# Patient Record
Sex: Female | Born: 1950 | Race: Black or African American | Hispanic: No | Smoking: Former smoker
Health system: Southern US, Community
[De-identification: ages and names within clinical notes are randomized; demographics above are authoritative.]

## PROBLEM LIST (undated history)

## (undated) DIAGNOSIS — K5792 Diverticulitis of intestine, part unspecified, without perforation or abscess without bleeding: Secondary | ICD-10-CM

## (undated) DIAGNOSIS — K6389 Other specified diseases of intestine: Secondary | ICD-10-CM

## (undated) DIAGNOSIS — I350 Nonrheumatic aortic (valve) stenosis: Secondary | ICD-10-CM

## (undated) DIAGNOSIS — R011 Cardiac murmur, unspecified: Secondary | ICD-10-CM

## (undated) DIAGNOSIS — Z951 Presence of aortocoronary bypass graft: Secondary | ICD-10-CM

## (undated) DIAGNOSIS — I10 Essential (primary) hypertension: Secondary | ICD-10-CM

## (undated) DIAGNOSIS — K219 Gastro-esophageal reflux disease without esophagitis: Secondary | ICD-10-CM

## (undated) DIAGNOSIS — K760 Fatty (change of) liver, not elsewhere classified: Secondary | ICD-10-CM

## (undated) DIAGNOSIS — M1990 Unspecified osteoarthritis, unspecified site: Secondary | ICD-10-CM

## (undated) DIAGNOSIS — I2510 Atherosclerotic heart disease of native coronary artery without angina pectoris: Secondary | ICD-10-CM

## (undated) DIAGNOSIS — G629 Polyneuropathy, unspecified: Secondary | ICD-10-CM

## (undated) DIAGNOSIS — D649 Anemia, unspecified: Secondary | ICD-10-CM

## (undated) DIAGNOSIS — N183 Chronic kidney disease, stage 3 unspecified: Secondary | ICD-10-CM

## (undated) DIAGNOSIS — E119 Type 2 diabetes mellitus without complications: Secondary | ICD-10-CM

## (undated) DIAGNOSIS — N189 Chronic kidney disease, unspecified: Secondary | ICD-10-CM

## (undated) DIAGNOSIS — E785 Hyperlipidemia, unspecified: Secondary | ICD-10-CM

## (undated) HISTORY — PX: BREAST BIOPSY: SHX20

## (undated) HISTORY — PX: ABDOMINAL HYSTERECTOMY: SHX81

## (undated) HISTORY — PX: CARDIAC SURGERY: SHX584

## (undated) HISTORY — PX: HERNIA REPAIR: SHX51

## (undated) HISTORY — PX: BREAST EXCISIONAL BIOPSY: SUR124

## (undated) HISTORY — PX: CORONARY ARTERY BYPASS GRAFT: SHX141

## (undated) HISTORY — PX: CARPAL TUNNEL RELEASE: SHX101

---

## 1997-09-28 ENCOUNTER — Other Ambulatory Visit: Admission: RE | Admit: 1997-09-28 | Discharge: 1997-09-28 | Payer: Self-pay | Admitting: Urology

## 1998-10-08 ENCOUNTER — Inpatient Hospital Stay (HOSPITAL_COMMUNITY): Admission: EM | Admit: 1998-10-08 | Discharge: 1998-10-10 | Payer: Self-pay | Admitting: Emergency Medicine

## 1998-10-08 ENCOUNTER — Encounter: Payer: Self-pay | Admitting: Emergency Medicine

## 1998-10-12 ENCOUNTER — Encounter: Admission: RE | Admit: 1998-10-12 | Discharge: 1998-10-12 | Payer: Self-pay | Admitting: Family Medicine

## 1999-03-13 ENCOUNTER — Other Ambulatory Visit: Admission: RE | Admit: 1999-03-13 | Discharge: 1999-03-13 | Payer: Self-pay | Admitting: Urology

## 1999-04-03 ENCOUNTER — Other Ambulatory Visit: Admission: RE | Admit: 1999-04-03 | Discharge: 1999-04-03 | Payer: Self-pay | Admitting: Family Medicine

## 1999-04-28 ENCOUNTER — Encounter: Admission: RE | Admit: 1999-04-28 | Discharge: 1999-04-28 | Payer: Self-pay | Admitting: Family Medicine

## 1999-12-05 ENCOUNTER — Encounter: Admission: RE | Admit: 1999-12-05 | Discharge: 1999-12-05 | Payer: Self-pay | Admitting: Family Medicine

## 1999-12-05 ENCOUNTER — Encounter: Payer: Self-pay | Admitting: Family Medicine

## 2000-08-19 ENCOUNTER — Other Ambulatory Visit: Admission: RE | Admit: 2000-08-19 | Discharge: 2000-08-19 | Payer: Self-pay | Admitting: Family Medicine

## 2000-12-17 ENCOUNTER — Encounter: Admission: RE | Admit: 2000-12-17 | Discharge: 2000-12-17 | Payer: Self-pay | Admitting: Family Medicine

## 2000-12-17 ENCOUNTER — Encounter: Payer: Self-pay | Admitting: Family Medicine

## 2002-02-17 ENCOUNTER — Encounter: Admission: RE | Admit: 2002-02-17 | Discharge: 2002-02-17 | Payer: Self-pay | Admitting: Family Medicine

## 2002-02-17 ENCOUNTER — Encounter: Payer: Self-pay | Admitting: Family Medicine

## 2002-02-21 ENCOUNTER — Other Ambulatory Visit: Admission: RE | Admit: 2002-02-21 | Discharge: 2002-02-21 | Payer: Self-pay | Admitting: Family Medicine

## 2003-04-06 ENCOUNTER — Encounter: Admission: RE | Admit: 2003-04-06 | Discharge: 2003-04-06 | Payer: Self-pay | Admitting: Family Medicine

## 2003-09-26 ENCOUNTER — Other Ambulatory Visit: Payer: Self-pay

## 2003-12-22 ENCOUNTER — Encounter: Admission: RE | Admit: 2003-12-22 | Discharge: 2003-12-22 | Payer: Self-pay | Admitting: Family Medicine

## 2003-12-28 ENCOUNTER — Emergency Department (HOSPITAL_COMMUNITY): Admission: EM | Admit: 2003-12-28 | Discharge: 2003-12-28 | Payer: Self-pay | Admitting: Emergency Medicine

## 2004-01-07 ENCOUNTER — Other Ambulatory Visit: Admission: RE | Admit: 2004-01-07 | Discharge: 2004-01-07 | Payer: Self-pay | Admitting: Family Medicine

## 2004-05-09 ENCOUNTER — Encounter: Admission: RE | Admit: 2004-05-09 | Discharge: 2004-05-09 | Payer: Self-pay | Admitting: Family Medicine

## 2005-02-03 ENCOUNTER — Other Ambulatory Visit: Admission: RE | Admit: 2005-02-03 | Discharge: 2005-02-03 | Payer: Self-pay | Admitting: Family Medicine

## 2005-03-25 ENCOUNTER — Other Ambulatory Visit: Payer: Self-pay

## 2005-03-25 ENCOUNTER — Inpatient Hospital Stay: Payer: Self-pay | Admitting: Internal Medicine

## 2005-03-26 ENCOUNTER — Other Ambulatory Visit: Payer: Self-pay

## 2005-06-12 ENCOUNTER — Encounter: Admission: RE | Admit: 2005-06-12 | Discharge: 2005-06-12 | Payer: Self-pay | Admitting: Family Medicine

## 2006-02-02 ENCOUNTER — Ambulatory Visit: Payer: Self-pay | Admitting: Gastroenterology

## 2006-05-06 ENCOUNTER — Other Ambulatory Visit: Admission: RE | Admit: 2006-05-06 | Discharge: 2006-05-06 | Payer: Self-pay | Admitting: Family Medicine

## 2006-09-28 ENCOUNTER — Ambulatory Visit: Payer: Self-pay

## 2007-10-06 ENCOUNTER — Other Ambulatory Visit: Admission: RE | Admit: 2007-10-06 | Discharge: 2007-10-06 | Payer: Self-pay | Admitting: Family Medicine

## 2007-10-20 ENCOUNTER — Ambulatory Visit: Payer: Self-pay

## 2008-10-09 ENCOUNTER — Ambulatory Visit: Payer: Self-pay

## 2008-10-22 ENCOUNTER — Ambulatory Visit: Payer: Self-pay

## 2009-07-01 ENCOUNTER — Ambulatory Visit: Payer: Self-pay | Admitting: Specialist

## 2009-10-24 ENCOUNTER — Ambulatory Visit: Payer: Self-pay

## 2010-10-29 ENCOUNTER — Ambulatory Visit: Payer: Self-pay | Admitting: Family Medicine

## 2011-07-03 ENCOUNTER — Ambulatory Visit: Payer: Self-pay | Admitting: Gastroenterology

## 2011-09-30 ENCOUNTER — Ambulatory Visit: Payer: Self-pay | Admitting: Ophthalmology

## 2011-09-30 DIAGNOSIS — I10 Essential (primary) hypertension: Secondary | ICD-10-CM

## 2011-10-14 ENCOUNTER — Ambulatory Visit: Payer: Self-pay | Admitting: Ophthalmology

## 2011-10-21 ENCOUNTER — Inpatient Hospital Stay: Payer: Self-pay | Admitting: Internal Medicine

## 2011-10-21 LAB — URINALYSIS, COMPLETE
Bilirubin,UR: NEGATIVE
Hyaline Cast: 34
Protein: 30
RBC,UR: 7 /HPF (ref 0–5)
Squamous Epithelial: 12
WBC UR: 34 /HPF (ref 0–5)

## 2011-10-21 LAB — COMPREHENSIVE METABOLIC PANEL
BUN: 29 mg/dL — ABNORMAL HIGH (ref 7–18)
Bilirubin,Total: 0.4 mg/dL (ref 0.2–1.0)
EGFR (Non-African Amer.): 24 — ABNORMAL LOW
Osmolality: 275 (ref 275–301)
SGPT (ALT): 49 U/L
Sodium: 134 mmol/L — ABNORMAL LOW (ref 136–145)
Total Protein: 8.4 g/dL — ABNORMAL HIGH (ref 6.4–8.2)

## 2011-10-21 LAB — CBC
HGB: 13.1 g/dL (ref 12.0–16.0)
MCH: 29.8 pg (ref 26.0–34.0)
MCHC: 32.5 g/dL (ref 32.0–36.0)
Platelet: 175 10*3/uL (ref 150–440)
RBC: 4.39 10*6/uL (ref 3.80–5.20)

## 2011-10-21 LAB — TROPONIN I: Troponin-I: <0.02 ng/mL

## 2011-10-21 LAB — CK TOTAL AND CKMB (NOT AT ARMC): CK-MB: 1.5 ng/mL (ref 0.5–3.6)

## 2011-10-22 DIAGNOSIS — I059 Rheumatic mitral valve disease, unspecified: Secondary | ICD-10-CM

## 2011-10-22 LAB — CK TOTAL AND CKMB (NOT AT ARMC)
CK, Total: 187 U/L (ref 21–215)
CK-MB: 1.6 ng/mL (ref 0.5–3.6)

## 2011-10-22 LAB — BASIC METABOLIC PANEL
BUN: 30 mg/dL — ABNORMAL HIGH (ref 7–18)
Calcium, Total: 8.1 mg/dL — ABNORMAL LOW (ref 8.5–10.1)
Creatinine: 1.38 mg/dL — ABNORMAL HIGH (ref 0.60–1.30)
Osmolality: 287 (ref 275–301)

## 2011-10-22 LAB — LIPID PANEL
Cholesterol: 109 mg/dL (ref 0–200)
HDL Cholesterol: 16 mg/dL — ABNORMAL LOW (ref 40–60)
Ldl Cholesterol, Calc: 48 mg/dL (ref 0–100)
VLDL Cholesterol, Calc: 45 mg/dL — ABNORMAL HIGH (ref 5–40)

## 2011-10-22 LAB — CBC WITH DIFFERENTIAL/PLATELET
Basophil #: 0 10*3/uL (ref 0.0–0.1)
Basophil %: 0.9 %
Eosinophil #: 0 10*3/uL (ref 0.0–0.7)
Eosinophil %: 0.7 %
Lymphocyte #: 2.1 10*3/uL (ref 1.0–3.6)
MCV: 91 fL (ref 80–100)

## 2011-10-22 LAB — HEMOGLOBIN A1C: Hemoglobin A1C: 6.2 % (ref 4.2–6.3)

## 2011-10-27 LAB — CULTURE, BLOOD (SINGLE)

## 2011-11-06 ENCOUNTER — Ambulatory Visit: Payer: Self-pay | Admitting: Family Medicine

## 2012-01-24 ENCOUNTER — Emergency Department: Payer: Self-pay | Admitting: *Deleted

## 2012-02-03 ENCOUNTER — Ambulatory Visit: Payer: Self-pay | Admitting: Ophthalmology

## 2012-02-17 ENCOUNTER — Ambulatory Visit: Payer: Self-pay | Admitting: Ophthalmology

## 2012-11-07 ENCOUNTER — Ambulatory Visit: Payer: Self-pay | Admitting: Family Medicine

## 2013-11-08 ENCOUNTER — Ambulatory Visit: Payer: Self-pay | Admitting: Family Medicine

## 2014-09-25 NOTE — Op Note (Signed)
PATIENT NAME:  Renee [name], Renee [name] MR#:  [id number] DATE OF BIRTH:  December 16, 1950  DATE OF PROCEDURE:  02/17/2012  PREOPERATIVE DIAGNOSIS:  Senile cataract, right eye.  POSTOPERATIVE DIAGNOSIS:  Senile cataract, right eye.  PROCEDURE:  Phacoemulsification with posterior chamber intraocular lens implantation of the right eye.  LENS:  ZCBOO 20.0-diopter posterior chamber intraocular lens.  ULTRASOUND TIME:  7% of 35 seconds. CDE 2.6.  SURGEON:  Mali Farah Lepak, MD  ANESTHESIA:  Topical with tetracaine drops and 2% Xylocaine jelly.  COMPLICATIONS:  None.  DESCRIPTION OF PROCEDURE:  The patient was identified in the holding room and transported to the operating room and placed in the supine position under the operating microscope.  The right eye was identified as the operative eye and it was prepped and draped in the usual sterile ophthalmic fashion.  A 1 millimeter clear-corneal paracentesis was made at the 1:30 position.  The anterior chamber was filled with Viscoat viscoelastic.  A 2.4 millimeter keratome was used to make a near-clear corneal incision at the 10:30 position.  A curvilinear capsulorrhexis was made with a cystotome and capsulorrhexis forceps.  Balanced salt solution was used to hydrodissect and hydrodelineate the nucleus.  Phacoemulsification was then used in horizontal chopping fashion to remove the lens nucleus and epinucleus.  The remaining cortex was then removed using the irrigation and aspiration handpiece. Provisc was then placed into the capsular bag to distend it for lens placement.  A ZCBOO 20.0-diopter lens was then injected into the capsular bag.  The remaining viscoelastic was aspirated.  Wounds were hydrated with balanced salt solution.  The anterior chamber was inflated to a physiologic pressure with balanced salt solution.  Miostat was placed into the anterior chamber to constrict the pupil.  No wound leaks were noted.  Topical Vigamox drops and Maxitrol ointment were  applied to the eye.  The patient was taken to the recovery room in stable condition without complications of anesthesia or surgery.  ____________________________ Wyonia Hough, MD crb:cbb D: 02/17/2012 14:41:34 ET T: 02/17/2012 15:06:08 ET JOB#: [id number]  cc: Wyonia Hough, MD, <Dictator> Leandrew Koyanagi MD ELECTRONICALLY SIGNED 02/24/2012 11:16

## 2014-09-30 NOTE — H&P (Signed)
PATIENT NAME:  Renee [name], Renee [name] MR#:  [id number] DATE OF BIRTH:  02/02/1951  DATE OF ADMISSION:  10/21/2011   ADMITTING PHYSICIAN: Gladstone Lighter, MD   PRIMARY MD: Janeece Riggers   PRIMARY CARDIOLOGIST: Dr. Clayborn Bigness    CHIEF COMPLAINT: Headache and dizziness. Was sent in [facility] for hypotension.   HISTORY OF PRESENT ILLNESS: Ms. Kantor is a 64 year old African American female with past medical history significant for coronary artery disease status post bypass graft surgery in 1996, history of hypertension, and non-insulin-dependent diabetes mellitus who had cataract surgery last Thursday about a week ago. She has been having some nausea, headache, and lightheadedness over the past three days. The patient says that she had decreased appetite. She has been nauseous, not drinking that much, that has gradually progressed to the point that she has been extremely weak so she called her ophthalmologist's office and got an appointment for today and went to see him in the office today. At the office she was extremely hypotensive that they were not able to get any manual blood pressure and was sent to the ED. In the ER, the patient's initial blood pressure was 58/37. Her lab work looked normal except for increased BUN and creatinine. She was on Coreg and lisinopril at home which she has been taking over the last couple of days. With 2 liters of fluid, blood pressure now improved up to 80 systolic. Her symptoms have improved a lot. She is sitting talking and not lightheaded. Denies any bleeding, not anemic. No fevers or chills. No white count. It looks more probably hypovolemia as the cause of her hypotension. The patient denies being on any new medications. No use of prednisone or other steroids in the past. No use of any pain medications recently.  PAST MEDICAL HISTORY:  1. Hypertension.  2. Diabetes mellitus.  3. Coronary artery disease status post bypass graft surgery in 1996, stable since then.    PAST SURGICAL HISTORY:  1. Carpal tunnel surgery.  2. Ventral hernia surgery.  3. Partial hysterectomy.  4. Coronary artery bypass graft surgery in 1996.  5. Tubal ligation.  6. Left eye cataract surgery done last week.   ALLERGIES TO MEDICATIONS: Intolerant to codeine which causes nausea.   MEDICATIONS AT HOME: She did not bring the list but was able to tell the medications, not able to tell the doses. 1. Coreg.  2. Metformin.  3. Aspirin.  4. Vitamin D3.  5. Omega-3 fatty acid capsule.  6. Lisinopril.   SOCIAL HISTORY: Lives at home by herself. Intermittent smoker, refuses to give any number. No history of any alcohol or drug use.   FAMILY HISTORY: Father with arthritis. Mom died young during childbirth.  REVIEW OF SYSTEMS: CONSTITUTIONAL: No fever, fatigue. Positive for generalized weakness today. EYES: Status post left eye cataract surgery last week with improvement in vision, still has blurred vision in right eye. No glaucoma. ENT: No tinnitus or ear pain. Positive for moderate hearing loss. No epistaxis or discharge. RESPIRATORY: No cough, wheeze, hemoptysis, or COPD. CARDIOVASCULAR: Denies any chest pain, orthopnea, edema, or palpitations. GI: No nausea, vomiting, diarrhea, abdominal pain, hematemesis, or melena. GU: No dysuria, hematuria. No decreased or increased frequency of urination. ENDOCRINE: No polyuria, nocturia, thyroid problems, heat or cold intolerance. HEMATOLOGY: No anemia, easy bruising or bleeding. SKIN: No acne, rash, or lesions. MUSCULOSKELETAL: No neck pain, shoulder pain, back pain, arthritis, or gout. NEUROLOGIC: No numbness, weakness, CVA, TIA, or seizures. PSYCHOLOGICAL: No anxiety, insomnia, or depression.  PHYSICAL EXAMINATION:   VITAL SIGNS: Temperature 97 degrees Fahrenheit, pulse 96, respirations 20, blood pressure 58/37, after 2 liters of IV fluids blood pressure currently is 83/55, pulse oximetry 95% on room air.   GENERAL: Well built, well  nourished female sitting in bed currently not in any acute distress.   HEENT: Normocephalic, atraumatic. Pupils equal, round, reacting to light. Anicteric sclerae. Extraocular movements intact. Oropharynx clear without erythema, mass, or exudates.   NECK: Supple. No thyromegaly, JVD, or carotid bruits. No lymphadenopathy.   LUNGS: Clear to auscultation bilaterally. No wheeze or crackles. No use of accessory muscles for breathing.   CARDIOVASCULAR: S1, S2 regular rate and rhythm. 3/6 systolic murmur present. No rubs or gallops.   ABDOMEN: Soft, nontender, nondistended. No hepatosplenomegaly. Normal bowel sounds.   EXTREMITIES: No pedal edema. No clubbing or cyanosis. 2+ dorsalis pedis pulses palpable bilaterally.   SKIN: No acne, rash, or lesions.   LYMPHATICS: No cervical lymphadenopathy.   NEUROLOGIC: Cranial nerves intact. No focal motor or sensory deficits.   PSYCH: The patient is awake, alert, oriented x3.   LABORATORY DATA: WBC 5.7, hemoglobin 13.1, hematocrit 40.3, platelet count 175, sodium 134, potassium 3.8, chloride 100, bicarb 24, BUN 29, creatinine 2.1, glucose 110, calcium 9.3, ALT 49, AST 61, alkaline phosphatase 72, total bilirubin 0.4, albumin 3.5. Magnesium 2.0. CK 202. CK-MB 1.5. Troponin less than 0.02.   Chest x-ray showing clear lung fields. No acute disease of the chest. Normal cardiomediastinal silhouette.   CT of the head without contrast showing no acute intracranial process present.   EKG showing normal sinus rhythm, heart rate of 83. No acute ST or T wave abnormalities seen. Nonspecific T wave inversions seen in lead V1 and V2.   ASSESSMENT AND PLAN: This is a 64 year old female with past medical history of hypertension, non-insulin-dependent diabetes mellitus, and coronary artery disease status post bypass graft surgery in 1996 who was sent from ophthalmologist's office for hypotension, unable to get manual blood pressure. She was found to be having a blood  pressure of 58/37 and also acute renal failure here in the hospital.  1. Hypotension. Possible cause could be volume depletion and she continued to take her blood pressure medications. Less likely to be sepsis as no white count, no fevers, and no specific symptoms. She has been nauseous with poor p.o. intake for three days now since her cataract surgery. Blood pressure was improving with fluids. Continue on one more liter of normal saline fluid bolus and start fluids at 125 mL/hour. Will hold off on admission to CCU at this time. Will admit her to telemetry and closely monitor. Hold off on any pressors as patient is asymptomatic at this time. Try to keep the MAP greater than 55. Also, check blood pressure on the other arm to make sure there is no subclavian stenosis on that side. Cortisol level has been ordered to rule out adrenal insufficiency, however, electrolytes are normal. Less likely to be having any active bleeding as she is not anemic. Will recheck labs again.  2. Acute renal failure, likely cause prerenal and continuing to take her lisinopril and metformin. Hold nephrotoxins. Continue IV fluids and follow up in a.m. She will need strict I's and O's monitoring. Get renal ultrasound.  3. Coronary artery disease status post bypass graft surgery, appears stable other than hypotension at this point. Recycle cardiac enzymes. Will be monitored on telemetry. Get an echocardiogram. Does not look like any cardiac tamponade. If persistently hypotensive, not responding to fluids  and has chest pain, need to get a CT of the chest to rule out pericardial effusion with cardiac tamponade.  4. Diabetes mellitus. Hold metformin at this time. Sliding scale insulin and HbA1c.  5. Hypertension. Hold lisinopril and Coreg.  6. GI and DVT prophylaxis with Protonix and TED stockings.   CODE STATUS: FULL CODE.   TIME SPENT ON ADMISSION: 50 minutes.  ____________________________ Gladstone Lighter,  MD rk:drc D: 10/21/2011 21:17:25 ET T: 10/22/2011 07:52:51 ET JOB#: [id number]  cc: Gladstone Lighter, MD, <Dictator> Dwayne D. Clayborn Bigness, MD Gladstone Lighter MD ELECTRONICALLY SIGNED 10/22/2011 13:07

## 2014-09-30 NOTE — Op Note (Signed)
PATIENT NAME:  Renee [name], Renee [name] MR#:  [id number] DATE OF BIRTH:  08-25-1950  DATE OF PROCEDURE:  10/14/2011  PREOPERATIVE DIAGNOSIS:  Senile cataract left eye.  POSTOPERATIVE DIAGNOSIS:  Senile cataract left eye.  PROCEDURE:  Phacoemulsification with posterior chamber intraocular lens implantation of the left eye.  LENS:  ZCB00 20.0-diopter posterior chamber intraocular lens.  ULTRASOUND TIME:  7% of 43 seconds. CDE 2.9.  SURGEON:  Mali Olsen Mccutchan, MD  ANESTHESIA:  Topical with tetracaine drops and 2% Xylocaine jelly.  COMPLICATIONS:  None.  DESCRIPTION OF PROCEDURE:  The patient was identified in the holding room and transported to the operating room and placed in the supine position under the operating microscope.  The left eye was identified as the operative eye and it was prepped and draped in the usual sterile ophthalmic fashion.  A 1 millimeter clear-corneal paracentesis was made at the 1:30 position.  The anterior chamber was filled with Viscoat viscoelastic.  A 2.4 millimeter keratome was used to make a near-clear corneal incision at the 10:30 position.  A curvilinear capsulorrhexis was made with a cystotome and capsulorrhexis forceps.  Balanced salt solution was used to hydrodissect and hydrodelineate the nucleus.  Phacoemulsification was then used in horizontal chopping fashion to remove the lens nucleus and epinucleus.  The remaining cortex was then removed using the irrigation and aspiration handpiece. Provisc was then placed into the capsular bag to distend it for lens placement.  An ZCB00 20.0-diopter lens was then injected into the capsular bag.  The remaining viscoelastic was aspirated.  Wounds were hydrated with balanced salt solution.  The anterior chamber was inflated to a physiologic pressure with balanced salt solution.  Miostat was placed into the anterior chamber to constrict the pupil.  No wound leaks were noted.  Topical Vigamox drops and Maxitrol ointment were applied  to the eye.  The patient was taken to the recovery room in stable condition without complications of anesthesia or surgery.  ____________________________ Wyonia Hough, MD crb:cms D: 10/14/2011 14:17:26 ET T: 10/14/2011 14:42:11 ET JOB#: [id number]  cc: Wyonia Hough, MD, <Dictator> Leandrew Koyanagi MD ELECTRONICALLY SIGNED 10/27/2011 11:32

## 2014-09-30 NOTE — Discharge Summary (Signed)
PATIENT NAME:  Renee [name], Renee [name] MR#:  [id number] DATE OF BIRTH:  1951-01-18  DATE OF ADMISSION:  10/21/2011 DATE OF DISCHARGE:  10/22/2011  ADMISSION DIAGNOSIS: Hypotension.  DISCHARGE DIAGNOSES:  1. Hypotension secondary to volume depletion.  2. Acute renal failure, resolved, secondary to volume depletion.  3. Diabetes.  4. History of coronary artery disease status post CABG and heart murmur.  5. Hypertension.   LABS/STUDIES: Echocardiogram showed ejection fraction of 55%. RVSP is normal. Mild valvular aortic stenosis.   Discharge white blood cells 5.3, hemoglobin 11, hematocrit 34, and platelets 136. Sodium 141, potassium 3.5, chloride 108, bicarbonate 25, BUN 30, creatinine 1.38, glucose 85, and calcium 8.1.   Hemoglobin A1c 6.2. Cholesterol 109, triglycerides 225, HDL 16, VLDL 45, and LDL 48. Troponins are negative.   Blood cultures negative to date.   CT of the head without contrast showed no acute intracranial hemorrhage or cerebrovascular accident.   HISTORY OF PRESENT ILLNESS: The patient is a 64 year old female who presented with hypotension. For further details, please refer to the history and physical.  1. Hypotension in the setting of poor p.o. intake, volume depletion. The patient was fluid resuscitated. Her blood pressure medications were on hold. Her blood pressure is back to normal.  2. Acute renal failure, likely prerenal. Also taking lisinopril, HCTZ, and metformin. Improved with IV fluids. Suspect this is due to volume depletion. We did not need a renal ultrasound as her creatinine did improve.  3. History of coronary artery disease status post coronary artery bypass graft and murmur on heart examination. Echocardiogram with the results above. She has mild aortic stenosis murmur and follows up with Dr. Clayborn Bigness.  4. Diabetes. The patient may resume her metformin. Her A1c was 6.2.  5. Hypertension. We will hold lisinopril/HCTZ until seen by her PCP.  I will continue  Coreg. 6. Upper respiratory tract. The patient will be treated for a UTI with ciprofloxacin.  DISCHARGE MEDICATIONS:  1. Coreg 25 mg twice a day. 2. Crestor 20 mg daily.  3. Metformin 500 mg twice a day. 4. Aspirin 81 mg daily.  5. Omega-3 three times daily. 6. Ciprofloxacin 500 mg twice a day for six days.  7. Vitamin D3 daily.   NOTE: Do not take lisinopril/HCTZ until seen by PCP.   DISCHARGE DIET: Low sodium, ADA diet.   DISCHARGE ACTIVITY: As tolerated.   DISCHARGE FOLLOWUP: The patient will need a follow-up with Dr. Lisbeth Ply in 2 to 3 days.   TIME SPENT ON DISCHARGE: 35 minutes.  ____________________________ Donell Beers. Benjie Karvonen, MD spm:slb D: 10/22/2011 12:07:45 ET T: 10/22/2011 12:54:55 ET JOB#: [id number]  cc: Natnael Biederman P. Benjie Karvonen, MD, <Dictator> Maura L. Hamrick, MD [place] Brennen Gardiner MD ELECTRONICALLY SIGNED 10/22/2011 21:08

## 2014-10-05 ENCOUNTER — Other Ambulatory Visit: Payer: Self-pay | Admitting: Family Medicine

## 2014-10-05 DIAGNOSIS — G4452 New daily persistent headache (NDPH): Secondary | ICD-10-CM

## 2014-10-11 ENCOUNTER — Ambulatory Visit
Admission: RE | Admit: 2014-10-11 | Discharge: 2014-10-11 | Disposition: A | Discharge status: Home / Self Care | Payer: PPO | Source: Ambulatory Visit | Attending: Family Medicine | Admitting: Family Medicine

## 2014-10-11 DIAGNOSIS — R51 Headache: Secondary | ICD-10-CM | POA: Insufficient documentation

## 2014-10-11 DIAGNOSIS — G4452 New daily persistent headache (NDPH): Secondary | ICD-10-CM

## 2014-10-16 ENCOUNTER — Other Ambulatory Visit: Payer: Self-pay

## 2014-10-16 DIAGNOSIS — Z1231 Encounter for screening mammogram for malignant neoplasm of breast: Secondary | ICD-10-CM

## 2014-11-12 ENCOUNTER — Ambulatory Visit
Admission: RE | Admit: 2014-11-12 | Discharge: 2014-11-12 | Disposition: A | Discharge status: Home / Self Care | Payer: PPO | Source: Ambulatory Visit | Attending: Family Medicine | Admitting: Family Medicine

## 2014-11-12 DIAGNOSIS — R928 Other abnormal and inconclusive findings on diagnostic imaging of breast: Secondary | ICD-10-CM | POA: Insufficient documentation

## 2014-11-12 DIAGNOSIS — Z1231 Encounter for screening mammogram for malignant neoplasm of breast: Secondary | ICD-10-CM | POA: Diagnosis present

## 2014-11-14 ENCOUNTER — Other Ambulatory Visit: Payer: Self-pay | Admitting: Family Medicine

## 2014-11-14 DIAGNOSIS — R928 Other abnormal and inconclusive findings on diagnostic imaging of breast: Secondary | ICD-10-CM

## 2014-11-14 DIAGNOSIS — N63 Unspecified lump in unspecified breast: Secondary | ICD-10-CM

## 2014-11-19 ENCOUNTER — Ambulatory Visit: Payer: PPO

## 2014-11-19 ENCOUNTER — Ambulatory Visit
Admission: RE | Admit: 2014-11-19 | Discharge: 2014-11-19 | Disposition: A | Discharge status: Home / Self Care | Payer: PPO | Source: Ambulatory Visit | Attending: Family Medicine | Admitting: Family Medicine

## 2014-11-19 DIAGNOSIS — R928 Other abnormal and inconclusive findings on diagnostic imaging of breast: Secondary | ICD-10-CM | POA: Diagnosis present

## 2014-11-19 DIAGNOSIS — N63 Unspecified lump in unspecified breast: Secondary | ICD-10-CM

## 2015-07-23 DIAGNOSIS — R296 Repeated falls: Secondary | ICD-10-CM | POA: Diagnosis not present

## 2015-07-23 DIAGNOSIS — I10 Essential (primary) hypertension: Secondary | ICD-10-CM | POA: Diagnosis not present

## 2015-07-23 DIAGNOSIS — E784 Other hyperlipidemia: Secondary | ICD-10-CM | POA: Diagnosis not present

## 2015-07-23 DIAGNOSIS — G629 Polyneuropathy, unspecified: Secondary | ICD-10-CM | POA: Diagnosis not present

## 2015-07-23 DIAGNOSIS — E119 Type 2 diabetes mellitus without complications: Secondary | ICD-10-CM | POA: Diagnosis not present

## 2015-07-23 DIAGNOSIS — R55 Syncope and collapse: Secondary | ICD-10-CM | POA: Diagnosis not present

## 2015-07-23 DIAGNOSIS — K219 Gastro-esophageal reflux disease without esophagitis: Secondary | ICD-10-CM | POA: Diagnosis not present

## 2015-07-23 DIAGNOSIS — R011 Cardiac murmur, unspecified: Secondary | ICD-10-CM | POA: Diagnosis not present

## 2015-07-23 DIAGNOSIS — E669 Obesity, unspecified: Secondary | ICD-10-CM | POA: Diagnosis not present

## 2015-07-23 DIAGNOSIS — I2510 Atherosclerotic heart disease of native coronary artery without angina pectoris: Secondary | ICD-10-CM | POA: Diagnosis not present

## 2015-08-08 DIAGNOSIS — I10 Essential (primary) hypertension: Secondary | ICD-10-CM | POA: Diagnosis not present

## 2015-08-08 DIAGNOSIS — E1129 Type 2 diabetes mellitus with other diabetic kidney complication: Secondary | ICD-10-CM | POA: Diagnosis not present

## 2015-08-08 DIAGNOSIS — Z6831 Body mass index (BMI) 31.0-31.9, adult: Secondary | ICD-10-CM | POA: Diagnosis not present

## 2015-08-08 DIAGNOSIS — E7800 Pure hypercholesterolemia, unspecified: Secondary | ICD-10-CM | POA: Diagnosis not present

## 2015-08-08 DIAGNOSIS — Z720 Tobacco use: Secondary | ICD-10-CM | POA: Diagnosis not present

## 2015-08-08 DIAGNOSIS — E1140 Type 2 diabetes mellitus with diabetic neuropathy, unspecified: Secondary | ICD-10-CM | POA: Diagnosis not present

## 2015-08-08 DIAGNOSIS — E669 Obesity, unspecified: Secondary | ICD-10-CM | POA: Diagnosis not present

## 2015-10-11 DIAGNOSIS — Z6831 Body mass index (BMI) 31.0-31.9, adult: Secondary | ICD-10-CM | POA: Diagnosis not present

## 2015-10-11 DIAGNOSIS — M25562 Pain in left knee: Secondary | ICD-10-CM | POA: Diagnosis not present

## 2015-10-11 DIAGNOSIS — Z1389 Encounter for screening for other disorder: Secondary | ICD-10-CM | POA: Diagnosis not present

## 2015-10-11 DIAGNOSIS — J019 Acute sinusitis, unspecified: Secondary | ICD-10-CM | POA: Diagnosis not present

## 2015-10-11 DIAGNOSIS — Z1231 Encounter for screening mammogram for malignant neoplasm of breast: Secondary | ICD-10-CM | POA: Diagnosis not present

## 2015-10-11 DIAGNOSIS — Z139 Encounter for screening, unspecified: Secondary | ICD-10-CM | POA: Diagnosis not present

## 2015-10-11 DIAGNOSIS — E2839 Other primary ovarian failure: Secondary | ICD-10-CM | POA: Diagnosis not present

## 2015-10-14 ENCOUNTER — Other Ambulatory Visit: Payer: Self-pay | Admitting: Family Medicine

## 2015-10-14 DIAGNOSIS — E2839 Other primary ovarian failure: Secondary | ICD-10-CM

## 2015-10-14 DIAGNOSIS — Z1231 Encounter for screening mammogram for malignant neoplasm of breast: Secondary | ICD-10-CM

## 2015-10-23 DIAGNOSIS — M1712 Unilateral primary osteoarthritis, left knee: Secondary | ICD-10-CM | POA: Diagnosis not present

## 2015-10-23 DIAGNOSIS — M25862 Other specified joint disorders, left knee: Secondary | ICD-10-CM | POA: Diagnosis not present

## 2015-11-21 ENCOUNTER — Ambulatory Visit
Admission: RE | Admit: 2015-11-21 | Discharge: 2015-11-21 | Disposition: A | Discharge status: Home / Self Care | Payer: PPO | Source: Ambulatory Visit | Attending: Family Medicine | Admitting: Family Medicine

## 2015-11-21 ENCOUNTER — Other Ambulatory Visit: Payer: Self-pay | Admitting: Family Medicine

## 2015-11-21 DIAGNOSIS — Z1231 Encounter for screening mammogram for malignant neoplasm of breast: Secondary | ICD-10-CM

## 2015-11-21 DIAGNOSIS — Z13820 Encounter for screening for osteoporosis: Secondary | ICD-10-CM | POA: Diagnosis not present

## 2015-11-21 DIAGNOSIS — E2839 Other primary ovarian failure: Secondary | ICD-10-CM | POA: Insufficient documentation

## 2015-12-12 DIAGNOSIS — Z720 Tobacco use: Secondary | ICD-10-CM | POA: Diagnosis not present

## 2015-12-12 DIAGNOSIS — Z6831 Body mass index (BMI) 31.0-31.9, adult: Secondary | ICD-10-CM | POA: Diagnosis not present

## 2015-12-12 DIAGNOSIS — E7800 Pure hypercholesterolemia, unspecified: Secondary | ICD-10-CM | POA: Diagnosis not present

## 2015-12-12 DIAGNOSIS — Z79899 Other long term (current) drug therapy: Secondary | ICD-10-CM | POA: Diagnosis not present

## 2015-12-12 DIAGNOSIS — Z23 Encounter for immunization: Secondary | ICD-10-CM | POA: Diagnosis not present

## 2015-12-12 DIAGNOSIS — E1140 Type 2 diabetes mellitus with diabetic neuropathy, unspecified: Secondary | ICD-10-CM | POA: Diagnosis not present

## 2015-12-12 DIAGNOSIS — I10 Essential (primary) hypertension: Secondary | ICD-10-CM | POA: Diagnosis not present

## 2015-12-30 DIAGNOSIS — E119 Type 2 diabetes mellitus without complications: Secondary | ICD-10-CM | POA: Diagnosis not present

## 2016-01-21 DIAGNOSIS — I10 Essential (primary) hypertension: Secondary | ICD-10-CM | POA: Diagnosis not present

## 2016-01-21 DIAGNOSIS — K219 Gastro-esophageal reflux disease without esophagitis: Secondary | ICD-10-CM | POA: Diagnosis not present

## 2016-01-21 DIAGNOSIS — I2510 Atherosclerotic heart disease of native coronary artery without angina pectoris: Secondary | ICD-10-CM | POA: Diagnosis not present

## 2016-01-21 DIAGNOSIS — E119 Type 2 diabetes mellitus without complications: Secondary | ICD-10-CM | POA: Diagnosis not present

## 2016-01-21 DIAGNOSIS — G629 Polyneuropathy, unspecified: Secondary | ICD-10-CM | POA: Diagnosis not present

## 2016-01-21 DIAGNOSIS — R011 Cardiac murmur, unspecified: Secondary | ICD-10-CM | POA: Diagnosis not present

## 2016-01-21 DIAGNOSIS — E784 Other hyperlipidemia: Secondary | ICD-10-CM | POA: Diagnosis not present

## 2016-01-21 DIAGNOSIS — R296 Repeated falls: Secondary | ICD-10-CM | POA: Diagnosis not present

## 2016-01-21 DIAGNOSIS — E669 Obesity, unspecified: Secondary | ICD-10-CM | POA: Diagnosis not present

## 2016-03-17 DIAGNOSIS — E669 Obesity, unspecified: Secondary | ICD-10-CM | POA: Diagnosis not present

## 2016-03-17 DIAGNOSIS — Z2821 Immunization not carried out because of patient refusal: Secondary | ICD-10-CM | POA: Diagnosis not present

## 2016-03-17 DIAGNOSIS — I10 Essential (primary) hypertension: Secondary | ICD-10-CM | POA: Diagnosis not present

## 2016-03-17 DIAGNOSIS — E1140 Type 2 diabetes mellitus with diabetic neuropathy, unspecified: Secondary | ICD-10-CM | POA: Diagnosis not present

## 2016-03-17 DIAGNOSIS — Z720 Tobacco use: Secondary | ICD-10-CM | POA: Diagnosis not present

## 2016-03-17 DIAGNOSIS — Z6832 Body mass index (BMI) 32.0-32.9, adult: Secondary | ICD-10-CM | POA: Diagnosis not present

## 2016-03-17 DIAGNOSIS — Z79899 Other long term (current) drug therapy: Secondary | ICD-10-CM | POA: Diagnosis not present

## 2016-03-17 DIAGNOSIS — E1129 Type 2 diabetes mellitus with other diabetic kidney complication: Secondary | ICD-10-CM | POA: Diagnosis not present

## 2016-03-17 DIAGNOSIS — M79672 Pain in left foot: Secondary | ICD-10-CM | POA: Diagnosis not present

## 2016-03-17 DIAGNOSIS — E7800 Pure hypercholesterolemia, unspecified: Secondary | ICD-10-CM | POA: Diagnosis not present

## 2016-05-23 ENCOUNTER — Encounter: Payer: Self-pay | Admitting: Emergency Medicine

## 2016-05-23 ENCOUNTER — Emergency Department: Payer: No Typology Code available for payment source

## 2016-05-23 ENCOUNTER — Emergency Department
Admission: EM | Admit: 2016-05-23 | Discharge: 2016-05-23 | Disposition: A | Discharge status: Home / Self Care | Payer: No Typology Code available for payment source | Attending: Emergency Medicine | Admitting: Emergency Medicine

## 2016-05-23 DIAGNOSIS — S069X9A Unspecified intracranial injury with loss of consciousness of unspecified duration, initial encounter: Secondary | ICD-10-CM | POA: Diagnosis not present

## 2016-05-23 DIAGNOSIS — Y92410 Unspecified street and highway as the place of occurrence of the external cause: Secondary | ICD-10-CM | POA: Insufficient documentation

## 2016-05-23 DIAGNOSIS — Y999 Unspecified external cause status: Secondary | ICD-10-CM | POA: Diagnosis not present

## 2016-05-23 DIAGNOSIS — E119 Type 2 diabetes mellitus without complications: Secondary | ICD-10-CM | POA: Insufficient documentation

## 2016-05-23 DIAGNOSIS — S161XXA Strain of muscle, fascia and tendon at neck level, initial encounter: Secondary | ICD-10-CM | POA: Diagnosis not present

## 2016-05-23 DIAGNOSIS — S199XXA Unspecified injury of neck, initial encounter: Secondary | ICD-10-CM | POA: Diagnosis not present

## 2016-05-23 DIAGNOSIS — Y9389 Activity, other specified: Secondary | ICD-10-CM | POA: Insufficient documentation

## 2016-05-23 DIAGNOSIS — I10 Essential (primary) hypertension: Secondary | ICD-10-CM | POA: Insufficient documentation

## 2016-05-23 DIAGNOSIS — M542 Cervicalgia: Secondary | ICD-10-CM | POA: Diagnosis not present

## 2016-05-23 DIAGNOSIS — R55 Syncope and collapse: Secondary | ICD-10-CM | POA: Diagnosis not present

## 2016-05-23 DIAGNOSIS — F17200 Nicotine dependence, unspecified, uncomplicated: Secondary | ICD-10-CM | POA: Diagnosis not present

## 2016-05-23 HISTORY — DX: Essential (primary) hypertension: I10

## 2016-05-23 HISTORY — DX: Type 2 diabetes mellitus without complications: E11.9

## 2016-05-23 MED ORDER — CELECOXIB 200 MG PO CAPS: 200.0000 mg | ORAL_CAPSULE | Freq: Every day | ORAL | 2 refills | Status: AC
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
## 2016-07-03 DIAGNOSIS — Z1211 Encounter for screening for malignant neoplasm of colon: Secondary | ICD-10-CM | POA: Diagnosis not present

## 2016-07-03 DIAGNOSIS — E669 Obesity, unspecified: Secondary | ICD-10-CM | POA: Diagnosis not present

## 2016-07-03 DIAGNOSIS — I10 Essential (primary) hypertension: Secondary | ICD-10-CM | POA: Diagnosis not present

## 2016-07-03 DIAGNOSIS — E1129 Type 2 diabetes mellitus with other diabetic kidney complication: Secondary | ICD-10-CM | POA: Diagnosis not present

## 2016-07-03 DIAGNOSIS — E7800 Pure hypercholesterolemia, unspecified: Secondary | ICD-10-CM | POA: Diagnosis not present

## 2016-07-03 DIAGNOSIS — Z9181 History of falling: Secondary | ICD-10-CM | POA: Diagnosis not present

## 2016-07-03 DIAGNOSIS — Z6831 Body mass index (BMI) 31.0-31.9, adult: Secondary | ICD-10-CM | POA: Diagnosis not present

## 2016-07-21 DIAGNOSIS — G629 Polyneuropathy, unspecified: Secondary | ICD-10-CM | POA: Diagnosis not present

## 2016-07-21 DIAGNOSIS — E784 Other hyperlipidemia: Secondary | ICD-10-CM | POA: Diagnosis not present

## 2016-07-21 DIAGNOSIS — Z7689 Persons encountering health services in other specified circumstances: Secondary | ICD-10-CM | POA: Diagnosis not present

## 2016-07-21 DIAGNOSIS — E669 Obesity, unspecified: Secondary | ICD-10-CM | POA: Diagnosis not present

## 2016-07-21 DIAGNOSIS — E119 Type 2 diabetes mellitus without complications: Secondary | ICD-10-CM | POA: Diagnosis not present

## 2016-07-21 DIAGNOSIS — R55 Syncope and collapse: Secondary | ICD-10-CM | POA: Diagnosis not present

## 2016-07-21 DIAGNOSIS — R011 Cardiac murmur, unspecified: Secondary | ICD-10-CM | POA: Diagnosis not present

## 2016-07-21 DIAGNOSIS — R296 Repeated falls: Secondary | ICD-10-CM | POA: Diagnosis not present

## 2016-07-21 DIAGNOSIS — I2510 Atherosclerotic heart disease of native coronary artery without angina pectoris: Secondary | ICD-10-CM | POA: Diagnosis not present

## 2016-07-21 DIAGNOSIS — K219 Gastro-esophageal reflux disease without esophagitis: Secondary | ICD-10-CM | POA: Diagnosis not present

## 2016-07-21 DIAGNOSIS — I10 Essential (primary) hypertension: Secondary | ICD-10-CM | POA: Diagnosis not present

## 2016-07-27 DIAGNOSIS — I10 Essential (primary) hypertension: Secondary | ICD-10-CM | POA: Diagnosis not present

## 2016-07-27 DIAGNOSIS — E7800 Pure hypercholesterolemia, unspecified: Secondary | ICD-10-CM | POA: Diagnosis not present

## 2016-07-27 DIAGNOSIS — G63 Polyneuropathy in diseases classified elsewhere: Secondary | ICD-10-CM | POA: Insufficient documentation

## 2016-07-27 DIAGNOSIS — G629 Polyneuropathy, unspecified: Secondary | ICD-10-CM | POA: Diagnosis not present

## 2016-07-27 DIAGNOSIS — E1142 Type 2 diabetes mellitus with diabetic polyneuropathy: Secondary | ICD-10-CM | POA: Diagnosis not present

## 2016-07-27 DIAGNOSIS — I2510 Atherosclerotic heart disease of native coronary artery without angina pectoris: Secondary | ICD-10-CM | POA: Diagnosis not present

## 2016-07-28 DIAGNOSIS — E7800 Pure hypercholesterolemia, unspecified: Secondary | ICD-10-CM | POA: Insufficient documentation

## 2016-07-28 DIAGNOSIS — E1142 Type 2 diabetes mellitus with diabetic polyneuropathy: Secondary | ICD-10-CM | POA: Diagnosis present

## 2016-07-28 DIAGNOSIS — I2510 Atherosclerotic heart disease of native coronary artery without angina pectoris: Secondary | ICD-10-CM | POA: Diagnosis present

## 2016-09-30 DIAGNOSIS — K219 Gastro-esophageal reflux disease without esophagitis: Secondary | ICD-10-CM | POA: Diagnosis not present

## 2016-09-30 DIAGNOSIS — Z8371 Family history of colonic polyps: Secondary | ICD-10-CM | POA: Diagnosis not present

## 2016-10-26 DIAGNOSIS — Z1231 Encounter for screening mammogram for malignant neoplasm of breast: Secondary | ICD-10-CM | POA: Diagnosis not present

## 2016-10-26 DIAGNOSIS — I10 Essential (primary) hypertension: Secondary | ICD-10-CM | POA: Diagnosis not present

## 2016-10-26 DIAGNOSIS — Z0000 Encounter for general adult medical examination without abnormal findings: Secondary | ICD-10-CM | POA: Diagnosis not present

## 2016-10-26 DIAGNOSIS — E1142 Type 2 diabetes mellitus with diabetic polyneuropathy: Secondary | ICD-10-CM | POA: Diagnosis not present

## 2016-10-26 DIAGNOSIS — E7800 Pure hypercholesterolemia, unspecified: Secondary | ICD-10-CM | POA: Diagnosis not present

## 2016-10-28 DIAGNOSIS — M1712 Unilateral primary osteoarthritis, left knee: Secondary | ICD-10-CM | POA: Diagnosis not present

## 2016-10-29 DIAGNOSIS — M1712 Unilateral primary osteoarthritis, left knee: Secondary | ICD-10-CM | POA: Diagnosis not present

## 2016-11-09 ENCOUNTER — Other Ambulatory Visit: Payer: Self-pay | Admitting: Internal Medicine

## 2016-11-09 DIAGNOSIS — Z1231 Encounter for screening mammogram for malignant neoplasm of breast: Secondary | ICD-10-CM

## 2016-11-25 ENCOUNTER — Ambulatory Visit
Admission: RE | Admit: 2016-11-25 | Discharge: 2016-11-25 | Disposition: A | Discharge status: Home / Self Care | Payer: PPO | Source: Ambulatory Visit | Attending: Internal Medicine | Admitting: Internal Medicine

## 2016-11-25 DIAGNOSIS — Z1231 Encounter for screening mammogram for malignant neoplasm of breast: Secondary | ICD-10-CM | POA: Insufficient documentation

## 2017-01-14 DIAGNOSIS — H26492 Other secondary cataract, left eye: Secondary | ICD-10-CM | POA: Diagnosis not present

## 2017-01-19 DIAGNOSIS — R0989 Other specified symptoms and signs involving the circulatory and respiratory systems: Secondary | ICD-10-CM | POA: Diagnosis not present

## 2017-01-19 DIAGNOSIS — I10 Essential (primary) hypertension: Secondary | ICD-10-CM | POA: Diagnosis not present

## 2017-01-19 DIAGNOSIS — E784 Other hyperlipidemia: Secondary | ICD-10-CM | POA: Diagnosis not present

## 2017-01-19 DIAGNOSIS — I2510 Atherosclerotic heart disease of native coronary artery without angina pectoris: Secondary | ICD-10-CM | POA: Diagnosis not present

## 2017-01-19 DIAGNOSIS — G629 Polyneuropathy, unspecified: Secondary | ICD-10-CM | POA: Diagnosis not present

## 2017-01-19 DIAGNOSIS — R0602 Shortness of breath: Secondary | ICD-10-CM | POA: Diagnosis not present

## 2017-01-19 DIAGNOSIS — E669 Obesity, unspecified: Secondary | ICD-10-CM | POA: Diagnosis not present

## 2017-01-19 DIAGNOSIS — R296 Repeated falls: Secondary | ICD-10-CM | POA: Diagnosis not present

## 2017-01-19 DIAGNOSIS — K219 Gastro-esophageal reflux disease without esophagitis: Secondary | ICD-10-CM | POA: Diagnosis not present

## 2017-01-19 DIAGNOSIS — R55 Syncope and collapse: Secondary | ICD-10-CM | POA: Diagnosis not present

## 2017-01-19 DIAGNOSIS — R011 Cardiac murmur, unspecified: Secondary | ICD-10-CM | POA: Diagnosis not present

## 2017-01-26 DIAGNOSIS — I10 Essential (primary) hypertension: Secondary | ICD-10-CM | POA: Diagnosis not present

## 2017-01-26 DIAGNOSIS — E1142 Type 2 diabetes mellitus with diabetic polyneuropathy: Secondary | ICD-10-CM | POA: Diagnosis not present

## 2017-01-26 DIAGNOSIS — E7800 Pure hypercholesterolemia, unspecified: Secondary | ICD-10-CM | POA: Diagnosis not present

## 2017-02-02 DIAGNOSIS — I2510 Atherosclerotic heart disease of native coronary artery without angina pectoris: Secondary | ICD-10-CM | POA: Diagnosis not present

## 2017-02-02 DIAGNOSIS — I10 Essential (primary) hypertension: Secondary | ICD-10-CM | POA: Diagnosis present

## 2017-02-02 DIAGNOSIS — E1142 Type 2 diabetes mellitus with diabetic polyneuropathy: Secondary | ICD-10-CM | POA: Diagnosis not present

## 2017-02-02 DIAGNOSIS — E7800 Pure hypercholesterolemia, unspecified: Secondary | ICD-10-CM | POA: Diagnosis not present

## 2017-02-24 ENCOUNTER — Ambulatory Visit
Admission: RE | Admit: 2017-02-24 | Discharge: 2017-02-24 | Disposition: A | Discharge status: Home / Self Care | Payer: PPO | Source: Ambulatory Visit | Attending: Internal Medicine | Admitting: Internal Medicine

## 2017-02-24 ENCOUNTER — Encounter
Admission: RE | Disposition: A | Discharge status: Home / Self Care | Payer: Self-pay | Source: Ambulatory Visit | Attending: Internal Medicine

## 2017-02-24 ENCOUNTER — Ambulatory Visit: Payer: PPO | Admitting: Registered Nurse

## 2017-02-24 ENCOUNTER — Encounter: Payer: Self-pay | Admitting: *Deleted

## 2017-02-24 DIAGNOSIS — D123 Benign neoplasm of transverse colon: Secondary | ICD-10-CM | POA: Insufficient documentation

## 2017-02-24 DIAGNOSIS — Z1211 Encounter for screening for malignant neoplasm of colon: Secondary | ICD-10-CM | POA: Insufficient documentation

## 2017-02-24 DIAGNOSIS — K648 Other hemorrhoids: Secondary | ICD-10-CM | POA: Diagnosis not present

## 2017-02-24 DIAGNOSIS — Z7984 Long term (current) use of oral hypoglycemic drugs: Secondary | ICD-10-CM | POA: Diagnosis not present

## 2017-02-24 DIAGNOSIS — E119 Type 2 diabetes mellitus without complications: Secondary | ICD-10-CM | POA: Insufficient documentation

## 2017-02-24 DIAGNOSIS — K635 Polyp of colon: Secondary | ICD-10-CM | POA: Diagnosis not present

## 2017-02-24 DIAGNOSIS — Z86010 Personal history of colonic polyps: Secondary | ICD-10-CM | POA: Insufficient documentation

## 2017-02-24 DIAGNOSIS — F17200 Nicotine dependence, unspecified, uncomplicated: Secondary | ICD-10-CM | POA: Insufficient documentation

## 2017-02-24 DIAGNOSIS — Z8371 Family history of colonic polyps: Secondary | ICD-10-CM | POA: Diagnosis not present

## 2017-02-24 DIAGNOSIS — K641 Second degree hemorrhoids: Secondary | ICD-10-CM | POA: Diagnosis not present

## 2017-02-24 DIAGNOSIS — I10 Essential (primary) hypertension: Secondary | ICD-10-CM | POA: Insufficient documentation

## 2017-02-24 DIAGNOSIS — Z7982 Long term (current) use of aspirin: Secondary | ICD-10-CM | POA: Insufficient documentation

## 2017-02-24 DIAGNOSIS — Z79899 Other long term (current) drug therapy: Secondary | ICD-10-CM | POA: Insufficient documentation

## 2017-02-24 DIAGNOSIS — Z6832 Body mass index (BMI) 32.0-32.9, adult: Secondary | ICD-10-CM | POA: Diagnosis not present

## 2017-02-24 HISTORY — PX: COLONOSCOPY: SHX5424

## 2017-02-24 LAB — GLUCOSE, CAPILLARY: GLUCOSE-CAPILLARY: 103 mg/dL — AB (ref 65–99)

## 2017-02-24 SURGERY — COLONOSCOPY
Anesthesia: General

## 2017-02-24 MED ORDER — PROPOFOL 500 MG/50ML IV EMUL
INTRAVENOUS | Status: DC | PRN
  Administered 2017-02-24: 150 ug/kg/min via INTRAVENOUS

## 2017-02-24 MED ORDER — PROPOFOL 10 MG/ML IV BOLUS
INTRAVENOUS | Status: DC | PRN
  Administered 2017-02-24: 40 mg via INTRAVENOUS

## 2017-02-24 MED ORDER — PROPOFOL 10 MG/ML IV BOLUS
INTRAVENOUS | Status: AC
  Filled 2017-02-24: qty 40

## 2017-02-24 MED ORDER — MIDAZOLAM HCL 2 MG/2ML IJ SOLN
INTRAMUSCULAR | Status: AC
  Filled 2017-02-24: qty 2

## 2017-02-24 MED ORDER — MIDAZOLAM HCL 2 MG/2ML IJ SOLN
INTRAMUSCULAR | Status: DC | PRN
  Administered 2017-02-24: 2 mg via INTRAVENOUS

## 2017-02-24 MED ORDER — LIDOCAINE HCL (CARDIAC) 20 MG/ML IV SOLN
INTRAVENOUS | Status: DC | PRN
  Administered 2017-02-24: 40 mg via INTRAVENOUS

## 2017-02-24 MED ORDER — SODIUM CHLORIDE 0.9 % IV SOLN
INTRAVENOUS | Status: DC
  Administered 2017-02-24: 09:00:00 via INTRAVENOUS

## 2017-02-24 MED ORDER — LIDOCAINE HCL (PF) 2 % IJ SOLN
INTRAMUSCULAR | Status: AC
  Filled 2017-02-24: qty 2

## 2017-02-24 NOTE — Interval H&P Note (Signed)
History and Physical Interval Note:  02/24/2017 9:41 AM  Renee [name]  has presented today for surgery, with the diagnosis of COLON CANCER SCREENING  The various methods of treatment have been discussed with the patient and family. After consideration of risks, benefits and other options for treatment, the patient has consented to  Procedure(s): COLONOSCOPY (N/A) as a surgical intervention .  The patient's history has been reviewed, patient examined, no change in status, stable for surgery.  I have reviewed the patient's chart and labs.  Questions were answered to the patient's satisfaction.     [place], [place]

## 2017-02-24 NOTE — Anesthesia Procedure Notes (Signed)
Performed by: Doreen Salvage Pre-anesthesia Checklist: Patient identified, Emergency Drugs available, Suction available and Patient being monitored Patient Re-evaluated:Patient Re-evaluated prior to induction Oxygen Delivery Method: Nasal cannula Induction Type: IV induction Dental Injury: Teeth and Oropharynx as per pre-operative assessment  Comments: Nasal cannula with etCO2 monitoring

## 2017-02-24 NOTE — Anesthesia Preprocedure Evaluation (Signed)
Anesthesia Evaluation  Patient identified by MRN, date of birth, ID band Patient awake    Reviewed: Allergy & Precautions, H&P , NPO status , Patient's Chart, lab work & pertinent test results, reviewed documented beta blocker date and time   Airway Mallampati: II   Neck ROM: full    Dental  (+) Poor Dentition   Pulmonary neg pulmonary ROS, Current Smoker,    Pulmonary exam normal        Cardiovascular Exercise Tolerance: Poor hypertension, On Medications negative cardio ROS Normal cardiovascular exam Rhythm:regular Rate:Normal     Neuro/Psych negative neurological ROS  negative psych ROS   GI/Hepatic negative GI ROS, Neg liver ROS,   Endo/Other  negative endocrine ROSdiabetes, Well Controlled, Type 2, Oral Hypoglycemic AgentsMorbid obesity  Renal/GU negative Renal ROS  negative genitourinary   Musculoskeletal   Abdominal   Peds  Hematology negative hematology ROS (+)   Anesthesia Other Findings Past Medical History: No date: Diabetes mellitus without complication (HCC) No date: Hypertension Past Surgical History: No date: ABDOMINAL HYSTERECTOMY 20+ yrs ago: BREAST BIOPSY; Right     Comment:  EXCISIONAL No date: CARDIAC SURGERY     Comment:  cabg No date: CORONARY ARTERY BYPASS GRAFT     Comment:  1996 BMI    Body Mass Index:  32.77 kg/m     Reproductive/Obstetrics negative OB ROS                             Anesthesia Physical Anesthesia Plan  ASA: III  Anesthesia Plan: General   Post-op Pain Management:    Induction:   PONV Risk Score and Plan:   Airway Management Planned:   Additional Equipment:   Intra-op Plan:   Post-operative Plan:   Informed Consent: I have reviewed the patients History and Physical, chart, labs and discussed the procedure including the risks, benefits and alternatives for the proposed anesthesia with the patient or authorized  representative who has indicated his/her understanding and acceptance.   Dental Advisory Given  Plan Discussed with: CRNA  Anesthesia Plan Comments:         Anesthesia Quick Evaluation

## 2017-02-24 NOTE — H&P (Signed)
  Outpatient short stay form Pre-procedure 02/24/2017 9:37 AM Renee [name], M.D.  Primary Physician: Glendon Axe, M.D.  Reason for visit:  Personal hx of colon polyps  History of present illness:  Patient has a personal hx of colon polyps, followed previously by Dr. Gustavo Lah of our group. Patient denies any change in bowel habits, rectal bleeding or weight loss. She takes no blood thinners. She has no abdominal pain.     Current Facility-Administered Medications:  .  0.9 %  sodium chloride infusion, , Intravenous, Continuous, [place], Benay Pike, MD, Last Rate: 20 mL/hr at 02/24/17 2122  Prescriptions Prior to Admission  Medication Sig Dispense Refill Last Dose  . ascorbic acid (VITAMIN C) 500 MG tablet Take 500 mg by mouth daily.   Past Week at Unknown time  . aspirin EC 81 MG tablet Take 81 mg by mouth daily.   02/23/2017 at Unknown time  . carvedilol (COREG) 6.25 MG tablet Take 12.5 mg by mouth 2 (two) times daily with a meal.   02/24/2017 at 0530  . celecoxib (CELEBREX) 200 MG capsule Take 1 capsule (200 mg total) by mouth daily. 30 capsule 2 02/23/2017 at Unknown time  . cholecalciferol (VITAMIN D) 1000 units tablet Take 2,000 Units by mouth daily.   Past Week at Unknown time  . furosemide (LASIX) 20 MG tablet Take 20 mg by mouth daily.   02/23/2017 at Unknown time  . gabapentin (NEURONTIN) 800 MG tablet Take 800 mg by mouth 3 (three) times daily.   02/23/2017 at Unknown time  . lisinopril (PRINIVIL,ZESTRIL) 10 MG tablet Take 10 mg by mouth daily.   02/24/2017 at 0530  . metFORMIN (GLUCOPHAGE) 500 MG tablet Take 500 mg by mouth daily with supper.   02/23/2017 at Unknown time     Allergies  Allergen Reactions  . Codeine Nausea And Vomiting  . Tramadol Nausea And Vomiting     Past Medical History:  Diagnosis Date  . Diabetes mellitus without complication ([place])   . Hypertension     Review of systems:    Negative for SOB, CP. GI ROS as in HPI.    Physical Exam  Gen: NAD.  Appears comfortable.  HEENT: [place]/AT. PERRLA. Normal external ear exam.  Chest: CTA, no wheezes.  CV: RR nl S1, S2. No gallops.  Abd: soft, nt, nd. BS+  Ext: no edema. Pulses 2+  Neuro: Alert and oriented. Judgement appears normal. Nonfocal.     Planned procedures: Colonoscopy. The patient understands the nature of the planned procedure, indications, risks, alternatives and potential complications including but not limited to bleeding, infection, perforation, damage to internal organs and possible oversedation/side effects from anesthesia. The patient agrees and gives consent to proceed.  See procedure report for disposition and further recommendations.    Renee [name], M.D. Gastroenterology 02/24/2017  9:37 AM

## 2017-02-24 NOTE — Transfer of Care (Signed)
Immediate Anesthesia Transfer of Care Note  Patient: Renee [name]  Procedure(s) Performed: Procedure(s): COLONOSCOPY (N/A)  Patient Location: PACU  Anesthesia Type:General  Level of Consciousness: sedated  Airway & Oxygen Therapy: Patient Spontanous Breathing and Patient connected to face mask oxygen  Post-op Assessment: Report given to RN and Post -op Vital signs reviewed and stable  Post vital signs: Reviewed and stable  Last Vitals:  Vitals:   02/24/17 0836 02/24/17 1010  BP: 126/67 107/61  Pulse: 66 67  Resp: 16 (!) 23  Temp: (!) 36.3 C (!) 36.1 C  SpO2: 294% 765%    Complications: No apparent anesthesia complications

## 2017-02-24 NOTE — Op Note (Signed)
[facility] Gastroenterology Patient Name: Renee [name] Procedure Date: 02/24/2017 9:35 AM MRN: [id number] Account #: [id number] Date of Birth: 03-18-1951 Admit Type: Outpatient Age: 66 Room: [facility] ENDO ROOM 1 Gender: Female Note Status: Finalized Procedure:            Colonoscopy Indications:          High risk colon cancer surveillance: Personal history                        of colonic polyps, Last colonoscopy: January 2013 Providers:            Benay Pike. [place] Referring MD:         Glendon Axe (Referring MD) Medicines:            Propofol per Anesthesia Complications:        No immediate complications. Procedure:            Pre-Anesthesia Assessment:                       - ASA Grade Assessment: III - A patient with severe                        systemic disease.                       - After reviewing the risks and benefits, the patient                        was deemed in satisfactory condition to undergo the                        procedure.                       - The anesthesia plan was to use moderate                        sedation/analgesia (conscious sedation).                       - The anesthesia plan was to use monitored anesthesia                        care (MAC).                       - Immediately prior to administration of medications,                        the patient was re-assessed for adequacy to receive                        sedatives.                       - Sedation was administered by an anesthesia                        professional. The sedation level attained was moderate.                       After obtaining informed consent, the colonoscope was  passed under direct vision. Throughout the procedure,                        the patient's blood pressure, pulse, and oxygen                        saturations were monitored continuously. The Olympus                        CF-H180AL colonoscope ( S#:  [id number] ) was introduced                        through the anus and advanced to the the cecum,                        identified by appendiceal orifice and ileocecal valve.                        The colonoscopy was performed without difficulty. The                        patient tolerated the procedure well. The quality of                        the bowel preparation was good. Findings:      The digital rectal exam was normal. Pertinent negatives include normal       sphincter tone.      Two sessile polyps were found in the proximal transverse colon. The       polyps were 4 to 5 mm in size.      Non-bleeding internal hemorrhoids were found during retroflexion. The       hemorrhoids were Grade II (internal hemorrhoids that prolapse but reduce       spontaneously). Impression:           - Two 4 to 5 mm polyps in the proximal transverse colon.                       - No specimens collected. Recommendation:       - Repeat colonoscopy date to be determined after                        pending pathology results are reviewed for surveillance.                       - Return to GI office PRN.                       - Discharge patient to home (with escort).                       - Advance diet as tolerated.                       - Continue present medications. Procedure Code(s):    --- Professional ---                       D2202, Colorectal cancer screening; colonoscopy on  individual at high risk Diagnosis Code(s):    --- Professional ---                       Z86.010, Personal history of colonic polyps                       D12.3, Benign neoplasm of transverse colon (hepatic                        flexure or splenic flexure) CPT copyright 2016 American Medical Association. All rights reserved. The codes documented in this report are preliminary and upon coder review may  be revised to meet current compliance requirements. Efrain Sella MD, MD 02/24/2017 10:37:28  AM This report has been signed electronically. Number of Addenda: 0 Note Initiated On: 02/24/2017 9:35 AM Scope Withdrawal Time: 0 hours 6 minutes 35 seconds  Total Procedure Duration: 0 hours 19 minutes 33 seconds       [facility]

## 2017-02-24 NOTE — Anesthesia Post-op Follow-up Note (Signed)
Anesthesia QCDR form completed.        

## 2017-02-25 ENCOUNTER — Encounter: Payer: Self-pay | Admitting: Internal Medicine

## 2017-02-25 LAB — SURGICAL PATHOLOGY

## 2017-02-25 NOTE — Anesthesia Postprocedure Evaluation (Signed)
Anesthesia Post Note  Patient: Remi Haggard  Procedure(s) Performed: Procedure(s) (LRB): COLONOSCOPY (N/A)  Patient location during evaluation: PACU Anesthesia Type: General Level of consciousness: awake and alert Pain management: pain level controlled Vital Signs Assessment: post-procedure vital signs reviewed and stable Respiratory status: spontaneous breathing, nonlabored ventilation, respiratory function stable and patient connected to nasal cannula oxygen Cardiovascular status: blood pressure returned to baseline and stable Postop Assessment: no apparent nausea or vomiting Anesthetic complications: no     Last Vitals:  Vitals:   02/24/17 1040 02/24/17 1050  BP: 135/74 132/82  Pulse: 63 66  Resp: (!) 23 16  Temp:    SpO2: 100% 99%    Last Pain:  Vitals:   02/25/17 0752  TempSrc:   PainSc: 0-No pain                 Molli Barrows

## 2017-05-11 DIAGNOSIS — M1712 Unilateral primary osteoarthritis, left knee: Secondary | ICD-10-CM | POA: Diagnosis not present

## 2017-05-11 DIAGNOSIS — Z0000 Encounter for general adult medical examination without abnormal findings: Secondary | ICD-10-CM | POA: Diagnosis not present

## 2017-05-11 DIAGNOSIS — E1142 Type 2 diabetes mellitus with diabetic polyneuropathy: Secondary | ICD-10-CM | POA: Diagnosis not present

## 2017-05-11 DIAGNOSIS — I10 Essential (primary) hypertension: Secondary | ICD-10-CM | POA: Diagnosis not present

## 2017-05-11 DIAGNOSIS — Z1231 Encounter for screening mammogram for malignant neoplasm of breast: Secondary | ICD-10-CM | POA: Diagnosis not present

## 2017-05-12 ENCOUNTER — Other Ambulatory Visit: Payer: Self-pay | Admitting: Internal Medicine

## 2017-05-12 DIAGNOSIS — Z1239 Encounter for other screening for malignant neoplasm of breast: Secondary | ICD-10-CM

## 2017-07-21 DIAGNOSIS — J208 Acute bronchitis due to other specified organisms: Secondary | ICD-10-CM | POA: Diagnosis not present

## 2017-07-21 DIAGNOSIS — B9689 Other specified bacterial agents as the cause of diseases classified elsewhere: Secondary | ICD-10-CM | POA: Diagnosis not present

## 2017-07-26 DIAGNOSIS — R0602 Shortness of breath: Secondary | ICD-10-CM | POA: Diagnosis not present

## 2017-07-26 DIAGNOSIS — I10 Essential (primary) hypertension: Secondary | ICD-10-CM | POA: Diagnosis not present

## 2017-07-26 DIAGNOSIS — E1142 Type 2 diabetes mellitus with diabetic polyneuropathy: Secondary | ICD-10-CM | POA: Diagnosis not present

## 2017-07-26 DIAGNOSIS — G629 Polyneuropathy, unspecified: Secondary | ICD-10-CM | POA: Diagnosis not present

## 2017-07-26 DIAGNOSIS — R55 Syncope and collapse: Secondary | ICD-10-CM | POA: Diagnosis not present

## 2017-07-26 DIAGNOSIS — I2510 Atherosclerotic heart disease of native coronary artery without angina pectoris: Secondary | ICD-10-CM | POA: Diagnosis not present

## 2017-07-26 DIAGNOSIS — E7800 Pure hypercholesterolemia, unspecified: Secondary | ICD-10-CM | POA: Diagnosis not present

## 2017-07-26 DIAGNOSIS — F17200 Nicotine dependence, unspecified, uncomplicated: Secondary | ICD-10-CM | POA: Diagnosis not present

## 2017-07-26 DIAGNOSIS — R011 Cardiac murmur, unspecified: Secondary | ICD-10-CM | POA: Diagnosis not present

## 2017-08-04 DIAGNOSIS — E1142 Type 2 diabetes mellitus with diabetic polyneuropathy: Secondary | ICD-10-CM | POA: Diagnosis not present

## 2017-08-04 DIAGNOSIS — I10 Essential (primary) hypertension: Secondary | ICD-10-CM | POA: Diagnosis not present

## 2017-08-20 DIAGNOSIS — D649 Anemia, unspecified: Secondary | ICD-10-CM | POA: Diagnosis not present

## 2017-08-20 DIAGNOSIS — M1712 Unilateral primary osteoarthritis, left knee: Secondary | ICD-10-CM | POA: Diagnosis not present

## 2017-08-20 DIAGNOSIS — M25462 Effusion, left knee: Secondary | ICD-10-CM | POA: Diagnosis not present

## 2017-08-30 DIAGNOSIS — M1712 Unilateral primary osteoarthritis, left knee: Secondary | ICD-10-CM | POA: Diagnosis not present

## 2017-11-18 DIAGNOSIS — D649 Anemia, unspecified: Secondary | ICD-10-CM | POA: Diagnosis not present

## 2017-11-25 DIAGNOSIS — M545 Low back pain: Secondary | ICD-10-CM | POA: Diagnosis not present

## 2017-11-25 DIAGNOSIS — G8929 Other chronic pain: Secondary | ICD-10-CM | POA: Diagnosis not present

## 2017-12-07 ENCOUNTER — Ambulatory Visit
Admission: RE | Admit: 2017-12-07 | Discharge: 2017-12-07 | Disposition: A | Discharge status: Home / Self Care | Payer: PPO | Source: Ambulatory Visit | Attending: Internal Medicine | Admitting: Internal Medicine

## 2017-12-07 DIAGNOSIS — Z1231 Encounter for screening mammogram for malignant neoplasm of breast: Secondary | ICD-10-CM | POA: Insufficient documentation

## 2017-12-07 DIAGNOSIS — Z1239 Encounter for other screening for malignant neoplasm of breast: Secondary | ICD-10-CM

## 2018-01-17 DIAGNOSIS — E669 Obesity, unspecified: Secondary | ICD-10-CM | POA: Diagnosis not present

## 2018-01-17 DIAGNOSIS — E7800 Pure hypercholesterolemia, unspecified: Secondary | ICD-10-CM | POA: Diagnosis not present

## 2018-01-17 DIAGNOSIS — R011 Cardiac murmur, unspecified: Secondary | ICD-10-CM | POA: Diagnosis not present

## 2018-01-17 DIAGNOSIS — I10 Essential (primary) hypertension: Secondary | ICD-10-CM | POA: Diagnosis not present

## 2018-01-17 DIAGNOSIS — G629 Polyneuropathy, unspecified: Secondary | ICD-10-CM | POA: Diagnosis not present

## 2018-01-17 DIAGNOSIS — R0602 Shortness of breath: Secondary | ICD-10-CM | POA: Diagnosis not present

## 2018-01-17 DIAGNOSIS — R55 Syncope and collapse: Secondary | ICD-10-CM | POA: Diagnosis not present

## 2018-01-17 DIAGNOSIS — F17200 Nicotine dependence, unspecified, uncomplicated: Secondary | ICD-10-CM | POA: Diagnosis not present

## 2018-01-17 DIAGNOSIS — R0989 Other specified symptoms and signs involving the circulatory and respiratory systems: Secondary | ICD-10-CM | POA: Diagnosis not present

## 2018-01-17 DIAGNOSIS — E7849 Other hyperlipidemia: Secondary | ICD-10-CM | POA: Diagnosis not present

## 2018-01-17 DIAGNOSIS — I2510 Atherosclerotic heart disease of native coronary artery without angina pectoris: Secondary | ICD-10-CM | POA: Diagnosis not present

## 2018-01-17 DIAGNOSIS — E1142 Type 2 diabetes mellitus with diabetic polyneuropathy: Secondary | ICD-10-CM | POA: Diagnosis not present

## 2018-02-28 DIAGNOSIS — Z0000 Encounter for general adult medical examination without abnormal findings: Secondary | ICD-10-CM | POA: Diagnosis not present

## 2018-02-28 DIAGNOSIS — M545 Low back pain: Secondary | ICD-10-CM | POA: Diagnosis not present

## 2018-02-28 DIAGNOSIS — G8929 Other chronic pain: Secondary | ICD-10-CM | POA: Insufficient documentation

## 2018-02-28 DIAGNOSIS — E1142 Type 2 diabetes mellitus with diabetic polyneuropathy: Secondary | ICD-10-CM | POA: Diagnosis not present

## 2018-05-16 DIAGNOSIS — E1142 Type 2 diabetes mellitus with diabetic polyneuropathy: Secondary | ICD-10-CM | POA: Diagnosis not present

## 2018-05-23 DIAGNOSIS — M1712 Unilateral primary osteoarthritis, left knee: Secondary | ICD-10-CM | POA: Diagnosis not present

## 2018-05-23 DIAGNOSIS — E119 Type 2 diabetes mellitus without complications: Secondary | ICD-10-CM | POA: Diagnosis not present

## 2018-05-23 DIAGNOSIS — E1142 Type 2 diabetes mellitus with diabetic polyneuropathy: Secondary | ICD-10-CM | POA: Diagnosis not present

## 2018-05-23 DIAGNOSIS — N183 Chronic kidney disease, stage 3 (moderate): Secondary | ICD-10-CM | POA: Diagnosis not present

## 2018-06-29 DIAGNOSIS — G8929 Other chronic pain: Secondary | ICD-10-CM | POA: Diagnosis not present

## 2018-06-29 DIAGNOSIS — M25562 Pain in left knee: Secondary | ICD-10-CM | POA: Diagnosis not present

## 2018-06-29 DIAGNOSIS — M1712 Unilateral primary osteoarthritis, left knee: Secondary | ICD-10-CM | POA: Diagnosis not present

## 2018-06-29 DIAGNOSIS — M25462 Effusion, left knee: Secondary | ICD-10-CM | POA: Diagnosis not present

## 2018-07-04 DIAGNOSIS — M1712 Unilateral primary osteoarthritis, left knee: Secondary | ICD-10-CM | POA: Diagnosis not present

## 2018-07-04 DIAGNOSIS — G8929 Other chronic pain: Secondary | ICD-10-CM | POA: Diagnosis not present

## 2018-07-04 DIAGNOSIS — M25562 Pain in left knee: Secondary | ICD-10-CM | POA: Diagnosis not present

## 2018-07-11 DIAGNOSIS — M1712 Unilateral primary osteoarthritis, left knee: Secondary | ICD-10-CM | POA: Diagnosis not present

## 2018-07-11 DIAGNOSIS — M25562 Pain in left knee: Secondary | ICD-10-CM | POA: Diagnosis not present

## 2018-07-11 DIAGNOSIS — G8929 Other chronic pain: Secondary | ICD-10-CM | POA: Diagnosis not present

## 2018-07-11 DIAGNOSIS — M25462 Effusion, left knee: Secondary | ICD-10-CM | POA: Diagnosis not present

## 2018-07-18 DIAGNOSIS — M25562 Pain in left knee: Secondary | ICD-10-CM | POA: Diagnosis not present

## 2018-07-18 DIAGNOSIS — M25462 Effusion, left knee: Secondary | ICD-10-CM | POA: Diagnosis not present

## 2018-07-18 DIAGNOSIS — M1712 Unilateral primary osteoarthritis, left knee: Secondary | ICD-10-CM | POA: Diagnosis not present

## 2018-07-18 DIAGNOSIS — G8929 Other chronic pain: Secondary | ICD-10-CM | POA: Diagnosis not present

## 2018-07-19 DIAGNOSIS — I2510 Atherosclerotic heart disease of native coronary artery without angina pectoris: Secondary | ICD-10-CM | POA: Diagnosis not present

## 2018-07-19 DIAGNOSIS — Z87891 Personal history of nicotine dependence: Secondary | ICD-10-CM | POA: Diagnosis not present

## 2018-07-19 DIAGNOSIS — I10 Essential (primary) hypertension: Secondary | ICD-10-CM | POA: Diagnosis not present

## 2018-07-19 DIAGNOSIS — R0602 Shortness of breath: Secondary | ICD-10-CM | POA: Diagnosis not present

## 2018-07-19 DIAGNOSIS — R011 Cardiac murmur, unspecified: Secondary | ICD-10-CM | POA: Diagnosis not present

## 2018-07-19 DIAGNOSIS — R55 Syncope and collapse: Secondary | ICD-10-CM | POA: Diagnosis not present

## 2018-07-19 DIAGNOSIS — E7800 Pure hypercholesterolemia, unspecified: Secondary | ICD-10-CM | POA: Diagnosis not present

## 2018-07-19 DIAGNOSIS — I25118 Atherosclerotic heart disease of native coronary artery with other forms of angina pectoris: Secondary | ICD-10-CM | POA: Diagnosis not present

## 2018-07-19 DIAGNOSIS — E1142 Type 2 diabetes mellitus with diabetic polyneuropathy: Secondary | ICD-10-CM | POA: Diagnosis not present

## 2018-07-19 DIAGNOSIS — G629 Polyneuropathy, unspecified: Secondary | ICD-10-CM | POA: Diagnosis not present

## 2018-07-20 ENCOUNTER — Other Ambulatory Visit: Payer: Self-pay | Admitting: Sports Medicine

## 2018-07-20 DIAGNOSIS — M25562 Pain in left knee: Principal | ICD-10-CM

## 2018-07-20 DIAGNOSIS — G8929 Other chronic pain: Secondary | ICD-10-CM

## 2018-07-20 DIAGNOSIS — M1712 Unilateral primary osteoarthritis, left knee: Secondary | ICD-10-CM

## 2018-07-20 DIAGNOSIS — M25462 Effusion, left knee: Secondary | ICD-10-CM

## 2018-08-02 ENCOUNTER — Ambulatory Visit
Admission: RE | Admit: 2018-08-02 | Discharge: 2018-08-02 | Disposition: A | Discharge status: Home / Self Care | Payer: PPO | Source: Ambulatory Visit | Attending: Sports Medicine | Admitting: Sports Medicine

## 2018-08-02 DIAGNOSIS — M25462 Effusion, left knee: Secondary | ICD-10-CM

## 2018-08-02 DIAGNOSIS — G8929 Other chronic pain: Secondary | ICD-10-CM | POA: Insufficient documentation

## 2018-08-02 DIAGNOSIS — M1712 Unilateral primary osteoarthritis, left knee: Secondary | ICD-10-CM | POA: Diagnosis not present

## 2018-08-02 DIAGNOSIS — M25562 Pain in left knee: Secondary | ICD-10-CM | POA: Diagnosis not present

## 2018-08-03 DIAGNOSIS — G8929 Other chronic pain: Secondary | ICD-10-CM | POA: Diagnosis not present

## 2018-08-03 DIAGNOSIS — M25562 Pain in left knee: Secondary | ICD-10-CM | POA: Diagnosis not present

## 2018-08-03 DIAGNOSIS — M1712 Unilateral primary osteoarthritis, left knee: Secondary | ICD-10-CM | POA: Diagnosis not present

## 2018-08-04 ENCOUNTER — Other Ambulatory Visit: Payer: Self-pay | Admitting: Orthopedic Surgery

## 2018-08-04 DIAGNOSIS — M25562 Pain in left knee: Principal | ICD-10-CM

## 2018-08-04 DIAGNOSIS — G8929 Other chronic pain: Secondary | ICD-10-CM

## 2018-08-12 ENCOUNTER — Ambulatory Visit
Admission: RE | Admit: 2018-08-12 | Discharge: 2018-08-12 | Disposition: A | Discharge status: Home / Self Care | Payer: PPO | Source: Ambulatory Visit | Attending: Orthopedic Surgery | Admitting: Orthopedic Surgery

## 2018-08-12 DIAGNOSIS — G8929 Other chronic pain: Secondary | ICD-10-CM | POA: Diagnosis not present

## 2018-08-12 DIAGNOSIS — M25562 Pain in left knee: Secondary | ICD-10-CM | POA: Insufficient documentation

## 2018-08-12 DIAGNOSIS — M1712 Unilateral primary osteoarthritis, left knee: Secondary | ICD-10-CM | POA: Diagnosis not present

## 2018-08-23 DIAGNOSIS — N183 Chronic kidney disease, stage 3 (moderate): Secondary | ICD-10-CM | POA: Diagnosis not present

## 2018-08-24 DIAGNOSIS — M1712 Unilateral primary osteoarthritis, left knee: Secondary | ICD-10-CM | POA: Diagnosis not present

## 2018-09-02 ENCOUNTER — Other Ambulatory Visit: Payer: PPO

## 2018-09-05 DIAGNOSIS — E7800 Pure hypercholesterolemia, unspecified: Secondary | ICD-10-CM | POA: Diagnosis not present

## 2018-09-05 DIAGNOSIS — I10 Essential (primary) hypertension: Secondary | ICD-10-CM | POA: Diagnosis not present

## 2018-09-05 DIAGNOSIS — G8929 Other chronic pain: Secondary | ICD-10-CM | POA: Diagnosis not present

## 2018-09-05 DIAGNOSIS — I2510 Atherosclerotic heart disease of native coronary artery without angina pectoris: Secondary | ICD-10-CM | POA: Diagnosis not present

## 2018-09-05 DIAGNOSIS — N183 Chronic kidney disease, stage 3 (moderate): Secondary | ICD-10-CM | POA: Diagnosis not present

## 2018-09-05 DIAGNOSIS — E1142 Type 2 diabetes mellitus with diabetic polyneuropathy: Secondary | ICD-10-CM | POA: Diagnosis not present

## 2018-09-05 DIAGNOSIS — M545 Low back pain: Secondary | ICD-10-CM | POA: Diagnosis not present

## 2018-09-30 ENCOUNTER — Inpatient Hospital Stay: Admission: RE | Admit: 2018-09-30 | Payer: PPO | Source: Ambulatory Visit

## 2018-10-11 ENCOUNTER — Inpatient Hospital Stay: Admit: 2018-10-11 | Payer: PPO | Admitting: Orthopedic Surgery

## 2018-10-11 SURGERY — ARTHROPLASTY, KNEE, TOTAL
Anesthesia: Choice | Laterality: Left

## 2018-10-21 ENCOUNTER — Other Ambulatory Visit: Payer: Self-pay

## 2018-10-21 ENCOUNTER — Encounter
Admission: RE | Admit: 2018-10-21 | Discharge: 2018-10-21 | Disposition: A | Discharge status: Home / Self Care | Payer: PPO | Source: Ambulatory Visit | Attending: Orthopedic Surgery | Admitting: Orthopedic Surgery

## 2018-10-21 DIAGNOSIS — R9431 Abnormal electrocardiogram [ECG] [EKG]: Secondary | ICD-10-CM | POA: Insufficient documentation

## 2018-10-21 DIAGNOSIS — I10 Essential (primary) hypertension: Secondary | ICD-10-CM | POA: Insufficient documentation

## 2018-10-21 DIAGNOSIS — Z01818 Encounter for other preprocedural examination: Secondary | ICD-10-CM | POA: Diagnosis not present

## 2018-10-21 DIAGNOSIS — I517 Cardiomegaly: Secondary | ICD-10-CM | POA: Diagnosis not present

## 2018-10-21 DIAGNOSIS — Z1159 Encounter for screening for other viral diseases: Secondary | ICD-10-CM | POA: Insufficient documentation

## 2018-10-21 DIAGNOSIS — E119 Type 2 diabetes mellitus without complications: Secondary | ICD-10-CM | POA: Diagnosis not present

## 2018-10-21 HISTORY — DX: Gastro-esophageal reflux disease without esophagitis: K21.9

## 2018-10-21 HISTORY — DX: Unspecified osteoarthritis, unspecified site: M19.90

## 2018-10-21 HISTORY — DX: Cardiac murmur, unspecified: R01.1

## 2018-10-21 LAB — BASIC METABOLIC PANEL
Anion gap: 9 (ref 5–15)
BUN: 37 mg/dL — ABNORMAL HIGH (ref 8–23)
CO2: 25 mmol/L (ref 22–32)
Calcium: 9.3 mg/dL (ref 8.9–10.3)
Chloride: 105 mmol/L (ref 98–111)
Creatinine, Ser: 1.33 mg/dL — ABNORMAL HIGH (ref 0.44–1.00)
GFR calc Af Amer: 47 mL/min — ABNORMAL LOW (ref >60)
GFR calc non Af Amer: 41 mL/min — ABNORMAL LOW (ref >60)
Glucose, Bld: 114 mg/dL — ABNORMAL HIGH (ref 70–99)
Potassium: 3.5 mmol/L (ref 3.5–5.1)
Sodium: 139 mmol/L (ref 135–145)

## 2018-10-21 LAB — CBC
HCT: 34.6 % — ABNORMAL LOW (ref 36.0–46.0)
Hemoglobin: 11.2 g/dL — ABNORMAL LOW (ref 12.0–15.0)
MCH: 30.9 pg (ref 26.0–34.0)
MCHC: 32.4 g/dL (ref 30.0–36.0)
MCV: 95.6 fL (ref 80.0–100.0)
Platelets: 203 10*3/uL (ref 150–400)
RBC: 3.62 MIL/uL — ABNORMAL LOW (ref 3.87–5.11)
RDW: 14.2 % (ref 11.5–15.5)
WBC: 8.1 10*3/uL (ref 4.0–10.5)
nRBC: 0 % (ref 0.0–0.2)

## 2018-10-21 LAB — URINALYSIS, ROUTINE W REFLEX MICROSCOPIC
Bacteria, UA: NONE SEEN
Bilirubin Urine: NEGATIVE
Glucose, UA: NEGATIVE mg/dL
Hgb urine dipstick: NEGATIVE
Ketones, ur: NEGATIVE mg/dL
Leukocytes,Ua: NEGATIVE
Nitrite: NEGATIVE
Protein, ur: NEGATIVE mg/dL
Specific Gravity, Urine: 1.008 (ref 1.005–1.030)
pH: 5 (ref 5.0–8.0)

## 2018-10-21 LAB — APTT: aPTT: 37 seconds — ABNORMAL HIGH (ref 24–36)

## 2018-10-21 LAB — SURGICAL PCR SCREEN
MRSA, PCR: NEGATIVE
Staphylococcus aureus: NEGATIVE

## 2018-10-21 LAB — PROTIME-INR
INR: 1.1 (ref 0.8–1.2)
Prothrombin Time: 13.8 seconds (ref 11.4–15.2)

## 2018-10-21 LAB — SEDIMENTATION RATE: Sed Rate: 68 mm/hr — ABNORMAL HIGH (ref 0–30)

## 2018-10-21 NOTE — Patient Instructions (Signed)
Your procedure is scheduled on: 10-25-18 TUESDAY Report to Same Day Surgery 2nd floor medical mall [facility] Entrance-take elevator on left to 2nd floor.  Check in with surgery information desk.) To find out your arrival time please call [phone] between 1PM - 3PM on 10-24-18 MONDAY  Remember: Instructions that are not followed completely may result in serious medical risk, up to and including death, or upon the discretion of your surgeon and anesthesiologist your surgery may need to be rescheduled.    _x___ 1. Do not eat food after midnight the night before your procedure. NO GUM OR CANDY AFTER MIDNIGHT. You may drink WATER up to 2 hours before you are scheduled to arrive at the hospital for your procedure.  Do not drink WATER within 2 hours of your scheduled arrival to the hospital.  Type 1 and type 2 diabetics should only drink water.   ____Ensure clear carbohydrate drink on the way to the hospital for bariatric patients  ____Ensure clear carbohydrate drink 3 hours before surgery for Dr Dwyane Luo patients if physician instructed.    __x__ 2. No Alcohol for 24 hours before or after surgery.   __x__3. No Smoking or e-cigarettes for 24 prior to surgery.  Do not use any chewable tobacco products for at least 6 hour prior to surgery   ____  4. Bring all medications with you on the day of surgery if instructed.    __x__ 5. Notify your doctor if there is any change in your medical condition     (cold, fever, infections).    x___6. On the morning of surgery brush your teeth with toothpaste and water.  You may rinse your mouth with mouth wash if you wish.  Do not swallow any toothpaste or mouthwash.   Do not wear jewelry, make-up, hairpins, clips or nail polish.  Do not wear lotions, powders, or perfumes. You may wear deodorant.  Do not shave 48 hours prior to surgery. Men may shave face and neck.  Do not bring valuables to the hospital.    [facility] is not responsible for any  belongings or valuables.               Contacts, dentures or bridgework may not be worn into surgery.  Leave your suitcase in the car. After surgery it may be brought to your room.  For patients admitted to the hospital, discharge time is determined by your treatment team.  _  Patients discharged the day of surgery will not be allowed to drive home.  You will need someone to drive you home and stay with you the night of your procedure.    Please read over the following fact sheets that you were given:   [facility] Preparing for Surgery and or MRSA Information   _x___ TAKE THE FOLLOWING MEDICATION THE MORNING OF SURGERY WITH A SMALL SIP OF WATER. These include:  1. GABAPENTIN (NEURONTIN)  2. COREG (CARVEDILOL)  3. YOU MAY TAKE HYDROCODONE DAY OF SURGERY IF NEEDED FOR PAIN  4.  5.  6.  ____Fleets enema or Magnesium Citrate as directed.   _x___ Use CHG Soap or sage wipes as directed on instruction sheet   ____ Use inhalers on the day of surgery and bring to hospital day of surgery  _X___ Stop Metformin 2 days prior to surgery-LAST DOSE ON Saturday, MAY 16TH   ____ Take 1/2 of usual insulin dose the night before surgery and none on the morning surgery.   _x___ Follow recommendations  from Cardiologist, Pulmonologist or PCP regarding stopping Aspirin, Coumadin, Plavix ,Eliquis, Effient, or Pradaxa, and Pletal-ASK DR [facility] ABOUT STOPPING YOUR ASPIRIN  X____Stop Anti-inflammatories such as Advil, Aleve, Ibuprofen, Motrin, Naproxen, Naprosyn, Goodies powders or aspirin products NOW-OK to take Tylenol    _x___ Stop supplements until after surgery-STOP CINNAMON AND FISH OIL NOW-MAY RESUME AFTER SURGERY   ____ Bring C-Pap to the hospital.

## 2018-10-22 LAB — NOVEL CORONAVIRUS, NAA (HOSP ORDER, SEND-OUT TO REF LAB; TAT 18-24 HRS): SARS-CoV-2, NAA: NOT DETECTED

## 2018-10-23 LAB — URINE CULTURE: Culture: <10000 — AB

## 2018-10-24 ENCOUNTER — Encounter: Payer: Self-pay | Admitting: *Deleted

## 2018-10-24 MED ORDER — CEFAZOLIN SODIUM-DEXTROSE 2-4 GM/100ML-% IV SOLN
2.0000 g | Freq: Once | INTRAVENOUS | Status: AC
  Administered 2018-10-25: 2 g via INTRAVENOUS

## 2018-10-25 ENCOUNTER — Inpatient Hospital Stay: Payer: PPO | Admitting: Certified Registered"

## 2018-10-25 ENCOUNTER — Inpatient Hospital Stay
Admission: RE | Admit: 2018-10-25 | Discharge: 2018-10-29 | DRG: 470 | Disposition: A | Discharge status: Home Health | Payer: PPO | Attending: Orthopedic Surgery | Admitting: Orthopedic Surgery

## 2018-10-25 ENCOUNTER — Encounter
Admission: RE | Disposition: A | Discharge status: Home Health | Payer: Self-pay | Source: Home / Self Care | Attending: Orthopedic Surgery

## 2018-10-25 ENCOUNTER — Other Ambulatory Visit: Payer: Self-pay

## 2018-10-25 ENCOUNTER — Encounter: Payer: Self-pay | Admitting: *Deleted

## 2018-10-25 ENCOUNTER — Inpatient Hospital Stay: Payer: PPO

## 2018-10-25 DIAGNOSIS — Z794 Long term (current) use of insulin: Secondary | ICD-10-CM

## 2018-10-25 DIAGNOSIS — G629 Polyneuropathy, unspecified: Secondary | ICD-10-CM | POA: Diagnosis present

## 2018-10-25 DIAGNOSIS — R42 Dizziness and giddiness: Secondary | ICD-10-CM | POA: Diagnosis not present

## 2018-10-25 DIAGNOSIS — E1122 Type 2 diabetes mellitus with diabetic chronic kidney disease: Secondary | ICD-10-CM | POA: Diagnosis present

## 2018-10-25 DIAGNOSIS — Z955 Presence of coronary angioplasty implant and graft: Secondary | ICD-10-CM | POA: Diagnosis not present

## 2018-10-25 DIAGNOSIS — Z90711 Acquired absence of uterus with remaining cervical stump: Secondary | ICD-10-CM | POA: Diagnosis not present

## 2018-10-25 DIAGNOSIS — Z8371 Family history of colonic polyps: Secondary | ICD-10-CM | POA: Diagnosis not present

## 2018-10-25 DIAGNOSIS — E7800 Pure hypercholesterolemia, unspecified: Secondary | ICD-10-CM | POA: Diagnosis not present

## 2018-10-25 DIAGNOSIS — Z79899 Other long term (current) drug therapy: Secondary | ICD-10-CM | POA: Diagnosis not present

## 2018-10-25 DIAGNOSIS — Z6834 Body mass index (BMI) 34.0-34.9, adult: Secondary | ICD-10-CM | POA: Diagnosis not present

## 2018-10-25 DIAGNOSIS — I2510 Atherosclerotic heart disease of native coronary artery without angina pectoris: Secondary | ICD-10-CM | POA: Diagnosis present

## 2018-10-25 DIAGNOSIS — I129 Hypertensive chronic kidney disease with stage 1 through stage 4 chronic kidney disease, or unspecified chronic kidney disease: Secondary | ICD-10-CM | POA: Diagnosis not present

## 2018-10-25 DIAGNOSIS — K219 Gastro-esophageal reflux disease without esophagitis: Secondary | ICD-10-CM | POA: Diagnosis not present

## 2018-10-25 DIAGNOSIS — M1712 Unilateral primary osteoarthritis, left knee: Secondary | ICD-10-CM | POA: Diagnosis not present

## 2018-10-25 DIAGNOSIS — D62 Acute posthemorrhagic anemia: Secondary | ICD-10-CM | POA: Diagnosis not present

## 2018-10-25 DIAGNOSIS — Z951 Presence of aortocoronary bypass graft: Secondary | ICD-10-CM

## 2018-10-25 DIAGNOSIS — N189 Chronic kidney disease, unspecified: Secondary | ICD-10-CM | POA: Diagnosis not present

## 2018-10-25 DIAGNOSIS — Z7982 Long term (current) use of aspirin: Secondary | ICD-10-CM | POA: Diagnosis not present

## 2018-10-25 DIAGNOSIS — Z471 Aftercare following joint replacement surgery: Secondary | ICD-10-CM | POA: Diagnosis not present

## 2018-10-25 DIAGNOSIS — Z79891 Long term (current) use of opiate analgesic: Secondary | ICD-10-CM | POA: Diagnosis not present

## 2018-10-25 DIAGNOSIS — Z885 Allergy status to narcotic agent status: Secondary | ICD-10-CM | POA: Diagnosis not present

## 2018-10-25 DIAGNOSIS — G8918 Other acute postprocedural pain: Secondary | ICD-10-CM

## 2018-10-25 DIAGNOSIS — Z96652 Presence of left artificial knee joint: Secondary | ICD-10-CM | POA: Diagnosis not present

## 2018-10-25 DIAGNOSIS — N183 Chronic kidney disease, stage 3 (moderate): Secondary | ICD-10-CM | POA: Diagnosis not present

## 2018-10-25 HISTORY — DX: Chronic kidney disease, unspecified: N18.9

## 2018-10-25 HISTORY — PX: TOTAL KNEE ARTHROPLASTY: SHX125

## 2018-10-25 LAB — CBC
HCT: 32.3 % — ABNORMAL LOW (ref 36.0–46.0)
Hemoglobin: 10.5 g/dL — ABNORMAL LOW (ref 12.0–15.0)
MCH: 31.2 pg (ref 26.0–34.0)
MCHC: 32.5 g/dL (ref 30.0–36.0)
MCV: 95.8 fL (ref 80.0–100.0)
Platelets: 175 10*3/uL (ref 150–400)
RBC: 3.37 MIL/uL — ABNORMAL LOW (ref 3.87–5.11)
RDW: 13.7 % (ref 11.5–15.5)
WBC: 8.2 10*3/uL (ref 4.0–10.5)
nRBC: 0 % (ref 0.0–0.2)

## 2018-10-25 LAB — GLUCOSE, CAPILLARY
Glucose-Capillary: 126 mg/dL — ABNORMAL HIGH (ref 70–99)
Glucose-Capillary: 107 mg/dL — ABNORMAL HIGH (ref 70–99)
Glucose-Capillary: 119 mg/dL — ABNORMAL HIGH (ref 70–99)
Glucose-Capillary: 134 mg/dL — ABNORMAL HIGH (ref 70–99)

## 2018-10-25 LAB — CREATININE, SERUM
Creatinine, Ser: 1.11 mg/dL — ABNORMAL HIGH (ref 0.44–1.00)
GFR calc Af Amer: 59 mL/min — ABNORMAL LOW (ref >60)
GFR calc non Af Amer: 51 mL/min — ABNORMAL LOW (ref >60)

## 2018-10-25 LAB — ABO/RH: ABO/RH(D): O POS

## 2018-10-25 SURGERY — ARTHROPLASTY, KNEE, TOTAL
Anesthesia: Spinal | Site: Knee | Laterality: Left

## 2018-10-25 MED ORDER — NEOMYCIN-POLYMYXIN B GU 40-200000 IR SOLN
Status: AC
  Filled 2018-10-25: qty 20

## 2018-10-25 MED ORDER — MORPHINE SULFATE (PF) 10 MG/ML IV SOLN
INTRAVENOUS | Status: AC
  Filled 2018-10-25: qty 1

## 2018-10-25 MED ORDER — GABAPENTIN 400 MG PO CAPS
800.0000 mg | ORAL_CAPSULE | Freq: Two times a day (BID) | ORAL | Status: DC
  Administered 2018-10-25 – 2018-10-29 (×8): 800 mg via ORAL
  Filled 2018-10-25: qty 2
  Filled 2018-10-25: qty 8
  Filled 2018-10-25 (×2): qty 2
  Filled 2018-10-25: qty 8
  Filled 2018-10-25 (×6): qty 2
  Filled 2018-10-25: qty 8

## 2018-10-25 MED ORDER — DOCUSATE SODIUM 100 MG PO CAPS
100.0000 mg | ORAL_CAPSULE | Freq: Two times a day (BID) | ORAL | Status: DC
  Administered 2018-10-25 – 2018-10-29 (×8): 100 mg via ORAL
  Filled 2018-10-25 (×8): qty 1

## 2018-10-25 MED ORDER — LIDOCAINE HCL (PF) 2 % IJ SOLN
INTRAMUSCULAR | Status: AC
  Filled 2018-10-25: qty 10

## 2018-10-25 MED ORDER — LISINOPRIL 20 MG PO TABS
20.0000 mg | ORAL_TABLET | Freq: Every day | ORAL | Status: DC
  Administered 2018-10-25 – 2018-10-29 (×5): 20 mg via ORAL
  Filled 2018-10-25 (×5): qty 1

## 2018-10-25 MED ORDER — CINNAMON 500 MG PO CAPS: 1000.0000 mg | ORAL_CAPSULE | Freq: Every day | ORAL | Status: DC

## 2018-10-25 MED ORDER — FAMOTIDINE 20 MG PO TABS
20.0000 mg | ORAL_TABLET | Freq: Once | ORAL | Status: AC
  Administered 2018-10-25: 07:00:00 20 mg via ORAL

## 2018-10-25 MED ORDER — BISACODYL 5 MG PO TBEC: 5.0000 mg | DELAYED_RELEASE_TABLET | Freq: Every day | ORAL | Status: DC | PRN

## 2018-10-25 MED ORDER — ZOLPIDEM TARTRATE 5 MG PO TABS: 5.0000 mg | ORAL_TABLET | Freq: Every evening | ORAL | Status: DC | PRN

## 2018-10-25 MED ORDER — FUROSEMIDE 20 MG PO TABS
20.0000 mg | ORAL_TABLET | ORAL | Status: DC
  Administered 2018-10-26 – 2018-10-28 (×2): 20 mg via ORAL
  Filled 2018-10-25 (×2): qty 1

## 2018-10-25 MED ORDER — MIDAZOLAM HCL 5 MG/5ML IJ SOLN
INTRAMUSCULAR | Status: DC | PRN
  Administered 2018-10-25: 2 mg via INTRAVENOUS

## 2018-10-25 MED ORDER — EPHEDRINE SULFATE 50 MG/ML IJ SOLN
INTRAMUSCULAR | Status: DC | PRN
  Administered 2018-10-25 (×2): 10 mg via INTRAVENOUS

## 2018-10-25 MED ORDER — VITAMIN C 500 MG PO TABS
500.0000 mg | ORAL_TABLET | Freq: Every day | ORAL | Status: DC
  Administered 2018-10-25 – 2018-10-29 (×5): 500 mg via ORAL
  Filled 2018-10-25 (×5): qty 1

## 2018-10-25 MED ORDER — METOCLOPRAMIDE HCL 10 MG PO TABS
5.0000 mg | ORAL_TABLET | Freq: Three times a day (TID) | ORAL | Status: DC | PRN
  Filled 2018-10-25: qty 1

## 2018-10-25 MED ORDER — EPHEDRINE SULFATE 50 MG/ML IJ SOLN
INTRAMUSCULAR | Status: AC
  Filled 2018-10-25: qty 1

## 2018-10-25 MED ORDER — LORATADINE 10 MG PO TABS: 10.0000 mg | ORAL_TABLET | Freq: Every day | ORAL | Status: DC | PRN

## 2018-10-25 MED ORDER — NEOMYCIN-POLYMYXIN B GU 40-200000 IR SOLN
Status: DC | PRN
  Administered 2018-10-25: 16 mL

## 2018-10-25 MED ORDER — PHENYLEPHRINE HCL (PRESSORS) 10 MG/ML IV SOLN
INTRAVENOUS | Status: DC | PRN
  Administered 2018-10-25 (×3): 100 ug via INTRAVENOUS

## 2018-10-25 MED ORDER — ONDANSETRON HCL 4 MG PO TABS: 4.0000 mg | ORAL_TABLET | Freq: Four times a day (QID) | ORAL | Status: DC | PRN

## 2018-10-25 MED ORDER — BUPIVACAINE-EPINEPHRINE (PF) 0.25% -1:200000 IJ SOLN
INTRAMUSCULAR | Status: AC
  Filled 2018-10-25: qty 30

## 2018-10-25 MED ORDER — LIDOCAINE HCL (PF) 2 % IJ SOLN
INTRAMUSCULAR | Status: DC | PRN
  Administered 2018-10-25: 50 mg

## 2018-10-25 MED ORDER — PROPOFOL 500 MG/50ML IV EMUL
INTRAVENOUS | Status: AC
  Filled 2018-10-25: qty 50

## 2018-10-25 MED ORDER — FENTANYL CITRATE (PF) 100 MCG/2ML IJ SOLN
INTRAMUSCULAR | Status: DC | PRN
  Administered 2018-10-25 (×2): 50 ug via INTRAVENOUS

## 2018-10-25 MED ORDER — SODIUM CHLORIDE (PF) 0.9 % IJ SOLN
INTRAMUSCULAR | Status: AC
  Filled 2018-10-25: qty 100

## 2018-10-25 MED ORDER — SODIUM CHLORIDE 0.9 % IV SOLN
INTRAVENOUS | Status: DC
  Administered 2018-10-25 – 2018-10-26 (×3): via INTRAVENOUS

## 2018-10-25 MED ORDER — ROSUVASTATIN CALCIUM 20 MG PO TABS
20.0000 mg | ORAL_TABLET | Freq: Every day | ORAL | Status: DC
  Administered 2018-10-25 – 2018-10-28 (×4): 20 mg via ORAL
  Filled 2018-10-25 (×4): qty 1

## 2018-10-25 MED ORDER — BUPIVACAINE HCL (PF) 0.5 % IJ SOLN
INTRAMUSCULAR | Status: AC
  Filled 2018-10-25: qty 10

## 2018-10-25 MED ORDER — FENTANYL CITRATE (PF) 100 MCG/2ML IJ SOLN
INTRAMUSCULAR | Status: AC
  Filled 2018-10-25: qty 2

## 2018-10-25 MED ORDER — ONDANSETRON HCL 4 MG/2ML IJ SOLN: 4.0000 mg | Freq: Once | INTRAMUSCULAR | Status: DC | PRN

## 2018-10-25 MED ORDER — CEFAZOLIN SODIUM-DEXTROSE 2-4 GM/100ML-% IV SOLN
INTRAVENOUS | Status: AC
  Filled 2018-10-25: qty 100

## 2018-10-25 MED ORDER — INSULIN ASPART 100 UNIT/ML ~~LOC~~ SOLN: 0.0000 [IU] | Freq: Three times a day (TID) | SUBCUTANEOUS | Status: DC

## 2018-10-25 MED ORDER — SODIUM CHLORIDE 0.9 % IV SOLN
INTRAVENOUS | Status: DC
  Administered 2018-10-25 (×3): via INTRAVENOUS

## 2018-10-25 MED ORDER — METHOCARBAMOL 1000 MG/10ML IJ SOLN
500.0000 mg | Freq: Four times a day (QID) | INTRAVENOUS | Status: DC | PRN
  Filled 2018-10-25: qty 5

## 2018-10-25 MED ORDER — MENTHOL 3 MG MT LOZG: 1.0000 | LOZENGE | OROMUCOSAL | Status: DC | PRN

## 2018-10-25 MED ORDER — DIPHENHYDRAMINE HCL 12.5 MG/5ML PO ELIX
12.5000 mg | ORAL_SOLUTION | ORAL | Status: DC | PRN
  Filled 2018-10-25: qty 10

## 2018-10-25 MED ORDER — MAGNESIUM CITRATE PO SOLN
1.0000 | Freq: Once | ORAL | Status: AC | PRN
  Administered 2018-10-29: 1 via ORAL
  Filled 2018-10-25: qty 296

## 2018-10-25 MED ORDER — GLYCOPYRROLATE 0.2 MG/ML IJ SOLN
INTRAMUSCULAR | Status: DC | PRN
  Administered 2018-10-25: 0.2 mg via INTRAVENOUS

## 2018-10-25 MED ORDER — OMEGA-3-ACID ETHYL ESTERS 1 G PO CAPS
1.0000 g | ORAL_CAPSULE | Freq: Every day | ORAL | Status: DC
  Administered 2018-10-25 – 2018-10-29 (×5): 1 g via ORAL
  Filled 2018-10-25 (×5): qty 1

## 2018-10-25 MED ORDER — EPINEPHRINE PF 1 MG/ML IJ SOLN
INTRAMUSCULAR | Status: DC | PRN
  Administered 2018-10-25: .0001 mL via INTRATHECAL

## 2018-10-25 MED ORDER — HYDROCHLOROTHIAZIDE 12.5 MG PO CAPS
12.5000 mg | ORAL_CAPSULE | Freq: Every day | ORAL | Status: DC
  Administered 2018-10-25 – 2018-10-29 (×5): 12.5 mg via ORAL
  Filled 2018-10-25 (×5): qty 1

## 2018-10-25 MED ORDER — BUPIVACAINE LIPOSOME 1.3 % IJ SUSP
INTRAMUSCULAR | Status: AC
  Filled 2018-10-25: qty 20

## 2018-10-25 MED ORDER — GABAPENTIN 400 MG PO CAPS
1600.0000 mg | ORAL_CAPSULE | Freq: Every day | ORAL | Status: DC
  Administered 2018-10-25 – 2018-10-28 (×4): 1600 mg via ORAL
  Filled 2018-10-25: qty 16
  Filled 2018-10-25: qty 4
  Filled 2018-10-25: qty 16
  Filled 2018-10-25 (×4): qty 4
  Filled 2018-10-25: qty 16

## 2018-10-25 MED ORDER — SODIUM CHLORIDE 0.9 % IV SOLN
INTRAVENOUS | Status: DC | PRN
  Administered 2018-10-25: 08:00:00 70 mL

## 2018-10-25 MED ORDER — GLYCOPYRROLATE 0.2 MG/ML IJ SOLN
INTRAMUSCULAR | Status: AC
  Filled 2018-10-25: qty 1

## 2018-10-25 MED ORDER — ACETAMINOPHEN 325 MG PO TABS: 325.0000 mg | ORAL_TABLET | Freq: Four times a day (QID) | ORAL | Status: DC | PRN

## 2018-10-25 MED ORDER — VITAMIN D 25 MCG (1000 UNIT) PO TABS
2000.0000 [IU] | ORAL_TABLET | Freq: Every day | ORAL | Status: DC
  Administered 2018-10-25 – 2018-10-29 (×5): 2000 [IU] via ORAL
  Filled 2018-10-25 (×5): qty 2

## 2018-10-25 MED ORDER — METHOCARBAMOL 500 MG PO TABS
500.0000 mg | ORAL_TABLET | Freq: Four times a day (QID) | ORAL | Status: DC | PRN
  Administered 2018-10-25: 22:00:00 500 mg via ORAL
  Filled 2018-10-25 (×3): qty 1

## 2018-10-25 MED ORDER — GABAPENTIN 800 MG PO TABS: 800.0000 mg | ORAL_TABLET | ORAL | Status: DC

## 2018-10-25 MED ORDER — ASPIRIN EC 81 MG PO TBEC
81.0000 mg | DELAYED_RELEASE_TABLET | Freq: Every day | ORAL | Status: DC
  Administered 2018-10-25 – 2018-10-29 (×5): 81 mg via ORAL
  Filled 2018-10-25 (×5): qty 1

## 2018-10-25 MED ORDER — PROPOFOL 500 MG/50ML IV EMUL
INTRAVENOUS | Status: DC | PRN
  Administered 2018-10-25: 25 ug/kg/min via INTRAVENOUS

## 2018-10-25 MED ORDER — FAMOTIDINE 20 MG PO TABS
ORAL_TABLET | ORAL | Status: AC
  Administered 2018-10-25: 07:00:00 20 mg via ORAL
  Filled 2018-10-25: qty 1

## 2018-10-25 MED ORDER — NITROGLYCERIN 0.4 MG SL SUBL: 0.4000 mg | SUBLINGUAL_TABLET | SUBLINGUAL | Status: DC | PRN

## 2018-10-25 MED ORDER — ACETAMINOPHEN 500 MG PO TABS: 500.0000 mg | ORAL_TABLET | Freq: Four times a day (QID) | ORAL | Status: AC

## 2018-10-25 MED ORDER — MAGNESIUM HYDROXIDE 400 MG/5ML PO SUSP
30.0000 mL | Freq: Every day | ORAL | Status: DC | PRN
  Administered 2018-10-26 – 2018-10-29 (×3): 30 mL via ORAL
  Filled 2018-10-25 (×4): qty 30

## 2018-10-25 MED ORDER — ONDANSETRON HCL 4 MG/2ML IJ SOLN
4.0000 mg | Freq: Four times a day (QID) | INTRAMUSCULAR | Status: DC | PRN
  Administered 2018-10-26 (×2): 4 mg via INTRAVENOUS
  Filled 2018-10-25 (×2): qty 2

## 2018-10-25 MED ORDER — BUPIVACAINE HCL (PF) 0.5 % IJ SOLN
INTRAMUSCULAR | Status: DC | PRN
  Administered 2018-10-25: 3 mL via INTRATHECAL

## 2018-10-25 MED ORDER — HYDROCODONE-ACETAMINOPHEN 5-325 MG PO TABS
1.0000 | ORAL_TABLET | ORAL | Status: DC | PRN
  Administered 2018-10-25 – 2018-10-27 (×3): 1 via ORAL
  Filled 2018-10-25 (×3): qty 1

## 2018-10-25 MED ORDER — CEFAZOLIN SODIUM-DEXTROSE 2-4 GM/100ML-% IV SOLN
2.0000 g | Freq: Four times a day (QID) | INTRAVENOUS | Status: AC
  Administered 2018-10-25 – 2018-10-26 (×3): 2 g via INTRAVENOUS
  Filled 2018-10-25 (×3): qty 100

## 2018-10-25 MED ORDER — HYDROCODONE-ACETAMINOPHEN 7.5-325 MG PO TABS
1.0000 | ORAL_TABLET | ORAL | Status: DC | PRN
  Administered 2018-10-25 – 2018-10-26 (×3): 2 via ORAL
  Filled 2018-10-25 (×3): qty 2
  Filled 2018-10-25: qty 1

## 2018-10-25 MED ORDER — METFORMIN HCL 500 MG PO TABS
500.0000 mg | ORAL_TABLET | Freq: Every evening | ORAL | Status: DC
  Administered 2018-10-25 – 2018-10-28 (×4): 500 mg via ORAL
  Filled 2018-10-25 (×5): qty 1

## 2018-10-25 MED ORDER — LISINOPRIL-HYDROCHLOROTHIAZIDE 20-12.5 MG PO TABS: 1.0000 | ORAL_TABLET | Freq: Every day | ORAL | Status: DC

## 2018-10-25 MED ORDER — FENTANYL CITRATE (PF) 100 MCG/2ML IJ SOLN: 25.0000 ug | INTRAMUSCULAR | Status: DC | PRN

## 2018-10-25 MED ORDER — BUPIVACAINE-EPINEPHRINE (PF) 0.25% -1:200000 IJ SOLN
INTRAMUSCULAR | Status: DC | PRN
  Administered 2018-10-25: 30 mL

## 2018-10-25 MED ORDER — ENOXAPARIN SODIUM 30 MG/0.3ML ~~LOC~~ SOLN
30.0000 mg | Freq: Two times a day (BID) | SUBCUTANEOUS | Status: DC
  Administered 2018-10-26 – 2018-10-29 (×7): 30 mg via SUBCUTANEOUS
  Filled 2018-10-25 (×7): qty 0.3

## 2018-10-25 MED ORDER — EPINEPHRINE PF 1 MG/ML IJ SOLN
INTRAMUSCULAR | Status: AC
  Filled 2018-10-25: qty 1

## 2018-10-25 MED ORDER — MIDAZOLAM HCL 2 MG/2ML IJ SOLN
INTRAMUSCULAR | Status: AC
  Filled 2018-10-25: qty 2

## 2018-10-25 MED ORDER — MORPHINE SULFATE (PF) 4 MG/ML IV SOLN
0.5000 mg | INTRAVENOUS | Status: DC | PRN
  Administered 2018-10-25 (×2): 1 mg via INTRAVENOUS
  Filled 2018-10-25 (×2): qty 1

## 2018-10-25 MED ORDER — MORPHINE SULFATE 10 MG/ML IJ SOLN
INTRAMUSCULAR | Status: DC | PRN
  Administered 2018-10-25: 10 mg

## 2018-10-25 MED ORDER — CARVEDILOL 6.25 MG PO TABS
6.2500 mg | ORAL_TABLET | Freq: Two times a day (BID) | ORAL | Status: DC
  Administered 2018-10-25 – 2018-10-29 (×8): 6.25 mg via ORAL
  Filled 2018-10-25 (×3): qty 1
  Filled 2018-10-25: qty 2
  Filled 2018-10-25: qty 1
  Filled 2018-10-25 (×2): qty 2
  Filled 2018-10-25 (×2): qty 1
  Filled 2018-10-25: qty 2
  Filled 2018-10-25: qty 1
  Filled 2018-10-25 (×2): qty 2
  Filled 2018-10-25: qty 1
  Filled 2018-10-25: qty 2
  Filled 2018-10-25: qty 1
  Filled 2018-10-25: qty 2

## 2018-10-25 MED ORDER — METOCLOPRAMIDE HCL 5 MG/ML IJ SOLN: 5.0000 mg | Freq: Three times a day (TID) | INTRAMUSCULAR | Status: DC | PRN

## 2018-10-25 MED ORDER — PHENOL 1.4 % MT LIQD: 1.0000 | OROMUCOSAL | Status: DC | PRN

## 2018-10-25 MED ORDER — POLYVINYL ALCOHOL 1.4 % OP SOLN
1.0000 [drp] | Freq: Every day | OPHTHALMIC | Status: DC
  Administered 2018-10-26 – 2018-10-29 (×4): 1 [drp] via OPHTHALMIC
  Filled 2018-10-25 (×2): qty 15

## 2018-10-25 SURGICAL SUPPLY — 69 items
APL PRP STRL LF DISP 70% ISPRP (MISCELLANEOUS) ×2
BANDAGE ACE 6X5 VEL STRL LF (GAUZE/BANDAGES/DRESSINGS) ×2 IMPLANT
BLADE SAGITTAL 25.0X1.19X90 (BLADE) ×2 IMPLANT
BLOCK CUTTING FEMUR 2+ LT MED (MISCELLANEOUS) ×1 IMPLANT
BLOCK CUTTING TIBIAL 2 LT (MISCELLANEOUS) ×1 IMPLANT
CANISTER SUCT 1200ML W/VALVE (MISCELLANEOUS) ×2 IMPLANT
CANISTER SUCT 3000ML PPV (MISCELLANEOUS) ×4 IMPLANT
CEMENT HV SMART SET (Cement) ×4 IMPLANT
CHLORAPREP W/TINT 26 (MISCELLANEOUS) ×4 IMPLANT
COOLER POLAR GLACIER W/PUMP (MISCELLANEOUS) ×2 IMPLANT
COVER WAND RF STERILE (DRAPES) ×2 IMPLANT
CUFF TOURN SGL QUICK 24 (TOURNIQUET CUFF)
CUFF TOURN SGL QUICK 30 (TOURNIQUET CUFF) ×2
CUFF TRNQT CYL 24X4X16.5-23 (TOURNIQUET CUFF) IMPLANT
CUFF TRNQT CYL 30X4X21-28X (TOURNIQUET CUFF) IMPLANT
DRAPE SHEET LG 3/4 BI-LAMINATE (DRAPES) ×4 IMPLANT
ELECT CAUTERY BLADE 6.4 (BLADE) ×2 IMPLANT
ELECT REM PT RETURN 9FT ADLT (ELECTROSURGICAL) ×2
ELECTRODE REM PT RTRN 9FT ADLT (ELECTROSURGICAL) ×1 IMPLANT
FEMERAL COMPONENT LEFT SZ2P (Femur) ×1 IMPLANT
FEMUR BONE MODEL 4.9010 MEDACT (MISCELLANEOUS) ×1 IMPLANT
GAUZE SPONGE 4X4 12PLY STRL (GAUZE/BANDAGES/DRESSINGS) ×2 IMPLANT
GAUZE XEROFORM 1X8 LF (GAUZE/BANDAGES/DRESSINGS) ×2 IMPLANT
GLOVE BIOGEL PI IND STRL 9 (GLOVE) ×1 IMPLANT
GLOVE BIOGEL PI INDICATOR 9 (GLOVE) ×1
GLOVE INDICATOR 8.0 STRL GRN (GLOVE) ×2 IMPLANT
GLOVE SURG ORTHO 8.0 STRL STRW (GLOVE) ×2 IMPLANT
GLOVE SURG SYN 9.0  PF PI (GLOVE) ×1
GLOVE SURG SYN 9.0 PF PI (GLOVE) ×1 IMPLANT
GOWN SRG 2XL LVL 4 RGLN SLV (GOWNS) ×1 IMPLANT
GOWN STRL NON-REIN 2XL LVL4 (GOWNS) ×2
GOWN STRL REUS W/ TWL LRG LVL3 (GOWN DISPOSABLE) ×1 IMPLANT
GOWN STRL REUS W/ TWL XL LVL3 (GOWN DISPOSABLE) ×1 IMPLANT
GOWN STRL REUS W/TWL LRG LVL3 (GOWN DISPOSABLE) ×2
GOWN STRL REUS W/TWL XL LVL3 (GOWN DISPOSABLE) ×2
HOLDER FOLEY CATH W/STRAP (MISCELLANEOUS) ×2 IMPLANT
HOOD PEEL AWAY FLYTE STAYCOOL (MISCELLANEOUS) ×4 IMPLANT
KIT TURNOVER KIT A (KITS) ×2 IMPLANT
NDL SAFETY ECLIPSE 18X1.5 (NEEDLE) ×1 IMPLANT
NDL SPNL 18GX3.5 QUINCKE PK (NEEDLE) ×1 IMPLANT
NDL SPNL 20GX3.5 QUINCKE YW (NEEDLE) ×1 IMPLANT
NEEDLE HYPO 18GX1.5 SHARP (NEEDLE) ×2
NEEDLE SPNL 18GX3.5 QUINCKE PK (NEEDLE) ×2 IMPLANT
NEEDLE SPNL 20GX3.5 QUINCKE YW (NEEDLE) ×2 IMPLANT
NS IRRIG 1000ML POUR BTL (IV SOLUTION) ×2 IMPLANT
PACK TOTAL KNEE (MISCELLANEOUS) ×2 IMPLANT
PAD WRAPON POLAR KNEE (MISCELLANEOUS) ×1 IMPLANT
PATELLA RESURFACING MEDACTA 02 (Bone Implant) ×1 IMPLANT
PULSAVAC PLUS IRRIG FAN TIP (DISPOSABLE) ×2
SCALPEL PROTECTED #10 DISP (BLADE) ×4 IMPLANT
SOL .9 NS 3000ML IRR  AL (IV SOLUTION) ×1
SOL .9 NS 3000ML IRR AL (IV SOLUTION) ×1
SOL .9 NS 3000ML IRR UROMATIC (IV SOLUTION) ×1 IMPLANT
STAPLER SKIN PROX 35W (STAPLE) ×2 IMPLANT
STEM EXTENSION 11MMX30MM (Stem) ×1 IMPLANT
SUCTION FRAZIER HANDLE 10FR (MISCELLANEOUS) ×1
SUCTION TUBE FRAZIER 10FR DISP (MISCELLANEOUS) ×1 IMPLANT
SUT DVC 2 QUILL PDO  T11 36X36 (SUTURE) ×1
SUT DVC 2 QUILL PDO T11 36X36 (SUTURE) ×1 IMPLANT
SUT V-LOC 90 ABS DVC 3-0 CL (SUTURE) ×2 IMPLANT
SYR 20CC LL (SYRINGE) ×2 IMPLANT
SYR 50ML LL SCALE MARK (SYRINGE) ×4 IMPLANT
TIBIAL BONE MODEL LEFT (MISCELLANEOUS) ×1 IMPLANT
TIBIAL TRAY FIXED MEDACTA 0207 (Joint) ×1 IMPLANT
TIP FAN IRRIG PULSAVAC PLUS (DISPOSABLE) ×1 IMPLANT
TOWEL OR 17X26 4PK STRL BLUE (TOWEL DISPOSABLE) ×2 IMPLANT
TOWER CARTRIDGE SMART MIX (DISPOSABLE) ×2 IMPLANT
TRAY FOLEY MTR SLVR 16FR STAT (SET/KITS/TRAYS/PACK) ×2 IMPLANT
WRAPON POLAR PAD KNEE (MISCELLANEOUS) ×2

## 2018-10-25 NOTE — Progress Notes (Signed)
Pt is in no acute distress other than pain control. RN is working on goal of pain control. VSS. Unable to place bone foam at this time due to pain. Pt says she will refuse sliding scale insulin this admission. Pt oriented to unit and POC.

## 2018-10-25 NOTE — H&P (Signed)
Reviewed paper H+P, will be scanned into chart. No changes noted.  

## 2018-10-25 NOTE — Progress Notes (Signed)
PT Cancellation Note  Patient Details Name: Renee [name] MRN: [id number] DOB: [birth year]   Cancelled Treatment:    Reason Eval/Treat Not Completed: Pain limiting ability to participate(PT entered room, informed by RN that pt was complaining of significant pain and not currently appropriate for PT. PT will follow up as able.)   Lieutenant Diego PT, DPT 3:17 PM,10/25/18 [phone]

## 2018-10-25 NOTE — Progress Notes (Signed)
PHARMACIST - PHYSICIAN ORDER COMMUNICATION  CONCERNING: P&T Medication Policy on Herbal Medications  DESCRIPTION:  This patient's order for:  Cinnamon capsules  has been noted.  This product(s) is classified as an "herbal" or natural product. Due to a lack of definitive safety studies or FDA approval, nonstandard manufacturing practices, plus the potential risk of unknown drug-drug interactions while on inpatient medications, the Pharmacy and Therapeutics Committee does not permit the use of "herbal" or natural products of this type within [facility].   ACTION TAKEN: The pharmacy department is unable to verify this order at this time. Please reevaluate patient's clinical condition at discharge and address if the herbal or natural product(s) should be resumed at that time.

## 2018-10-25 NOTE — Progress Notes (Signed)
   10/25/18 1400  Clinical Encounter Type  Visited With Patient  Visit Type Initial   Chaplain visited the patient during rounds. Upon arrival, she was awake and alert. She noted that she was looking for the call bell and that she was cold. Chaplain helped to identify the call bell and adjust the temperature in her room before she received a phone call. Will return at a later time.

## 2018-10-25 NOTE — Anesthesia Procedure Notes (Signed)
Spinal  Patient location during procedure: OR Staffing Anesthesiologist: Alvin Critchley, MD Resident/CRNA: Rolla Plate, CRNA Performed: resident/CRNA and anesthesiologist  Preanesthetic Checklist Completed: patient identified, site marked, surgical consent, pre-op evaluation, timeout performed, IV checked, risks and benefits discussed and monitors and equipment checked Spinal Block Patient position: sitting Prep: ChloraPrep and site prepped and draped Patient monitoring: heart rate, continuous pulse ox, blood pressure and cardiac monitor Approach: midline Location: L3-4 Injection technique: single-shot Needle Needle type: [place] and Introducer  Needle gauge: 24 G Needle length: 9 cm Additional Notes Negative paresthesia. Negative blood return. Positive free-flowing CSF. Expiration date of kit checked and confirmed. Patient tolerated procedure well, without complications.

## 2018-10-25 NOTE — TOC Initial Note (Signed)
Transition of Care [facility]) - Initial/Assessment Note    Patient Details  Name: Renee [name] MRN: [id number] Date of Birth: 06/09/1950  [facility]) CM/SW Contact:    Su Hilt, RN Phone Number: 10/25/2018, 3:18 PM  Clinical Narrative:                 Went in to see patient to discuss DC plan and needs She stated that she is in too much pain to talk with me today, I alerted her nurse of the patient's pain and she stated she would take her some pain medication Will return to speak with the patient when pain is better under control        Patient Goals and CMS Choice        Expected Discharge Plan and Services           Expected Discharge Date: 10/27/18                                    Prior Living Arrangements/Services                       Activities of Daily Living Home Assistive Devices/Equipment: Eyeglasses, Dentures (specify type) ADL Screening (condition at time of admission) Patient's cognitive ability adequate to safely complete daily activities?: Yes Is the patient deaf or have difficulty hearing?: No Does the patient have difficulty seeing, even when wearing glasses/contacts?: No Does the patient have difficulty concentrating, remembering, or making decisions?: No Patient able to express need for assistance with ADLs?: Yes Does the patient have difficulty dressing or bathing?: No Independently performs ADLs?: No Does the patient have difficulty walking or climbing stairs?: Yes Weakness of Legs: Left Weakness of Arms/Hands: None  Permission Sought/Granted                  Emotional Assessment              Admission diagnosis:  PRIMARY OSTEARTHRITIS OF LEFT KNEE Patient Active Problem List   Diagnosis Date Noted  . Status post total knee replacement using cement, left 10/25/2018   PCP:  Adin Hector, MD Pharmacy:   [id number] - [place], [place] [address] Phone: [phone] Fax: [phone]     Social Determinants of Health (SDOH) Interventions    Readmission Risk Interventions No flowsheet data found.

## 2018-10-25 NOTE — Anesthesia Preprocedure Evaluation (Signed)
Anesthesia Evaluation  Patient identified by MRN, date of birth, ID band Patient awake    Reviewed: Allergy & Precautions, H&P , NPO status , Patient's Chart, lab work & pertinent test results, reviewed documented beta blocker date and time   Airway Mallampati: II   Neck ROM: full    Dental  (+) Poor Dentition   Pulmonary neg pulmonary ROS, Current Smoker,    Pulmonary exam normal        Cardiovascular Exercise Tolerance: Poor hypertension, On Medications, Pt. on medications and Pt. on home beta blockers Normal cardiovascular exam+ Valvular Problems/Murmurs  Rhythm:regular Rate:Normal     Neuro/Psych negative neurological ROS  negative psych ROS   GI/Hepatic Neg liver ROS, GERD  ,  Endo/Other  negative endocrine ROSdiabetes, Well Controlled, Type 2, Oral Hypoglycemic AgentsMorbid obesity  Renal/GU Renal InsufficiencyRenal disease  negative genitourinary   Musculoskeletal   Abdominal   Peds  Hematology negative hematology ROS (+)   Anesthesia Other Findings Past Medical History: No date: Diabetes mellitus without complication ([place]) No date: Hypertension Past Surgical History: No date: ABDOMINAL HYSTERECTOMY 20+ yrs ago: BREAST BIOPSY; Right     Comment:  EXCISIONAL No date: CARDIAC SURGERY     Comment:  cabg No date: CORONARY ARTERY BYPASS GRAFT     Comment:  1996 BMI    Body Mass Index:  32.77 kg/m     Reproductive/Obstetrics negative OB ROS                             Anesthesia Physical  Anesthesia Plan  ASA: III  Anesthesia Plan: Spinal   Post-op Pain Management:    Induction:   PONV Risk Score and Plan:   Airway Management Planned: Nasal Cannula  Additional Equipment:   Intra-op Plan:   Post-operative Plan:   Informed Consent: I have reviewed the patients History and Physical, chart, labs and discussed the procedure including the risks, benefits and  alternatives for the proposed anesthesia with the patient or authorized representative who has indicated his/her understanding and acceptance.     Dental Advisory Given  Plan Discussed with: CRNA  Anesthesia Plan Comments:         Anesthesia Quick Evaluation

## 2018-10-25 NOTE — Transfer of Care (Signed)
Immediate Anesthesia Transfer of Care Note  Patient: Renee [name]  Procedure(s) Performed: TOTAL KNEE ARTHROPLASTY - LEFT - DIABETIC (Left Knee)  Patient Location: PACU  Anesthesia Type:Spinal  Level of Consciousness: awake  Airway & Oxygen Therapy: Patient Spontanous Breathing and Patient connected to face mask oxygen  Post-op Assessment: Report given to RN and Post -op Vital signs reviewed and stable  Post vital signs: Reviewed  Last Vitals:  Vitals Value Taken Time  BP 114/64 10/25/2018  9:47 AM  Temp    Pulse 66 10/25/2018  9:47 AM  Resp 11 10/25/2018  9:47 AM  SpO2 100 % 10/25/2018  9:47 AM  Vitals shown include unvalidated device data.  Last Pain:  Vitals:   10/25/18 0619  TempSrc: Temporal  PainSc: 7          Complications: No apparent anesthesia complications

## 2018-10-25 NOTE — Anesthesia Post-op Follow-up Note (Signed)
Anesthesia QCDR form completed.        

## 2018-10-25 NOTE — TOC Benefit Eligibility Note (Signed)
Transition of Care [facility]) Benefit Eligibility Note    Patient Details  Name: Renee [name] MRN: [id number] Date of Birth: 1951/06/04   Medication/Dose: Lovenox 40mg  once daily for 14 days.  Covered?: No  Tier: Other(If name brand approved, considered Tier 4 medication.)  Prescription Coverage Preferred Pharmacy: Any retail pharmacy.   Spoke with Person/Company/Phone Number:: [id number] with [employer] - [phone]  Co-Pay: $90 estimated cost if approved.   Prior Approval: Yes(PA required for name brand: [phone].)  Deductible: (No deductible on plan.)  Additional Notes: Generic Enoxaparin covered with no PA required.  Considered Tier 4 medication with estimated cost of $90.     Dannette Barbara Phone Number: [phone] or [phone] 10/25/2018, 9:10 AM

## 2018-10-25 NOTE — Op Note (Signed)
10/25/2018  9:51 AM  PATIENT:  Renee [name]  68 y.o. female  PRE-OPERATIVE DIAGNOSIS:  PRIMARY OSTEARTHRITIS OF LEFT KNEE  POST-OPERATIVE DIAGNOSIS:  PRIMARY OSTEARTHRITIS OF LEFT KNEE  PROCEDURE:  Procedure(s): TOTAL KNEE ARTHROPLASTY - LEFT - DIABETIC (Left)  SURGEON: Laurene Footman, MD  ASSISTANTS: Rachelle Hora, PA-C  ANESTHESIA:   spinal  EBL:  Total I/O In: 1300 [I.V.:1300] Out: 250 [Urine:200; Blood:50]  BLOOD ADMINISTERED:none  DRAINS: none   LOCAL MEDICATIONS USED:  MARCAINE    and OTHER Exparel and morphine  SPECIMEN:  No Specimen  DISPOSITION OF SPECIMEN:  N/A  COUNTS:  YES  TOURNIQUET:   67 minutes at 300 mmHg  IMPLANTS: Medacta GM K sphere 2+ left femur, 2 tibia with 30 mm stem and 11 mm polyethylene insert with size 2 patella, all components cemented  DICTATION: .Dragon Dictation   patient was brought to the operating room and after adequate spinal anesthesia was obtained the left leg was prepped and draped in the usual sterile fashion. After patient identification and timeout procedures were completed, tourniquet was raised and midline skin incision was made followed by medial parapatellar arthrotomy. There is exposed bone throughout the entire lateral femoral condyle withsevere patellofemoral andmild medial compartment degenerative changes but no exposed bone. The ACL and fat pad were excised off the anterior into the meniscus in the proximal tibia cutting guide from the [facility] was applied proximal tibia cut carried out. The distal femoral cut was carried out in a similar fashion and the2+ cutting guide applied with anterior posterior and chamfer cuts made. The posterior horns of the menisci were removed at this point. The2 tibia baseplate trial was placed pinned into position and proximal tibial preparation carried out with drilling hand reaming and the keel punch followed by placement of the 2+femur and sizing the tibial insert size  11gave the best fit with stability and full extension. The distal femoral drill holes were made in the notch cut for the trochlear groove was then carried out with trials were then removed the patella was cut using the patellar cutting guide and it sized to a size2, afterdrill holes have been made.     Throughout the case, the above local was infiltrated in the periarticular tissues and periosteum. The knee was irrigated with pulsatile lavage and the bony surfaces dried the tibial component was cemented into place first. Excess cement was removed and the polyethylene insert placed with a torque screw placed with a torque screwdriver tightened. The distal femoral component was placed and the knee was held in extension as the patellar button was clamped into place. After the cement was set excess cement was removed and the knee was again irrigated thoroughly thoroughly irrigated. The tourniquet was let down and hemostasis checked electrocautery. The arthrotomy was repaired with a heavy Quill suture followed by 3-0 V lock subcuticular closure,skin staples, incisional wound VAC appliedand Polar Care placed  PLAN OF CARE: Admit to inpatient   PATIENT DISPOSITION:  PACU - hemodynamically stable.

## 2018-10-26 ENCOUNTER — Encounter: Payer: Self-pay | Admitting: Orthopedic Surgery

## 2018-10-26 LAB — BASIC METABOLIC PANEL
Anion gap: 7 (ref 5–15)
BUN: 26 mg/dL — ABNORMAL HIGH (ref 8–23)
CO2: 21 mmol/L — ABNORMAL LOW (ref 22–32)
Calcium: 8.7 mg/dL — ABNORMAL LOW (ref 8.9–10.3)
Chloride: 112 mmol/L — ABNORMAL HIGH (ref 98–111)
Creatinine, Ser: 1.08 mg/dL — ABNORMAL HIGH (ref 0.44–1.00)
GFR calc Af Amer: >60 mL/min (ref >60)
GFR calc non Af Amer: 52 mL/min — ABNORMAL LOW (ref >60)
Glucose, Bld: 115 mg/dL — ABNORMAL HIGH (ref 70–99)
Potassium: 3.7 mmol/L (ref 3.5–5.1)
Sodium: 140 mmol/L (ref 135–145)

## 2018-10-26 LAB — CBC
HCT: 27.1 % — ABNORMAL LOW (ref 36.0–46.0)
Hemoglobin: 8.7 g/dL — ABNORMAL LOW (ref 12.0–15.0)
MCH: 30.6 pg (ref 26.0–34.0)
MCHC: 32.1 g/dL (ref 30.0–36.0)
MCV: 95.4 fL (ref 80.0–100.0)
Platelets: 157 10*3/uL (ref 150–400)
RBC: 2.84 MIL/uL — ABNORMAL LOW (ref 3.87–5.11)
RDW: 14 % (ref 11.5–15.5)
WBC: 8.4 10*3/uL (ref 4.0–10.5)
nRBC: 0 % (ref 0.0–0.2)

## 2018-10-26 LAB — GLUCOSE, CAPILLARY
Glucose-Capillary: 107 mg/dL — ABNORMAL HIGH (ref 70–99)
Glucose-Capillary: 110 mg/dL — ABNORMAL HIGH (ref 70–99)

## 2018-10-26 MED ORDER — FE FUMARATE-B12-VIT C-FA-IFC PO CAPS
1.0000 | ORAL_CAPSULE | Freq: Two times a day (BID) | ORAL | Status: DC
  Administered 2018-10-26 – 2018-10-29 (×7): 1 via ORAL
  Filled 2018-10-26 (×8): qty 1

## 2018-10-26 MED ORDER — SCOPOLAMINE 1 MG/3DAYS TD PT72
1.0000 | MEDICATED_PATCH | TRANSDERMAL | Status: DC
  Administered 2018-10-26: 17:00:00 1.5 mg via TRANSDERMAL
  Filled 2018-10-26 (×2): qty 1

## 2018-10-26 NOTE — Progress Notes (Signed)
OT Cancellation Note  Patient Details Name: Renee [name] MRN: [id number] DOB: May 12, 1951   Cancelled Treatment:    Reason Eval/Treat Not Completed: Other (comment). Due to high OT census, OT evaluation may be triaged until next date. Will continue to follow at a distance and initiate as able. Please call with any imminent needs or concerns.   Jeni Salles, MPH, MS, OTR/L Ascom [phone] 10/26/18, 9:04 AM

## 2018-10-26 NOTE — Evaluation (Signed)
Physical Therapy Evaluation Patient Details Name: Renee [name] MRN: [id number] DOB: June 18, 1950 Today's Date: 10/26/2018   History of Present Illness  Pt is 68 yo female s/p L TKA, WBAT. Current smoker, DM, HTN, GERD, CABG  Clinical Impression  Pt agreeable to PT, oriented, sleepy but able to participate. Reported pain 8/10 in L knee. Pt reported that she lives in a one story home with husband and family, available 24/7. Did not ambulate with AD, independent in ADLs/IADLs.  Pt needed verbal and tactile cues for all bed level exercises today. Was able to perform SLR with AAROM of LLE x5. Supine to sit with minA and step by step sequencing to maximize independence and mobility, Min for LLE management. Sit <> Stand with RW and CGA. Pt able to take very small shuffled steps towards chair with RW, minA and tactile cues for L TKE extension. Pt very fatigued and complained of nausea after mobilizing.  Overall the patient demonstrated deficits (see "PT Problem List") that impede the patient's functional abilities, safety, and mobility and would benefit from skilled PT intervention. Recommendation is HHPT with supervision 24 hrs pending pt progress.     Follow Up Recommendations Home health PT;Supervision/Assistance - 24 hour    Equipment Recommendations  None recommended by PT(Pt stated she has a 2 wheeled walker at home)    Recommendations for Other Services       Precautions / Restrictions Precautions Precautions: Fall Restrictions Weight Bearing Restrictions: Yes LLE Weight Bearing: Weight bearing as tolerated      Mobility  Bed Mobility Overal bed mobility: Needs Assistance Bed Mobility: Supine to Sit     Supine to sit: Min assist     General bed mobility comments: cues for sequencing to maximize mobility  Transfers Overall transfer level: Needs assistance Equipment used: Rolling walker (2 wheeled) Transfers: Sit to/from Stand Sit to Stand: Min guard             Ambulation/Gait Ambulation/Gait assistance: Herbalist (Feet): 2 Feet Assistive device: Rolling walker (2 wheeled)       General Gait Details: Pt need minA for RW, step by step instructions, and tactile cueing for LLE TKE to increase stability  Stairs            Wheelchair Mobility    Modified Rankin (Stroke Patients Only)       Balance Overall balance assessment: Needs assistance Sitting-balance support: Feet supported Sitting balance-Leahy Scale: Fair       Standing balance-Leahy Scale: Poor                               Pertinent Vitals/Pain Pain Assessment: 0-10 Pain Score: 8  Pain Descriptors / Indicators: Guarding;Crying;Moaning;Grimacing Pain Intervention(s): Limited activity within patient's tolerance;Monitored during session;Repositioned    Home Living Family/patient expects to be discharged to:: Private residence Living Arrangements: Spouse/significant other Available Help at Discharge: Family;Available 24 hours/day Type of Home: House Home Access: Ramped entrance     Home Layout: One level Home Equipment: Clinical cytogeneticist - 2 wheels;Cane - single point;Grab bars - tub/shower      Prior Function Level of Independence: Independent               Hand Dominance        Extremity/Trunk Assessment   Upper Extremity Assessment Upper Extremity Assessment: Generalized weakness    Lower Extremity Assessment Lower Extremity Assessment: RLE deficits/detail;LLE deficits/detail RLE Deficits / Details: WFLs  LLE Deficits / Details: s/p L TKA       Communication   Communication: No difficulties  Cognition Arousal/Alertness: Lethargic(Pt needed intermittent cues to keep eyes open) Behavior During Therapy: WFL for tasks assessed/performed Overall Cognitive Status: Within Functional Limits for tasks assessed                                        General Comments      Exercises Total Joint  Exercises Ankle Circles/Pumps: AROM;Both;10 reps Quad Sets: AROM;10 reps;Left Gluteal Sets: AROM;Both;15 reps Heel Slides: AAROM;Left;10 reps Hip ABduction/ADduction: AAROM;Left;10 reps Straight Leg Raises: AROM;Left;5 reps   Assessment/Plan    PT Assessment Patient needs continued PT services  PT Problem List Decreased strength;Decreased mobility;Decreased safety awareness;Decreased range of motion;Decreased knowledge of precautions;Decreased activity tolerance;Decreased balance;Decreased knowledge of use of DME;Pain       PT Treatment Interventions DME instruction;Functional mobility training;Balance training;Patient/family education;Gait training;Therapeutic activities;Neuromuscular re-education;Therapeutic exercise    PT Goals (Current goals can be found in the Care Plan section)  Acute Rehab PT Goals Patient Stated Goal: Pt wants to go home PT Goal Formulation: With patient Time For Goal Achievement: 11/09/18 Potential to Achieve Goals: Good    Frequency BID   Barriers to discharge        Co-evaluation               AM-PAC PT "6 Clicks" Mobility  Outcome Measure Help needed turning from your back to your side while in a flat bed without using bedrails?: A Little Help needed moving from lying on your back to sitting on the side of a flat bed without using bedrails?: A Little Help needed moving to and from a bed to a chair (including a wheelchair)?: A Little Help needed standing up from a chair using your arms (e.g., wheelchair or bedside chair)?: A Little Help needed to walk in hospital room?: A Lot Help needed climbing 3-5 steps with a railing? : Total 6 Click Score: 15    End of Session Equipment Utilized During Treatment: Gait belt Activity Tolerance: Patient limited by pain Patient left: with call bell/phone within reach;in chair;with chair alarm set;Other (comment);with SCD's reapplied(towel roll under L ankle) Nurse Communication: Mobility status PT  Visit Diagnosis: Difficulty in walking, not elsewhere classified (R26.2);Pain;Other abnormalities of gait and mobility (R26.89) Pain - Right/Left: Left Pain - part of body: Knee    Time: 1115-1202 PT Time Calculation (min) (ACUTE ONLY): 47 min   Charges:   PT Evaluation $PT Eval Low Complexity: 1 Low PT Treatments $Therapeutic Exercise: 23-37 mins $Therapeutic Activity: 8-22 mins        Lieutenant Diego PT, DPT 12:54 PM,10/26/18 [phone]

## 2018-10-26 NOTE — Progress Notes (Signed)
   Subjective: 1 Day Post-Op Procedure(s) (LRB): TOTAL KNEE ARTHROPLASTY - LEFT - DIABETIC (Left) Patient reports pain as 8 on 0-10 scale.   Patient is well, and has had no acute complaints or problems Denies any CP, SOB, ABD pain. We will start physical therapy today.   Objective: Vital signs in last 24 hours: Temp:  [97.1 F (36.2 C)-99.5 F (37.5 C)] 98.2 F (36.8 C) (05/20 0756) Pulse Rate:  [58-100] 87 (05/20 0756) Resp:  [11-24] 18 (05/20 0515) BP: (90-149)/(58-102) 129/73 (05/20 0756) SpO2:  [97 %-100 %] 99 % (05/20 0756) Weight:  [88.4 kg-93.3 kg] 88.4 kg (05/20 0515)  Intake/Output from previous day: 05/19 0701 - 05/20 0700 In: 2308.8 [P.O.:240; I.V.:1868.8; IV Piggyback:200] Out: 1300 [Urine:1250; Blood:50] Intake/Output this shift: No intake/output data recorded.  Recent Labs    10/25/18 1415 10/26/18 0446  HGB 10.5* 8.7*   Recent Labs    10/25/18 1415 10/26/18 0446  WBC 8.2 8.4  RBC 3.37* 2.84*  HCT 32.3* 27.1*  PLT 175 157   Recent Labs    10/25/18 1415 10/26/18 0446  NA  --  140  K  --  3.7  CL  --  112*  CO2  --  21*  BUN  --  26*  CREATININE 1.11* 1.08*  GLUCOSE  --  115*  CALCIUM  --  8.7*   No results for input(s): LABPT, INR in the last 72 hours.  EXAM General - Patient is Alert, Appropriate and Oriented Extremity - Neurovascular intact Sensation intact distally Intact pulses distally Dorsiflexion/Plantar flexion intact No cellulitis present Compartment soft Dressing - dressing C/D/I and no drainage wound VAC intact with less than 50 cc of drainage Motor Function - intact, moving foot and toes well on exam.   Past Medical History:  Diagnosis Date  . Arthritis   . Chronic kidney disease    stage 3  . Diabetes mellitus without complication ([place])   . GERD (gastroesophageal reflux disease)    no meds  . Heart murmur   . Hypertension     Assessment/Plan:   1 Day Post-Op Procedure(s) (LRB): TOTAL KNEE ARTHROPLASTY - LEFT  - DIABETIC (Left) Active Problems:   Status post total knee replacement using cement, left  Estimated body mass index is 34.52 kg/m as calculated from the following:   Height as of this encounter: 5\' 3"  (1.6 m).   Weight as of this encounter: 88.4 kg. Advance diet Up with therapy  Needs bowel movement Acute postop blood loss anemia-hemoglobin 8.7.  Start iron supplementation. Recheck labs in the morning Care management to assist with discharge Vital signs stable  DVT Prophylaxis - Lovenox, Foot Pumps and TED hose Weight-Bearing as tolerated to left leg   T. Rachelle Hora, PA-C [place] 10/26/2018, 8:10 AM

## 2018-10-26 NOTE — TOC Progression Note (Signed)
Transition of Care [facility]) - Progression Note    Patient Details  Name: Renee [name] MRN: [id number] Date of Birth: 02/28/1951  Transition of Care [facility]) CM/SW [place], RN Phone Number: 10/26/2018, 9:42 AM  Clinical Narrative:     Went in to talk with the patient and complete assessment, she states that she feels really sick now and does not want to talk to anyone at this time, I will come back at a later time when she feels better       Expected Discharge Plan and Services           Expected Discharge Date: 10/27/18                                     Social Determinants of Health (SDOH) Interventions    Readmission Risk Interventions No flowsheet data found.

## 2018-10-26 NOTE — Anesthesia Postprocedure Evaluation (Signed)
Anesthesia Post Note  Patient: Renee [name]  Procedure(s) Performed: TOTAL KNEE ARTHROPLASTY - LEFT - DIABETIC (Left Knee)  Patient location during evaluation: Nursing Unit Anesthesia Type: Spinal Level of consciousness: oriented and awake and alert Pain management: pain level controlled Vital Signs Assessment: post-procedure vital signs reviewed and stable Respiratory status: spontaneous breathing, respiratory function stable and patient connected to nasal cannula oxygen Cardiovascular status: blood pressure returned to baseline and stable Postop Assessment: no headache, no backache and no apparent nausea or vomiting Anesthetic complications: no     Last Vitals:  Vitals:   10/26/18 0515 10/26/18 0756  BP: 116/67 129/73  Pulse: 82 87  Resp: 18   Temp: 36.9 C 36.8 C  SpO2: 99% 99%    Last Pain:  Vitals:   10/26/18 0653  TempSrc:   PainSc: Asleep                 Amandy Chubbuck,  Clearnce Sorrel

## 2018-10-26 NOTE — Progress Notes (Signed)
PT Cancellation Note  Patient Details Name: Renee [name] MRN: [id number] DOB: 1950/07/03   Cancelled Treatment:    Reason Eval/Treat Not Completed: Other (comment)(CNA's at bedside in prep for pt bath. PT will follow up as able.)  Lieutenant Diego PT, DPT 10:29 AM,10/26/18 [phone]

## 2018-10-26 NOTE — Progress Notes (Signed)
OT Cancellation Note  Patient Details Name: Renee [name] MRN: [id number] DOB: 1951/01/14   Cancelled Treatment:    Reason Eval/Treat Not Completed: Pain limiting ability to participate. Spoke with PT. Pt nauseated and having increased pain following PT session. Will hold OT evaluation this date and re-attempt next date as appropriate.   Jeni Salles, MPH, MS, OTR/L ascom [phone] 10/26/18, 3:44 PM

## 2018-10-26 NOTE — Progress Notes (Signed)
Physical Therapy Treatment Patient Details Name: Renee [name] MRN: [id number] DOB: September 04, 1950 Today's Date: 10/26/2018    History of Present Illness Pt is 68 yo female s/p L TKA, WBAT. Current smoker, DM, HTN, GERD, CABG    PT Comments    Pt easily woken up in the recliner at start of session, pain 8/10. Pt able to participate in therapeutic exercises, AAROM. Limited knee extension noted (-15deg) educated about importance of quad sets, bone foam, and not propping anything underneath her knee. Pt performed sit <> Stand transfer x2 this session from chair and BSC, minA for RW stabilization and deficits in initial standing balance. Pt encouraged to ambulate a few more feet this session. PT exhibited step to gait pattern, extensive verbal and tactile cues for sequencing, decreased L TKE extension and weight bearing. Pt return to supine with all needs in reach, RN notified of patient's request for pain and nausea status. Discharge recommendation changed to STR due to continued limitation in functional mobility and level of assistance needed.     Follow Up Recommendations  SNF     Equipment Recommendations  None recommended by PT    Recommendations for Other Services       Precautions / Restrictions Precautions Precautions: Fall Restrictions Weight Bearing Restrictions: Yes LLE Weight Bearing: Weight bearing as tolerated    Mobility  Bed Mobility Overal bed mobility: Needs Assistance Bed Mobility: Supine to Sit     Supine to sit: Min assist     General bed mobility comments: cues for sequencing to maximize mobility  Transfers Overall transfer level: Needs assistance Equipment used: Rolling walker (2 wheeled) Transfers: Sit to/from Stand Sit to Stand: Min guard            Ambulation/Gait Ambulation/Gait assistance: Min Web designer (Feet): 4 Feet Assistive device: Rolling walker (2 wheeled)       General Gait Details: Pt need minA for RW, step by step  instructions, and tactile cueing for LLE TKE to increase stability. Step to gait pattern    Stairs             Wheelchair Mobility    Modified Rankin (Stroke Patients Only)       Balance Overall balance assessment: Needs assistance Sitting-balance support: Feet supported Sitting balance-Leahy Scale: Fair       Standing balance-Leahy Scale: Poor                              Cognition Arousal/Alertness: Awake/alert Behavior During Therapy: WFL for tasks assessed/performed Overall Cognitive Status: Within Functional Limits for tasks assessed                                        Exercises Total Joint Exercises Ankle Circles/Pumps: AROM;Both;20 reps Quad Sets: AROM;10 reps;Left Gluteal Sets: AROM;Both;15 reps Heel Slides: AAROM;Left;10 reps Hip ABduction/ADduction: AAROM;Left;10 reps Straight Leg Raises: AROM;Left;5 reps Knee Flexion: AAROM;Left;10 reps Goniometric ROM: -15 deg L knee extension, 70deg flexion Other Exercises Other Exercises: Pt able to stand pivot to [facility]. supervision for self care, minA for sit to stand due to initial standing balance deficits    General Comments        Pertinent Vitals/Pain Pain Assessment: 0-10 Pain Score: 8  Pain Descriptors / Indicators: Guarding;Crying;Moaning;Grimacing;Sharp Pain Intervention(s): Limited activity within patient's tolerance;Monitored during session;Repositioned;Ice applied    Home Living  Family/patient expects to be discharged to:: Private residence Living Arrangements: Spouse/significant other Available Help at Discharge: Family;Available 24 hours/day Type of Home: House Home Access: Ramped entrance   Home Layout: One level Home Equipment: Clinical cytogeneticist - 2 wheels;Cane - single point;Grab bars - tub/shower      Prior Function Level of Independence: Independent          PT Goals (current goals can now be found in the care plan section) Acute Rehab PT  Goals Patient Stated Goal: Pt wants to go home PT Goal Formulation: With patient Time For Goal Achievement: 11/09/18 Potential to Achieve Goals: Good Progress towards PT goals: Progressing toward goals    Frequency    BID      PT Plan Discharge plan needs to be updated    Co-evaluation              AM-PAC PT "6 Clicks" Mobility   Outcome Measure  Help needed turning from your back to your side while in a flat bed without using bedrails?: A Little Help needed moving from lying on your back to sitting on the side of a flat bed without using bedrails?: A Little Help needed moving to and from a bed to a chair (including a wheelchair)?: A Little Help needed standing up from a chair using your arms (e.g., wheelchair or bedside chair)?: A Little Help needed to walk in hospital room?: A Lot Help needed climbing 3-5 steps with a railing? : Total 6 Click Score: 15    End of Session Equipment Utilized During Treatment: Gait belt Activity Tolerance: Patient limited by pain;Patient limited by fatigue(limited by nausea) Patient left: with call bell/phone within reach;in chair;with chair alarm set;Other (comment);with SCD's reapplied Nurse Communication: Mobility status PT Visit Diagnosis: Difficulty in walking, not elsewhere classified (R26.2);Pain;Other abnormalities of gait and mobility (R26.89) Pain - Right/Left: Left Pain - part of body: Knee     Time: 6767-2094 PT Time Calculation (min) (ACUTE ONLY): 44 min  Charges:  $Gait Training: 8-22 mins $Therapeutic Exercise: 23-37 mins $Therapeutic Activity: 23-37 mins                     Lieutenant Diego PT, DPT 3:53 PM,10/26/18 [phone]

## 2018-10-26 NOTE — Progress Notes (Signed)
PT Cancellation Note  Patient Details Name: Renee [name] MRN: [id number] DOB: [birth year]   Cancelled Treatment:    Reason Eval/Treat Not Completed: Other (comment)(Pt eating breakfast at this time, requesting to finish breakfast prior to PT session.)   Lieutenant Diego PT, DPT 9:04 AM,10/26/18 [phone]

## 2018-10-26 NOTE — Plan of Care (Signed)
Pain managed with PRN pain medication. IS use encouraged. Polar care and [place] in place. Adequate urine output via foley catheter. All safety measures in place, will continue to monitor. Problem: Education: Goal: Knowledge of the prescribed therapeutic regimen will improve Outcome: Progressing   Problem: Activity: Goal: Ability to avoid complications of mobility impairment will improve Outcome: Progressing Goal: Range of joint motion will improve Outcome: Progressing   Problem: Clinical Measurements: Goal: Postoperative complications will be avoided or minimized Outcome: Progressing   Problem: Pain Management: Goal: Pain level will decrease with appropriate interventions Outcome: Progressing   Problem: Skin Integrity: Goal: Will show signs of wound healing Outcome: Progressing

## 2018-10-26 NOTE — TOC Initial Note (Signed)
Transition of Care [facility]) - Initial/Assessment Note    Patient Details  Name: Renee [name] MRN: [id number] Date of Birth: July 04, 1950  Transition of Care [facility]) CM/SW Contact:    Su Hilt, RN Phone Number: 10/26/2018, 2:20 PM  Clinical Narrative:                  Met with the patient to discuss DC plan and needs She lives at home with her husband and adult son, they provide transportation  She sees Dr. Caryl Comes [facility]CP [facility], she can afford her medications Requested cost of Lovenox from [facility] via email Patient needs a 3 in 1 and Rw, notified Brad with [employer] She chooses [facility] use [facility] for [facility] Continue to monitor for needs Expected Discharge Plan: [place] Barriers to Discharge: Continued Medical Work up   Patient Goals and CMS Choice Patient states their goals for this hospitalization and ongoing recovery are:: to go home CMS Medicare.gov Compare Post Acute Care list provided to:: Patient    Expected Discharge Plan and Services Expected Discharge Plan: [place]   Discharge Planning Services: CM Consult Post Acute Care Choice: [place] arrangements for the past 2 months: Single Family Home Expected Discharge Date: 10/27/18               DME Arranged: 3-N-1, Gilford Rile rolling DME Agency: [employer] Date DME Agency Contacted: 10/26/18 Time DME Agency Contacted: [phone] Representative spoke with at DME Agency: Maish Vaya: PT [place]: [facility] [employer] Date [place]: 10/26/18 Time [place]: 61 Representative spoke with at [place] Arrangements/Services Living arrangements for the past 2 months: [place] with:: Spouse, Adult Children Patient language and need for interpreter reviewed:: No Do you feel safe going back to the place where you live?: Yes      Need for Family Participation in Patient Care: No  (Comment) Care giver support system in place?: Yes (comment)   Criminal Activity/Legal Involvement Pertinent to Current Situation/Hospitalization: No - Comment as needed  Activities of Daily Living Home Assistive Devices/Equipment: Eyeglasses, Dentures (specify type) ADL Screening (condition at time of admission) Patient's cognitive ability adequate to safely complete daily activities?: Yes Is the patient deaf or have difficulty hearing?: No Does the patient have difficulty seeing, even when wearing glasses/contacts?: No Does the patient have difficulty concentrating, remembering, or making decisions?: No Patient able to express need for assistance with ADLs?: Yes Does the patient have difficulty dressing or bathing?: No Independently performs ADLs?: No Does the patient have difficulty walking or climbing stairs?: Yes Weakness of Legs: Left Weakness of Arms/Hands: None  Permission Sought/Granted   Permission granted to share information with : Yes, Verbal Permission Granted              Emotional Assessment Appearance:: Appears stated age Attitude/Demeanor/Rapport: Engaged Affect (typically observed): Accepting Orientation: : Oriented to Self, Oriented to Place, Oriented to  Time, Oriented to Situation Alcohol / Substance Use: Tobacco Use Psych Involvement: No (comment)  Admission diagnosis:  PRIMARY OSTEARTHRITIS OF LEFT KNEE Patient Active Problem List   Diagnosis Date Noted  . Status post total knee replacement using cement, left 10/25/2018   PCP:  Adin Hector, MD [facility]   [id number] [place] [address]Phone: [phone]:  [phone]     Social Determinants of Health (SDOH) Interventions    Readmission Risk Interventions No flowsheet data found.

## 2018-10-27 LAB — CBC
HCT: 24.8 % — ABNORMAL LOW (ref 36.0–46.0)
Hemoglobin: 8 g/dL — ABNORMAL LOW (ref 12.0–15.0)
MCH: 30.8 pg (ref 26.0–34.0)
MCHC: 32.3 g/dL (ref 30.0–36.0)
MCV: 95.4 fL (ref 80.0–100.0)
Platelets: 143 10*3/uL — ABNORMAL LOW (ref 150–400)
RBC: 2.6 MIL/uL — ABNORMAL LOW (ref 3.87–5.11)
RDW: 14.2 % (ref 11.5–15.5)
WBC: 9.8 10*3/uL (ref 4.0–10.5)
nRBC: 0 % (ref 0.0–0.2)

## 2018-10-27 LAB — GLUCOSE, CAPILLARY
Glucose-Capillary: 110 mg/dL — ABNORMAL HIGH (ref 70–99)
Glucose-Capillary: 117 mg/dL — ABNORMAL HIGH (ref 70–99)
Glucose-Capillary: 92 mg/dL (ref 70–99)

## 2018-10-27 LAB — BASIC METABOLIC PANEL
Anion gap: 8 (ref 5–15)
BUN: 24 mg/dL — ABNORMAL HIGH (ref 8–23)
CO2: 22 mmol/L (ref 22–32)
Calcium: 8.6 mg/dL — ABNORMAL LOW (ref 8.9–10.3)
Chloride: 109 mmol/L (ref 98–111)
Creatinine, Ser: 1.12 mg/dL — ABNORMAL HIGH (ref 0.44–1.00)
GFR calc Af Amer: 58 mL/min — ABNORMAL LOW (ref >60)
GFR calc non Af Amer: 50 mL/min — ABNORMAL LOW (ref >60)
Glucose, Bld: 142 mg/dL — ABNORMAL HIGH (ref 70–99)
Potassium: 3.8 mmol/L (ref 3.5–5.1)
Sodium: 139 mmol/L (ref 135–145)

## 2018-10-27 MED ORDER — OXYCODONE HCL ER 15 MG PO T12A
15.0000 mg | EXTENDED_RELEASE_TABLET | Freq: Two times a day (BID) | ORAL | Status: DC
  Administered 2018-10-27 – 2018-10-29 (×5): 15 mg via ORAL
  Filled 2018-10-27 (×6): qty 1

## 2018-10-27 NOTE — TOC Progression Note (Signed)
Transition of Care [facility]) - Progression Note    Patient Details  Name: Renee [name] MRN: [id number] Date of Birth: 1950/09/21  Transition of Care [facility]) CM/SW [place], RN Phone Number: 10/27/2018, 10:29 AM  Clinical Narrative:    Discuss the PT recommendation of going to SNF for rehab, she is declining very strongly, she states that she will go home and her husband and son will take care of her, she is animate that she is not going to SNF.  The plan is still to go home with [facility] services   Expected Discharge Plan: [place] Barriers to Discharge: Continued Medical Work up  Expected Discharge Plan and Services Expected Discharge Plan: [place]   Discharge Planning Services: CM Consult Post Acute Care Choice: [place] arrangements for the past 2 months: Single Family Home Expected Discharge Date: 10/27/18               DME Arranged: Berta Minor rolling DME Agency: [employer] Date DME Agency Contacted: 10/26/18 Time DME Agency Contacted: [phone] Representative spoke with at DME Agency: Leroy [place]: PT [place]: [facility] [employer] Date [place]: 10/26/18 Time [place]: 1418 Representative spoke with at [place]: [place] ([place]) Interventions    Readmission Risk Interventions No flowsheet data found.

## 2018-10-27 NOTE — Progress Notes (Signed)
PT Cancellation Note  Patient Details Name: Renee [name] MRN: [id number] DOB: 1951-03-27   Cancelled Treatment:    Reason Eval/Treat Not Completed: Other (comment)(PT entered room, pt sleeping, but able to open eyes. Requesting PT come back later after breakfast. Returned to sleeping. )   Lieutenant Diego PT, DPT 8:44 AM,10/27/18 [phone]

## 2018-10-27 NOTE — Progress Notes (Signed)
OT Cancellation Note  Patient Details Name: Renee [name] MRN: [id number] DOB: 1951/03/16   Cancelled Treatment:    Reason Eval/Treat Not Completed: Patient declined, no reason specified. Upon attempt, pt up in recliner, on the phone, and RN in room to provide pain medication. Therapist introduced herself and pt stated, "Whew, I don't think I can do that today with all that I got going on." Pt declines despite education and encouragement. Agreeable to re-attempt next date.   Jeni Salles, MPH, MS, OTR/L ascom [phone] 10/27/18, 2:51 PM

## 2018-10-27 NOTE — Progress Notes (Signed)
Physical Therapy Treatment Patient Details Name: Renee [name] MRN: [id number] DOB: [birth year] Today's Date: 10/27/2018    History of Present Illness Pt is 68 yo female s/p L TKA, WBAT. Current smoker, DM, HTN, GERD, CABG    PT Comments    Pt agreeable to PT, reported L knee pain. Pt eager to mobilize to EOB, minA for LLE. Some therapeutic exercises performed sitting EOB with AAROM. Sit <> stand this session modAx2 and RW due to increased fatigue. Able to transfer to chair with minAx2 and RW. Pt with decreased weight bearing to LLE, decreased weight shift, decreased R foot clearance. Pt up in chair with all needs in reach at end of session, towel roll underneath L ankle. Of note, pt with some confusion this PM. Pt intermittently disoriented to situation, but with cues to open eyes and to attend to PT, re-oriented easily. RN notified.    Follow Up Recommendations  SNF     Equipment Recommendations  None recommended by PT    Recommendations for Other Services       Precautions / Restrictions Precautions Precautions: Fall Restrictions Weight Bearing Restrictions: Yes LLE Weight Bearing: Weight bearing as tolerated    Mobility  Bed Mobility Overal bed mobility: Needs Assistance Bed Mobility: Supine to Sit     Supine to sit: Min assist     General bed mobility comments: Pt in long sitting with RLE off edge of bed. Assisted minA for LLE to complete transfer  Transfers Overall transfer level: Needs assistance Equipment used: Rolling walker (2 wheeled) Transfers: Sit to/from Stand Sit to Stand: Mod assist;+2 safety/equipment;+2 physical assistance            Ambulation/Gait Ambulation/Gait assistance: Mod assist Gait Distance (Feet): 2 Feet Assistive device: Rolling walker (2 wheeled)       General Gait Details: Pt exhibited step to/hop to gait pattern, modAx2 to maintain safety. Pt more fatigued this session.   Stairs             Wheelchair Mobility     Modified Rankin (Stroke Patients Only)       Balance Overall balance assessment: Needs assistance Sitting-balance support: Feet supported Sitting balance-Leahy Scale: Fair       Standing balance-Leahy Scale: Poor                              Cognition Arousal/Alertness: Awake/alert Behavior During Therapy: WFL for tasks assessed/performed Overall Cognitive Status: (Pt with 2-3 instances of confusion this PM. Conversing with eyes closed about church, apartments, confused about who PT is. Re-oriented with eyes open and verbal cues)                                        Exercises Total Joint Exercises Ankle Circles/Pumps: AROM;Both;10 reps Quad Sets: AROM;10 reps;Left Long Arc Quad: AAROM;Left;10 reps Goniometric ROM: -13 deg L knee flexion, 60deg flexion    General Comments        Pertinent Vitals/Pain      Home Living                      Prior Function            PT Goals (current goals can now be found in the care plan section) Progress towards PT goals: Not progressing toward goals - comment(limited by fatigue/pain)  Frequency    BID      PT Plan Discharge plan needs to be updated    Co-evaluation              AM-PAC PT "6 Clicks" Mobility   Outcome Measure  Help needed turning from your back to your side while in a flat bed without using bedrails?: A Little Help needed moving from lying on your back to sitting on the side of a flat bed without using bedrails?: A Little Help needed moving to and from a bed to a chair (including a wheelchair)?: A Lot Help needed standing up from a chair using your arms (e.g., wheelchair or bedside chair)?: A Lot Help needed to walk in hospital room?: Total Help needed climbing 3-5 steps with a railing? : Total 6 Click Score: 12    End of Session Equipment Utilized During Treatment: Gait belt Activity Tolerance: Patient limited by pain;Patient limited by  fatigue Patient left: with call bell/phone within reach;in chair;with chair alarm set;Other (comment);with SCD's reapplied Nurse Communication: Mobility status PT Visit Diagnosis: Difficulty in walking, not elsewhere classified (R26.2);Pain;Other abnormalities of gait and mobility (R26.89) Pain - Right/Left: Left Pain - part of body: Knee     Time: 1415-1441 PT Time Calculation (min) (ACUTE ONLY): 26 min  Charges:  $Therapeutic Exercise: 8-22 mins $Therapeutic Activity: 8-22 mins                     Lieutenant Diego PT, DPT 4:10 PM,10/27/18 [phone]

## 2018-10-27 NOTE — Progress Notes (Signed)
   Subjective: 2 Days Post-Op Procedure(s) (LRB): TOTAL KNEE ARTHROPLASTY - LEFT - DIABETIC (Left) Patient reports pain as moderate.   Patient is well, and has had no acute complaints or problems Denies any CP, SOB, ABD pain. We will continue with physical therapy today.   Objective: Vital signs in last 24 hours: Temp:  [98.8 F (37.1 C)-99.3 F (37.4 C)] 98.8 F (37.1 C) (05/21 0518) Pulse Rate:  [86-91] 90 (05/21 0518) Resp:  [18] 18 (05/21 0518) BP: (104-117)/(62-79) 104/62 (05/21 0518) SpO2:  [99 %-100 %] 100 % (05/21 0518)  Intake/Output from previous day: 05/20 0701 - 05/21 0700 In: 1052.6 [P.O.:240; I.V.:812.6] Out: -  Intake/Output this shift: No intake/output data recorded.  Recent Labs    10/25/18 1415 10/26/18 0446 10/27/18 0438  HGB 10.5* 8.7* 8.0*   Recent Labs    10/26/18 0446 10/27/18 0438  WBC 8.4 9.8  RBC 2.84* 2.60*  HCT 27.1* 24.8*  PLT 157 143*   Recent Labs    10/26/18 0446 10/27/18 0438  NA 140 139  K 3.7 3.8  CL 112* 109  CO2 21* 22  BUN 26* 24*  CREATININE 1.08* 1.12*  GLUCOSE 115* 142*  CALCIUM 8.7* 8.6*   No results for input(s): LABPT, INR in the last 72 hours.  EXAM General - Patient is Alert, Appropriate and Oriented Extremity - Neurovascular intact Sensation intact distally Intact pulses distally Dorsiflexion/Plantar flexion intact No cellulitis present Compartment soft Dressing - dressing C/D/I and no drainage wound VAC intact with less than 50 cc of drainage Motor Function - intact, moving foot and toes well on exam.   Past Medical History:  Diagnosis Date  . Arthritis   . Chronic kidney disease    stage 3  . Diabetes mellitus without complication ([place])   . GERD (gastroesophageal reflux disease)    no meds  . Heart murmur   . Hypertension     Assessment/Plan:   2 Days Post-Op Procedure(s) (LRB): TOTAL KNEE ARTHROPLASTY - LEFT - DIABETIC (Left) Active Problems:   Status post total knee replacement  using cement, left  Estimated body mass index is 34.52 kg/m as calculated from the following:   Height as of this encounter: 5\' 3"  (1.6 m).   Weight as of this encounter: 88.4 kg. Advance diet Up with therapy  Needs bowel movement Acute postop blood loss anemia-hemoglobin 8.0.  Continue with iron supplementation. Recheck labs in the am Care management to assist with discharge Vital signs stable  DVT Prophylaxis - Lovenox, Foot Pumps and TED hose Weight-Bearing as tolerated to left leg   T. Rachelle Hora, PA-C [place] 10/27/2018, 8:29 AM

## 2018-10-27 NOTE — Progress Notes (Signed)
OT Cancellation Note  Patient Details Name: Renee [name] MRN: [id number] DOB: 01-12-1951   Cancelled Treatment:    Reason Eval/Treat Not Completed: Other (comment). Consult received, chart reviewed. Pt with nurse and nurse tech for pt care upon attempt. RN requesting OT come back at later time.   Jeni Salles, MPH, MS, OTR/L ascom [phone] 10/27/18, 9:52 AM

## 2018-10-27 NOTE — Progress Notes (Signed)
Physical Therapy Treatment Patient Details Name: Renee [name] MRN: [id number] DOB: 03/17/1951 Today's Date: 10/27/2018    History of Present Illness Pt is 68 yo female s/p L TKA, WBAT. Current smoker, DM, HTN, GERD, CABG    PT Comments    Pt woken at start of session, and agreeable to PT session with motivation. Pt reported 8/10 in L knee pain with movement. Bed level exercises performed with AAROM for LLE. Supine to sit with minA, improved effort from pt this session. Sit <> stand with CGA and RW, verbal cues for hand placement. Attempted transfer to [facility] with CGA and RW, pt exhibited hop to/step to gait pattern. Loss of balance noted, dependent recovery attempted from PT and lowered to floor. RN notified and planning to contact MD. Pt returned to bed with assistance, all needs in reach. Pt in no acute distress, no complaints of pain. CNA at bedside to assess vitals.  The patient would benefit from further skilled PT to address goals and return to PLOF.     Follow Up Recommendations  SNF     Equipment Recommendations  None recommended by PT    Recommendations for Other Services       Precautions / Restrictions Precautions Precautions: Fall Restrictions Weight Bearing Restrictions: Yes LLE Weight Bearing: Weight bearing as tolerated    Mobility  Bed Mobility Overal bed mobility: Needs Assistance Bed Mobility: Supine to Sit     Supine to sit: Min assist Sit to supine: Min assist   General bed mobility comments: for LLE management, though improved supine to sit effort from pt this AM  Transfers Overall transfer level: Needs assistance Equipment used: Rolling walker (2 wheeled) Transfers: Sit to/from Stand Sit to Stand: Min guard            Ambulation/Gait Ambulation/Gait assistance: Herbalist (Feet): 2 Feet Assistive device: Rolling walker (2 wheeled)       General Gait Details: Pt still exhibiting step to/hop to gait pattern with ambulation.  Attempted to transfers to bedside commode   Stairs             Wheelchair Mobility    Modified Rankin (Stroke Patients Only)       Balance Overall balance assessment: Needs assistance Sitting-balance support: Feet supported Sitting balance-Leahy Scale: Fair       Standing balance-Leahy Scale: Poor                              Cognition Arousal/Alertness: Awake/alert Behavior During Therapy: WFL for tasks assessed/performed Overall Cognitive Status: Within Functional Limits for tasks assessed                                        Exercises Total Joint Exercises Ankle Circles/Pumps: AROM;Both;20 reps Quad Sets: AROM;10 reps;Left Gluteal Sets: AROM;Both;15 reps Heel Slides: AAROM;Left;10 reps    General Comments        Pertinent Vitals/Pain Pain Assessment: 0-10 Pain Score: 8  Pain Location: L knee Pain Descriptors / Indicators: Guarding;Crying;Moaning;Grimacing;Sharp Pain Intervention(s): Limited activity within patient's tolerance;Monitored during session;Repositioned    Home Living                      Prior Function            PT Goals (current goals can now be found in  the care plan section) Acute Rehab PT Goals Patient Stated Goal: Pt wants to go home PT Goal Formulation: With patient Progress towards PT goals: Not progressing toward goals - comment(Pt limited by pain/fatigue)    Frequency    BID      PT Plan Discharge plan needs to be updated    Co-evaluation              AM-PAC PT "6 Clicks" Mobility   Outcome Measure  Help needed turning from your back to your side while in a flat bed without using bedrails?: A Little Help needed moving from lying on your back to sitting on the side of a flat bed without using bedrails?: A Little Help needed moving to and from a bed to a chair (including a wheelchair)?: A Little Help needed standing up from a chair using your arms (e.g., wheelchair or  bedside chair)?: A Little Help needed to walk in hospital room?: A Lot   6 Click Score: 14    End of Session Equipment Utilized During Treatment: Gait belt Activity Tolerance: Patient limited by pain;Patient limited by fatigue Patient left: with call bell/phone within reach;in chair;with chair alarm set;Other (comment);with SCD's reapplied Nurse Communication: Mobility status PT Visit Diagnosis: Difficulty in walking, not elsewhere classified (R26.2);Pain;Other abnormalities of gait and mobility (R26.89) Pain - Right/Left: Left Pain - part of body: Knee     Time: 9833-8250 PT Time Calculation (min) (ACUTE ONLY): 35 min  Charges:  $Therapeutic Exercise: 8-22 mins $Therapeutic Activity: 8-22 mins                     Lieutenant Diego PT, DPT 12:00 PM,10/27/18 [phone]

## 2018-10-27 NOTE — Progress Notes (Signed)
This nurse was notified by PT Beverlee Nims) that pt was on the floor. Upon assessment, pt stated she was fine and did not hit head or hurt anything. PT was assisting pt to [facility] when pt started falling and PT assisted pt to sit on the floor. VSS. MD Rudene Christians notified. Will continue to monitor pt.

## 2018-10-28 LAB — CBC
HCT: 22.8 % — ABNORMAL LOW (ref 36.0–46.0)
Hemoglobin: 7.4 g/dL — ABNORMAL LOW (ref 12.0–15.0)
MCH: 31 pg (ref 26.0–34.0)
MCHC: 32.5 g/dL (ref 30.0–36.0)
MCV: 95.4 fL (ref 80.0–100.0)
Platelets: 148 10*3/uL — ABNORMAL LOW (ref 150–400)
RBC: 2.39 MIL/uL — ABNORMAL LOW (ref 3.87–5.11)
RDW: 14 % (ref 11.5–15.5)
WBC: 9.6 10*3/uL (ref 4.0–10.5)
nRBC: 0 % (ref 0.0–0.2)

## 2018-10-28 LAB — HEMOGLOBIN AND HEMATOCRIT, BLOOD
HCT: 27.4 % — ABNORMAL LOW (ref 36.0–46.0)
Hemoglobin: 9.1 g/dL — ABNORMAL LOW (ref 12.0–15.0)

## 2018-10-28 LAB — BASIC METABOLIC PANEL
Anion gap: 9 (ref 5–15)
BUN: 24 mg/dL — ABNORMAL HIGH (ref 8–23)
CO2: 23 mmol/L (ref 22–32)
Calcium: 8.9 mg/dL (ref 8.9–10.3)
Chloride: 107 mmol/L (ref 98–111)
Creatinine, Ser: 1.03 mg/dL — ABNORMAL HIGH (ref 0.44–1.00)
GFR calc Af Amer: >60 mL/min (ref >60)
GFR calc non Af Amer: 55 mL/min — ABNORMAL LOW (ref >60)
Glucose, Bld: 129 mg/dL — ABNORMAL HIGH (ref 70–99)
Potassium: 3.8 mmol/L (ref 3.5–5.1)
Sodium: 139 mmol/L (ref 135–145)

## 2018-10-28 LAB — GLUCOSE, CAPILLARY
Glucose-Capillary: 100 mg/dL — ABNORMAL HIGH (ref 70–99)
Glucose-Capillary: 109 mg/dL — ABNORMAL HIGH (ref 70–99)
Glucose-Capillary: 175 mg/dL — ABNORMAL HIGH (ref 70–99)
Glucose-Capillary: 123 mg/dL — ABNORMAL HIGH (ref 70–99)

## 2018-10-28 LAB — PREPARE RBC (CROSSMATCH)

## 2018-10-28 MED ORDER — ENOXAPARIN SODIUM 40 MG/0.4ML ~~LOC~~ SOLN: 40.0000 mg | SUBCUTANEOUS | 0 refills | Status: DC

## 2018-10-28 MED ORDER — SODIUM CHLORIDE 0.9% IV SOLUTION
Freq: Once | INTRAVENOUS | Status: AC
  Administered 2018-10-28: 13:00:00 via INTRAVENOUS

## 2018-10-28 MED ORDER — DOCUSATE SODIUM 100 MG PO CAPS: 100.0000 mg | ORAL_CAPSULE | Freq: Two times a day (BID) | ORAL | 0 refills | Status: DC

## 2018-10-28 MED ORDER — OXYCODONE HCL ER 15 MG PO T12A: 15.0000 mg | EXTENDED_RELEASE_TABLET | Freq: Two times a day (BID) | ORAL | 0 refills | Status: AC

## 2018-10-28 MED ORDER — HYDROCODONE-ACETAMINOPHEN 5-325 MG PO TABS: 1.0000 | ORAL_TABLET | ORAL | 0 refills | Status: DC | PRN

## 2018-10-28 NOTE — Progress Notes (Signed)
Subjective: 3 Days Post-Op Procedure(s) (LRB): TOTAL KNEE ARTHROPLASTY - LEFT - DIABETIC (Left) Patient reports pain as moderate but improved compared to yesterday.   Patient is well but Hg 7.4 this AM. Denies any CP, SOB, ABD pain. Does report some dizziness this morning. We will continue with physical therapy today, current recommendation is for discharge to SNF however patient is adamant on returning home.  Objective: Vital signs in last 24 hours: Temp:  [98.2 F (36.8 C)-100.2 F (37.9 C)] 98.8 F (37.1 C) (05/22 0606) Pulse Rate:  [68-102] 87 (05/22 0606) Resp:  [16-20] 19 (05/22 0606) BP: (113-145)/(67-110) 113/71 (05/22 0606) SpO2:  [97 %-100 %] 97 % (05/22 0606)  Intake/Output from previous day: 05/21 0701 - 05/22 0700 In: 240 [P.O.:240] Out: 350 [Urine:350] Intake/Output this shift: No intake/output data recorded.  Recent Labs    10/25/18 1415 10/26/18 0446 10/27/18 0438 10/28/18 0445  HGB 10.5* 8.7* 8.0* 7.4*   Recent Labs    10/27/18 0438 10/28/18 0445  WBC 9.8 9.6  RBC 2.60* 2.39*  HCT 24.8* 22.8*  PLT 143* 148*   Recent Labs    10/27/18 0438 10/28/18 0445  NA 139 139  K 3.8 3.8  CL 109 107  CO2 22 23  BUN 24* 24*  CREATININE 1.12* 1.03*  GLUCOSE 142* 129*  CALCIUM 8.6* 8.9   No results for input(s): LABPT, INR in the last 72 hours.  EXAM General - Patient is Alert, Appropriate and Oriented Extremity - Neurovascular intact Sensation intact distally Intact pulses distally Dorsiflexion/Plantar flexion intact No cellulitis present Compartment soft Dressing - dressing C/D/I and no drainage wound VAC intact with less than 50 cc of drainage Motor Function - intact, moving foot and toes well on exam.   Past Medical History:  Diagnosis Date  . Arthritis   . Chronic kidney disease    stage 3  . Diabetes mellitus without complication ([place])   . GERD (gastroesophageal reflux disease)    no meds  . Heart murmur   . Hypertension      Assessment/Plan:   3 Days Post-Op Procedure(s) (LRB): TOTAL KNEE ARTHROPLASTY - LEFT - DIABETIC (Left) Active Problems:   Status post total knee replacement using cement, left  Estimated body mass index is 34.52 kg/m as calculated from the following:   Height as of this encounter: 5\' 3"  (1.6 m).   Weight as of this encounter: 88.4 kg. Advance diet Up with therapy  Needs bowel movement, move on to FLEET enema today. Acute postop blood loss anemia-hemoglobin 7.4.  Will plan on transfusion today.  Recheck labs after transfusion Care management to assist with discharge  DVT Prophylaxis - Lovenox, Foot Pumps and TED hose Weight-Bearing as tolerated to left leg  J. Cameron Proud, PA-C [place] 10/28/2018, 7:59 AM

## 2018-10-28 NOTE — Care Management Important Message (Signed)
Important Message  Patient Details  Name: Renee [name] MRN: [id number] Date of Birth: Jan 05, 1951   Medicare Important Message Given:  Yes    Juliann Pulse A Shaden Higley 10/28/2018, 10:55 AM

## 2018-10-28 NOTE — Progress Notes (Signed)
Physical Therapy Treatment Patient Details Name: Renee [name] MRN: [id number] DOB: March 18, 1951 Today's Date: 10/28/2018    History of Present Illness Pt is 68 yo female s/p L TKA, WBAT. Current smoker, DM, HTN, GERD, CABG    PT Comments    Pt agreeable to PT/OT co-treat.  Minimal pain at rest but L knee pain increased to 7/10 after mobility (pt reporting pain decreasing with rest in chair).  Pt required 2 assist to stand from bed (significant posterior lean noted requiring assist to shift weight forward) and take steps bed to recliner with RW.  Significant cueing and extra time required for all activities (pt did better with simple 1 step commands).  L knee flexion AAROM to 75 degrees today.  Continue to strongly recommend STR.   Follow Up Recommendations  SNF     Equipment Recommendations  Rolling walker with 5" wheels;3in1 (PT);Wheelchair (measurements PT);Wheelchair cushion (measurements PT);Other (comment)(Hoyer lift)    Recommendations for Other Services OT consult     Precautions / Restrictions Precautions Precautions: Fall Restrictions Weight Bearing Restrictions: Yes LLE Weight Bearing: Weight bearing as tolerated    Mobility  Bed Mobility Overal bed mobility: Needs Assistance Bed Mobility: Supine to Sit     Supine to sit: Min assist;Mod assist     General bed mobility comments: min to mod assist for LLE management to get EOB; vc's for technique; increased time for pt to perform  Transfers Overall transfer level: Needs assistance Equipment used: Rolling walker (2 wheeled) Transfers: Sit to/from Stand Sit to Stand: Mod assist;+2 physical assistance         General transfer comment: max vc's for technique and UE/LE positioning; pt pushing/leaning backwards upon standing requiring 2 assist to shift pt's weight forward with cueing to lean forward and for UE's pushing through RW; vc's and assist for sliding L LE forward when  sitting  Ambulation/Gait Ambulation/Gait assistance: Min assist;Mod assist;+2 physical assistance Gait Distance (Feet): 3 Feet(bed to recliner) Assistive device: Rolling walker (2 wheeled)   Gait velocity: decreased   General Gait Details: decreased stance time L LE; vc's to increase UE support through RW; vc's for L knee extension during L LE stance phase (L knee flexed but did not buckle); vc's for stepping pattern; increased time to perform   Stairs             Wheelchair Mobility    Modified Rankin (Stroke Patients Only)       Balance Overall balance assessment: Needs assistance Sitting-balance support: No upper extremity supported;Feet supported Sitting balance-Leahy Scale: Fair Sitting balance - Comments: steady static sitting Postural control: Posterior lean Standing balance support: Bilateral upper extremity supported Standing balance-Leahy Scale: Poor Standing balance comment: Posterior lean initially with standing requiring cueing and assist to shift weight forward                            Cognition Arousal/Alertness: Awake/alert Behavior During Therapy: WFL for tasks assessed/performed Overall Cognitive Status: No family/caregiver present to determine baseline cognitive functioning                                 General Comments: Increased time to respond and process info; max vc's for carryover during session      Exercises Total Joint Exercises Heel Slides: AAROM;Strengthening;Left;10 reps;Supine Goniometric ROM: L knee extension 12 degrees short of neutral; L knee flexion AAROM 75  degrees sitting edge of recliner    General Comments   Nursing cleared pt for participation in physical therapy.  Pt agreeable to PT session.      Pertinent Vitals/Pain Pain Assessment: Faces Pain Score: 7  Faces Pain Scale: Hurts little more Pain Location: denies pain at rest, increased with movement Pain Descriptors / Indicators:  Guarding;Moaning;Grimacing Pain Intervention(s): Limited activity within patient's tolerance;Monitored during session;Repositioned;Ice applied    Home Living         Prior Function          PT Goals (current goals can now be found in the care plan section) Acute Rehab PT Goals Patient Stated Goal: to go home PT Goal Formulation: With patient Time For Goal Achievement: 11/09/18 Potential to Achieve Goals: Fair Progress towards PT goals: Not progressing toward goals - comment(limited d/t pain and fatigue)    Frequency    BID      PT Plan Current plan remains appropriate    Co-evaluation PT/OT/SLP Co-Evaluation/Treatment: Yes Reason for Co-Treatment: For patient/therapist safety;To address functional/ADL transfers PT goals addressed during session: Mobility/safety with mobility;Balance;Proper use of DME;Strengthening/ROM OT goals addressed during session: Strengthening/ROM;Proper use of Adaptive equipment and DME;ADL's and self-care      AM-PAC PT "6 Clicks" Mobility   Outcome Measure  Help needed turning from your back to your side while in a flat bed without using bedrails?: A Little Help needed moving from lying on your back to sitting on the side of a flat bed without using bedrails?: A Lot Help needed moving to and from a bed to a chair (including a wheelchair)?: Total Help needed standing up from a chair using your arms (e.g., wheelchair or bedside chair)?: Total Help needed to walk in hospital room?: Total Help needed climbing 3-5 steps with a railing? : Total 6 Click Score: 9    End of Session Equipment Utilized During Treatment: Gait belt Activity Tolerance: Patient limited by pain;Patient limited by fatigue Patient left: in chair;with call bell/phone within reach;with chair alarm set;with SCD's reapplied;Other (comment)(L heel elevated via towel roll; R heel floating via pillow) Nurse Communication: Mobility status;Precautions;Weight bearing status PT  Visit Diagnosis: Difficulty in walking, not elsewhere classified (R26.2);Pain;Other abnormalities of gait and mobility (R26.89) Pain - Right/Left: Left Pain - part of body: Knee     Time: 0921-0954 PT Time Calculation (min) (ACUTE ONLY): 33 min  Charges:  $Therapeutic Activity: 23-37 mins                     Leitha Bleak, PT 10/28/18, 1:26 PM [phone]

## 2018-10-28 NOTE — Discharge Summary (Addendum)
Physician Discharge Summary  Patient ID: Renee [name] MRN: [id number] DOB/AGE: [birth year] 68 y.o.  Admit date: 10/25/2018 Discharge date: 10/29/2018 Admission Diagnoses:  PRIMARY OSTEARTHRITIS OF LEFT KNEE   Discharge Diagnoses: Patient Active Problem List   Diagnosis Date Noted  . Status post total knee replacement using cement, left 10/25/2018    Past Medical History:  Diagnosis Date  . Arthritis   . Chronic kidney disease    stage 3  . Diabetes mellitus without complication ([place])   . GERD (gastroesophageal reflux disease)    no meds  . Heart murmur   . Hypertension      Transfusion: 1 unit of packed red blood cells on 10/28/2018   Consultants (if any):   Discharged Condition: Improved  Hospital Course: Renee [name] is an 68 y.o. female who was admitted 10/25/2018 with a diagnosis of left knee osteoarthritis and went to the operating room on 10/25/2018 and underwent the above named procedures.    Surgeries: Procedure(s): TOTAL KNEE ARTHROPLASTY - LEFT - DIABETIC on 10/25/2018 Patient tolerated the surgery well. Taken to PACU where she was stabilized and then transferred to the orthopedic floor.  Started on Lovenox 30 mg q 12 hrs. Foot pumps applied bilaterally at 80 mm. Heels elevated on bed with rolled towels. No evidence of DVT. Negative [place]. Physical therapy started on day #1 for gait training and transfer. OT started day #1 for ADL and assisted devices.  Patient's foley was d/c on day #1, patient made slow progress of physical therapy.  On postop day 2 patient had acute postop blood loss anemia with hemoglobin of 8.0.  Patient was given iron supplementation.  Vital signs stable patient asymptomatic.  On postop day 3 hemoglobin 7.4, patient was transfused 1 unit of packed red blood cells.  Vital signs stable.  Implants: Medacta GM K sphere 2+ left femur, 2 tibia with 30 mm stem and 11 mm polyethylene insert with size 2 patella, all components cemented   She was  given perioperative antibiotics:  Anti-infectives (From admission, onward)   Start     Dose/Rate Route Frequency Ordered Stop   10/25/18 1400  ceFAZolin (ANCEF) IVPB 2g/100 mL premix     2 g 200 mL/hr over 30 Minutes Intravenous Every 6 hours 10/25/18 1326 10/26/18 0211   10/25/18 0606  ceFAZolin (ANCEF) 2-4 GM/100ML-% IVPB    Note to Pharmacy:  Milinda Cave   : cabinet override      10/25/18 0606 10/25/18 0805   10/24/18 2200  ceFAZolin (ANCEF) IVPB 2g/100 mL premix     2 g 200 mL/hr over 30 Minutes Intravenous  Once 10/24/18 2157 10/25/18 0835    .  She was given sequential compression devices, early ambulation, and Lovenox for DVT prophylaxis.  She benefited maximally from the hospital stay and there were no complications.    Recent vital signs:  Vitals:   10/28/18 1936 10/29/18 0409  BP: 108/70 107/61  Pulse: 90 88  Resp: 19 16  Temp: 99.3 F (37.4 C) 99.2 F (37.3 C)  SpO2: 96% 98%    Recent laboratory studies:  Lab Results  Component Value Date   HGB 9.1 (L) 10/28/2018   HGB 7.4 (L) 10/28/2018   HGB 8.0 (L) 10/27/2018   Lab Results  Component Value Date   WBC 9.6 10/28/2018   PLT 148 (L) 10/28/2018   Lab Results  Component Value Date   INR 1.1 10/21/2018   Lab Results  Component Value Date   NA  139 10/28/2018   K 3.8 10/28/2018   CL 107 10/28/2018   CO2 23 10/28/2018   BUN 24 (H) 10/28/2018   CREATININE 1.03 (H) 10/28/2018   GLUCOSE 129 (H) 10/28/2018    Discharge Medications:   Allergies as of 10/29/2018      Reactions   Codeine Nausea And Vomiting   Tramadol Nausea And Vomiting      Medication List    TAKE these medications   aspirin EC 81 MG tablet Take 81 mg by mouth daily.   carvedilol 6.25 MG tablet Commonly known as:  COREG Take 6.25 mg by mouth 2 (two) times a day.   CINNAMON PO Take 1,000 mg by mouth daily.   docusate sodium 100 MG capsule Commonly known as:  COLACE Take 1 capsule (100 mg total) by mouth 2 (two) times  daily.   enoxaparin 40 MG/0.4ML injection Commonly known as:  Lovenox Inject 0.4 mLs (40 mg total) into the skin daily for 14 days.   Fish Oil 1000 MG Caps Take 1,000 mg by mouth 3 (three) times daily.   furosemide 20 MG tablet Commonly known as:  LASIX Take 20 mg by mouth every other day. In the morning   gabapentin 800 MG tablet Commonly known as:  NEURONTIN Take 800-1,600 mg by mouth See admin instructions. Take 1 tablet (800 mg) by mouth in the morning, 1 tablet (800 mg) by mouth at noon, & take 2 tablets (1600 mg) by mouth at bedtime.   HYDROcodone-acetaminophen 5-325 MG tablet Commonly known as:  NORCO/VICODIN Take 1-2 tablets by mouth every 4 (four) hours as needed for moderate pain (pain score 4-6). What changed:    how much to take  when to take this  reasons to take this   lisinopril-hydrochlorothiazide 20-12.5 MG tablet Commonly known as:  ZESTORETIC Take 1 tablet by mouth daily.   loratadine 10 MG tablet Commonly known as:  CLARITIN Take 10 mg by mouth daily as needed for allergies.   Lubricant Eye Drops 0.4-0.3 % Soln Generic drug:  Polyethyl Glycol-Propyl Glycol Place 1 drop into both eyes daily. But can do bid if needed   metFORMIN 500 MG tablet Commonly known as:  GLUCOPHAGE Take 500 mg by mouth every evening.   nitroGLYCERIN 0.4 MG SL tablet Commonly known as:  NITROSTAT Place 0.4 mg under the tongue every 5 (five) minutes as needed for chest pain.   oxyCODONE 15 mg 12 hr tablet Commonly known as:  OXYCONTIN Take 1 tablet (15 mg total) by mouth every 12 (twelve) hours for 7 days.   rosuvastatin 20 MG tablet Commonly known as:  CRESTOR Take 20 mg by mouth at bedtime.   vitamin C 500 MG tablet Commonly known as:  ASCORBIC ACID Take 500 mg by mouth daily.   Vitamin D3 50 MCG (2000 UT) Tabs Take 2,000 Units by mouth daily.            Durable Medical Equipment  (From admission, onward)         Start     Ordered   10/25/18 1327  DME  Walker rolling  Once    Question:  Patient needs a walker to treat with the following condition  Answer:  Status post total knee replacement using cement, left   10/25/18 1326   10/25/18 1327  DME 3 n 1  Once     10/25/18 1326   10/25/18 1327  DME Bedside commode  Once    Question:  Patient needs a  bedside commode to treat with the following condition  Answer:  Status post total knee replacement using cement, left   10/25/18 1326          Diagnostic Studies: Dg Knee 1-2 Views Left  Result Date: 10/25/2018 CLINICAL DATA:  Status post left total knee replacement EXAM: LEFT KNEE - 1-2 VIEW COMPARISON:  None. FINDINGS: Status post left knee total arthroplasty. Component positioning appears appropriate without evidence of perihardware fracture. Expected postoperative changes overlying the left knee. IMPRESSION: Status post left knee total arthroplasty. Component positioning appears appropriate without evidence of perihardware fracture. Expected postoperative changes overlying the left knee. Electronically Signed   By: Eddie Candle M.D.   On: 10/25/2018 10:42    Disposition: Discharge disposition: 01-Home or Self Care         Follow-up Information    Duanne Guess, PA-C Follow up in 2 week(s).   Specialties:  Orthopedic Surgery, Emergency Medicine Contact information: [place] [address] [phone]            Signed: Reche Dixon 10/29/2018, 7:14 AM

## 2018-10-28 NOTE — TOC Progression Note (Signed)
Transition of Care [facility]) - Progression Note    Patient Details  Name: Renee [name] MRN: [id number] Date of Birth: 18-Jul-1950  Transition of [facility]) CM/SW [place], RN Phone Number: 10/28/2018, 10:10 AM  Clinical Narrative:     Spoke with the patient concerning her DC plan, She is still animate that she is not going to go to rehab and that she is going home.  I encouraged her to reconsider, she said no way  Expected Discharge Plan: [place] Barriers to Discharge: Continued Medical Work up  Expected Discharge Plan and Services Expected Discharge Plan: [place]   Discharge Planning Services: CM Consult Post Acute Care Choice: [place] arrangements for the past 2 months: Single Family Home Expected Discharge Date: 10/27/18               DME Arranged: Berta Minor rolling DME Agency: [employer] Date DME Agency Contacted: 10/26/18 Time DME Agency Contacted: [phone] Representative spoke with at DME Agency: [place]: PT [place]: [facility] [employer]) Date [place]: 10/26/18 Time [place]: 54 Representative spoke with at [place]: [place] ([place]) Interventions    Readmission Risk Interventions No flowsheet data found.

## 2018-10-28 NOTE — Discharge Instructions (Signed)

## 2018-10-28 NOTE — Evaluation (Signed)
Occupational Therapy Evaluation Patient Details Name: Renee [name] MRN: [id number] DOB: May 27, 1951 Today's Date: 10/28/2018    History of Present Illness Pt is 68 yo female s/p L TKA, WBAT. Current smoker, DM, HTN, GERD, CABG   Clinical Impression   Pt seen for OT evaluation/co-tx with PT this date, POD#1 from above surgery. Pt was independent in all ADL prior to surgery. Pt is eager to return to PLOF with less pain and improved safety and independence. Pt currently requires Mod x2 assist for functional STS transfers for LB ADL due to pain and limited AROM of L knee with minimal weight bearing despite max verbal cues. Pt would benefit from instruction in polar care mgt, falls prevention strategies, home/routines modifications, DME/AE for LB bathing and dressing tasks, and compression stocking mgt to maximize safety, independence, and minimize falls risk and caregiver burden. Pt requires significant assist which is far from her baseline. Strongly recommend STR at this time.    Follow Up Recommendations  SNF    Equipment Recommendations  3 in 1 bedside commode    Recommendations for Other Services       Precautions / Restrictions Precautions Precautions: Fall Restrictions Weight Bearing Restrictions: Yes LLE Weight Bearing: Weight bearing as tolerated      Mobility Bed Mobility Overal bed mobility: Needs Assistance Bed Mobility: Supine to Sit     Supine to sit: Min assist;Mod assist     General bed mobility comments: min-mod for LLE mgt to get EOB  Transfers Overall transfer level: Needs assistance Equipment used: Rolling walker (2 wheeled) Transfers: Sit to/from Stand Sit to Stand: Mod assist;+2 physical assistance         General transfer comment: Max VCs for safety/technique    Balance Overall balance assessment: Needs assistance Sitting-balance support: Feet supported Sitting balance-Leahy Scale: Fair   Postural control: Posterior lean Standing balance  support: Bilateral upper extremity supported Standing balance-Leahy Scale: Poor Standing balance comment: requires BUE support on RW, physical assist at times to correct for LOB posteriorly with weightbearing through heels and decr weightbearing through LLE                           ADL either performed or assessed with clinical judgement   ADL Overall ADL's : Needs assistance/impaired Eating/Feeding: Sitting;Independent   Grooming: Sitting;Independent   Upper Body Bathing: Sitting;Set up;Min guard   Lower Body Bathing: Sit to/from stand;+2 for physical assistance;Moderate assistance;Maximal assistance   Upper Body Dressing : Sitting;Set up;Min guard   Lower Body Dressing: Sit to/from stand;+2 for physical assistance;Maximal assistance;Moderate assistance   Toilet Transfer: Moderate assistance;+2 for physical assistance;BSC;Ambulation;RW Toilet Transfer Details (indicate cue type and reason): max VCs for technique, sequencing                 Vision Patient Visual Report: No change from baseline Vision Assessment?: No apparent visual deficits     Perception     Praxis      Pertinent Vitals/Pain Pain Assessment: Faces Faces Pain Scale: Hurts little more Pain Location: denies pain at rest, increased with movement Pain Descriptors / Indicators: Guarding;Moaning;Grimacing Pain Intervention(s): Limited activity within patient's tolerance;Monitored during session;Repositioned;Ice applied     Hand Dominance Right   Extremity/Trunk Assessment Upper Extremity Assessment Upper Extremity Assessment: Generalized weakness   Lower Extremity Assessment Lower Extremity Assessment: Generalized weakness;LLE deficits/detail LLE Deficits / Details: s/p L TKA       Communication Communication Communication: No difficulties   Cognition  Arousal/Alertness: Awake/alert Behavior During Therapy: WFL for tasks assessed/performed Overall Cognitive Status: No  family/caregiver present to determine baseline cognitive functioning                                 General Comments: Pt alert, oriented x3, increased response time, processing, max verbal cues for carryover during session, follows commands w/ increased time and cues   General Comments       Exercises Other Exercises Other Exercises: functional transfer training for ADL    Shoulder Instructions      Home Living Family/patient expects to be discharged to:: Private residence Living Arrangements: Spouse/significant other Available Help at Discharge: Family;Available 24 hours/day Type of Home: House Home Access: Ramped entrance     Home Layout: One level     Bathroom Shower/Tub: Teacher, early years/pre: Handicapped height     Home Equipment: Clinical cytogeneticist - 2 wheels;Cane - single point;Grab bars - tub/shower          Prior Functioning/Environment Level of Independence: Independent                 OT Problem List: Decreased strength;Decreased knowledge of use of DME or AE;Decreased range of motion;Decreased cognition;Decreased activity tolerance;Pain;Impaired balance (sitting and/or standing);Decreased safety awareness      OT Treatment/Interventions: Self-care/ADL training;Balance training;Therapeutic exercise;Therapeutic activities;DME and/or AE instruction;Patient/family education    OT Goals(Current goals can be found in the care plan section) Acute Rehab OT Goals Patient Stated Goal: Pt wants to go home OT Goal Formulation: With patient Time For Goal Achievement: 11/11/18 Potential to Achieve Goals: Fair ADL Goals Pt Will Perform Lower Body Dressing: with min assist;with mod assist;sitting/lateral leans Pt Will Transfer to Toilet: bedside commode;with +2 assist;with min assist;stand pivot transfer Additional ADL Goal #1: Pt will instruct family/caregiver in compression stocking mgt including wear schedule, positioning, and  donning/doffing with <25% verbal cues for proper technique/instruction. Additional ADL Goal #2: Pt will instruct family/caregiver in polar care mgt including wear schedule, positioning, and donning/doffing with <25% verbal cues for proper technique/instruction.  OT Frequency: Min 1X/week   Barriers to D/C:            Co-evaluation PT/OT/SLP Co-Evaluation/Treatment: Yes Reason for Co-Treatment: For patient/therapist safety;To address functional/ADL transfers PT goals addressed during session: Mobility/safety with mobility;Balance;Proper use of DME;Strengthening/ROM OT goals addressed during session: Strengthening/ROM;Proper use of Adaptive equipment and DME;ADL's and self-care      AM-PAC OT "6 Clicks" Daily Activity     Outcome Measure Help from another person eating meals?: None Help from another person taking care of personal grooming?: None Help from another person toileting, which includes using toliet, bedpan, or urinal?: A Lot Help from another person bathing (including washing, rinsing, drying)?: A Lot Help from another person to put on and taking off regular upper body clothing?: A Little Help from another person to put on and taking off regular lower body clothing?: A Lot 6 Click Score: 17   End of Session Equipment Utilized During Treatment: Gait belt;Rolling walker  Activity Tolerance: Patient tolerated treatment well Patient left: in chair;with call bell/phone within reach;with chair alarm set;Other (comment)(with PT in room for further treatment)  OT Visit Diagnosis: Other abnormalities of gait and mobility (R26.89);Muscle weakness (generalized) (M62.81);Pain Pain - Right/Left: Left Pain - part of body: Knee                Time: 3474-2595 OT Time Calculation (  min): 25 min Charges:  OT General Charges $OT Visit: 1 Visit OT Evaluation $OT Eval Moderate Complexity: 1 Mod OT Treatments $Therapeutic Activity: 8-22 mins  Jeni Salles, MPH, MS, OTR/L ascom  [phone] 10/28/18, 1:11 PM

## 2018-10-28 NOTE — Progress Notes (Signed)
Physical Therapy Treatment Patient Details Name: Renee [name] MRN: [id number] DOB: 06-24-1950 Today's Date: 10/28/2018    History of Present Illness Pt is 68 yo female s/p L TKA, WBAT. Current smoker, DM, HTN, GERD, CABG    PT Comments    Pt reporting minimal L knee pain at rest beginning of session but increased to 8/10 by end of session (nurse notified).  Improved ability to perform semi-supine to sit on own (with increased effort/time) but pt was not able to stand from bed (at normal height) with 1 assist and max cueing for technique.  Bed height significantly elevated and then able to stand with 1 assist.  Pt able to march in place with L LE x5 reps but initially unable to take any steps in place with R LE (with max cueing and use of walker).  On 2nd trial pt eventually able to march in place x2 reps with R LE (max assist, max cueing, and L knee blocked) but activity overall limited d/t fatigue so pt assisted back to bed.  Discussed/educated pt on concerns regarding pt's current mobility assist levels and being able to safely manage with mobility at home but pt reports she and her husband will figure it out at home and did not appear concerned.  Will continue to focus on strengthening, ROM, and progressive functional mobility per pt tolerance.  Continue to strongly recommend STR.  Recommend ambulance transport upon hospital discharge.    Follow Up Recommendations  SNF     Equipment Recommendations  Rolling walker with 5" wheels;3in1 (PT);Wheelchair (measurements PT);Wheelchair cushion (measurements PT);Other (comment)(hoyer lift)    Recommendations for Other Services OT consult     Precautions / Restrictions Precautions Precautions: Fall Restrictions Weight Bearing Restrictions: Yes LLE Weight Bearing: Weight bearing as tolerated    Mobility  Bed Mobility Overal bed mobility: Needs Assistance Bed Mobility: Supine to Sit;Sit to Supine     Supine to sit: Supervision;HOB  elevated(increased effort and time to perform on own) Sit to supine: Min assist;Mod assist;HOB elevated(assist for L LE ; increased effort/time to perform)   General bed mobility comments: vc's for technique  Transfers Overall transfer level: Needs assistance Equipment used: Rolling walker (2 wheeled) Transfers: Sit to/from Stand Sit to Stand: Mod assist;Max assist;From elevated surface         General transfer comment: unable to stand with 1 assist from bed in regular height so bed height raised significantly to allow for mod assist x1 to stand up to RW (x2 trials); max cueing for UE/LE positioning and technique  Ambulation/Gait Ambulation/Gait assistance: Max assist Gait Distance (Feet): (marching in place) Assistive device: Rolling walker (2 wheeled)   Gait velocity: decreased   General Gait Details: pt able to march in place x5 reps L LE but unable to take any steps with R LE with max cueing; pt sat for rest break d/t fatigue; with max cueing for technique pt eventually able to take 2 steps in place with R LE (L knee blocked to prevent buckling) but pt fatigued with this activity so pt assisted back to sitting on edge of bed   Stairs             Wheelchair Mobility    Modified Rankin (Stroke Patients Only)       Balance Overall balance assessment: Needs assistance Sitting-balance support: No upper extremity supported;Feet supported Sitting balance-Leahy Scale: Fair Sitting balance - Comments: steady static sitting Postural control: Posterior lean Standing balance support: Bilateral upper extremity supported  Standing balance-Leahy Scale: Poor Standing balance comment: Posterior lean initially with standing requiring cueing and assist to shift weight forward                            Cognition Arousal/Alertness: Awake/alert Behavior During Therapy: WFL for tasks assessed/performed Overall Cognitive Status: No family/caregiver present to determine  baseline cognitive functioning                                 General Comments: Increased time to respond and process info; max vc's for carryover during session      Exercises Total Joint Exercises Heel Slides: AAROM;Strengthening;Left;10 reps;Supine Long Arc Quad: AAROM;Strengthening;Left;10 reps;Seated Knee Flexion: AAROM;Strengthening;Left;10 reps;Seated Goniometric ROM: L knee extension 12 degrees short of neutral; L knee flexion AAROM 75 degrees sitting edge of recliner General Exercises - Lower Extremity Hip Flexion/Marching: AAROM;Strengthening;Left;10 reps;Seated(pt initially attempted to help perform ex's with B UE's but quickly fatigued)    General Comments   Nursing cleared pt for participation in physical therapy.  Pt agreeable to PT session.      Pertinent Vitals/Pain Pain Assessment: 0-10 Pain Score: 8  Pain Location: L knee Pain Descriptors / Indicators: Aching;Tender;Sore;Guarding Pain Intervention(s): Limited activity within patient's tolerance;Monitored during session;Premedicated before session;Repositioned;Other (comment)(polar care applied and activated)    Home Living                      Prior Function            PT Goals (current goals can now be found in the care plan section) Acute Rehab PT Goals Patient Stated Goal: to go home PT Goal Formulation: With patient Time For Goal Achievement: 11/09/18 Potential to Achieve Goals: Fair Progress towards PT goals: Progressing toward goals    Frequency    BID      PT Plan Current plan remains appropriate    Co-evaluation PT/OT/SLP Co-Evaluation/Treatment: Yes Reason for Co-Treatment: For patient/therapist safety;To address functional/ADL transfers PT goals addressed during session: Mobility/safety with mobility;Balance;Proper use of DME;Strengthening/ROM OT goals addressed during session: Strengthening/ROM;Proper use of Adaptive equipment and DME;ADL's and self-care       AM-PAC PT "6 Clicks" Mobility   Outcome Measure  Help needed turning from your back to your side while in a flat bed without using bedrails?: A Little Help needed moving from lying on your back to sitting on the side of a flat bed without using bedrails?: A Little Help needed moving to and from a bed to a chair (including a wheelchair)?: Total Help needed standing up from a chair using your arms (e.g., wheelchair or bedside chair)?: Total Help needed to walk in hospital room?: Total Help needed climbing 3-5 steps with a railing? : Total 6 Click Score: 10    End of Session Equipment Utilized During Treatment: Gait belt Activity Tolerance: Patient limited by pain;Patient limited by fatigue Patient left: in bed;with call bell/phone within reach;with bed alarm set;with SCD's reapplied;Other (comment)(L LE elevated via bone foam; R heel elevated via towel roll) Nurse Communication: Mobility status;Precautions;Weight bearing status PT Visit Diagnosis: Difficulty in walking, not elsewhere classified (R26.2);Pain;Other abnormalities of gait and mobility (R26.89) Pain - Right/Left: Left Pain - part of body: Knee     Time: 2263-3354 PT Time Calculation (min) (ACUTE ONLY): 33 min  Charges:  $Therapeutic Activity: 23-37 mins  Leitha Bleak, PT 10/28/18, 4:35 PM [phone]

## 2018-10-29 LAB — GLUCOSE, CAPILLARY
Glucose-Capillary: 110 mg/dL — ABNORMAL HIGH (ref 70–99)
Glucose-Capillary: 137 mg/dL — ABNORMAL HIGH (ref 70–99)

## 2018-10-29 LAB — TYPE AND SCREEN
ABO/RH(D): O POS
Antibody Screen: NEGATIVE
Unit division: 0

## 2018-10-29 LAB — BPAM RBC
Blood Product Expiration Date: 202006122359
ISSUE DATE / TIME: 202005221240
Unit Type and Rh: 5100

## 2018-10-29 MED ORDER — FLEET ENEMA 7-19 GM/118ML RE ENEM: 1.0000 | ENEMA | Freq: Once | RECTAL | Status: DC

## 2018-10-29 MED ORDER — BISACODYL 10 MG RE SUPP
10.0000 mg | Freq: Every day | RECTAL | Status: DC | PRN
  Administered 2018-10-29: 13:00:00 10 mg via RECTAL
  Filled 2018-10-29: qty 1

## 2018-10-29 NOTE — Progress Notes (Signed)
Physical Therapy Treatment Patient Details Name: Renee [name] MRN: [id number] DOB: 02-20-1951 Today's Date: 10/29/2018    History of Present Illness Pt is 68 yo female s/p L TKA, WBAT. Current smoker, DM, HTN, GERD, CABG    PT Comments    Pt in bed expecting discharge home today vs recommendations of SNF.  To edge of bed with increased time but no physical assist.  Stood with mod a x 2 primarily for balance and safety cues.  Pt with hesitancy stepping but was able to turn to recliner.  Before sitting, she was able to walk 6' forward with min a x 2 and generally unsteady.  After seated rest, she was able to ambulate 6' again but during gait tried to use her foot to kick something to the side which she felt was in her way (it was not) which caused her to loose her balance and required increased assist to prevent fall.  Discussed safety and choices and she voiced it was dangerous but would never have done so without [place] there to catch her.    Pt remains with limited mobility and safety concerns.  Pt stated she does not have a wheelchair at home but husband is to borrow one from their church.  Encouraged her to have it in the car upon discharge as she is unable to walk the distance to access her home safely.  They have a ramp.  Pt instructed to notify staff if wheelchair is not obtained.  Voiced understanding and RN aware.  Encouraged +2 assist at all times upon discharge home.  Stated husband and son will be there to help her.  Safety concerns remain regarding discharge but pt continues to refuse SNF.   Follow Up Recommendations  SNF     Equipment Recommendations  Rolling walker with 5" wheels;3in1 (PT);Wheelchair (measurements PT);Wheelchair cushion (measurements PT);Other (comment)    Recommendations for Other Services       Precautions / Restrictions Precautions Precautions: Fall Restrictions Weight Bearing Restrictions: Yes LLE Weight Bearing: Weight bearing as tolerated     Mobility  Bed Mobility Overal bed mobility: Needs Assistance Bed Mobility: Supine to Sit;Sit to Supine     Supine to sit: Supervision;HOB elevated     General bed mobility comments: vc's for technique  Transfers Overall transfer level: Needs assistance Equipment used: Rolling walker (2 wheeled) Transfers: Sit to/from Stand Sit to Stand: Mod assist;From elevated surface;+2 physical assistance            Ambulation/Gait Ambulation/Gait assistance: Mod assist;+2 physical assistance Gait Distance (Feet): 8 Feet Assistive device: Rolling walker (2 wheeled) Gait Pattern/deviations: Step-to pattern Gait velocity: decreased   General Gait Details: very unsteady with poor safety   Stairs             Wheelchair Mobility    Modified Rankin (Stroke Patients Only)       Balance Overall balance assessment: Needs assistance Sitting-balance support: No upper extremity supported;Feet supported Sitting balance-Leahy Scale: Fair Sitting balance - Comments: steady static sitting Postural control: Posterior lean Standing balance support: Bilateral upper extremity supported Standing balance-Leahy Scale: Poor Standing balance comment: Posterior lean initially with standing requiring cueing and assist to shift weight forward                            Cognition Arousal/Alertness: Awake/alert Behavior During Therapy: WFL for tasks assessed/performed Overall Cognitive Status: Within Functional Limits for tasks assessed  General Comments: Increased time to respond and process info; max vc's for carryover during session      Exercises      General Comments        Pertinent Vitals/Pain Pain Assessment: Faces Faces Pain Scale: Hurts a little bit Pain Location: L knee Pain Descriptors / Indicators: Aching;Tender;Sore;Guarding Pain Intervention(s): Limited activity within patient's tolerance;Monitored during  session    Home Living                      Prior Function            PT Goals (current goals can now be found in the care plan section) Progress towards PT goals: Progressing toward goals    Frequency    BID      PT Plan Current plan remains appropriate    Co-evaluation              AM-PAC PT "6 Clicks" Mobility   Outcome Measure  Help needed turning from your back to your side while in a flat bed without using bedrails?: A Little Help needed moving from lying on your back to sitting on the side of a flat bed without using bedrails?: A Little Help needed moving to and from a bed to a chair (including a wheelchair)?: A Lot Help needed standing up from a chair using your arms (e.g., wheelchair or bedside chair)?: A Lot Help needed to walk in hospital room?: A Lot Help needed climbing 3-5 steps with a railing? : Total 6 Click Score: 13    End of Session Equipment Utilized During Treatment: Gait belt Activity Tolerance: Patient tolerated treatment well;Patient limited by fatigue Patient left: in chair;with call bell/phone within reach;with chair alarm set Nurse Communication: Mobility status;Other (comment) Pain - Right/Left: Left Pain - part of body: Knee     Time: 1040-1059 PT Time Calculation (min) (ACUTE ONLY): 19 min  Charges:  $Gait Training: 8-22 mins                     Chesley Noon, PTA 10/29/18, 1:12 PM

## 2018-10-29 NOTE — Progress Notes (Signed)
Subjective: 4 Days Post-Op Procedure(s) (LRB): TOTAL KNEE ARTHROPLASTY - LEFT - DIABETIC (Left) Patient reports pain as mild to moderate but improved compared to yesterday.   Patient is well but Hg 9.1 this AM. Denies any CP, SOB, ABD pain. We will continue with physical therapy today, current recommendation is for discharge to SNF however patient is adamant on returning home.  Objective: Vital signs in last 24 hours: Temp:  [97.5 F (36.4 C)-99.5 F (37.5 C)] 99.2 F (37.3 C) (05/23 0409) Pulse Rate:  [75-104] 88 (05/23 0409) Resp:  [14-19] 16 (05/23 0409) BP: (107-159)/(61-89) 107/61 (05/23 0409) SpO2:  [96 %-100 %] 98 % (05/23 0409)  Intake/Output from previous day: 05/22 0701 - 05/23 0700 In: 1939 [P.O.:120; I.V.:1533.8; Blood:285.2] Out: 350 [Urine:350] Intake/Output this shift: No intake/output data recorded.  Recent Labs    10/27/18 0438 10/28/18 0445 10/28/18 1624  HGB 8.0* 7.4* 9.1*   Recent Labs    10/27/18 0438 10/28/18 0445 10/28/18 1624  WBC 9.8 9.6  --   RBC 2.60* 2.39*  --   HCT 24.8* 22.8* 27.4*  PLT 143* 148*  --    Recent Labs    10/27/18 0438 10/28/18 0445  NA 139 139  K 3.8 3.8  CL 109 107  CO2 22 23  BUN 24* 24*  CREATININE 1.12* 1.03*  GLUCOSE 142* 129*  CALCIUM 8.6* 8.9   No results for input(s): LABPT, INR in the last 72 hours.  EXAM General - Patient is Alert, Appropriate and Oriented Extremity - Neurovascular intact Sensation intact distally Intact pulses distally Dorsiflexion/Plantar flexion intact No cellulitis present Compartment soft Dressing - dressing C/D/I and no drainage wound VAC intact with less than 50 cc of drainage Motor Function - intact, moving foot and toes well on exam.   Past Medical History:  Diagnosis Date  . Arthritis   . Chronic kidney disease    stage 3  . Diabetes mellitus without complication ([place])   . GERD (gastroesophageal reflux disease)    no meds  . Heart murmur   . Hypertension      Assessment/Plan:   4 Days Post-Op Procedure(s) (LRB): TOTAL KNEE ARTHROPLASTY - LEFT - DIABETIC (Left) Active Problems:   Status post total knee replacement using cement, left  Estimated body mass index is 34.52 kg/m as calculated from the following:   Height as of this encounter: 5\' 3"  (1.6 m).   Weight as of this encounter: 88.4 kg. Advance diet Up with therapy  Needs bowel movement, move on to FLEET enema today. Care management to assist with discharge  DVT Prophylaxis - Lovenox, Foot Pumps and TED hose Weight-Bearing as tolerated to left leg  Reche Dixon, PA-C [facility] 10/29/2018, 7:12 AM

## 2018-10-29 NOTE — TOC Transition Note (Signed)
Transition of Care [facility]) - CM/SW Discharge Note   Patient Details  Name: Renee [name] MRN: [id number] Date of Birth: [birth year]  Transition of Care [facility]) CM/SW Contact:  Latanya Maudlin, RN Phone Number: 10/29/2018, 8:34 AM   Clinical Narrative:  Patient to be discharged per MD order. Orders in place for home health services. Patient was previously set up with [facility] for PT services, verified completed referral with Helene Kelp. RW and bedside commode delivered to bedside via [employer]. Patient discharging home with spouse and son, family to transport.   Final next level of care: [place] Barriers to Discharge: No Barriers Identified   Patient Goals and CMS Choice Patient states their goals for this hospitalization and ongoing recovery are:: to go home CMS Medicare.gov Compare Post Acute Care list provided to:: Patient Choice offered to / list presented to : Patient  Discharge Placement                       Discharge Plan and Services   Discharge Planning Services: CM Consult Post Acute Care Choice: Home Health          DME Arranged: 3-N-1, Walker rolling DME Agency: [employer] Date DME Agency Contacted: 10/26/18 Time DME Agency Contacted: [phone] Representative spoke with at DME Agency: [place]: PT [place]: [facility] (formerly [employer]) Date [place]: 10/29/18 Time [place]: [phone] Representative spoke with at [place]: teresa  Social Determinants of Health ([place]) Interventions     Readmission Risk Interventions Readmission Risk Prevention Plan 10/29/2018  Post Dischage Appt Complete  Medication Screening Complete  Transportation Screening Complete  Some recent data might be hidden

## 2018-10-29 NOTE — Progress Notes (Signed)
Discharge instructions and prescriptions given to pt. IVs removed. Wound vac changed to prevena. [place]comb dressing sent home with pt. Pt getting dressed and awaiting ride home with husband.

## 2018-11-02 DIAGNOSIS — Z7901 Long term (current) use of anticoagulants: Secondary | ICD-10-CM | POA: Diagnosis not present

## 2018-11-02 DIAGNOSIS — Z471 Aftercare following joint replacement surgery: Secondary | ICD-10-CM | POA: Diagnosis not present

## 2018-11-02 DIAGNOSIS — K219 Gastro-esophageal reflux disease without esophagitis: Secondary | ICD-10-CM | POA: Diagnosis not present

## 2018-11-02 DIAGNOSIS — I129 Hypertensive chronic kidney disease with stage 1 through stage 4 chronic kidney disease, or unspecified chronic kidney disease: Secondary | ICD-10-CM | POA: Diagnosis not present

## 2018-11-02 DIAGNOSIS — Z96652 Presence of left artificial knee joint: Secondary | ICD-10-CM | POA: Diagnosis not present

## 2018-11-02 DIAGNOSIS — E1122 Type 2 diabetes mellitus with diabetic chronic kidney disease: Secondary | ICD-10-CM | POA: Diagnosis not present

## 2018-11-02 DIAGNOSIS — N183 Chronic kidney disease, stage 3 (moderate): Secondary | ICD-10-CM | POA: Diagnosis not present

## 2018-11-02 DIAGNOSIS — Z7984 Long term (current) use of oral hypoglycemic drugs: Secondary | ICD-10-CM | POA: Diagnosis not present

## 2018-11-02 LAB — GLUCOSE, CAPILLARY
Glucose-Capillary: 114 mg/dL — ABNORMAL HIGH (ref 70–99)
Glucose-Capillary: 123 mg/dL — ABNORMAL HIGH (ref 70–99)
Glucose-Capillary: 115 mg/dL — ABNORMAL HIGH (ref 70–99)

## 2018-11-06 NOTE — H&P (Signed)
History of Present Illness: Renee [name] is a 68 y.o. female who comes to clinic today for discussion of left total knee arthroplasty. The patient had her My Knee CT completed. She reports that her left knee symptoms have not improved since her last visit. The patient brings her husband with her today. She has been taking hydrocodone for discomfort.   Past Medical History: Past Medical History:  Diagnosis Date  . Angina pectoris (CMS-HCC)  . Arthritis  . CAD (coronary artery disease)  status post CABG one vessel bypass in 1996. No coronary stent placement.  . Diabetes mellitus type 2, uncomplicated (CMS-HCC)  . DJD (degenerative joint disease)  . Heart murmur, unspecified  . Hypercholesterolemia  . Hypertension  . Neuropathy  . Smoking  . Syncope   Past Surgical History: Past Surgical History:  Procedure Laterality Date  . CABG with PCI and stent  . Carpal tunnel surgery Bilateral  . COLONOSCOPY 02/24/2017  Tubular adenoma of colon/Repeat 50yrs/TKT  . CORONARY ARTERY BYPASS GRAFT 1996  one vessel bypass  . HERNIA REPAIR  umbilical  . HYSTERECTOMY  partial  . Right breast biopsy   Past Family History: Family History  Problem Relation Age of Onset  . Colon polyps Sister  . Colon polyps Sister   Medications: Current Outpatient Medications Ordered in Epic  Medication Sig Dispense Refill  . ascorbic acid, vitamin C, (VITAMIN C) 500 MG tablet Take 500 mg by mouth once daily.  Marland Kitchen aspirin 81 MG EC tablet Take 81 mg by mouth once daily.  . carvedilol (COREG) 6.25 MG tablet Take 1 tablet (6.25 mg total) by mouth 2 (two) times daily 180 tablet 1  . cholecalciferol (VITAMIN D3) 2,000 unit capsule Take 2,000 Units by mouth once daily.  Marland Kitchen CINNAMON BARK ORAL Take 1,000 mg by mouth once daily  . FUROsemide (LASIX) 20 MG tablet Take 1 tablet (20 mg total) by mouth once daily 90 tablet 1  . gabapentin (NEURONTIN) 800 MG tablet TAKE 1 TABLET BY MOUTH EVERY MORNING AND 1 TAB IN AFTERNOON  AND 2 TABS AT NIGHT 120 tablet 5  . HYDROcodone-acetaminophen (NORCO) 5-325 mg tablet Take 1 tablet by mouth every 8 (eight) hours as needed for Pain 45 tablet 0  . lisinopril-hydrochlorothiazide (PRINZIDE,ZESTORETIC) 20-12.5 mg tablet TAKE 1 TABLET BY MOUTH EVERY DAY 90 tablet 3  . loratadine (CLARITIN) 10 mg tablet TAKE 1 TABLET BY MOUTH EVERY DAY 90 tablet 3  . metFORMIN (GLUCOPHAGE-XR) 500 MG XR tablet TAKE 1 TABLET BY MOUTH EVERY DAY 90 tablet 0  . nitroGLYcerin (NITROSTAT) 0.4 MG SL tablet PLACE 1 TABLET UNDER TONGUE EVERY 5 MINUTES AS NEEDED FOR CHEST PAIN - CHEST NOT EXCEED 3 DOSES 25 tablet 2  . omega 3-dha-epa-fish oil 1,000 (120-180) mg Cap Take 3 capsules by mouth once daily. Once a day.  . rosuvastatin (CRESTOR) 20 MG tablet TAKE 1 TABLET BY MOUTH EVERY DAY AT BEDTIME 90 tablet 1  . tiZANidine (ZANAFLEX) 2 MG tablet Take 1 tablet (2 mg total) by mouth 3 (three) times daily as needed for Muscle spasms 60 tablet 1   No current Epic-ordered facility-administered medications on file.   Allergies: Allergies  Allergen Reactions  . Codeine Phosphate Vomiting  . Tramadol Unknown  Nausea    Body mass index is 33.83 kg/m.  Review of Systems:  A comprehensive 14 point ROS was performed, reviewed, and the pertinent orthopaedic findings are documented in the HPI.  Physical Exam: General/Constitutional: No apparent distress: well-nourished and well developed.  Eyes: Pupils equal, round with synchronous movement. Lungs: Clear to auscultation HEENT: Normal Vascular: No edema, swelling or tenderness, except as noted in detailed exam. Cardiac: Heart rate and rhythm is regular. Integumentary: No impressive skin lesions present, except as noted in detailed exam. Neuro/Psych: Normal mood and affect, oriented to person, place and time.  Vitals:  08/24/18 0949  BP: 138/80   Orthopaedic Examination: Right Left   Gait Antalgic  Alignment Normal  Inspection Normal Large effusion   Palpation Knee Normal Normal  Range of Motion Knee Normal 0-100 degrees  Strength Normal Normal  Meniscus Exam Normal Normal  Ligament Exam Normal Normal  Patella Exam Normal Normal  Reflexes Normal Normal  Neurologic Normal Normal   Imaging Studies: No new imaging studies were obtained today.  Assessment:  ICD-10-CM  1. Primary osteoarthritis of left knee M17.12    Plan: The findings of today's exam were discussed with the patient and family which include:  Left total knee scheduled.  The patient was electronically prescribed hydrocodone for pain today. She may get a refill prior to surgery if needed.   The patient will call with any questions or concerns that may arise.

## 2018-11-08 DIAGNOSIS — N183 Chronic kidney disease, stage 3 (moderate): Secondary | ICD-10-CM | POA: Diagnosis not present

## 2018-11-08 DIAGNOSIS — Z471 Aftercare following joint replacement surgery: Secondary | ICD-10-CM | POA: Diagnosis not present

## 2018-11-08 DIAGNOSIS — K219 Gastro-esophageal reflux disease without esophagitis: Secondary | ICD-10-CM | POA: Diagnosis not present

## 2018-11-08 DIAGNOSIS — E1122 Type 2 diabetes mellitus with diabetic chronic kidney disease: Secondary | ICD-10-CM | POA: Diagnosis not present

## 2018-11-08 DIAGNOSIS — Z7984 Long term (current) use of oral hypoglycemic drugs: Secondary | ICD-10-CM | POA: Diagnosis not present

## 2018-11-08 DIAGNOSIS — Z96652 Presence of left artificial knee joint: Secondary | ICD-10-CM | POA: Diagnosis not present

## 2018-11-08 DIAGNOSIS — Z7901 Long term (current) use of anticoagulants: Secondary | ICD-10-CM | POA: Diagnosis not present

## 2018-11-08 DIAGNOSIS — I129 Hypertensive chronic kidney disease with stage 1 through stage 4 chronic kidney disease, or unspecified chronic kidney disease: Secondary | ICD-10-CM | POA: Diagnosis not present

## 2018-11-09 DIAGNOSIS — M25662 Stiffness of left knee, not elsewhere classified: Secondary | ICD-10-CM | POA: Diagnosis not present

## 2018-11-09 DIAGNOSIS — Z96652 Presence of left artificial knee joint: Secondary | ICD-10-CM | POA: Diagnosis not present

## 2018-11-09 DIAGNOSIS — D62 Acute posthemorrhagic anemia: Secondary | ICD-10-CM | POA: Diagnosis not present

## 2018-11-09 DIAGNOSIS — I2510 Atherosclerotic heart disease of native coronary artery without angina pectoris: Secondary | ICD-10-CM | POA: Diagnosis not present

## 2018-11-09 DIAGNOSIS — M6281 Muscle weakness (generalized): Secondary | ICD-10-CM | POA: Diagnosis not present

## 2018-11-09 DIAGNOSIS — I10 Essential (primary) hypertension: Secondary | ICD-10-CM | POA: Diagnosis not present

## 2018-11-09 DIAGNOSIS — M25562 Pain in left knee: Secondary | ICD-10-CM | POA: Diagnosis not present

## 2018-11-09 DIAGNOSIS — E1142 Type 2 diabetes mellitus with diabetic polyneuropathy: Secondary | ICD-10-CM | POA: Diagnosis not present

## 2018-11-09 DIAGNOSIS — G8929 Other chronic pain: Secondary | ICD-10-CM | POA: Diagnosis not present

## 2018-11-09 DIAGNOSIS — E7800 Pure hypercholesterolemia, unspecified: Secondary | ICD-10-CM | POA: Diagnosis not present

## 2018-11-09 DIAGNOSIS — G629 Polyneuropathy, unspecified: Secondary | ICD-10-CM | POA: Diagnosis not present

## 2018-11-09 DIAGNOSIS — M1712 Unilateral primary osteoarthritis, left knee: Secondary | ICD-10-CM | POA: Diagnosis not present

## 2018-11-09 DIAGNOSIS — N183 Chronic kidney disease, stage 3 (moderate): Secondary | ICD-10-CM | POA: Diagnosis not present

## 2018-11-11 DIAGNOSIS — M25562 Pain in left knee: Secondary | ICD-10-CM | POA: Diagnosis not present

## 2018-11-11 DIAGNOSIS — Z96652 Presence of left artificial knee joint: Secondary | ICD-10-CM | POA: Diagnosis not present

## 2018-11-15 DIAGNOSIS — M25562 Pain in left knee: Secondary | ICD-10-CM | POA: Diagnosis not present

## 2018-11-18 DIAGNOSIS — Z96652 Presence of left artificial knee joint: Secondary | ICD-10-CM | POA: Diagnosis not present

## 2018-11-18 DIAGNOSIS — M25562 Pain in left knee: Secondary | ICD-10-CM | POA: Diagnosis not present

## 2018-11-22 DIAGNOSIS — G8929 Other chronic pain: Secondary | ICD-10-CM | POA: Diagnosis not present

## 2018-11-22 DIAGNOSIS — M6281 Muscle weakness (generalized): Secondary | ICD-10-CM | POA: Diagnosis not present

## 2018-11-22 DIAGNOSIS — M25562 Pain in left knee: Secondary | ICD-10-CM | POA: Diagnosis not present

## 2018-11-22 DIAGNOSIS — Z96652 Presence of left artificial knee joint: Secondary | ICD-10-CM | POA: Diagnosis not present

## 2018-11-22 DIAGNOSIS — M25662 Stiffness of left knee, not elsewhere classified: Secondary | ICD-10-CM | POA: Diagnosis not present

## 2018-11-24 DIAGNOSIS — Z96652 Presence of left artificial knee joint: Secondary | ICD-10-CM | POA: Diagnosis not present

## 2018-11-29 DIAGNOSIS — E1142 Type 2 diabetes mellitus with diabetic polyneuropathy: Secondary | ICD-10-CM | POA: Diagnosis not present

## 2018-11-29 DIAGNOSIS — N183 Chronic kidney disease, stage 3 (moderate): Secondary | ICD-10-CM | POA: Diagnosis not present

## 2018-11-29 DIAGNOSIS — E7800 Pure hypercholesterolemia, unspecified: Secondary | ICD-10-CM | POA: Diagnosis not present

## 2018-11-29 DIAGNOSIS — Z96652 Presence of left artificial knee joint: Secondary | ICD-10-CM | POA: Diagnosis not present

## 2018-11-29 DIAGNOSIS — M25562 Pain in left knee: Secondary | ICD-10-CM | POA: Diagnosis not present

## 2018-12-01 DIAGNOSIS — M6281 Muscle weakness (generalized): Secondary | ICD-10-CM | POA: Diagnosis not present

## 2018-12-01 DIAGNOSIS — M25562 Pain in left knee: Secondary | ICD-10-CM | POA: Diagnosis not present

## 2018-12-01 DIAGNOSIS — G8929 Other chronic pain: Secondary | ICD-10-CM | POA: Diagnosis not present

## 2018-12-01 DIAGNOSIS — M25662 Stiffness of left knee, not elsewhere classified: Secondary | ICD-10-CM | POA: Diagnosis not present

## 2018-12-01 DIAGNOSIS — Z96652 Presence of left artificial knee joint: Secondary | ICD-10-CM | POA: Diagnosis not present

## 2018-12-06 DIAGNOSIS — E7800 Pure hypercholesterolemia, unspecified: Secondary | ICD-10-CM | POA: Diagnosis not present

## 2018-12-06 DIAGNOSIS — I10 Essential (primary) hypertension: Secondary | ICD-10-CM | POA: Diagnosis not present

## 2018-12-06 DIAGNOSIS — E1142 Type 2 diabetes mellitus with diabetic polyneuropathy: Secondary | ICD-10-CM | POA: Diagnosis not present

## 2018-12-06 DIAGNOSIS — G629 Polyneuropathy, unspecified: Secondary | ICD-10-CM | POA: Diagnosis not present

## 2018-12-06 DIAGNOSIS — M25562 Pain in left knee: Secondary | ICD-10-CM | POA: Diagnosis not present

## 2018-12-06 DIAGNOSIS — G8929 Other chronic pain: Secondary | ICD-10-CM | POA: Diagnosis not present

## 2018-12-06 DIAGNOSIS — M545 Low back pain: Secondary | ICD-10-CM | POA: Diagnosis not present

## 2018-12-06 DIAGNOSIS — Z96652 Presence of left artificial knee joint: Secondary | ICD-10-CM | POA: Diagnosis not present

## 2018-12-06 DIAGNOSIS — I2510 Atherosclerotic heart disease of native coronary artery without angina pectoris: Secondary | ICD-10-CM | POA: Diagnosis not present

## 2018-12-06 DIAGNOSIS — N183 Chronic kidney disease, stage 3 (moderate): Secondary | ICD-10-CM | POA: Diagnosis not present

## 2018-12-07 DIAGNOSIS — M1712 Unilateral primary osteoarthritis, left knee: Secondary | ICD-10-CM | POA: Diagnosis not present

## 2018-12-08 DIAGNOSIS — Z96652 Presence of left artificial knee joint: Secondary | ICD-10-CM | POA: Diagnosis not present

## 2018-12-08 DIAGNOSIS — M25562 Pain in left knee: Secondary | ICD-10-CM | POA: Diagnosis not present

## 2018-12-08 DIAGNOSIS — M25662 Stiffness of left knee, not elsewhere classified: Secondary | ICD-10-CM | POA: Diagnosis not present

## 2018-12-08 DIAGNOSIS — M6281 Muscle weakness (generalized): Secondary | ICD-10-CM | POA: Diagnosis not present

## 2018-12-08 DIAGNOSIS — G8929 Other chronic pain: Secondary | ICD-10-CM | POA: Diagnosis not present

## 2018-12-10 ENCOUNTER — Emergency Department: Payer: PPO

## 2018-12-10 ENCOUNTER — Other Ambulatory Visit: Payer: Self-pay

## 2018-12-10 ENCOUNTER — Encounter: Payer: Self-pay | Admitting: Emergency Medicine

## 2018-12-10 ENCOUNTER — Emergency Department
Admission: EM | Admit: 2018-12-10 | Discharge: 2018-12-10 | Disposition: A | Discharge status: Home / Self Care | Payer: PPO | Attending: Emergency Medicine | Admitting: Emergency Medicine

## 2018-12-10 DIAGNOSIS — M542 Cervicalgia: Secondary | ICD-10-CM | POA: Insufficient documentation

## 2018-12-10 DIAGNOSIS — Z041 Encounter for examination and observation following transport accident: Secondary | ICD-10-CM | POA: Diagnosis not present

## 2018-12-10 DIAGNOSIS — M25562 Pain in left knee: Secondary | ICD-10-CM | POA: Insufficient documentation

## 2018-12-10 DIAGNOSIS — Z96652 Presence of left artificial knee joint: Secondary | ICD-10-CM | POA: Insufficient documentation

## 2018-12-10 DIAGNOSIS — N183 Chronic kidney disease, stage 3 (moderate): Secondary | ICD-10-CM | POA: Insufficient documentation

## 2018-12-10 DIAGNOSIS — Z951 Presence of aortocoronary bypass graft: Secondary | ICD-10-CM | POA: Insufficient documentation

## 2018-12-10 DIAGNOSIS — Z79899 Other long term (current) drug therapy: Secondary | ICD-10-CM | POA: Insufficient documentation

## 2018-12-10 DIAGNOSIS — F17210 Nicotine dependence, cigarettes, uncomplicated: Secondary | ICD-10-CM | POA: Diagnosis not present

## 2018-12-10 DIAGNOSIS — S3992XA Unspecified injury of lower back, initial encounter: Secondary | ICD-10-CM | POA: Diagnosis not present

## 2018-12-10 DIAGNOSIS — E1122 Type 2 diabetes mellitus with diabetic chronic kidney disease: Secondary | ICD-10-CM | POA: Diagnosis not present

## 2018-12-10 DIAGNOSIS — Z7982 Long term (current) use of aspirin: Secondary | ICD-10-CM | POA: Insufficient documentation

## 2018-12-10 DIAGNOSIS — S199XXA Unspecified injury of neck, initial encounter: Secondary | ICD-10-CM | POA: Diagnosis not present

## 2018-12-10 DIAGNOSIS — Z794 Long term (current) use of insulin: Secondary | ICD-10-CM | POA: Diagnosis not present

## 2018-12-10 DIAGNOSIS — M545 Low back pain: Secondary | ICD-10-CM | POA: Insufficient documentation

## 2018-12-10 DIAGNOSIS — S8992XA Unspecified injury of left lower leg, initial encounter: Secondary | ICD-10-CM | POA: Diagnosis not present

## 2018-12-10 DIAGNOSIS — I129 Hypertensive chronic kidney disease with stage 1 through stage 4 chronic kidney disease, or unspecified chronic kidney disease: Secondary | ICD-10-CM | POA: Diagnosis not present

## 2018-12-10 MED ORDER — ACETAMINOPHEN 325 MG PO TABS
650.0000 mg | ORAL_TABLET | Freq: Once | ORAL | Status: AC
  Administered 2018-12-10: 650 mg via ORAL
  Filled 2018-12-10: qty 2

## 2018-12-10 MED ORDER — CYCLOBENZAPRINE HCL 5 MG PO TABS: 5.0000 mg | ORAL_TABLET | Freq: Three times a day (TID) | ORAL | 0 refills | Status: AC | PRN
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
## 2018-12-13 DIAGNOSIS — B351 Tinea unguium: Secondary | ICD-10-CM | POA: Diagnosis not present

## 2018-12-13 DIAGNOSIS — G8929 Other chronic pain: Secondary | ICD-10-CM | POA: Diagnosis not present

## 2018-12-13 DIAGNOSIS — S161XXA Strain of muscle, fascia and tendon at neck level, initial encounter: Secondary | ICD-10-CM | POA: Diagnosis not present

## 2018-12-13 DIAGNOSIS — M25562 Pain in left knee: Secondary | ICD-10-CM | POA: Diagnosis not present

## 2018-12-13 DIAGNOSIS — M79675 Pain in left toe(s): Secondary | ICD-10-CM | POA: Diagnosis not present

## 2018-12-15 DIAGNOSIS — M25562 Pain in left knee: Secondary | ICD-10-CM | POA: Diagnosis not present

## 2018-12-15 DIAGNOSIS — G8929 Other chronic pain: Secondary | ICD-10-CM | POA: Diagnosis not present

## 2018-12-19 DIAGNOSIS — Z96652 Presence of left artificial knee joint: Secondary | ICD-10-CM | POA: Diagnosis not present

## 2018-12-19 DIAGNOSIS — M25562 Pain in left knee: Secondary | ICD-10-CM | POA: Diagnosis not present

## 2018-12-19 DIAGNOSIS — M6281 Muscle weakness (generalized): Secondary | ICD-10-CM | POA: Diagnosis not present

## 2018-12-19 DIAGNOSIS — G8929 Other chronic pain: Secondary | ICD-10-CM | POA: Diagnosis not present

## 2018-12-19 DIAGNOSIS — M25662 Stiffness of left knee, not elsewhere classified: Secondary | ICD-10-CM | POA: Diagnosis not present

## 2018-12-21 ENCOUNTER — Other Ambulatory Visit: Payer: Self-pay | Admitting: Internal Medicine

## 2018-12-21 DIAGNOSIS — Z1231 Encounter for screening mammogram for malignant neoplasm of breast: Secondary | ICD-10-CM

## 2018-12-22 DIAGNOSIS — G8929 Other chronic pain: Secondary | ICD-10-CM | POA: Diagnosis not present

## 2018-12-22 DIAGNOSIS — M25562 Pain in left knee: Secondary | ICD-10-CM | POA: Diagnosis not present

## 2018-12-28 DIAGNOSIS — M25562 Pain in left knee: Secondary | ICD-10-CM | POA: Diagnosis not present

## 2018-12-28 DIAGNOSIS — G8929 Other chronic pain: Secondary | ICD-10-CM | POA: Diagnosis not present

## 2019-01-04 DIAGNOSIS — E1142 Type 2 diabetes mellitus with diabetic polyneuropathy: Secondary | ICD-10-CM | POA: Diagnosis not present

## 2019-01-04 DIAGNOSIS — N183 Chronic kidney disease, stage 3 (moderate): Secondary | ICD-10-CM | POA: Diagnosis not present

## 2019-01-04 DIAGNOSIS — G629 Polyneuropathy, unspecified: Secondary | ICD-10-CM | POA: Diagnosis not present

## 2019-01-16 DIAGNOSIS — R011 Cardiac murmur, unspecified: Secondary | ICD-10-CM | POA: Diagnosis not present

## 2019-01-16 DIAGNOSIS — E7800 Pure hypercholesterolemia, unspecified: Secondary | ICD-10-CM | POA: Diagnosis not present

## 2019-01-16 DIAGNOSIS — I2510 Atherosclerotic heart disease of native coronary artery without angina pectoris: Secondary | ICD-10-CM | POA: Diagnosis not present

## 2019-01-16 DIAGNOSIS — R55 Syncope and collapse: Secondary | ICD-10-CM | POA: Diagnosis not present

## 2019-01-16 DIAGNOSIS — F17200 Nicotine dependence, unspecified, uncomplicated: Secondary | ICD-10-CM | POA: Diagnosis not present

## 2019-01-16 DIAGNOSIS — G629 Polyneuropathy, unspecified: Secondary | ICD-10-CM | POA: Diagnosis not present

## 2019-01-16 DIAGNOSIS — I10 Essential (primary) hypertension: Secondary | ICD-10-CM | POA: Diagnosis not present

## 2019-01-16 DIAGNOSIS — R0602 Shortness of breath: Secondary | ICD-10-CM | POA: Diagnosis not present

## 2019-01-16 DIAGNOSIS — E1142 Type 2 diabetes mellitus with diabetic polyneuropathy: Secondary | ICD-10-CM | POA: Diagnosis not present

## 2019-01-16 DIAGNOSIS — I25118 Atherosclerotic heart disease of native coronary artery with other forms of angina pectoris: Secondary | ICD-10-CM | POA: Diagnosis not present

## 2019-02-02 ENCOUNTER — Ambulatory Visit
Admission: RE | Admit: 2019-02-02 | Discharge: 2019-02-02 | Disposition: A | Discharge status: Home / Self Care | Payer: PPO | Source: Ambulatory Visit | Attending: Internal Medicine | Admitting: Internal Medicine

## 2019-02-02 DIAGNOSIS — Z1231 Encounter for screening mammogram for malignant neoplasm of breast: Secondary | ICD-10-CM | POA: Diagnosis not present

## 2019-04-04 DIAGNOSIS — N183 Chronic kidney disease, stage 3 unspecified: Secondary | ICD-10-CM | POA: Diagnosis not present

## 2019-04-04 DIAGNOSIS — E1142 Type 2 diabetes mellitus with diabetic polyneuropathy: Secondary | ICD-10-CM | POA: Diagnosis not present

## 2019-04-04 DIAGNOSIS — G629 Polyneuropathy, unspecified: Secondary | ICD-10-CM | POA: Diagnosis not present

## 2019-04-04 DIAGNOSIS — E7800 Pure hypercholesterolemia, unspecified: Secondary | ICD-10-CM | POA: Diagnosis not present

## 2019-04-08 DIAGNOSIS — Z20828 Contact with and (suspected) exposure to other viral communicable diseases: Secondary | ICD-10-CM | POA: Diagnosis not present

## 2019-04-08 DIAGNOSIS — R05 Cough: Secondary | ICD-10-CM | POA: Diagnosis not present

## 2019-04-08 DIAGNOSIS — J209 Acute bronchitis, unspecified: Secondary | ICD-10-CM | POA: Diagnosis not present

## 2019-04-20 DIAGNOSIS — E1142 Type 2 diabetes mellitus with diabetic polyneuropathy: Secondary | ICD-10-CM | POA: Diagnosis not present

## 2019-04-20 DIAGNOSIS — E1169 Type 2 diabetes mellitus with other specified complication: Secondary | ICD-10-CM | POA: Diagnosis not present

## 2019-04-20 DIAGNOSIS — I2510 Atherosclerotic heart disease of native coronary artery without angina pectoris: Secondary | ICD-10-CM | POA: Diagnosis not present

## 2019-04-20 DIAGNOSIS — B351 Tinea unguium: Secondary | ICD-10-CM | POA: Insufficient documentation

## 2019-04-20 DIAGNOSIS — Z0000 Encounter for general adult medical examination without abnormal findings: Secondary | ICD-10-CM | POA: Diagnosis not present

## 2019-04-20 DIAGNOSIS — Z1159 Encounter for screening for other viral diseases: Secondary | ICD-10-CM | POA: Diagnosis not present

## 2019-04-20 DIAGNOSIS — G629 Polyneuropathy, unspecified: Secondary | ICD-10-CM | POA: Diagnosis not present

## 2019-04-20 DIAGNOSIS — F17210 Nicotine dependence, cigarettes, uncomplicated: Secondary | ICD-10-CM | POA: Diagnosis not present

## 2019-04-20 DIAGNOSIS — N183 Chronic kidney disease, stage 3 unspecified: Secondary | ICD-10-CM | POA: Diagnosis not present

## 2019-04-20 DIAGNOSIS — M545 Low back pain: Secondary | ICD-10-CM | POA: Diagnosis not present

## 2019-04-20 DIAGNOSIS — I10 Essential (primary) hypertension: Secondary | ICD-10-CM | POA: Diagnosis not present

## 2019-04-20 DIAGNOSIS — E7800 Pure hypercholesterolemia, unspecified: Secondary | ICD-10-CM | POA: Diagnosis not present

## 2019-04-20 DIAGNOSIS — Z720 Tobacco use: Secondary | ICD-10-CM | POA: Diagnosis not present

## 2019-04-24 ENCOUNTER — Telehealth: Payer: Self-pay | Admitting: *Deleted

## 2019-04-24 NOTE — Telephone Encounter (Signed)
Received referral for low dose lung cancer screening CT scan. Message left at phone number listed in EMR for patient to call me back to facilitate scheduling scan.  

## 2019-05-09 ENCOUNTER — Telehealth: Payer: Self-pay | Admitting: *Deleted

## 2019-05-09 NOTE — Telephone Encounter (Signed)
Contacted regarding lung screening referral. Patient requests I call back in the future.

## 2019-06-08 ENCOUNTER — Telehealth: Payer: Self-pay | Admitting: *Deleted

## 2019-06-08 NOTE — Telephone Encounter (Signed)
After multiple attempts contacted patient in attempt to schedule lung screening scan. Patient requests to call me back in the future due to responsibilities with her husband's care at this time. Patient is given my contact number.

## 2019-06-14 IMAGING — MR MR KNEE*L* W/O CM
7 series · 40 of 40 positions shown · non-contrast
Comparison: None.

CLINICAL DATA: Left knee pain worsening in the past month.

EXAM:
MRI OF THE LEFT KNEE WITHOUT CONTRAST
TECHNIQUE: Multiplanar, multisequence MR imaging of the knee was performed. No
intravenous contrast was administered.

[Series 8: T2 fat-sat · axial · left · 4.0mm · 0.50mm/px · z∈[-69,+55]mm · 7 of 26 slices shown (1 of 3)]
[im 1/26]
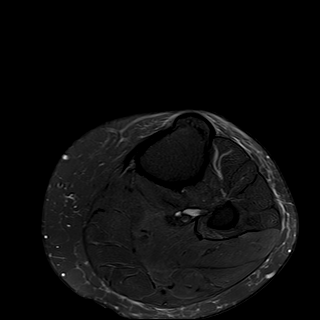
[im 5/26]
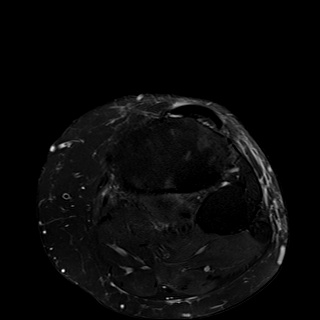
[im 9/26]
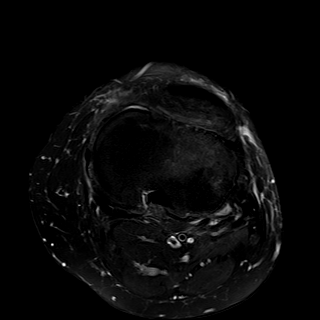
[im 13/26]
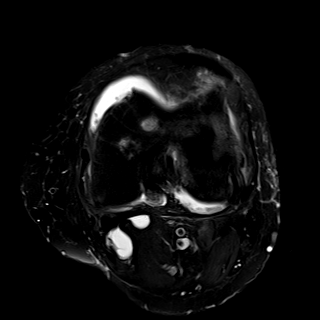
[im 17/26]
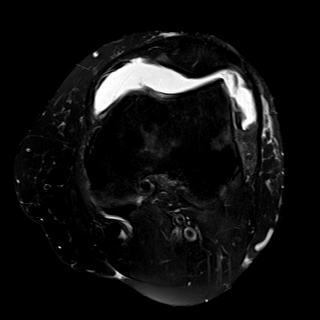
[im 21/26]
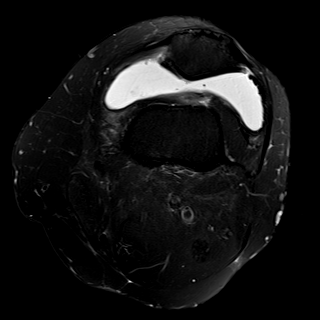
[im 26/26]
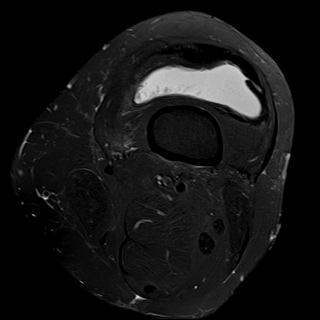

[Series 9: T1 · coronal · left · 4.0mm · 0.59mm/px · 6 of 30 slices shown]
[im 1/30]
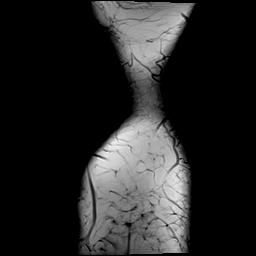
[im 6/30]
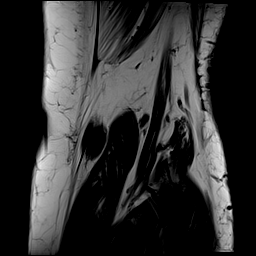
[im 12/30]
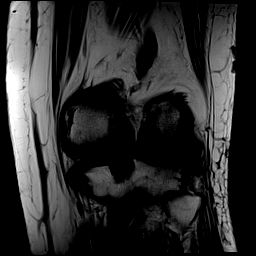
[im 18/30]
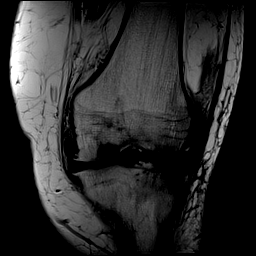
[im 24/30]
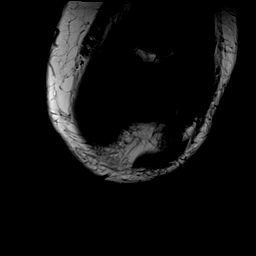
[im 30/30]
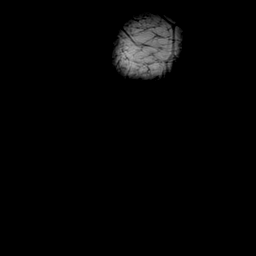

[Series 10: T2 fat-sat · coronal · left · 4.0mm · 0.59mm/px · 6 of 30 slices shown (2 of 3)]
[im 1/30]
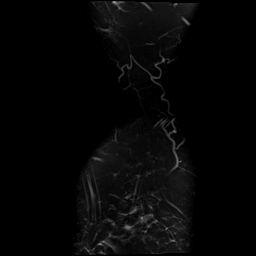
[im 6/30]
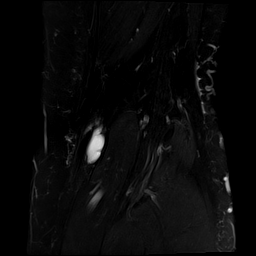
[im 12/30]
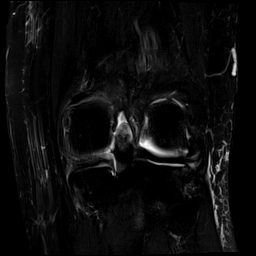
[im 18/30]
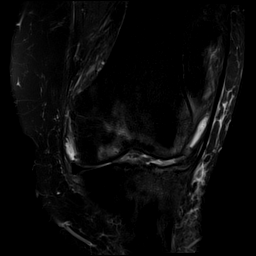
[im 24/30]
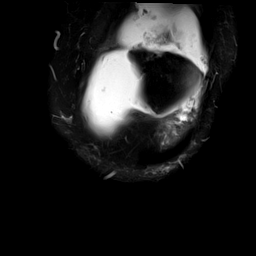
[im 30/30]
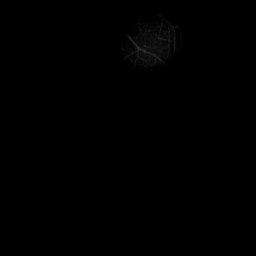

[Series 11: PD fat-sat · coronal · left · 4.0mm · 0.59mm/px · 6 of 30 slices shown (1 of 2)]
[im 1/30]
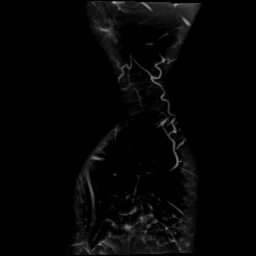
[im 6/30]
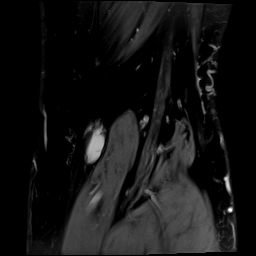
[im 12/30]
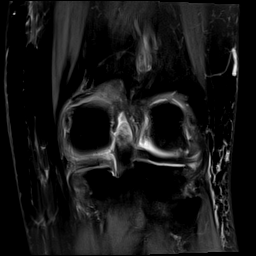
[im 18/30]
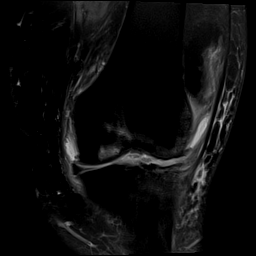
[im 24/30]
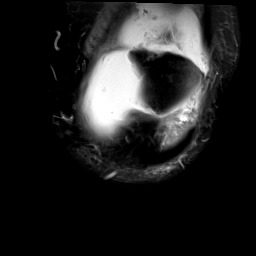
[im 30/30]
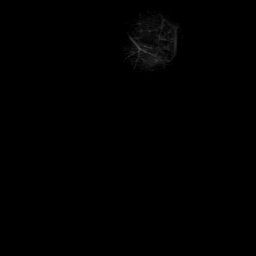

[Series 12: PD fat-sat · sagittal · left · 3.0mm · 0.59mm/px · 6 of 29 slices shown (2 of 2)]
[im 1/29]
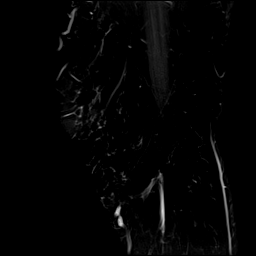
[im 6/29]
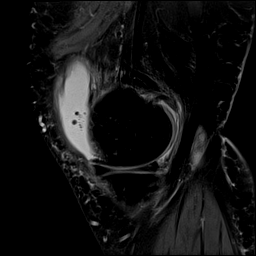
[im 12/29]
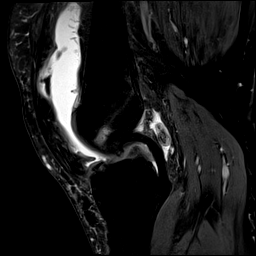
[im 17/29]
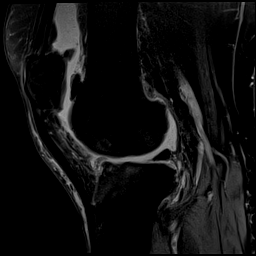
[im 23/29]
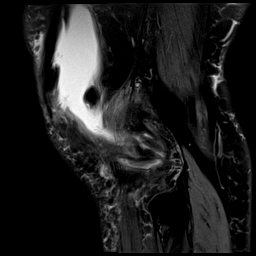
[im 29/29]
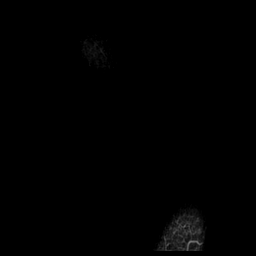

[Series 13: T2 fat-sat · sagittal · left · 3.0mm · 0.59mm/px · 7 of 34 slices shown (3 of 3)]
[im 1/34]
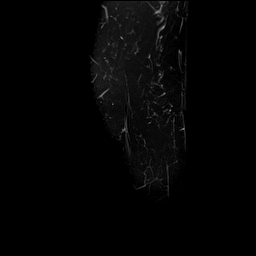
[im 6/34]
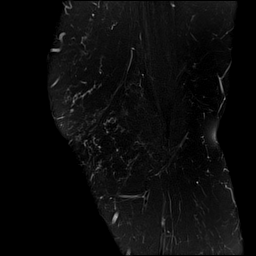
[im 12/34]
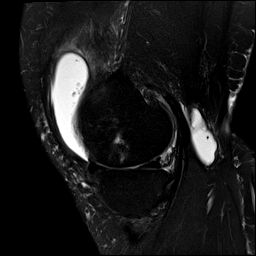
[im 17/34]
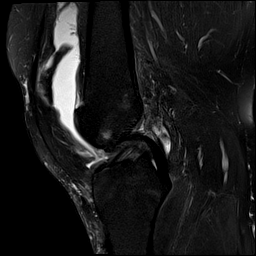
[im 23/34]
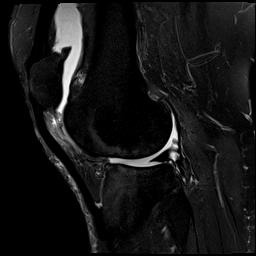
[im 28/34]
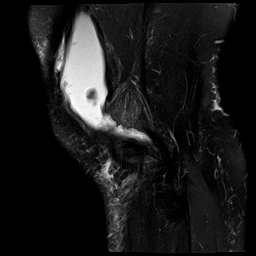
[im 34/34]
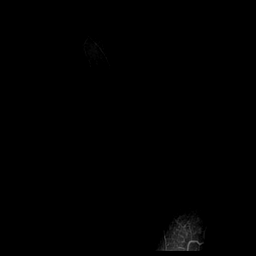

[Series 14: PD · oblique · left · 2.0mm · 0.47mm/px · 2 of 10 slices shown]
[im 1/10]
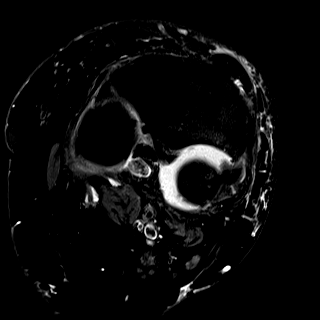
[im 10/10]
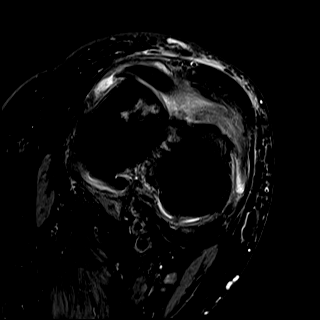

[40 of 40 positions shown; findings below may reference images not displayed]

FINDINGS: MENISCI

Medial meniscus:  Degenerative changes but no discrete tear.

Lateral meniscus: Severely degenerated and torn in the midbody
region and anterior horn. The posterior horn is intact.

LIGAMENTS

Cruciates:  Intact

Collaterals:  Intact

CARTILAGE

Patellofemoral: Advanced degenerative chondrosis with areas of
full-thickness cartilage loss and osteophytic spurring.

Medial: Moderate to advanced degenerative chondrosis with areas of
full or near full-thickness cartilage loss, joint space narrowing
and spurring.

Lateral: Advanced degenerative chondrosis with full-thickness
cartilage loss and osteophytic spurring.

Joint: Large joint effusion and moderate synovitis. Suspect early
changes of lipoma arborescens. There also loose bodies in the joint
would suggest synovial osteochondromatosis.

Popliteal Fossa:  Small Baker's cyst.

Extensor Mechanism: The patella retinacular structures are intact
and the quadriceps and patellar tendons are intact.

Bones: Patchy areas of reactive marrow edema along with subchondral
cystic changes and possible erosions.

Other: Normal knee musculature.
IMPRESSION: 1. Severely degenerated and torn lateral meniscus in the midbody
region and anterior horn.
2. Intact ligamentous structures and no acute bony findings.
3. Significant tricompartmental degenerative changes.
4. Large joint effusion and moderate synovitis. Suspect early
changes of lipoma arborescens and synovial osteochondromatosis.

## 2019-06-23 DIAGNOSIS — E119 Type 2 diabetes mellitus without complications: Secondary | ICD-10-CM | POA: Diagnosis not present

## 2019-08-05 DIAGNOSIS — Z23 Encounter for immunization: Secondary | ICD-10-CM | POA: Diagnosis not present

## 2019-08-10 DIAGNOSIS — I10 Essential (primary) hypertension: Secondary | ICD-10-CM | POA: Diagnosis not present

## 2019-08-10 DIAGNOSIS — R55 Syncope and collapse: Secondary | ICD-10-CM | POA: Diagnosis not present

## 2019-08-10 DIAGNOSIS — Z87891 Personal history of nicotine dependence: Secondary | ICD-10-CM | POA: Diagnosis not present

## 2019-08-10 DIAGNOSIS — E1142 Type 2 diabetes mellitus with diabetic polyneuropathy: Secondary | ICD-10-CM | POA: Diagnosis not present

## 2019-08-10 DIAGNOSIS — I25118 Atherosclerotic heart disease of native coronary artery with other forms of angina pectoris: Secondary | ICD-10-CM | POA: Diagnosis not present

## 2019-08-10 DIAGNOSIS — R011 Cardiac murmur, unspecified: Secondary | ICD-10-CM | POA: Diagnosis not present

## 2019-08-10 DIAGNOSIS — E7800 Pure hypercholesterolemia, unspecified: Secondary | ICD-10-CM | POA: Diagnosis not present

## 2019-08-10 DIAGNOSIS — F17200 Nicotine dependence, unspecified, uncomplicated: Secondary | ICD-10-CM | POA: Diagnosis not present

## 2019-08-10 DIAGNOSIS — I2510 Atherosclerotic heart disease of native coronary artery without angina pectoris: Secondary | ICD-10-CM | POA: Diagnosis not present

## 2019-08-10 DIAGNOSIS — G629 Polyneuropathy, unspecified: Secondary | ICD-10-CM | POA: Diagnosis not present

## 2019-08-10 DIAGNOSIS — N183 Chronic kidney disease, stage 3 unspecified: Secondary | ICD-10-CM | POA: Diagnosis not present

## 2019-08-10 DIAGNOSIS — R0602 Shortness of breath: Secondary | ICD-10-CM | POA: Diagnosis not present

## 2019-09-02 DIAGNOSIS — Z23 Encounter for immunization: Secondary | ICD-10-CM | POA: Diagnosis not present

## 2019-10-11 DIAGNOSIS — E7800 Pure hypercholesterolemia, unspecified: Secondary | ICD-10-CM | POA: Diagnosis not present

## 2019-10-11 DIAGNOSIS — Z1159 Encounter for screening for other viral diseases: Secondary | ICD-10-CM | POA: Diagnosis not present

## 2019-10-11 DIAGNOSIS — I10 Essential (primary) hypertension: Secondary | ICD-10-CM | POA: Diagnosis not present

## 2019-10-11 DIAGNOSIS — G629 Polyneuropathy, unspecified: Secondary | ICD-10-CM | POA: Diagnosis not present

## 2019-10-11 DIAGNOSIS — I2510 Atherosclerotic heart disease of native coronary artery without angina pectoris: Secondary | ICD-10-CM | POA: Diagnosis not present

## 2019-10-11 DIAGNOSIS — E1142 Type 2 diabetes mellitus with diabetic polyneuropathy: Secondary | ICD-10-CM | POA: Diagnosis not present

## 2019-10-18 DIAGNOSIS — I2510 Atherosclerotic heart disease of native coronary artery without angina pectoris: Secondary | ICD-10-CM | POA: Diagnosis not present

## 2019-10-18 DIAGNOSIS — I10 Essential (primary) hypertension: Secondary | ICD-10-CM | POA: Diagnosis not present

## 2019-10-18 DIAGNOSIS — E1142 Type 2 diabetes mellitus with diabetic polyneuropathy: Secondary | ICD-10-CM | POA: Diagnosis not present

## 2019-10-18 DIAGNOSIS — N183 Chronic kidney disease, stage 3 unspecified: Secondary | ICD-10-CM | POA: Diagnosis not present

## 2019-10-18 DIAGNOSIS — B351 Tinea unguium: Secondary | ICD-10-CM | POA: Diagnosis not present

## 2019-10-18 DIAGNOSIS — G8929 Other chronic pain: Secondary | ICD-10-CM | POA: Diagnosis not present

## 2019-10-18 DIAGNOSIS — E7800 Pure hypercholesterolemia, unspecified: Secondary | ICD-10-CM | POA: Diagnosis not present

## 2019-10-18 DIAGNOSIS — E1340 Other specified diabetes mellitus with diabetic neuropathy, unspecified: Secondary | ICD-10-CM | POA: Diagnosis not present

## 2019-10-18 DIAGNOSIS — R011 Cardiac murmur, unspecified: Secondary | ICD-10-CM | POA: Diagnosis not present

## 2019-10-18 DIAGNOSIS — M545 Low back pain: Secondary | ICD-10-CM | POA: Diagnosis not present

## 2019-10-18 DIAGNOSIS — E1169 Type 2 diabetes mellitus with other specified complication: Secondary | ICD-10-CM | POA: Diagnosis not present

## 2019-10-22 IMAGING — CR LUMBAR SPINE - 2-3 VIEW
3 series · 3 of 3 positions shown · non-contrast
Comparison: None.

CLINICAL DATA: Pain after trauma

EXAM:
LUMBAR SPINE - 2-3 VIEW

[l-spine ap]
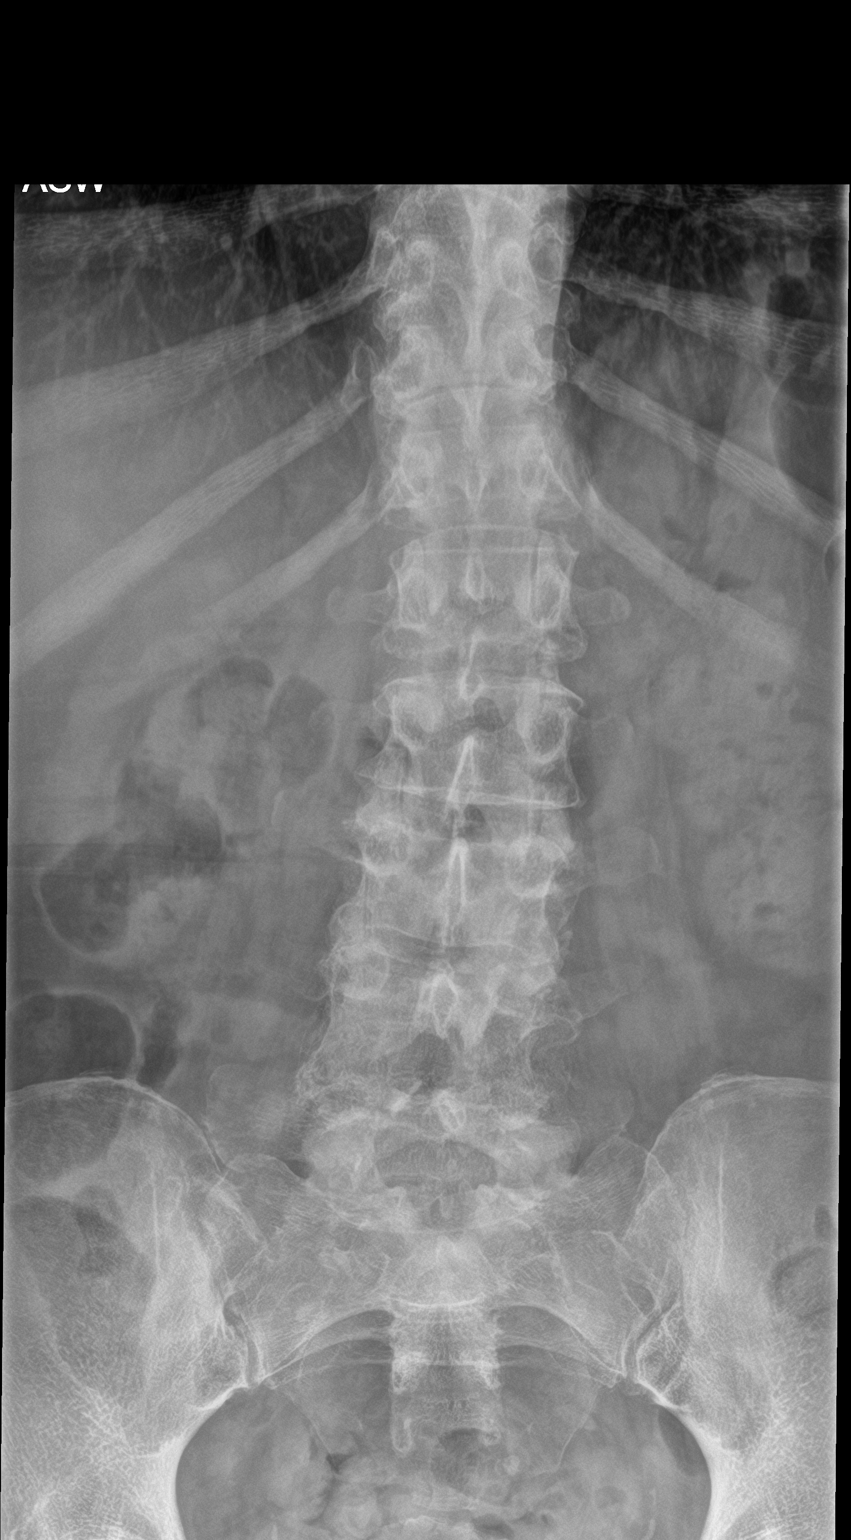

[l-spine lat]
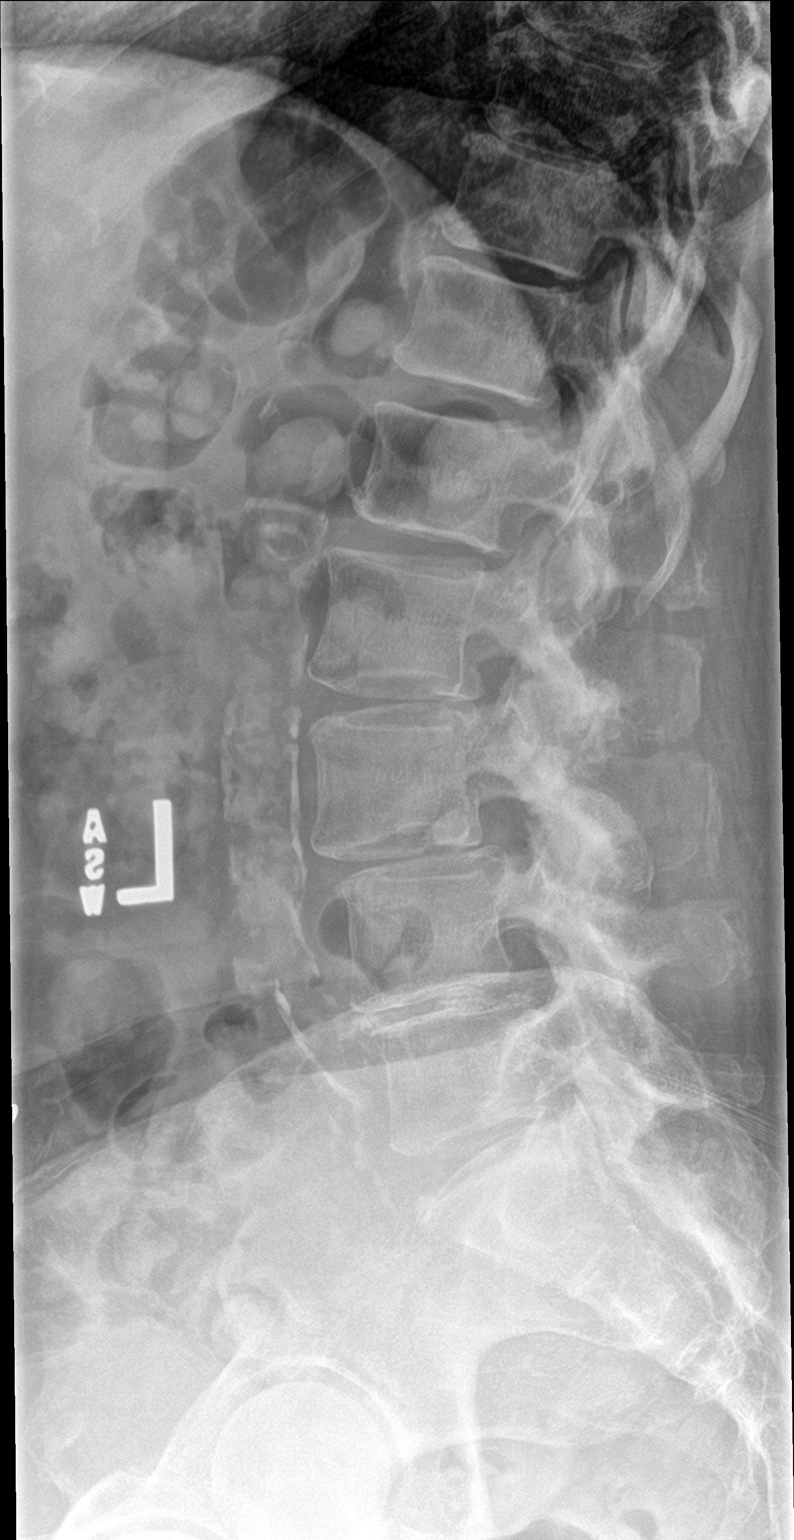

[l-spine spot]
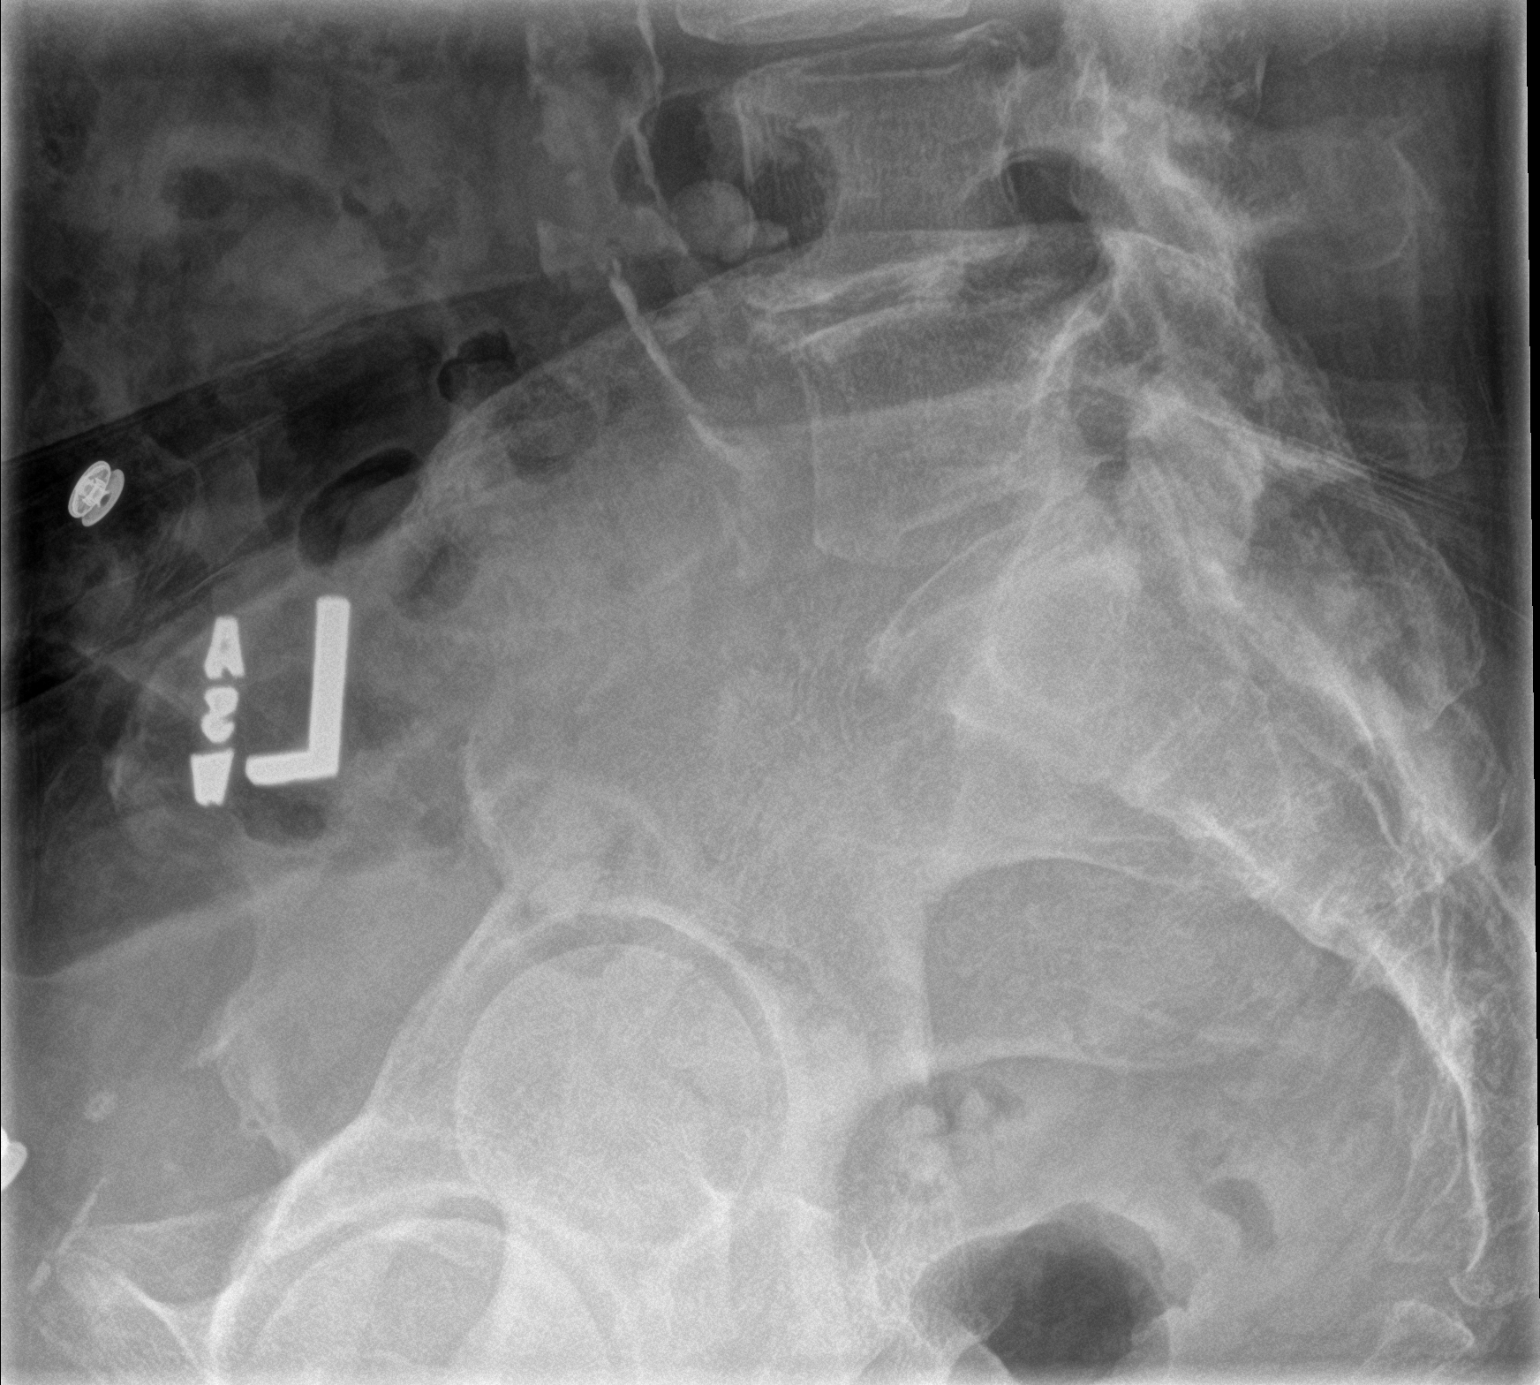

[3 of 3 positions shown; findings below may reference images not displayed]

FINDINGS: Grade 1 anterolisthesis of L3 versus L4 and L4 versus L5. No other
malalignment. No acute fractures. Lumbar facet degenerative changes
are noted. Atherosclerotic changes are seen in the abdominal aorta
and iliac vessels.
IMPRESSION: 1. Mild grade 1 anterolisthesis of L3 versus L4 and L4 versus L5. No
traumatic malalignment or fracture noted.
2. Atherosclerotic changes in the abdominal aorta and iliac vessels.
3. Lumbar facet degenerative changes.

## 2019-12-06 DIAGNOSIS — G8929 Other chronic pain: Secondary | ICD-10-CM | POA: Diagnosis not present

## 2019-12-06 DIAGNOSIS — E519 Thiamine deficiency, unspecified: Secondary | ICD-10-CM | POA: Diagnosis not present

## 2019-12-06 DIAGNOSIS — R202 Paresthesia of skin: Secondary | ICD-10-CM | POA: Diagnosis not present

## 2019-12-06 DIAGNOSIS — E559 Vitamin D deficiency, unspecified: Secondary | ICD-10-CM | POA: Diagnosis not present

## 2019-12-06 DIAGNOSIS — R200 Anesthesia of skin: Secondary | ICD-10-CM | POA: Diagnosis not present

## 2019-12-06 DIAGNOSIS — M545 Low back pain: Secondary | ICD-10-CM | POA: Diagnosis not present

## 2019-12-06 DIAGNOSIS — E538 Deficiency of other specified B group vitamins: Secondary | ICD-10-CM | POA: Diagnosis not present

## 2019-12-06 DIAGNOSIS — E531 Pyridoxine deficiency: Secondary | ICD-10-CM | POA: Diagnosis not present

## 2019-12-22 DIAGNOSIS — E1142 Type 2 diabetes mellitus with diabetic polyneuropathy: Secondary | ICD-10-CM | POA: Diagnosis not present

## 2019-12-22 DIAGNOSIS — I10 Essential (primary) hypertension: Secondary | ICD-10-CM | POA: Diagnosis not present

## 2019-12-22 DIAGNOSIS — T783XXA Angioneurotic edema, initial encounter: Secondary | ICD-10-CM | POA: Diagnosis not present

## 2019-12-22 DIAGNOSIS — R21 Rash and other nonspecific skin eruption: Secondary | ICD-10-CM | POA: Diagnosis not present

## 2019-12-27 DIAGNOSIS — I10 Essential (primary) hypertension: Secondary | ICD-10-CM | POA: Diagnosis not present

## 2019-12-27 DIAGNOSIS — T783XXA Angioneurotic edema, initial encounter: Secondary | ICD-10-CM | POA: Diagnosis not present

## 2019-12-28 ENCOUNTER — Other Ambulatory Visit: Payer: Self-pay | Admitting: Internal Medicine

## 2019-12-28 DIAGNOSIS — Z1231 Encounter for screening mammogram for malignant neoplasm of breast: Secondary | ICD-10-CM

## 2020-01-12 DIAGNOSIS — Z6830 Body mass index (BMI) 30.0-30.9, adult: Secondary | ICD-10-CM | POA: Diagnosis not present

## 2020-01-12 DIAGNOSIS — E6609 Other obesity due to excess calories: Secondary | ICD-10-CM | POA: Diagnosis not present

## 2020-01-12 DIAGNOSIS — F17200 Nicotine dependence, unspecified, uncomplicated: Secondary | ICD-10-CM | POA: Diagnosis not present

## 2020-01-12 DIAGNOSIS — R0989 Other specified symptoms and signs involving the circulatory and respiratory systems: Secondary | ICD-10-CM | POA: Diagnosis not present

## 2020-01-12 DIAGNOSIS — N183 Chronic kidney disease, stage 3 unspecified: Secondary | ICD-10-CM | POA: Diagnosis not present

## 2020-01-12 DIAGNOSIS — I2510 Atherosclerotic heart disease of native coronary artery without angina pectoris: Secondary | ICD-10-CM | POA: Diagnosis not present

## 2020-01-12 DIAGNOSIS — T783XXA Angioneurotic edema, initial encounter: Secondary | ICD-10-CM | POA: Diagnosis not present

## 2020-01-12 DIAGNOSIS — I10 Essential (primary) hypertension: Secondary | ICD-10-CM | POA: Diagnosis not present

## 2020-01-12 DIAGNOSIS — R011 Cardiac murmur, unspecified: Secondary | ICD-10-CM | POA: Diagnosis not present

## 2020-01-12 DIAGNOSIS — E1142 Type 2 diabetes mellitus with diabetic polyneuropathy: Secondary | ICD-10-CM | POA: Diagnosis not present

## 2020-01-12 DIAGNOSIS — R0602 Shortness of breath: Secondary | ICD-10-CM | POA: Diagnosis not present

## 2020-01-12 DIAGNOSIS — E7800 Pure hypercholesterolemia, unspecified: Secondary | ICD-10-CM | POA: Diagnosis not present

## 2020-02-02 DIAGNOSIS — N183 Chronic kidney disease, stage 3 unspecified: Secondary | ICD-10-CM | POA: Diagnosis not present

## 2020-02-02 DIAGNOSIS — R700 Elevated erythrocyte sedimentation rate: Secondary | ICD-10-CM | POA: Diagnosis not present

## 2020-02-02 DIAGNOSIS — H04123 Dry eye syndrome of bilateral lacrimal glands: Secondary | ICD-10-CM | POA: Diagnosis not present

## 2020-02-02 DIAGNOSIS — R252 Cramp and spasm: Secondary | ICD-10-CM | POA: Diagnosis not present

## 2020-02-05 ENCOUNTER — Ambulatory Visit
Admission: RE | Admit: 2020-02-05 | Discharge: 2020-02-05 | Disposition: A | Discharge status: Home / Self Care | Payer: PPO | Source: Ambulatory Visit | Attending: Internal Medicine | Admitting: Internal Medicine

## 2020-02-05 ENCOUNTER — Other Ambulatory Visit: Payer: Self-pay

## 2020-02-05 DIAGNOSIS — Z1231 Encounter for screening mammogram for malignant neoplasm of breast: Secondary | ICD-10-CM | POA: Insufficient documentation

## 2020-02-16 DIAGNOSIS — R0789 Other chest pain: Secondary | ICD-10-CM | POA: Diagnosis not present

## 2020-02-16 DIAGNOSIS — I771 Stricture of artery: Secondary | ICD-10-CM | POA: Diagnosis not present

## 2020-02-16 DIAGNOSIS — R55 Syncope and collapse: Secondary | ICD-10-CM | POA: Diagnosis not present

## 2020-02-16 DIAGNOSIS — I6523 Occlusion and stenosis of bilateral carotid arteries: Secondary | ICD-10-CM | POA: Diagnosis not present

## 2020-02-16 DIAGNOSIS — R011 Cardiac murmur, unspecified: Secondary | ICD-10-CM | POA: Diagnosis not present

## 2020-02-23 DIAGNOSIS — R202 Paresthesia of skin: Secondary | ICD-10-CM | POA: Diagnosis not present

## 2020-02-23 DIAGNOSIS — R200 Anesthesia of skin: Secondary | ICD-10-CM | POA: Diagnosis not present

## 2020-02-26 DIAGNOSIS — I10 Essential (primary) hypertension: Secondary | ICD-10-CM | POA: Diagnosis not present

## 2020-02-26 DIAGNOSIS — R0602 Shortness of breath: Secondary | ICD-10-CM | POA: Diagnosis not present

## 2020-02-26 DIAGNOSIS — R55 Syncope and collapse: Secondary | ICD-10-CM | POA: Diagnosis not present

## 2020-02-26 DIAGNOSIS — E6609 Other obesity due to excess calories: Secondary | ICD-10-CM | POA: Diagnosis not present

## 2020-02-26 DIAGNOSIS — F17200 Nicotine dependence, unspecified, uncomplicated: Secondary | ICD-10-CM | POA: Diagnosis not present

## 2020-02-26 DIAGNOSIS — E1142 Type 2 diabetes mellitus with diabetic polyneuropathy: Secondary | ICD-10-CM | POA: Diagnosis not present

## 2020-02-26 DIAGNOSIS — I2510 Atherosclerotic heart disease of native coronary artery without angina pectoris: Secondary | ICD-10-CM | POA: Diagnosis not present

## 2020-02-26 DIAGNOSIS — E7800 Pure hypercholesterolemia, unspecified: Secondary | ICD-10-CM | POA: Diagnosis not present

## 2020-02-26 DIAGNOSIS — G629 Polyneuropathy, unspecified: Secondary | ICD-10-CM | POA: Diagnosis not present

## 2020-02-26 DIAGNOSIS — N183 Chronic kidney disease, stage 3 unspecified: Secondary | ICD-10-CM | POA: Diagnosis not present

## 2020-02-26 DIAGNOSIS — R011 Cardiac murmur, unspecified: Secondary | ICD-10-CM | POA: Diagnosis not present

## 2020-02-26 DIAGNOSIS — T783XXA Angioneurotic edema, initial encounter: Secondary | ICD-10-CM | POA: Diagnosis not present

## 2020-03-07 DIAGNOSIS — G8929 Other chronic pain: Secondary | ICD-10-CM | POA: Diagnosis not present

## 2020-03-07 DIAGNOSIS — G629 Polyneuropathy, unspecified: Secondary | ICD-10-CM | POA: Diagnosis not present

## 2020-03-07 DIAGNOSIS — M545 Low back pain: Secondary | ICD-10-CM | POA: Diagnosis not present

## 2020-04-03 DIAGNOSIS — R700 Elevated erythrocyte sedimentation rate: Secondary | ICD-10-CM | POA: Diagnosis not present

## 2020-04-03 DIAGNOSIS — G629 Polyneuropathy, unspecified: Secondary | ICD-10-CM | POA: Diagnosis not present

## 2020-04-03 DIAGNOSIS — N183 Chronic kidney disease, stage 3 unspecified: Secondary | ICD-10-CM | POA: Diagnosis not present

## 2020-06-25 DIAGNOSIS — E1142 Type 2 diabetes mellitus with diabetic polyneuropathy: Secondary | ICD-10-CM | POA: Diagnosis not present

## 2020-06-25 DIAGNOSIS — I2510 Atherosclerotic heart disease of native coronary artery without angina pectoris: Secondary | ICD-10-CM | POA: Diagnosis not present

## 2020-06-25 DIAGNOSIS — N183 Chronic kidney disease, stage 3 unspecified: Secondary | ICD-10-CM | POA: Diagnosis not present

## 2020-06-25 DIAGNOSIS — E7800 Pure hypercholesterolemia, unspecified: Secondary | ICD-10-CM | POA: Diagnosis not present

## 2020-07-02 DIAGNOSIS — I10 Essential (primary) hypertension: Secondary | ICD-10-CM | POA: Diagnosis not present

## 2020-07-02 DIAGNOSIS — I2510 Atherosclerotic heart disease of native coronary artery without angina pectoris: Secondary | ICD-10-CM | POA: Diagnosis not present

## 2020-07-02 DIAGNOSIS — Z0000 Encounter for general adult medical examination without abnormal findings: Secondary | ICD-10-CM | POA: Diagnosis not present

## 2020-07-02 DIAGNOSIS — G63 Polyneuropathy in diseases classified elsewhere: Secondary | ICD-10-CM | POA: Diagnosis not present

## 2020-07-02 DIAGNOSIS — E1169 Type 2 diabetes mellitus with other specified complication: Secondary | ICD-10-CM | POA: Diagnosis not present

## 2020-07-02 DIAGNOSIS — E1142 Type 2 diabetes mellitus with diabetic polyneuropathy: Secondary | ICD-10-CM | POA: Diagnosis not present

## 2020-07-02 DIAGNOSIS — E7800 Pure hypercholesterolemia, unspecified: Secondary | ICD-10-CM | POA: Diagnosis not present

## 2020-07-02 DIAGNOSIS — G8929 Other chronic pain: Secondary | ICD-10-CM | POA: Diagnosis not present

## 2020-07-02 DIAGNOSIS — M545 Low back pain, unspecified: Secondary | ICD-10-CM | POA: Diagnosis not present

## 2020-07-02 DIAGNOSIS — B351 Tinea unguium: Secondary | ICD-10-CM | POA: Diagnosis not present

## 2020-07-13 DIAGNOSIS — E119 Type 2 diabetes mellitus without complications: Secondary | ICD-10-CM | POA: Diagnosis not present

## 2020-07-13 DIAGNOSIS — F17210 Nicotine dependence, cigarettes, uncomplicated: Secondary | ICD-10-CM | POA: Diagnosis not present

## 2020-07-13 DIAGNOSIS — I2510 Atherosclerotic heart disease of native coronary artery without angina pectoris: Secondary | ICD-10-CM | POA: Diagnosis not present

## 2020-07-13 DIAGNOSIS — R059 Cough, unspecified: Secondary | ICD-10-CM | POA: Diagnosis not present

## 2020-07-13 DIAGNOSIS — I110 Hypertensive heart disease with heart failure: Secondary | ICD-10-CM | POA: Diagnosis not present

## 2020-07-13 DIAGNOSIS — Z885 Allergy status to narcotic agent status: Secondary | ICD-10-CM | POA: Diagnosis not present

## 2020-07-13 DIAGNOSIS — Z951 Presence of aortocoronary bypass graft: Secondary | ICD-10-CM | POA: Diagnosis not present

## 2020-07-13 DIAGNOSIS — I509 Heart failure, unspecified: Secondary | ICD-10-CM | POA: Diagnosis not present

## 2020-07-13 DIAGNOSIS — E7800 Pure hypercholesterolemia, unspecified: Secondary | ICD-10-CM | POA: Diagnosis not present

## 2020-07-13 DIAGNOSIS — U071 COVID-19: Secondary | ICD-10-CM | POA: Diagnosis not present

## 2020-07-16 DIAGNOSIS — I2510 Atherosclerotic heart disease of native coronary artery without angina pectoris: Secondary | ICD-10-CM | POA: Diagnosis not present

## 2020-07-16 DIAGNOSIS — U071 COVID-19: Secondary | ICD-10-CM | POA: Diagnosis not present

## 2020-07-16 DIAGNOSIS — I10 Essential (primary) hypertension: Secondary | ICD-10-CM | POA: Diagnosis not present

## 2020-07-16 DIAGNOSIS — J208 Acute bronchitis due to other specified organisms: Secondary | ICD-10-CM | POA: Diagnosis not present

## 2020-07-16 DIAGNOSIS — E1142 Type 2 diabetes mellitus with diabetic polyneuropathy: Secondary | ICD-10-CM | POA: Diagnosis not present

## 2020-07-23 DIAGNOSIS — I10 Essential (primary) hypertension: Secondary | ICD-10-CM | POA: Diagnosis not present

## 2020-08-08 DIAGNOSIS — G8929 Other chronic pain: Secondary | ICD-10-CM | POA: Diagnosis not present

## 2020-08-08 DIAGNOSIS — M545 Low back pain, unspecified: Secondary | ICD-10-CM | POA: Diagnosis not present

## 2020-08-08 DIAGNOSIS — G629 Polyneuropathy, unspecified: Secondary | ICD-10-CM | POA: Diagnosis not present

## 2020-09-02 DIAGNOSIS — E1142 Type 2 diabetes mellitus with diabetic polyneuropathy: Secondary | ICD-10-CM | POA: Diagnosis not present

## 2020-09-02 DIAGNOSIS — I2510 Atherosclerotic heart disease of native coronary artery without angina pectoris: Secondary | ICD-10-CM | POA: Diagnosis not present

## 2020-09-02 DIAGNOSIS — R011 Cardiac murmur, unspecified: Secondary | ICD-10-CM | POA: Diagnosis not present

## 2020-09-02 DIAGNOSIS — Z6830 Body mass index (BMI) 30.0-30.9, adult: Secondary | ICD-10-CM | POA: Diagnosis not present

## 2020-09-02 DIAGNOSIS — E7800 Pure hypercholesterolemia, unspecified: Secondary | ICD-10-CM | POA: Diagnosis not present

## 2020-09-02 DIAGNOSIS — I10 Essential (primary) hypertension: Secondary | ICD-10-CM | POA: Diagnosis not present

## 2020-09-02 DIAGNOSIS — R55 Syncope and collapse: Secondary | ICD-10-CM | POA: Diagnosis not present

## 2020-09-02 DIAGNOSIS — G629 Polyneuropathy, unspecified: Secondary | ICD-10-CM | POA: Diagnosis not present

## 2020-09-02 DIAGNOSIS — N183 Chronic kidney disease, stage 3 unspecified: Secondary | ICD-10-CM | POA: Diagnosis not present

## 2020-09-02 DIAGNOSIS — E6609 Other obesity due to excess calories: Secondary | ICD-10-CM | POA: Diagnosis not present

## 2020-09-02 DIAGNOSIS — R0602 Shortness of breath: Secondary | ICD-10-CM | POA: Diagnosis not present

## 2020-10-02 DIAGNOSIS — S76311A Strain of muscle, fascia and tendon of the posterior muscle group at thigh level, right thigh, initial encounter: Secondary | ICD-10-CM | POA: Diagnosis not present

## 2020-10-02 DIAGNOSIS — R700 Elevated erythrocyte sedimentation rate: Secondary | ICD-10-CM | POA: Diagnosis not present

## 2020-10-21 DIAGNOSIS — M25551 Pain in right hip: Secondary | ICD-10-CM | POA: Diagnosis not present

## 2020-11-08 DIAGNOSIS — M7061 Trochanteric bursitis, right hip: Secondary | ICD-10-CM | POA: Diagnosis not present

## 2020-11-08 DIAGNOSIS — E1142 Type 2 diabetes mellitus with diabetic polyneuropathy: Secondary | ICD-10-CM | POA: Diagnosis not present

## 2020-11-28 DIAGNOSIS — M79651 Pain in right thigh: Secondary | ICD-10-CM | POA: Diagnosis not present

## 2020-12-18 DIAGNOSIS — M545 Low back pain, unspecified: Secondary | ICD-10-CM | POA: Diagnosis not present

## 2020-12-18 DIAGNOSIS — G629 Polyneuropathy, unspecified: Secondary | ICD-10-CM | POA: Diagnosis not present

## 2020-12-18 DIAGNOSIS — M25551 Pain in right hip: Secondary | ICD-10-CM | POA: Diagnosis not present

## 2020-12-18 DIAGNOSIS — G8929 Other chronic pain: Secondary | ICD-10-CM | POA: Diagnosis not present

## 2020-12-23 DIAGNOSIS — E7800 Pure hypercholesterolemia, unspecified: Secondary | ICD-10-CM | POA: Diagnosis not present

## 2020-12-23 DIAGNOSIS — I10 Essential (primary) hypertension: Secondary | ICD-10-CM | POA: Diagnosis not present

## 2020-12-23 DIAGNOSIS — E1142 Type 2 diabetes mellitus with diabetic polyneuropathy: Secondary | ICD-10-CM | POA: Diagnosis not present

## 2020-12-23 DIAGNOSIS — G63 Polyneuropathy in diseases classified elsewhere: Secondary | ICD-10-CM | POA: Diagnosis not present

## 2020-12-24 DIAGNOSIS — M1611 Unilateral primary osteoarthritis, right hip: Secondary | ICD-10-CM | POA: Diagnosis not present

## 2020-12-24 DIAGNOSIS — M25551 Pain in right hip: Secondary | ICD-10-CM | POA: Diagnosis not present

## 2020-12-24 DIAGNOSIS — M76891 Other specified enthesopathies of right lower limb, excluding foot: Secondary | ICD-10-CM | POA: Diagnosis not present

## 2020-12-24 DIAGNOSIS — M7061 Trochanteric bursitis, right hip: Secondary | ICD-10-CM | POA: Diagnosis not present

## 2020-12-24 DIAGNOSIS — E119 Type 2 diabetes mellitus without complications: Secondary | ICD-10-CM | POA: Diagnosis not present

## 2020-12-24 DIAGNOSIS — M47816 Spondylosis without myelopathy or radiculopathy, lumbar region: Secondary | ICD-10-CM | POA: Diagnosis not present

## 2020-12-24 DIAGNOSIS — G8929 Other chronic pain: Secondary | ICD-10-CM | POA: Diagnosis not present

## 2020-12-30 DIAGNOSIS — E7800 Pure hypercholesterolemia, unspecified: Secondary | ICD-10-CM | POA: Diagnosis not present

## 2020-12-30 DIAGNOSIS — Z780 Asymptomatic menopausal state: Secondary | ICD-10-CM | POA: Diagnosis not present

## 2020-12-30 DIAGNOSIS — B351 Tinea unguium: Secondary | ICD-10-CM | POA: Diagnosis not present

## 2020-12-30 DIAGNOSIS — M1712 Unilateral primary osteoarthritis, left knee: Secondary | ICD-10-CM | POA: Diagnosis not present

## 2020-12-30 DIAGNOSIS — E1142 Type 2 diabetes mellitus with diabetic polyneuropathy: Secondary | ICD-10-CM | POA: Diagnosis not present

## 2020-12-30 DIAGNOSIS — M545 Low back pain, unspecified: Secondary | ICD-10-CM | POA: Diagnosis not present

## 2020-12-30 DIAGNOSIS — G63 Polyneuropathy in diseases classified elsewhere: Secondary | ICD-10-CM | POA: Diagnosis not present

## 2020-12-30 DIAGNOSIS — G8929 Other chronic pain: Secondary | ICD-10-CM | POA: Diagnosis not present

## 2020-12-30 DIAGNOSIS — I10 Essential (primary) hypertension: Secondary | ICD-10-CM | POA: Diagnosis not present

## 2020-12-30 DIAGNOSIS — I2510 Atherosclerotic heart disease of native coronary artery without angina pectoris: Secondary | ICD-10-CM | POA: Diagnosis not present

## 2020-12-30 DIAGNOSIS — E876 Hypokalemia: Secondary | ICD-10-CM | POA: Diagnosis not present

## 2020-12-30 DIAGNOSIS — E1169 Type 2 diabetes mellitus with other specified complication: Secondary | ICD-10-CM | POA: Diagnosis not present

## 2020-12-31 ENCOUNTER — Ambulatory Visit: Payer: PPO | Admitting: Physical Therapy

## 2021-01-02 DIAGNOSIS — Z780 Asymptomatic menopausal state: Secondary | ICD-10-CM | POA: Diagnosis not present

## 2021-01-03 ENCOUNTER — Other Ambulatory Visit: Payer: Self-pay | Admitting: Internal Medicine

## 2021-01-03 DIAGNOSIS — Z1231 Encounter for screening mammogram for malignant neoplasm of breast: Secondary | ICD-10-CM

## 2021-01-15 DIAGNOSIS — G8929 Other chronic pain: Secondary | ICD-10-CM | POA: Diagnosis not present

## 2021-01-15 DIAGNOSIS — M1611 Unilateral primary osteoarthritis, right hip: Secondary | ICD-10-CM | POA: Diagnosis not present

## 2021-01-15 DIAGNOSIS — M25551 Pain in right hip: Secondary | ICD-10-CM | POA: Diagnosis not present

## 2021-01-15 DIAGNOSIS — M7061 Trochanteric bursitis, right hip: Secondary | ICD-10-CM | POA: Diagnosis not present

## 2021-01-15 DIAGNOSIS — M76891 Other specified enthesopathies of right lower limb, excluding foot: Secondary | ICD-10-CM | POA: Diagnosis not present

## 2021-01-15 DIAGNOSIS — M47816 Spondylosis without myelopathy or radiculopathy, lumbar region: Secondary | ICD-10-CM | POA: Diagnosis not present

## 2021-02-03 DIAGNOSIS — G8929 Other chronic pain: Secondary | ICD-10-CM | POA: Diagnosis not present

## 2021-02-03 DIAGNOSIS — M542 Cervicalgia: Secondary | ICD-10-CM | POA: Diagnosis not present

## 2021-02-03 DIAGNOSIS — M546 Pain in thoracic spine: Secondary | ICD-10-CM | POA: Diagnosis not present

## 2021-02-03 DIAGNOSIS — M25551 Pain in right hip: Secondary | ICD-10-CM | POA: Diagnosis not present

## 2021-02-03 DIAGNOSIS — M545 Low back pain, unspecified: Secondary | ICD-10-CM | POA: Diagnosis not present

## 2021-02-03 DIAGNOSIS — M6281 Muscle weakness (generalized): Secondary | ICD-10-CM | POA: Diagnosis not present

## 2021-02-05 ENCOUNTER — Other Ambulatory Visit: Payer: Self-pay

## 2021-02-05 ENCOUNTER — Ambulatory Visit
Admission: RE | Admit: 2021-02-05 | Discharge: 2021-02-05 | Disposition: A | Discharge status: Home / Self Care | Payer: PPO | Source: Ambulatory Visit | Attending: Internal Medicine | Admitting: Internal Medicine

## 2021-02-05 DIAGNOSIS — I517 Cardiomegaly: Secondary | ICD-10-CM | POA: Diagnosis not present

## 2021-02-05 DIAGNOSIS — J9811 Atelectasis: Secondary | ICD-10-CM | POA: Diagnosis not present

## 2021-02-05 DIAGNOSIS — Z1231 Encounter for screening mammogram for malignant neoplasm of breast: Secondary | ICD-10-CM | POA: Diagnosis not present

## 2021-02-05 DIAGNOSIS — M4602 Spinal enthesopathy, cervical region: Secondary | ICD-10-CM | POA: Diagnosis not present

## 2021-02-05 DIAGNOSIS — M47812 Spondylosis without myelopathy or radiculopathy, cervical region: Secondary | ICD-10-CM | POA: Diagnosis not present

## 2021-02-24 DIAGNOSIS — I10 Essential (primary) hypertension: Secondary | ICD-10-CM | POA: Diagnosis not present

## 2021-02-24 DIAGNOSIS — I2510 Atherosclerotic heart disease of native coronary artery without angina pectoris: Secondary | ICD-10-CM | POA: Diagnosis not present

## 2021-02-24 DIAGNOSIS — F17200 Nicotine dependence, unspecified, uncomplicated: Secondary | ICD-10-CM | POA: Diagnosis not present

## 2021-02-24 DIAGNOSIS — T464X5A Adverse effect of angiotensin-converting-enzyme inhibitors, initial encounter: Secondary | ICD-10-CM | POA: Diagnosis not present

## 2021-02-24 DIAGNOSIS — G629 Polyneuropathy, unspecified: Secondary | ICD-10-CM | POA: Diagnosis not present

## 2021-02-24 DIAGNOSIS — T783XXA Angioneurotic edema, initial encounter: Secondary | ICD-10-CM | POA: Diagnosis not present

## 2021-02-24 DIAGNOSIS — R0602 Shortness of breath: Secondary | ICD-10-CM | POA: Diagnosis not present

## 2021-02-24 DIAGNOSIS — N183 Chronic kidney disease, stage 3 unspecified: Secondary | ICD-10-CM | POA: Diagnosis not present

## 2021-02-24 DIAGNOSIS — R55 Syncope and collapse: Secondary | ICD-10-CM | POA: Diagnosis not present

## 2021-02-24 DIAGNOSIS — E7800 Pure hypercholesterolemia, unspecified: Secondary | ICD-10-CM | POA: Diagnosis not present

## 2021-02-24 DIAGNOSIS — E1142 Type 2 diabetes mellitus with diabetic polyneuropathy: Secondary | ICD-10-CM | POA: Diagnosis not present

## 2021-02-24 DIAGNOSIS — R011 Cardiac murmur, unspecified: Secondary | ICD-10-CM | POA: Diagnosis not present

## 2021-02-28 DIAGNOSIS — G8929 Other chronic pain: Secondary | ICD-10-CM | POA: Diagnosis not present

## 2021-02-28 DIAGNOSIS — M545 Low back pain, unspecified: Secondary | ICD-10-CM | POA: Diagnosis not present

## 2021-03-06 DIAGNOSIS — M545 Low back pain, unspecified: Secondary | ICD-10-CM | POA: Diagnosis not present

## 2021-03-06 DIAGNOSIS — G8929 Other chronic pain: Secondary | ICD-10-CM | POA: Diagnosis not present

## 2021-03-12 DIAGNOSIS — M6281 Muscle weakness (generalized): Secondary | ICD-10-CM | POA: Diagnosis not present

## 2021-03-12 DIAGNOSIS — M545 Low back pain, unspecified: Secondary | ICD-10-CM | POA: Diagnosis not present

## 2021-03-12 DIAGNOSIS — G8929 Other chronic pain: Secondary | ICD-10-CM | POA: Diagnosis not present

## 2021-03-12 DIAGNOSIS — M25551 Pain in right hip: Secondary | ICD-10-CM | POA: Diagnosis not present

## 2021-03-18 DIAGNOSIS — M4316 Spondylolisthesis, lumbar region: Secondary | ICD-10-CM | POA: Diagnosis not present

## 2021-03-18 DIAGNOSIS — M1611 Unilateral primary osteoarthritis, right hip: Secondary | ICD-10-CM | POA: Diagnosis not present

## 2021-03-18 DIAGNOSIS — M76891 Other specified enthesopathies of right lower limb, excluding foot: Secondary | ICD-10-CM | POA: Diagnosis not present

## 2021-03-18 DIAGNOSIS — M47818 Spondylosis without myelopathy or radiculopathy, sacral and sacrococcygeal region: Secondary | ICD-10-CM | POA: Diagnosis not present

## 2021-03-18 DIAGNOSIS — M47816 Spondylosis without myelopathy or radiculopathy, lumbar region: Secondary | ICD-10-CM | POA: Diagnosis not present

## 2021-03-18 DIAGNOSIS — M25551 Pain in right hip: Secondary | ICD-10-CM | POA: Diagnosis not present

## 2021-03-18 DIAGNOSIS — M7061 Trochanteric bursitis, right hip: Secondary | ICD-10-CM | POA: Diagnosis not present

## 2021-03-18 DIAGNOSIS — G8929 Other chronic pain: Secondary | ICD-10-CM | POA: Diagnosis not present

## 2021-03-19 ENCOUNTER — Other Ambulatory Visit: Payer: Self-pay | Admitting: Sports Medicine

## 2021-03-19 ENCOUNTER — Other Ambulatory Visit (HOSPITAL_COMMUNITY): Payer: Self-pay | Admitting: Sports Medicine

## 2021-03-19 DIAGNOSIS — G8929 Other chronic pain: Secondary | ICD-10-CM

## 2021-03-19 DIAGNOSIS — M4316 Spondylolisthesis, lumbar region: Secondary | ICD-10-CM

## 2021-03-19 DIAGNOSIS — M47816 Spondylosis without myelopathy or radiculopathy, lumbar region: Secondary | ICD-10-CM

## 2021-03-19 DIAGNOSIS — M25551 Pain in right hip: Secondary | ICD-10-CM

## 2021-04-01 ENCOUNTER — Other Ambulatory Visit: Payer: Self-pay

## 2021-04-01 ENCOUNTER — Ambulatory Visit
Admission: RE | Admit: 2021-04-01 | Discharge: 2021-04-01 | Disposition: A | Discharge status: Home / Self Care | Payer: PPO | Source: Ambulatory Visit | Attending: Sports Medicine | Admitting: Sports Medicine

## 2021-04-01 DIAGNOSIS — G8929 Other chronic pain: Secondary | ICD-10-CM | POA: Diagnosis not present

## 2021-04-01 DIAGNOSIS — M47816 Spondylosis without myelopathy or radiculopathy, lumbar region: Secondary | ICD-10-CM | POA: Insufficient documentation

## 2021-04-01 DIAGNOSIS — M4316 Spondylolisthesis, lumbar region: Secondary | ICD-10-CM | POA: Insufficient documentation

## 2021-04-01 DIAGNOSIS — M25551 Pain in right hip: Secondary | ICD-10-CM | POA: Insufficient documentation

## 2021-04-01 DIAGNOSIS — M545 Low back pain, unspecified: Secondary | ICD-10-CM | POA: Diagnosis not present

## 2021-04-03 DIAGNOSIS — R700 Elevated erythrocyte sedimentation rate: Secondary | ICD-10-CM | POA: Diagnosis not present

## 2021-04-03 DIAGNOSIS — M5136 Other intervertebral disc degeneration, lumbar region: Secondary | ICD-10-CM | POA: Diagnosis not present

## 2021-04-07 ENCOUNTER — Other Ambulatory Visit: Payer: Self-pay | Admitting: Sports Medicine

## 2021-04-07 DIAGNOSIS — G8929 Other chronic pain: Secondary | ICD-10-CM

## 2021-04-07 DIAGNOSIS — M47816 Spondylosis without myelopathy or radiculopathy, lumbar region: Secondary | ICD-10-CM

## 2021-04-07 DIAGNOSIS — R937 Abnormal findings on diagnostic imaging of other parts of musculoskeletal system: Secondary | ICD-10-CM

## 2021-04-09 ENCOUNTER — Other Ambulatory Visit (HOSPITAL_COMMUNITY): Payer: Self-pay | Admitting: Physical Medicine & Rehabilitation

## 2021-04-09 ENCOUNTER — Ambulatory Visit
Admission: RE | Admit: 2021-04-09 | Discharge: 2021-04-09 | Disposition: A | Discharge status: Home / Self Care | Payer: PPO | Source: Ambulatory Visit | Attending: Sports Medicine | Admitting: Sports Medicine

## 2021-04-09 DIAGNOSIS — M48061 Spinal stenosis, lumbar region without neurogenic claudication: Secondary | ICD-10-CM | POA: Diagnosis not present

## 2021-04-09 DIAGNOSIS — R937 Abnormal findings on diagnostic imaging of other parts of musculoskeletal system: Secondary | ICD-10-CM | POA: Insufficient documentation

## 2021-04-09 DIAGNOSIS — M5441 Lumbago with sciatica, right side: Secondary | ICD-10-CM | POA: Diagnosis not present

## 2021-04-09 DIAGNOSIS — G8929 Other chronic pain: Secondary | ICD-10-CM | POA: Diagnosis not present

## 2021-04-09 DIAGNOSIS — M48062 Spinal stenosis, lumbar region with neurogenic claudication: Secondary | ICD-10-CM | POA: Diagnosis not present

## 2021-04-09 DIAGNOSIS — M25551 Pain in right hip: Secondary | ICD-10-CM

## 2021-04-09 DIAGNOSIS — M47816 Spondylosis without myelopathy or radiculopathy, lumbar region: Secondary | ICD-10-CM | POA: Insufficient documentation

## 2021-04-09 DIAGNOSIS — R531 Weakness: Secondary | ICD-10-CM | POA: Diagnosis not present

## 2021-04-09 DIAGNOSIS — D492 Neoplasm of unspecified behavior of bone, soft tissue, and skin: Secondary | ICD-10-CM | POA: Diagnosis not present

## 2021-04-09 MED ORDER — GADOBUTROL 1 MMOL/ML IV SOLN
7.0000 mL | Freq: Once | INTRAVENOUS | Status: AC | PRN
  Administered 2021-04-09: 7 mL via INTRAVENOUS

## 2021-04-15 DIAGNOSIS — D492 Neoplasm of unspecified behavior of bone, soft tissue, and skin: Secondary | ICD-10-CM | POA: Diagnosis not present

## 2021-04-15 DIAGNOSIS — M5441 Lumbago with sciatica, right side: Secondary | ICD-10-CM | POA: Diagnosis not present

## 2021-04-15 DIAGNOSIS — M48062 Spinal stenosis, lumbar region with neurogenic claudication: Secondary | ICD-10-CM | POA: Diagnosis not present

## 2021-04-15 DIAGNOSIS — G8929 Other chronic pain: Secondary | ICD-10-CM | POA: Diagnosis not present

## 2021-04-24 DIAGNOSIS — M25551 Pain in right hip: Secondary | ICD-10-CM | POA: Diagnosis not present

## 2021-04-24 DIAGNOSIS — G629 Polyneuropathy, unspecified: Secondary | ICD-10-CM | POA: Diagnosis not present

## 2021-04-28 DIAGNOSIS — M48062 Spinal stenosis, lumbar region with neurogenic claudication: Secondary | ICD-10-CM | POA: Diagnosis not present

## 2021-04-28 DIAGNOSIS — E1142 Type 2 diabetes mellitus with diabetic polyneuropathy: Secondary | ICD-10-CM | POA: Diagnosis not present

## 2021-05-08 ENCOUNTER — Other Ambulatory Visit: Payer: Self-pay

## 2021-05-08 ENCOUNTER — Ambulatory Visit
Payer: PPO | Attending: Student in an Organized Health Care Education/Training Program | Admitting: Student in an Organized Health Care Education/Training Program

## 2021-05-08 ENCOUNTER — Encounter: Payer: Self-pay | Admitting: Student in an Organized Health Care Education/Training Program

## 2021-05-08 VITALS — BP 115/60 | HR 74 | Temp 97.3°F | Resp 16 | Ht 63.0 in | Wt 185.4 lb

## 2021-05-08 DIAGNOSIS — M47816 Spondylosis without myelopathy or radiculopathy, lumbar region: Secondary | ICD-10-CM | POA: Insufficient documentation

## 2021-05-08 DIAGNOSIS — Z96652 Presence of left artificial knee joint: Secondary | ICD-10-CM | POA: Diagnosis not present

## 2021-05-08 DIAGNOSIS — M5136 Other intervertebral disc degeneration, lumbar region: Secondary | ICD-10-CM | POA: Insufficient documentation

## 2021-05-08 DIAGNOSIS — M48062 Spinal stenosis, lumbar region with neurogenic claudication: Secondary | ICD-10-CM | POA: Insufficient documentation

## 2021-05-08 NOTE — Progress Notes (Signed)
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 Left lower extremity  Stability: No instability observed          Stability: No instability observed          Skin & Extremity Inspection: Skin color, temperature, and hair growth are WNL. No peripheral edema or cyanosis. No masses, redness, swelling, asymmetry, or associated skin lesions. No contractures.  Skin & Extremity Inspection: Evidence of prior arthroplastic surgery  Functional ROM: Unrestricted ROM                  Functional ROM: Unrestricted ROM                  Muscle Tone/Strength: Functionally intact. No obvious neuro-muscular anomalies detected.  Muscle Tone/Strength: Functionally intact. No obvious neuro-muscular anomalies detected.  Sensory  (Neurological): Unimpaired        Sensory (Neurological): Movement-associated discomfort        DTR: Patellar: deferred today Achilles: deferred today Plantar: deferred today  DTR: Patellar: deferred today Achilles: deferred today Plantar: deferred today  Palpation: No palpable anomalies  Palpation: No palpable anomalies    Assessment  Primary Diagnosis & Pertinent Problem List: The primary encounter diagnosis was Spinal stenosis, lumbar region, with neurogenic claudication. Diagnoses  of Lumbar spondylosis, Lumbar degenerative disc disease, and Status post total knee replacement using cement, left were also pertinent to this visit.  Visit Diagnosis (New problems to examiner): 1. Spinal stenosis, lumbar region, with neurogenic claudication   2. Lumbar spondylosis   3. Lumbar degenerative disc disease   4. Status post total knee replacement using cement, left    Plan of Care (Initial workup plan)   I have limited options for this patient as she is seeing many providers including neurology, physical medicine and rehab, rheumatology.  She states that this is taking a strain on her finances.  She has tried various medications including NSAIDs, neuropathics, APAP, opioids with limited response.  She is on gabapentin, cymbalta, ALA that is managed by neurology.  She can continue with them.  She had spinal injections for spinal stenosis done by Dr. Alba Destine.  Unclear why she is seeing me as referral was made by Dr Manuella Ghazi for lumbar radicular pain but she is already established with Dr Alba Destine.  She can continue care there.  I did discuss left genicular nerve block and possible RFA for her left knee pain if that flares up in future.   Provider-requested follow-up: No follow-ups on file.  No future appointments.  Note by: Gillis Santa, MD Date: 05/08/2021; Time: 10:07 AM

## 2021-05-08 NOTE — Progress Notes (Signed)
Safety precautions to be maintained throughout the outpatient stay will include: orient to surroundings, keep bed in low position, maintain call bell within reach at all times, provide assistance with transfer out of bed and ambulation.  

## 2021-05-13 DIAGNOSIS — G8929 Other chronic pain: Secondary | ICD-10-CM | POA: Diagnosis not present

## 2021-05-13 DIAGNOSIS — M5441 Lumbago with sciatica, right side: Secondary | ICD-10-CM | POA: Diagnosis not present

## 2021-05-13 DIAGNOSIS — M48062 Spinal stenosis, lumbar region with neurogenic claudication: Secondary | ICD-10-CM | POA: Diagnosis not present

## 2021-05-15 DIAGNOSIS — M48062 Spinal stenosis, lumbar region with neurogenic claudication: Secondary | ICD-10-CM | POA: Diagnosis not present

## 2021-06-05 ENCOUNTER — Other Ambulatory Visit: Payer: Self-pay | Admitting: Neurosurgery

## 2021-06-17 ENCOUNTER — Other Ambulatory Visit: Payer: Self-pay

## 2021-06-17 ENCOUNTER — Encounter
Admission: RE | Admit: 2021-06-17 | Discharge: 2021-06-17 | Disposition: A | Discharge status: Home / Self Care | Payer: No Typology Code available for payment source | Source: Ambulatory Visit | Attending: Neurosurgery | Admitting: Neurosurgery

## 2021-06-17 ENCOUNTER — Encounter: Payer: Self-pay | Admitting: Neurosurgery

## 2021-06-17 DIAGNOSIS — Z01818 Encounter for other preprocedural examination: Secondary | ICD-10-CM | POA: Diagnosis not present

## 2021-06-17 HISTORY — DX: Atherosclerotic heart disease of native coronary artery without angina pectoris: I25.10

## 2021-06-17 LAB — BASIC METABOLIC PANEL
Anion gap: 11 (ref 5–15)
BUN: 37 mg/dL — ABNORMAL HIGH (ref 8–23)
CO2: 29 mmol/L (ref 22–32)
Calcium: 9.4 mg/dL (ref 8.9–10.3)
Chloride: 98 mmol/L (ref 98–111)
Creatinine, Ser: 0.92 mg/dL (ref 0.44–1.00)
GFR, Estimated: >60 mL/min (ref >60)
Glucose, Bld: 117 mg/dL — ABNORMAL HIGH (ref 70–99)
Potassium: 3.3 mmol/L — ABNORMAL LOW (ref 3.5–5.1)
Sodium: 138 mmol/L (ref 135–145)

## 2021-06-17 LAB — URINALYSIS, ROUTINE W REFLEX MICROSCOPIC
Bilirubin Urine: NEGATIVE
Glucose, UA: NEGATIVE mg/dL
Hgb urine dipstick: NEGATIVE
Ketones, ur: 5 mg/dL — AB
Leukocytes,Ua: NEGATIVE
Nitrite: NEGATIVE
Protein, ur: NEGATIVE mg/dL
Specific Gravity, Urine: 1.018 (ref 1.005–1.030)
pH: 5 (ref 5.0–8.0)

## 2021-06-17 LAB — CBC
HCT: 33.2 % — ABNORMAL LOW (ref 36.0–46.0)
Hemoglobin: 11 g/dL — ABNORMAL LOW (ref 12.0–15.0)
MCH: 33.7 pg (ref 26.0–34.0)
MCHC: 33.1 g/dL (ref 30.0–36.0)
MCV: 101.8 fL — ABNORMAL HIGH (ref 80.0–100.0)
Platelets: 178 10*3/uL (ref 150–400)
RBC: 3.26 MIL/uL — ABNORMAL LOW (ref 3.87–5.11)
RDW: 13.7 % (ref 11.5–15.5)
WBC: 6.7 10*3/uL (ref 4.0–10.5)
nRBC: 0 % (ref 0.0–0.2)

## 2021-06-17 LAB — SURGICAL PCR SCREEN
MRSA, PCR: NEGATIVE
Staphylococcus aureus: NEGATIVE

## 2021-06-17 LAB — TYPE AND SCREEN
ABO/RH(D): O POS
Antibody Screen: NEGATIVE

## 2021-06-17 LAB — PROTIME-INR
INR: 1.1 (ref 0.8–1.2)
Prothrombin Time: 14.1 seconds (ref 11.4–15.2)

## 2021-06-17 LAB — APTT: aPTT: 32 seconds (ref 24–36)

## 2021-06-17 NOTE — Patient Instructions (Addendum)
Your procedure is scheduled on: 06/25/2021 Report to the Registration Desk on the 1st floor of the [facility]. To find out your arrival time, please call [phone] between 1PM - 3PM on: 06/24/2021  REMEMBER: Instructions that are not followed completely may result in serious medical risk, up to and including death; or upon the discretion of your surgeon and anesthesiologist your surgery may need to be rescheduled.  Do not eat food after midnight the night before surgery.  No gum chewing, lozengers or hard candies.  You may however, drink water up to 2 hours before you are scheduled to arrive for your surgery. Do not drink anything within 2 hours of your scheduled arrival time.  TAKE THESE MEDICATIONS THE MORNING OF SURGERY WITH A SIP OF WATER: - amLODipine (NORVASC) 5 MG tablet - carvedilol (COREG) 6.25 MG tablet - DULoxetine (CYMBALTA) 30 MG capsule  Stop taking metFORMIN (GLUCOPHAGE-XR) 500 MG 24 hr tablet 2 days prior to surgery. Last dose: 06/22/2021  Follow recommendations from Cardiologist, Pulmonologist or PCP regarding stopping Aspirin, Coumadin, Plavix, Eliquis, Pradaxa, or Pletal. - Dr. Rhea Bleacher office has instructed that you continue to take your aspirin up until surgery. Do not take the morning of surgery.  .  One week prior to surgery: Stop Anti-inflammatories (NSAIDS) such as Advil, Aleve, Ibuprofen, Motrin, Naproxen, Naprosyn and Aspirin based products such as Excedrin, Goodys Powder, BC Powder. You may however, continue to take Tylenol if needed for pain up until the day of surgery.  Stop any over the counter vitamins and supplements until after surgery.  No Alcohol for 24 hours before or after surgery.  No Smoking including e-cigarettes for 24 hours prior to surgery.  No chewable tobacco products for at least 6 hours prior to surgery.  No nicotine patches on the day of surgery.  Do not use any "recreational" drugs for at least a week prior to your surgery.   Please be advised that the combination of cocaine and anesthesia may have negative outcomes, up to and including death. If you test positive for cocaine, your surgery will be cancelled.  On the morning of surgery brush your teeth with toothpaste and water, you may rinse your mouth with mouthwash if you wish. Do not swallow any toothpaste or mouthwash.  Use CHG Soap or wipes as directed on instruction sheet.  Do not wear jewelry, make-up, hairpins, clips or nail polish.  Do not wear lotions, powders, or perfumes.   Do not shave body from the neck down 48 hours prior to surgery just in case you cut yourself which could leave a site for infection.  Also, freshly shaved skin may become irritated if using the CHG soap.  Do not bring valuables to the hospital. [facility] is not responsible for any missing/lost belongings or valuables.   Notify your doctor if there is any change in your medical condition (cold, fever, infection).  Wear comfortable clothing (specific to your surgery type) to the hospital.  If you are being admitted to the hospital overnight, leave your suitcase in the car. After surgery it may be brought to your room.  If you are being discharged the day of surgery, you will not be allowed to drive home. You will need a responsible adult (18 years or older) to drive you home and stay with you that night.   If you are taking public transportation, you will need to have a responsible adult (18 years or older) with you. Please confirm with your physician that it  is acceptable to use public transportation.   Please call the [facility] at [phone] if you have any questions about these instructions.  Surgery Visitation Policy:  Patients undergoing a surgery or procedure may have one family member or support person in the surgical waiting area with them as long as that person is not COVID-19 positive or experiencing its symptoms.  There are no visitors  allowed in the preoperative area.   Inpatient Visitation: Admission to a hospital floor  Visiting hours are 7 a.m. to 8 p.m. Up to two visitors ages 16+ are allowed at one time in a patient room. The visitors may rotate out with other people during the day. Visitors must check out when they leave, or other visitors will not be allowed. One designated support person may remain overnight. The visitor must pass COVID-19 screenings, use hand sanitizer when entering and exiting the patients room and wear a mask at all times, including in the patients room. Patients must also wear a mask when staff or their visitor are in the room. Masking is required regardless of vaccination status.

## 2021-06-17 NOTE — Progress Notes (Signed)
Upcoming lumbar decompression surgery. History of CAD, HTN, CABG. Cardiology clearance form and today's EKG faxed to Dr. Etta Quill office. Today's K+ level low at  3.3; currently takes KCL 10 meq. daily. Abnormal lab results faxed to Dr. Rhea Bleacher office to address the need for additional K+ supplement prior to surgery. Order placed to recheck K+ level on the day of surgery.

## 2021-06-17 NOTE — Patient Instructions (Signed)
Your procedure is scheduled on: 06/25/2021 Report to the Registration Desk on the 1st floor of the [facility]. To find out your arrival time, please call [phone] between 1PM - 3PM on: 06/24/2021  REMEMBER: Instructions that are not followed completely may result in serious medical risk, up to and including death; or upon the discretion of your surgeon and anesthesiologist your surgery may need to be rescheduled.  Do not eat food after midnight the night before surgery.  No gum chewing, lozengers or hard candies.  You may however, drink water up to 2 hours before you are scheduled to arrive for your surgery. Do not drink anything within 2 hours of your scheduled arrival time.  TAKE THESE MEDICATIONS THE MORNING OF SURGERY WITH A SIP OF WATER: - amLODipine (NORVASC) 5 MG tablet - carvedilol (COREG) 6.25 MG tablet - DULoxetine (CYMBALTA) 30 MG capsule - HYDROcodone-acetaminophen (NORCO/VICODIN) 5-325 MG tablet if needed - (take one the night before and one on the morning of surgery - helps to prevent nausea after surgery.)  Stop taking metFORMIN (GLUCOPHAGE-XR) 500 MG 24 hr tablet 2 days prior to surgery. Last dose: 06/22/2021  Follow recommendations from Cardiologist, Pulmonologist or PCP regarding stopping Aspirin, Coumadin, Plavix, Eliquis, Pradaxa, or Pletal. Dr. Rhea Bleacher office has instructed for you to continue taking your aspirin. Do not take morning of surgery.   One week prior to surgery: Stop Anti-inflammatories (NSAIDS) such as Advil, Aleve, Ibuprofen, Motrin, Naproxen, Naprosyn and Aspirin based products such as Excedrin, Goodys Powder, BC Powder. You may however, continue to take Tylenol if needed for pain up until the day of surgery.  Stop any over the counter vitamins and supplements until after surgery.   No Alcohol for 24 hours before or after surgery.  No Smoking including e-cigarettes for 24 hours prior to surgery.  No chewable tobacco products for at  least 6 hours prior to surgery.  No nicotine patches on the day of surgery.  Do not use any "recreational" drugs for at least a week prior to your surgery.  Please be advised that the combination of cocaine and anesthesia may have negative outcomes, up to and including death. If you test positive for cocaine, your surgery will be cancelled.  On the morning of surgery brush your teeth with toothpaste and water, you may rinse your mouth with mouthwash if you wish. Do not swallow any toothpaste or mouthwash.  Use CHG Soap or wipes as directed on instruction sheet.  Do not wear jewelry, make-up, hairpins, clips or nail polish.  Do not wear lotions, powders, or perfumes.   Do not shave body from the neck down 48 hours prior to surgery just in case you cut yourself which could leave a site for infection.  Also, freshly shaved skin may become irritated if using the CHG soap.  Do not bring valuables to the hospital. [facility] is not responsible for any missing/lost belongings or valuables.   Bring your C-PAP to the hospital with you in case you may have to spend the night.   Notify your doctor if there is any change in your medical condition (cold, fever, infection).  Wear comfortable clothing (specific to your surgery type) to the hospital.  After surgery, you can help prevent lung complications by doing breathing exercises.  Take deep breaths and cough every 1-2 hours. Your doctor may order a device called an Incentive Spirometer to help you take deep breaths.  When coughing or sneezing, hold a pillow firmly against your incision  with both hands. This is called splinting. Doing this helps protect your incision. It also decreases belly discomfort.  If you are being admitted to the hospital overnight, leave your suitcase in the car. After surgery it may be brought to your room.  If you are being discharged the day of surgery, you will not be allowed to drive home. You will need a  responsible adult (18 years or older) to drive you home and stay with you that night.   If you are taking public transportation, you will need to have a responsible adult (18 years or older) with you. Please confirm with your physician that it is acceptable to use public transportation.   Please call the [facility] at [phone] if you have any questions about these instructions.  Surgery Visitation Policy:  Patients undergoing a surgery or procedure may have one family member or support person in the surgical waiting area with them as long as that person is not COVID-19 positive or experiencing its symptoms.  There are no visitors allowed in the preoperative area.   Inpatient Visitation:    Visiting hours are 7 a.m. to 8 p.m. Up to two visitors ages 16+ are allowed at one time in a patient room. The visitors may rotate out with other people during the day. Visitors must check out when they leave, or other visitors will not be allowed. One designated support person may remain overnight. The visitor must pass COVID-19 screenings, use hand sanitizer when entering and exiting the patients room and wear a mask at all times, including in the patients room. Patients must also wear a mask when staff or their visitor are in the room. Masking is required regardless of vaccination status.

## 2021-06-19 NOTE — Pre-Procedure Instructions (Signed)
Cardiac clearance received from Dr Guillermina [place] Risk-Clearance on chart

## 2021-06-24 MED ORDER — SODIUM CHLORIDE 0.9 % IV SOLN: INTRAVENOUS | Status: DC

## 2021-06-24 MED ORDER — CHLORHEXIDINE GLUCONATE 0.12 % MT SOLN: 15.0000 mL | Freq: Once | OROMUCOSAL | Status: AC

## 2021-06-24 MED ORDER — CEFAZOLIN SODIUM-DEXTROSE 2-4 GM/100ML-% IV SOLN
2.0000 g | INTRAVENOUS | Status: AC
  Administered 2021-06-25: 2 g via INTRAVENOUS

## 2021-06-24 MED ORDER — ORAL CARE MOUTH RINSE: 15.0000 mL | Freq: Once | OROMUCOSAL | Status: AC

## 2021-06-24 MED ORDER — FAMOTIDINE 20 MG PO TABS: 20.0000 mg | ORAL_TABLET | Freq: Once | ORAL | Status: AC

## 2021-06-24 NOTE — Progress Notes (Signed)
Pharmacy Antibiotic Note  Renee [name] is a 71 y.o. female admitted on (Not on file) with surgical prophylaxis.  Pharmacy has been consulted for Cefazolin dosing.  Plan: TBW = 83.8 kg   Cefazolin 2 gm IV X 1 60 min pre-op ordered for 1/18 @ 0500.   Height: 5\' 3"  (160 cm) Weight: 83.8 kg (184 lb 11.9 oz) IBW/kg (Calculated) : 52.4  No data recorded.  No results for input(s): WBC, CREATININE, LATICACIDVEN, VANCOTROUGH, VANCOPEAK, VANCORANDOM, GENTTROUGH, GENTPEAK, GENTRANDOM, TOBRATROUGH, TOBRAPEAK, TOBRARND, AMIKACINPEAK, AMIKACINTROU, AMIKACIN in the last 168 hours.  Estimated Creatinine Clearance: 57.6 mL/min (by C-G formula based on SCr of 0.92 mg/dL).    Allergies  Allergen Reactions   Codeine Nausea And Vomiting   Tramadol Nausea And Vomiting    Antimicrobials this admission:   >>    >>   Dose adjustments this admission:   Microbiology results:  BCx:   UCx:    Sputum:    MRSA PCR:   Thank you for allowing pharmacy to be a part of this patients care.  Carrington Olazabal D 06/24/2021 11:05 PM

## 2021-06-25 ENCOUNTER — Ambulatory Visit: Payer: No Typology Code available for payment source

## 2021-06-25 ENCOUNTER — Ambulatory Visit
Admission: RE | Admit: 2021-06-25 | Discharge: 2021-06-25 | Disposition: A | Discharge status: Home / Self Care | Payer: No Typology Code available for payment source | Attending: Neurosurgery | Admitting: Neurosurgery

## 2021-06-25 ENCOUNTER — Encounter: Payer: Self-pay | Admitting: Neurosurgery

## 2021-06-25 ENCOUNTER — Encounter
Admission: RE | Disposition: A | Discharge status: Home / Self Care | Payer: Self-pay | Source: Home / Self Care | Attending: Neurosurgery

## 2021-06-25 ENCOUNTER — Other Ambulatory Visit: Payer: Self-pay

## 2021-06-25 DIAGNOSIS — Z79899 Other long term (current) drug therapy: Secondary | ICD-10-CM | POA: Insufficient documentation

## 2021-06-25 DIAGNOSIS — I10 Essential (primary) hypertension: Secondary | ICD-10-CM | POA: Insufficient documentation

## 2021-06-25 DIAGNOSIS — I2510 Atherosclerotic heart disease of native coronary artery without angina pectoris: Secondary | ICD-10-CM | POA: Diagnosis not present

## 2021-06-25 DIAGNOSIS — M48062 Spinal stenosis, lumbar region with neurogenic claudication: Secondary | ICD-10-CM | POA: Diagnosis not present

## 2021-06-25 DIAGNOSIS — Z7984 Long term (current) use of oral hypoglycemic drugs: Secondary | ICD-10-CM | POA: Insufficient documentation

## 2021-06-25 DIAGNOSIS — F17200 Nicotine dependence, unspecified, uncomplicated: Secondary | ICD-10-CM | POA: Insufficient documentation

## 2021-06-25 DIAGNOSIS — M4316 Spondylolisthesis, lumbar region: Secondary | ICD-10-CM | POA: Diagnosis not present

## 2021-06-25 DIAGNOSIS — Z951 Presence of aortocoronary bypass graft: Secondary | ICD-10-CM | POA: Insufficient documentation

## 2021-06-25 DIAGNOSIS — E119 Type 2 diabetes mellitus without complications: Secondary | ICD-10-CM | POA: Diagnosis not present

## 2021-06-25 DIAGNOSIS — K219 Gastro-esophageal reflux disease without esophagitis: Secondary | ICD-10-CM | POA: Insufficient documentation

## 2021-06-25 DIAGNOSIS — Z981 Arthrodesis status: Secondary | ICD-10-CM | POA: Diagnosis not present

## 2021-06-25 DIAGNOSIS — M1990 Unspecified osteoarthritis, unspecified site: Secondary | ICD-10-CM | POA: Insufficient documentation

## 2021-06-25 HISTORY — PX: LUMBAR LAMINECTOMY/DECOMPRESSION MICRODISCECTOMY: SHX5026

## 2021-06-25 LAB — POCT I-STAT, CHEM 8
BUN: 26 mg/dL — ABNORMAL HIGH (ref 8–23)
Calcium, Ion: 1.19 mmol/L (ref 1.15–1.40)
Chloride: 106 mmol/L (ref 98–111)
Creatinine, Ser: 0.9 mg/dL (ref 0.44–1.00)
Glucose, Bld: 134 mg/dL — ABNORMAL HIGH (ref 70–99)
HCT: 36 % (ref 36.0–46.0)
Hemoglobin: 12.2 g/dL (ref 12.0–15.0)
Potassium: 3.8 mmol/L (ref 3.5–5.1)
Sodium: 140 mmol/L (ref 135–145)
TCO2: 23 mmol/L (ref 22–32)

## 2021-06-25 LAB — GLUCOSE, CAPILLARY: Glucose-Capillary: 207 mg/dL — ABNORMAL HIGH (ref 70–99)

## 2021-06-25 SURGERY — LUMBAR LAMINECTOMY/DECOMPRESSION MICRODISCECTOMY 1 LEVEL
Anesthesia: General | Site: Spine Lumbar

## 2021-06-25 MED ORDER — BUPIVACAINE-EPINEPHRINE (PF) 0.5% -1:200000 IJ SOLN
INTRAMUSCULAR | Status: DC | PRN
  Administered 2021-06-25: 8 mL

## 2021-06-25 MED ORDER — PHENYLEPHRINE HCL-NACL 20-0.9 MG/250ML-% IV SOLN
INTRAVENOUS | Status: AC
  Filled 2021-06-25: qty 250

## 2021-06-25 MED ORDER — ONDANSETRON HCL 4 MG/2ML IJ SOLN: 4.0000 mg | Freq: Once | INTRAMUSCULAR | Status: DC | PRN

## 2021-06-25 MED ORDER — ACETAMINOPHEN 10 MG/ML IV SOLN: 1000.0000 mg | Freq: Once | INTRAVENOUS | Status: DC | PRN

## 2021-06-25 MED ORDER — PHENYLEPHRINE HCL (PRESSORS) 10 MG/ML IV SOLN
INTRAVENOUS | Status: DC | PRN
  Administered 2021-06-25 (×2): 160 ug via INTRAVENOUS

## 2021-06-25 MED ORDER — SENNA 8.6 MG PO TABS: 1.0000 | ORAL_TABLET | Freq: Every day | ORAL | 0 refills | Status: DC | PRN

## 2021-06-25 MED ORDER — PROPOFOL 10 MG/ML IV BOLUS
INTRAVENOUS | Status: AC
  Filled 2021-06-25: qty 20

## 2021-06-25 MED ORDER — PROPOFOL 10 MG/ML IV BOLUS
INTRAVENOUS | Status: DC | PRN
  Administered 2021-06-25: 150 mg via INTRAVENOUS

## 2021-06-25 MED ORDER — FENTANYL CITRATE (PF) 100 MCG/2ML IJ SOLN
INTRAMUSCULAR | Status: AC
  Filled 2021-06-25: qty 2

## 2021-06-25 MED ORDER — LIDOCAINE HCL (CARDIAC) PF 100 MG/5ML IV SOSY
PREFILLED_SYRINGE | INTRAVENOUS | Status: DC | PRN
  Administered 2021-06-25: 100 mg via INTRAVENOUS

## 2021-06-25 MED ORDER — FAMOTIDINE 20 MG PO TABS
ORAL_TABLET | ORAL | Status: AC
  Administered 2021-06-25: 20 mg via ORAL
  Filled 2021-06-25: qty 1

## 2021-06-25 MED ORDER — SODIUM CHLORIDE (PF) 0.9 % IJ SOLN
INTRAMUSCULAR | Status: DC | PRN
  Administered 2021-06-25: 60 mL

## 2021-06-25 MED ORDER — PROPOFOL 1000 MG/100ML IV EMUL
INTRAVENOUS | Status: AC
  Filled 2021-06-25: qty 100

## 2021-06-25 MED ORDER — BUPIVACAINE HCL (PF) 0.5 % IJ SOLN
INTRAMUSCULAR | Status: AC
  Filled 2021-06-25: qty 30

## 2021-06-25 MED ORDER — ACETAMINOPHEN 10 MG/ML IV SOLN
INTRAVENOUS | Status: DC | PRN
Start: 2021-06-25 — End: 2021-06-25
  Administered 2021-06-25: 1000 mg via INTRAVENOUS

## 2021-06-25 MED ORDER — PHENYLEPHRINE HCL-NACL 20-0.9 MG/250ML-% IV SOLN
INTRAVENOUS | Status: DC | PRN
  Administered 2021-06-25: 40 ug/min via INTRAVENOUS

## 2021-06-25 MED ORDER — CEFAZOLIN SODIUM-DEXTROSE 2-4 GM/100ML-% IV SOLN
INTRAVENOUS | Status: AC
  Filled 2021-06-25: qty 100

## 2021-06-25 MED ORDER — CHLORHEXIDINE GLUCONATE 0.12 % MT SOLN
OROMUCOSAL | Status: AC
  Administered 2021-06-25: 15 mL via OROMUCOSAL
  Filled 2021-06-25: qty 15

## 2021-06-25 MED ORDER — DEXAMETHASONE SODIUM PHOSPHATE 10 MG/ML IJ SOLN
INTRAMUSCULAR | Status: DC | PRN
  Administered 2021-06-25: 4 mg via INTRAVENOUS

## 2021-06-25 MED ORDER — OXYCODONE HCL 5 MG PO TABS: 5.0000 mg | ORAL_TABLET | ORAL | 0 refills | Status: AC | PRN

## 2021-06-25 MED ORDER — SURGIFLO WITH THROMBIN (HEMOSTATIC MATRIX KIT) OPTIME
TOPICAL | Status: DC | PRN
  Administered 2021-06-25: 1 via TOPICAL

## 2021-06-25 MED ORDER — EPHEDRINE SULFATE 50 MG/ML IJ SOLN
INTRAMUSCULAR | Status: DC | PRN
  Administered 2021-06-25: 5 mg via INTRAVENOUS
  Administered 2021-06-25 (×2): 10 mg via INTRAVENOUS

## 2021-06-25 MED ORDER — DEXAMETHASONE SODIUM PHOSPHATE 10 MG/ML IJ SOLN
INTRAMUSCULAR | Status: AC
  Filled 2021-06-25: qty 1

## 2021-06-25 MED ORDER — 0.9 % SODIUM CHLORIDE (POUR BTL) OPTIME
TOPICAL | Status: DC | PRN
  Administered 2021-06-25: 1000 mL

## 2021-06-25 MED ORDER — SEVOFLURANE IN SOLN
RESPIRATORY_TRACT | Status: AC
  Filled 2021-06-25: qty 250

## 2021-06-25 MED ORDER — BUPIVACAINE LIPOSOME 1.3 % IJ SUSP
INTRAMUSCULAR | Status: AC
  Filled 2021-06-25: qty 20

## 2021-06-25 MED ORDER — MIDAZOLAM HCL 2 MG/2ML IJ SOLN
INTRAMUSCULAR | Status: DC | PRN
Start: 2021-06-25 — End: 2021-06-25
  Administered 2021-06-25: 2 mg via INTRAVENOUS

## 2021-06-25 MED ORDER — SUCCINYLCHOLINE CHLORIDE 200 MG/10ML IV SOSY
PREFILLED_SYRINGE | INTRAVENOUS | Status: DC | PRN
  Administered 2021-06-25: 100 mg via INTRAVENOUS

## 2021-06-25 MED ORDER — REMIFENTANIL HCL 1 MG IV SOLR
INTRAVENOUS | Status: DC | PRN
Start: 2021-06-25 — End: 2021-06-25
  Administered 2021-06-25: .1 ug/kg/min via INTRAVENOUS

## 2021-06-25 MED ORDER — PROPOFOL 500 MG/50ML IV EMUL
INTRAVENOUS | Status: DC | PRN
  Administered 2021-06-25: 50 ug/kg/min via INTRAVENOUS

## 2021-06-25 MED ORDER — LIDOCAINE HCL (PF) 2 % IJ SOLN
INTRAMUSCULAR | Status: AC
  Filled 2021-06-25: qty 5

## 2021-06-25 MED ORDER — TIZANIDINE HCL 2 MG PO TABS: 2.0000 mg | ORAL_TABLET | Freq: Three times a day (TID) | ORAL | 0 refills | Status: DC

## 2021-06-25 MED ORDER — REMIFENTANIL HCL 1 MG IV SOLR
INTRAVENOUS | Status: AC
  Filled 2021-06-25: qty 1000

## 2021-06-25 MED ORDER — FENTANYL CITRATE (PF) 100 MCG/2ML IJ SOLN: 25.0000 ug | INTRAMUSCULAR | Status: DC | PRN

## 2021-06-25 MED ORDER — ONDANSETRON HCL 4 MG/2ML IJ SOLN
INTRAMUSCULAR | Status: DC | PRN
  Administered 2021-06-25: 4 mg via INTRAVENOUS

## 2021-06-25 MED ORDER — GLYCOPYRROLATE 0.2 MG/ML IJ SOLN
INTRAMUSCULAR | Status: DC | PRN
  Administered 2021-06-25: .2 mg via INTRAVENOUS

## 2021-06-25 MED ORDER — METHYLPREDNISOLONE ACETATE 40 MG/ML IJ SUSP
INTRAMUSCULAR | Status: AC
  Filled 2021-06-25: qty 1

## 2021-06-25 MED ORDER — BUPIVACAINE-EPINEPHRINE (PF) 0.5% -1:200000 IJ SOLN
INTRAMUSCULAR | Status: AC
  Filled 2021-06-25: qty 30

## 2021-06-25 MED ORDER — METHYLPREDNISOLONE ACETATE 40 MG/ML IJ SUSP
INTRAMUSCULAR | Status: DC | PRN
  Administered 2021-06-25: 40 mg

## 2021-06-25 MED ORDER — ACETAMINOPHEN 10 MG/ML IV SOLN
INTRAVENOUS | Status: AC
  Filled 2021-06-25: qty 100

## 2021-06-25 MED ORDER — GLYCOPYRROLATE 0.2 MG/ML IJ SOLN
INTRAMUSCULAR | Status: AC
  Filled 2021-06-25: qty 1

## 2021-06-25 MED ORDER — FENTANYL CITRATE (PF) 100 MCG/2ML IJ SOLN
INTRAMUSCULAR | Status: DC | PRN
  Administered 2021-06-25: 50 ug via INTRAVENOUS

## 2021-06-25 MED ORDER — ROCURONIUM BROMIDE 10 MG/ML (PF) SYRINGE
PREFILLED_SYRINGE | INTRAVENOUS | Status: AC
  Filled 2021-06-25: qty 10

## 2021-06-25 MED ORDER — MIDAZOLAM HCL 2 MG/2ML IJ SOLN
INTRAMUSCULAR | Status: AC
  Filled 2021-06-25: qty 2

## 2021-06-25 MED ORDER — VASOPRESSIN 20 UNIT/ML IV SOLN
INTRAVENOUS | Status: DC | PRN
Start: 2021-06-25 — End: 2021-06-25
  Administered 2021-06-25: 2 [IU] via INTRAVENOUS
  Administered 2021-06-25 (×2): 1 [IU] via INTRAVENOUS

## 2021-06-25 MED ORDER — SODIUM CHLORIDE FLUSH 0.9 % IV SOLN
INTRAVENOUS | Status: AC
  Filled 2021-06-25: qty 20

## 2021-06-25 MED ORDER — ONDANSETRON HCL 4 MG/2ML IJ SOLN
INTRAMUSCULAR | Status: AC
  Filled 2021-06-25: qty 2

## 2021-06-25 SURGICAL SUPPLY — 56 items
ADH SKN CLS APL DERMABOND .7 (GAUZE/BANDAGES/DRESSINGS) ×1
AGENT HMST KT MTR STRL THRMB (HEMOSTASIS) ×1
APL PRP STRL LF DISP 70% ISPRP (MISCELLANEOUS) ×2
BUR NEURO DRILL SOFT 3.0X3.8M (BURR) ×2 IMPLANT
CHLORAPREP W/TINT 26 (MISCELLANEOUS) ×4 IMPLANT
CNTNR SPEC 2.5X3XGRAD LEK (MISCELLANEOUS) ×1
CONT SPEC 4OZ STER OR WHT (MISCELLANEOUS) ×1
CONT SPEC 4OZ STRL OR WHT (MISCELLANEOUS) ×1
CONTAINER SPEC 2.5X3XGRAD LEK (MISCELLANEOUS) ×1 IMPLANT
COUNTER NEEDLE 20/40 LG (NEEDLE) ×2 IMPLANT
CUP MEDICINE 2OZ PLAST GRAD ST (MISCELLANEOUS) ×4 IMPLANT
DERMABOND ADVANCED (GAUZE/BANDAGES/DRESSINGS) ×1
DERMABOND ADVANCED .7 DNX12 (GAUZE/BANDAGES/DRESSINGS) ×1 IMPLANT
DRAPE C ARM PK CFD 31 SPINE (DRAPES) ×2 IMPLANT
DRAPE LAPAROTOMY 100X77 ABD (DRAPES) ×2 IMPLANT
DRAPE MICROSCOPE SPINE 48X150 (DRAPES) ×2 IMPLANT
DRAPE SURG 17X11 SM STRL (DRAPES) ×2 IMPLANT
DRSG OPSITE POSTOP 3X4 (GAUZE/BANDAGES/DRESSINGS) ×1 IMPLANT
ELECT CAUTERY BLADE TIP 2.5 (TIP) ×2
ELECT EZSTD 165MM 6.5IN (MISCELLANEOUS)
ELECT REM PT RETURN 9FT ADLT (ELECTROSURGICAL) ×2
ELECTRODE CAUTERY BLDE TIP 2.5 (TIP) ×1 IMPLANT
ELECTRODE EZSTD 165MM 6.5IN (MISCELLANEOUS) IMPLANT
ELECTRODE REM PT RTRN 9FT ADLT (ELECTROSURGICAL) ×1 IMPLANT
GAUZE 4X4 16PLY ~~LOC~~+RFID DBL (SPONGE) ×2 IMPLANT
GLOVE SURG SYN 6.5 ES PF (GLOVE) ×4 IMPLANT
GLOVE SURG SYN 6.5 PF PI (GLOVE) ×2 IMPLANT
GLOVE SURG SYN 8.5  E (GLOVE) ×6
GLOVE SURG SYN 8.5 E (GLOVE) ×3 IMPLANT
GLOVE SURG SYN 8.5 PF PI (GLOVE) ×3 IMPLANT
GLOVE SURG UNDER POLY LF SZ6.5 (GLOVE) ×2 IMPLANT
GOWN SRG LRG LVL 4 IMPRV REINF (GOWNS) ×1 IMPLANT
GOWN SRG XL LVL 3 NONREINFORCE (GOWNS) ×1 IMPLANT
GOWN STRL NON-REIN TWL XL LVL3 (GOWNS) ×2
GOWN STRL REIN LRG LVL4 (GOWNS) ×2
GRADUATE 1200CC STRL 31836 (MISCELLANEOUS) ×2 IMPLANT
KIT SPINAL PRONEVIEW (KITS) ×2 IMPLANT
MANIFOLD NEPTUNE II (INSTRUMENTS) ×2 IMPLANT
MARKER SKIN DUAL TIP RULER LAB (MISCELLANEOUS) ×3 IMPLANT
NDL SAFETY ECLIPSE 18X1.5 (NEEDLE) ×1 IMPLANT
NEEDLE HYPO 18GX1.5 SHARP (NEEDLE) ×2
NEEDLE HYPO 22GX1.5 SAFETY (NEEDLE) ×2 IMPLANT
NS IRRIG 1000ML POUR BTL (IV SOLUTION) ×2 IMPLANT
PACK LAMINECTOMY NEURO (CUSTOM PROCEDURE TRAY) ×2 IMPLANT
PAD ARMBOARD 7.5X6 YLW CONV (MISCELLANEOUS) ×2 IMPLANT
SURGIFLO W/THROMBIN 8M KIT (HEMOSTASIS) ×2 IMPLANT
SUT DVC VLOC 3-0 CL 6 P-12 (SUTURE) ×2 IMPLANT
SUT VIC AB 0 CT1 27 (SUTURE) ×2
SUT VIC AB 0 CT1 27XCR 8 STRN (SUTURE) ×1 IMPLANT
SUT VIC AB 2-0 CT1 18 (SUTURE) ×2 IMPLANT
SYR 10ML LL (SYRINGE) ×2 IMPLANT
SYR 20ML LL LF (SYRINGE) ×2 IMPLANT
SYR 30ML LL (SYRINGE) ×4 IMPLANT
SYR 3ML LL SCALE MARK (SYRINGE) ×2 IMPLANT
TOWEL OR 17X26 4PK STRL BLUE (TOWEL DISPOSABLE) ×6 IMPLANT
TUBING CONNECTING 10 (TUBING) ×2 IMPLANT

## 2021-06-25 NOTE — Addendum Note (Signed)
Addendum  created 06/25/21 1212 by Aline Brochure, CRNA   Intraprocedure Staff edited

## 2021-06-25 NOTE — Transfer of Care (Signed)
Immediate Anesthesia Transfer of Care Note  Patient: Renee [name]  Procedure(s) Performed: L2-3 DECOMPRESSION (Spine Lumbar)  Patient Location: PACU  Anesthesia Type:General  Level of Consciousness: drowsy  Airway & Oxygen Therapy: Patient Spontanous Breathing and Patient connected to face mask oxygen  Post-op Assessment: Report given to RN and Post -op Vital signs reviewed and stable  Post vital signs: Reviewed and stable  Last Vitals:  Vitals Value Taken Time  BP 154/82 06/25/21 1010  Temp    Pulse 86 06/25/21 1010  Resp 15 06/25/21 1010  SpO2 99 % 06/25/21 1010  Vitals shown include unvalidated device data.  Last Pain:  Vitals:   06/25/21 0730  TempSrc:   PainSc: 0-No pain         Complications: No notable events documented.

## 2021-06-25 NOTE — Anesthesia Preprocedure Evaluation (Signed)
Anesthesia Evaluation  Patient identified by MRN, date of birth, ID band Patient awake    Reviewed: Allergy & Precautions, H&P , NPO status , Patient's Chart, lab work & pertinent test results, reviewed documented beta blocker date and time   History of Anesthesia Complications Negative for: history of anesthetic complications  Airway Mallampati: II  TM Distance: >3 FB Neck ROM: full    Dental  (+) Partial Lower, Partial Upper   Pulmonary neg sleep apnea, neg COPD, Current Smoker and Patient abstained from smoking.,    Pulmonary exam normal breath sounds clear to auscultation       Cardiovascular Exercise Tolerance: Poor METS: 3 - Mets hypertension, Pt. on medications and Pt. on home beta blockers + CAD and + CABG  (-) Past MI Normal cardiovascular exam(-) dysrhythmias + Valvular Problems/Murmurs  Rhythm:regular Rate:Normal - Systolic murmurs TTE 1610: NORMAL LEFT VENTRICULAR SYSTOLIC FUNCTION  WITH MILD LVH  NORMAL RIGHT VENTRICULAR SYSTOLIC FUNCTION  MILD VALVULAR REGURGITATION MR, TR  NO VALVULAR STENOSIS  AVA(VTI)=1.29cm^2  MILD MR, tR  EF >55%     Neuro/Psych negative neurological ROS  negative psych ROS   GI/Hepatic Neg liver ROS, GERD  Controlled,  Endo/Other  diabetes, Well Controlled, Type 2, Oral Hypoglycemic AgentsMorbid obesity  Renal/GU Renal InsufficiencyRenal disease  negative genitourinary   Musculoskeletal  (+) Arthritis ,   Abdominal   Peds  Hematology negative hematology ROS (+)   Anesthesia Other Findings Past Medical History: No date: Diabetes mellitus without complication ([place]) No date: Hypertension Past Surgical History: No date: ABDOMINAL HYSTERECTOMY 20+ yrs ago: BREAST BIOPSY; Right     Comment:  EXCISIONAL No date: CARDIAC SURGERY     Comment:  cabg No date: CORONARY ARTERY BYPASS GRAFT     Comment:  1996 BMI    Body Mass Index:  32.77 kg/m      Reproductive/Obstetrics negative OB ROS                             Anesthesia Physical  Anesthesia Plan  ASA: 3  Anesthesia Plan: General   Post-op Pain Management: Ofirmev IV (intra-op) and Dilaudid IV   Induction: Intravenous  PONV Risk Score and Plan: 3 and Ondansetron, Dexamethasone, Propofol infusion and TIVA  Airway Management Planned: Oral ETT  Additional Equipment: None  Intra-op Plan:   Post-operative Plan: Extubation in OR  Informed Consent: I have reviewed the patients History and Physical, chart, labs and discussed the procedure including the risks, benefits and alternatives for the proposed anesthesia with the patient or authorized representative who has indicated his/her understanding and acceptance.     Dental Advisory Given  Plan Discussed with: CRNA  Anesthesia Plan Comments: (Discussed risks of anesthesia with patient, including PONV, sore throat, lip/dental/eye damage. Rare risks discussed as well, such as cardiorespiratory and neurological sequelae, and allergic reactions. Discussed the role of CRNA in patient's perioperative care. Patient understands. Patient counseled on benefits of smoking cessation, and increased perioperative risks associated with continued smoking. )        Anesthesia Quick Evaluation

## 2021-06-25 NOTE — Op Note (Signed)
Indications: [name] is a 71 yo female who presented with neurogenic claudication.  She failed conservative management.  Findings: lumbar stenosis  Preoperative Diagnosis: Lumbar Stenosis with neurogenic claudication Postoperative Diagnosis: same   EBL: 10 ml IVF: 500 ml Drains: none Disposition: Extubated and Stable to PACU Complications: none  No foley catheter was placed.   Preoperative Note:   Risks of surgery discussed include: infection, bleeding, stroke, coma, death, paralysis, CSF leak, nerve/spinal cord injury, numbness, tingling, weakness, complex regional pain syndrome, recurrent stenosis and/or disc herniation, vascular injury, development of instability, neck/back pain, need for further surgery, persistent symptoms, development of deformity, and the risks of anesthesia. The patient understood these risks and agreed to proceed.  Operative Note:   1. L2-3 lumbar decompression including central laminectomy and bilateral medial facetectomies including foraminotomies  The patient was then brought from the preoperative center with intravenous access established.  The patient underwent general anesthesia and endotracheal tube intubation, and was then rotated on the [place] rail top where all pressure points were appropriately padded.  The skin was then thoroughly cleansed.  Perioperative antibiotic prophylaxis was administered.  Sterile prep and drapes were then applied and a timeout was then observed.  C-arm was brought into the field under sterile conditions and under lateral visualization the L2-3 interspace was identified and marked.  The incision was marked and injected with local anesthetic. Once this was complete a 3 cm incision was opened with the use of a #10 blade knife.    The metrx tubes were sequentially advanced and confirmed in position at L2-3. An 12mm by 36mm tube was locked in place to the bed side attachment.  The microscope was then sterilely brought into the  field and muscle creep was hemostased with a bipolar and resected with a pituitary rongeur.  A Bovie extender was then used to expose the spinous process and lamina.  Careful attention was placed to not violate the facet capsule. A 3 mm matchstick drill bit was then used to make a hemi-laminotomy trough until the ligamentum flavum was exposed.  This was extended to the base of the spinous process and to the contralateral side to remove all the central bone from each side.  Once this was complete and the underlying ligamentum flavum was visualized, it was dissected with a curette and resected with Kerrison rongeurs.  Extensive ligamentum hypertrophy was noted, requiring a substantial amount of time and care for removal.  The dura was identified and palpated. The kerrison rongeur was then used to remove the medial facet bilaterally until no compression was noted.  A balltip probe was used to confirm decompression of the ipsilateral L3 nerve root.  Additional attention was paid to completion of the contralateral L2-3 foraminotomy until the contralateral traversing nerve root was completely free.  Once this was complete, L2-3 central decompression including medial facetectomy and foraminotomy was confirmed and decompression on both sides was confirmed. No CSF leak was noted.  A Depo-Medrol soaked Gelfoam pledget was placed in the defect.  The wound was copiously irrigated. The tube system was then removed under microscopic visualization and hemostasis was obtained with a bipolar.    The fascial layer was reapproximated with the use of a 0 Vicryl suture.  Subcutaneous tissue layer was reapproximated using 2-0 Vicryl suture.  3-0 monocryl was placed in subcuticular fashion. The skin was then cleansed and Dermabond was used to close the skin opening.  Patient was then rotated back to the preoperative bed awakened from anesthesia and  taken to recovery all counts are correct in this case.  I performed the entire  procedure with the assistance of Cooper Render PA as an Pensions consultant.  Promise Bushong K. Izora Ribas MD

## 2021-06-25 NOTE — Discharge Instructions (Addendum)
Your surgeon has performed an operation on your lumbar spine (low back) to relieve pressure on one or more nerves. Many times, patients feel better immediately after surgery and can overdo it. Even if you feel well, it is important that you follow these activity guidelines. If you do not let your back heal properly from the surgery, you can increase the chance of a disc herniation and/or return of your symptoms. The following are instructions to help in your recovery once you have been discharged from the hospital.  * Do not take anti-inflammatory medications for 3 days after surgery (naproxen [Aleve], ibuprofen [Advil, Motrin], celecoxib [Celebrex], etc.) *Hold home Aspirin for 7 days post-op   Activity    No bending, lifting, or twisting (BLT). Avoid lifting objects heavier than 10 pounds (gallon milk jug).  Where possible, avoid household activities that involve lifting, bending, pushing, or pulling such as laundry, vacuuming, grocery shopping, and childcare. Try to arrange for help from friends and family for these activities while your back heals.  Increase physical activity slowly as tolerated.  Taking short walks is encouraged, but avoid strenuous exercise. Do not jog, run, bicycle, lift weights, or participate in any other exercises unless specifically allowed by your doctor. Avoid prolonged sitting, including car rides.  Talk to your doctor before resuming sexual activity.  You should not drive until cleared by your doctor.  Until released by your doctor, you should not return to work or school.  You should rest at home and let your body heal.   You may shower two days after your surgery.  After showering, lightly dab your incision dry. Do not take a tub bath or go swimming for 3 weeks, or until approved by your doctor at your follow-up appointment.  If you smoke, we strongly recommend that you quit.  Smoking has been proven to interfere with normal healing in your back and will  dramatically reduce the success rate of your surgery. Please contact [facility] (800-QUIT-NOW) and use the resources at www.[facility].com for assistance in stopping smoking.  Surgical Incision   If you have a dressing on your incision, you may remove it two days after your surgery. Keep your incision area clean and dry.  Your incision was closed with Dermabond glue. The glue should begin to peel away within about a week.  Diet            You may return to your usual diet. Be sure to stay hydrated.  When to Contact us  Although your surgery and recovery will likely be uneventful, you may have some residual numbness, aches, and pains in your back and/or legs. This is normal and should improve in the next few weeks.  However, should you experience any of the following, contact us immediately: New numbness or weakness Pain that is progressively getting worse, and is not relieved by your pain medications or rest Bleeding, redness, swelling, pain, or drainage from surgical incision Chills or flu-like symptoms Fever greater than 101.0 F (38.3 C) Problems with bowel or bladder functions Difficulty breathing or shortness of breath Warmth, tenderness, or swelling in your calf  Contact Information During office hours (Monday-Friday 9 am to 5 pm), please call your physician at [phone] After hours and weekends, please call [phone] and speak with the answering service, who will contact the doctor on call.  If that fails, call the [facility] Operator at [phone] and ask for the Neurosurgery Resident On Call  For a life-threatening emergency, call [place]  DISCHARGE INSTRUCTIONS   The drugs that you were given will stay in your system until tomorrow so for the next 24 hours you should not:  Drive an automobile Make any legal decisions Drink any alcoholic beverage   You may resume regular meals tomorrow.  Today it is better to start with liquids and gradually work up  to solid foods.  You may eat anything you prefer, but it is better to start with liquids, then soup and crackers, and gradually work up to solid foods.   Please notify your doctor immediately if you have any unusual bleeding, trouble breathing, redness and pain at the surgery site, drainage, fever, or pain not relieved by medication.    Additional Instructions:    AMBULATORY SURGERY  DISCHARGE INSTRUCTIONS   The drugs that you were given will stay in your system until tomorrow so for the next 24 hours you should not:  Drive an automobile Make any legal decisions Drink any alcoholic beverage   You may resume regular meals tomorrow.  Today it is better to start with liquids and gradually work up to solid foods.  You may eat anything you prefer, but it is better to start with liquids, then soup and crackers, and gradually work up to solid foods.   Please notify your doctor immediately if you have any unusual bleeding, trouble breathing, redness and pain at the surgery site, drainage, fever, or pain not relieved by medication.    Additional Instructions:   AMBULATORY SURGERY  DISCHARGE INSTRUCTIONS   The drugs that you were given will stay in your system until tomorrow so for the next 24 hours you should not:  Drive an automobile Make any legal decisions Drink any alcoholic beverage   You may resume regular meals tomorrow.  Today it is better to start with liquids and gradually work up to solid foods.  You may eat anything you prefer, but it is better to start with liquids, then soup and crackers, and gradually work up to solid foods.   Please notify your doctor immediately if you have any unusual bleeding, trouble breathing, redness and pain at the surgery site, drainage, fever, or pain not relieved by medication.    Additional Instructions:    AMBULATORY SURGERY  DISCHARGE INSTRUCTIONS   The drugs that you were given will stay in your system until  tomorrow so for the next 24 hours you should not:  Drive an automobile Make any legal decisions Drink any alcoholic beverage   You may resume regular meals tomorrow.  Today it is better to start with liquids and gradually work up to solid foods.  You may eat anything you prefer, but it is better to start with liquids, then soup and crackers, and gradually work up to solid foods.   Please notify your doctor immediately if you have any unusual bleeding, trouble breathing, redness and pain at the surgery site, drainage, fever, or pain not relieved by medication.    Additional Instructions:        Please contact your physician with any problems or Same Day Surgery at [phone], Monday through Friday 6 am to 4 pm, or [place] at [facility] number at [phone]. Please contact your physician with any problems or Same Day Surgery at [phone], Monday through Friday 6 am to 4 pm, or [place] at [facility] number at [phone]. lease contact your physician with any problems or Same Day Surgery at [phone], Monday through Friday 6 am to 4 pm, or  [place] at [facility] number at [phone]. Please contact your physician with any problems or Same Day Surgery at [phone], Monday through Friday 6 am to 4 pm, or [place] at [facility] number at [phone].

## 2021-06-25 NOTE — H&P (Signed)
History of Present Illness: 06/25/2021 [name] presents today with continued leg pain.    05/15/2021 [name] is here today with a chief complaint of low back and right leg pain along with concern for Schwannoma.  She has been having trouble for the past couple of years. She has trouble with standing and walking. She can only walk 5 to 10 minutes without significant discomfort. She then has to sit down to help her pain. She is unable to participate in as much activity as she would like. Bowel/Bladder Dysfunction: none  Conservative measures:  Physical therapy: has tried Multimodal medical therapy including regular antiinflammatories: cymbalta, gabapentin, hydrocodone  Injections: has had epidural steroid injections 04/28/21: Bilateral L3-4 ESI   Past Surgery: none  Renee [name] has no symptoms of cervical myelopathy.  The symptoms are causing a significant impact on the patient's life.   Review of Systems:  A 10 point review of systems is negative, except for the pertinent positives and negatives detailed in the HPI.  Past Medical History: Past Medical History:  Diagnosis Date   Angina pectoris (CMS-HCC)   Arthritis   CAD (coronary artery disease)  status post CABG one vessel bypass in 1996. No coronary stent placement.   Diabetes mellitus type 2, uncomplicated (CMS-HCC)   DJD (degenerative joint disease)   Heart murmur, unspecified   Hypercholesterolemia   Hypertension   Neuropathy   Smoking   Syncope   Past Surgical History: Past Surgical History:  Procedure Laterality Date   CABG with PCI and stent   Carpal tunnel surgery Bilateral   COLONOSCOPY 02/24/2017  Tubular adenoma of colon/Repeat 91yrs/TKT   CORONARY ARTERY BYPASS GRAFT 1996  one vessel bypass   HERNIA REPAIR  umbilical   HYSTERECTOMY  partial   Right breast biopsy   Total knee arthroplasty-diabetic Left 10/25/2018  [facility]    Allergies  Allergen Reactions   Codeine Nausea And Vomiting    Tramadol Nausea And Vomiting    Current Meds  Medication Sig   Alpha-Lipoic Acid 600 MG CAPS Take 600 mg by mouth daily.   amLODipine (NORVASC) 5 MG tablet Take 5 mg by mouth daily.   aspirin EC 81 MG tablet Take 81 mg by mouth daily.   Azelastine HCl 137 MCG/SPRAY SOLN Place 1 spray into the nose 2 (two) times daily as needed (allergies).   carvedilol (COREG) 6.25 MG tablet Take 6.25 mg by mouth 2 (two) times a day.    Cholecalciferol (VITAMIN D3) 50 MCG (2000 UT) TABS Take 2,000 Units by mouth daily.   CINNAMON PO Take 1,000 mg by mouth daily.   DULoxetine (CYMBALTA) 30 MG capsule Take 90 mg by mouth daily.   Ferrous Sulfate (SLOW RELEASE IRON PO) Take 1 tablet by mouth once a week.   furosemide (LASIX) 20 MG tablet Take 20 mg by mouth once a week. In the morning   gabapentin (NEURONTIN) 800 MG tablet Take 800-1,600 mg by mouth See admin instructions. Take 1 tablet (800 mg) by mouth in the morning, 1 tablet (800 mg) by mouth at noon, & take 2 tablets (1600 mg) by mouth at bedtime.   hydrochlorothiazide (HYDRODIURIL) 25 MG tablet Take 25 mg by mouth daily.   HYDROcodone-acetaminophen (NORCO/VICODIN) 5-325 MG tablet Take 1-2 tablets by mouth every 4 (four) hours as needed for moderate pain (pain score 4-6).   loratadine (CLARITIN) 10 MG tablet Take 10 mg by mouth daily as needed for allergies.   metFORMIN (GLUCOPHAGE-XR) 500 MG 24 hr tablet  Take 500 mg by mouth daily with breakfast.   Omega-3 Fatty Acids (FISH OIL) 1000 MG CAPS Take 1,000 mg by mouth 3 (three) times daily.   potassium chloride (KLOR-CON) 10 MEQ tablet Take 10 mEq by mouth daily.   Propylene Glycol (SYSTANE BALANCE) 0.6 % SOLN Place 1 drop into both eyes daily as needed (dry eyes).   rosuvastatin (CRESTOR) 20 MG tablet Take 20 mg by mouth at bedtime.   vitamin C (ASCORBIC ACID) 500 MG tablet Take 500 mg by mouth daily.    Social History: Social History   Tobacco Use   Smoking status: Every Day  Packs/day: 0.50  Types:  Cigarettes   Smokeless tobacco: Never  Vaping Use   Vaping Use: Never used  Substance Use Topics   Alcohol use: No   Drug use: No   Family Medical History: Family History  Problem Relation Age of Onset   Colon polyps Sister   Arthritis Sister   Colon polyps Sister   Arthritis Sister   Arthritis Mother   Arthritis Father   Physical Examination:  Vitals:   06/17/21 0908 06/25/21 0730  BP: 121/73 (!) 152/80  Pulse: 70 70  Resp: 18 18  Temp: 98.1 F (36.7 C) (!) 96.9 F (36.1 C)  SpO2: 100% 99%   Heart sounds normal no MRG. Chest Clear to Auscultation Bilaterally.   General: Patient is well developed, well nourished, calm, collected, and in no apparent distress. Attention to examination is appropriate.  Psychiatric: Patient is non-anxious.  Head: Pupils equal, round, and reactive to light.  ENT: Oral mucosa appears well hydrated.  Neck: Supple. Full range of motion.  Respiratory: Patient is breathing without any difficulty.  Extremities: No edema.  Vascular: Palpable dorsal pedal pulses.  Skin: On exposed skin, there are no abnormal skin lesions.  NEUROLOGICAL:   Awake, alert, oriented to person, place, and time. Speech is clear and fluent. Fund of knowledge is appropriate.   Cranial Nerves: Pupils equal round and reactive to light. Facial tone is symmetric. Facial sensation is symmetric. Shoulder shrug is symmetric. Tongue protrusion is midline. There is no pronator drift.  Strength: Side Biceps Triceps Deltoid Interossei Grip Wrist Ext. Wrist Flex.  R 5 5 5 5 5 5 5   L 5 5 5 5 5 5 5    Side Iliopsoas Quads Hamstring PF DF EHL  R 5 5 5 5 5 5   L 5 5 5 5 5 5    Reflexes are 1+ and symmetric at the biceps, triceps, brachioradialis, patella and achilles. Hoffman's is absent.  Clonus is not present. Toes are down-going.  Bilateral upper and lower extremity sensation is intact to light touch.  Gait is antalgic.  No evidence of dysmetria noted.  Medical  Decision Making  Imaging: MRI L spine 04/09/2021 with contrast IMPRESSION:  Enhancing intradural mass lesion on the right L1-2 is confirmed on  contrast enhanced images. This shows incomplete enhancement and is  displacing nerve roots to the left. This is most likely a  schwannoma. Intradural meningioma not considered likely.   Severe spinal stenosis L2-3   Electronically Signed    By: Franchot Gallo M.D.    On: 04/09/2021 20:07 MRI L spine 04/01/21 without  IMPRESSION:  1. Generalized lumbar spine degeneration with mild scoliosis and  L2-3, L3-4 anterolisthesis.  2. Compressive spinal stenosis at L1-2 and especially L2-3.  3. Facet ankylosis at L3-4 and L4-5.  4. Equivocal for 5 mm cauda equina nodule at the level of L1-2.  At  follow-up, would recommend contrast.   Electronically Signed    By: Jorje Guild M.D.    On: 04/03/2021 06:11  I have personally reviewed the images and agree with the above interpretation.  Assessment and Plan: Ms. Mione is a pleasant 71 y.o. 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 She has tried and failed physical therapy for her neurogenic claudication.  We will proceed with L2-3 decompression.  Meade Maw MD, [facility] Department of Neurosurgery

## 2021-06-25 NOTE — Anesthesia Procedure Notes (Signed)
Procedure Name: Intubation Date/Time: 06/25/2021 8:30 AM Performed by: Aline Brochure, CRNA Pre-anesthesia Checklist: Patient identified, Patient being monitored, Timeout performed, Emergency Drugs available and Suction available Patient Re-evaluated:Patient Re-evaluated prior to induction Oxygen Delivery Method: Circle system utilized Preoxygenation: Pre-oxygenation with 100% oxygen Induction Type: IV induction Ventilation: Mask ventilation without difficulty Laryngoscope Size: Miller and 2 Grade View: Grade I Tube type: Oral Tube size: 7.0 mm Number of attempts: 1 Airway Equipment and Method: Stylet Placement Confirmation: ETT inserted through vocal cords under direct vision, positive ETCO2 and breath sounds checked- equal and bilateral Secured at: 22 cm Tube secured with: Tape Dental Injury: Teeth and Oropharynx as per pre-operative assessment

## 2021-06-25 NOTE — Addendum Note (Signed)
Addendum  created 06/25/21 1218 by Aline Brochure, CRNA   Charge Capture section accepted

## 2021-06-25 NOTE — Anesthesia Postprocedure Evaluation (Signed)
Anesthesia Post Note  Patient: Renee [name]  Procedure(s) Performed: L2-3 DECOMPRESSION (Spine Lumbar)  Patient location during evaluation: PACU Anesthesia Type: General Level of consciousness: awake and alert Pain management: pain level controlled Vital Signs Assessment: post-procedure vital signs reviewed and stable Respiratory status: spontaneous breathing, nonlabored ventilation, respiratory function stable and patient connected to nasal cannula oxygen Cardiovascular status: blood pressure returned to baseline and stable Postop Assessment: no apparent nausea or vomiting Anesthetic complications: no   No notable events documented.   Last Vitals:  Vitals:   06/25/21 1045 06/25/21 1050  BP:  112/71  Pulse: 72 69  Resp: 16 12  Temp:    SpO2: 97% 98%    Last Pain:  Vitals:   06/25/21 1040  TempSrc:   PainSc: 0-No pain                 Arita Miss

## 2021-06-25 NOTE — Discharge Summary (Signed)
Physician Discharge Summary  Patient ID: Renee [name] MRN: [id number] DOB/AGE: 1951-01-04 71 y.o.  Admit date: 06/25/2021 Discharge date: 06/25/2021  Admission Diagnoses: Lumbar stenosis   Discharge Diagnoses:  Active Problems:   * No active hospital problems. *   Discharged Condition: good  Hospital Course:  Renee [name] is a 71 y.o s/p L2-3 decompression. Her interoperative course was uncomplicated. She was monitored post-op in the PACU and discharged home after ambulating, urinating, and tolerating PO intake. She was given prescriptions for Zanaflex, Oxycodone, and Senna.   Consults: None  Significant Diagnostic Studies: none  Treatments: surgery: as above. Please see separately dictated operative report for further details.  Discharge Exam: Blood pressure (!) 152/80, pulse 70, temperature (!) 96.9 F (36.1 C), resp. rate 18, height 5\' 3"  (1.6 m), weight 83.8 kg, SpO2 99 %. CN II-XII grossly intact 5/5 strength in BLE  Disposition: Discharge disposition: 01-Home or Self Care        Allergies as of 06/25/2021       Reactions   Codeine Nausea And Vomiting   Tramadol Nausea And Vomiting        Medication List     STOP taking these medications    aspirin EC 81 MG tablet   HYDROcodone-acetaminophen 5-325 MG tablet Commonly known as: NORCO/VICODIN       TAKE these medications    Alpha-Lipoic Acid 600 MG Caps Take 600 mg by mouth daily.   amLODipine 5 MG tablet Commonly known as: NORVASC Take 5 mg by mouth daily.   Azelastine HCl 137 MCG/SPRAY Soln Place 1 spray into the nose 2 (two) times daily as needed (allergies).   carvedilol 6.25 MG tablet Commonly known as: COREG Take 6.25 mg by mouth 2 (two) times a day.   CINNAMON PO Take 1,000 mg by mouth daily.   DULoxetine 30 MG capsule Commonly known as: CYMBALTA Take 90 mg by mouth daily.   Fish Oil 1000 MG Caps Take 1,000 mg by mouth 3 (three) times daily.   furosemide 20 MG  tablet Commonly known as: LASIX Take 20 mg by mouth once a week. In the morning   gabapentin 800 MG tablet Commonly known as: NEURONTIN Take 800-1,600 mg by mouth See admin instructions. Take 1 tablet (800 mg) by mouth in the morning, 1 tablet (800 mg) by mouth at noon, & take 2 tablets (1600 mg) by mouth at bedtime.   hydrochlorothiazide 25 MG tablet Commonly known as: HYDRODIURIL Take 25 mg by mouth daily.   loratadine 10 MG tablet Commonly known as: CLARITIN Take 10 mg by mouth daily as needed for allergies.   metFORMIN 500 MG 24 hr tablet Commonly known as: GLUCOPHAGE-XR Take 500 mg by mouth daily with breakfast.   nitroGLYCERIN 0.4 MG SL tablet Commonly known as: NITROSTAT Place 0.4 mg under the tongue every 5 (five) minutes as needed for chest pain.   oxyCODONE 5 MG immediate release tablet Commonly known as: Roxicodone Take 1 tablet (5 mg total) by mouth every 4 (four) hours as needed for up to 5 days for severe pain.   potassium chloride 10 MEQ tablet Commonly known as: KLOR-CON Take 10 mEq by mouth daily.   rosuvastatin 20 MG tablet Commonly known as: CRESTOR Take 20 mg by mouth at bedtime.   senna 8.6 MG Tabs tablet Commonly known as: SENOKOT Take 1 tablet (8.6 mg total) by mouth daily as needed for mild constipation.   SLOW RELEASE IRON PO Take 1 tablet by mouth once  a week.   Systane Balance 0.6 % Soln Generic drug: Propylene Glycol Place 1 drop into both eyes daily as needed (dry eyes).   tiZANidine 2 MG tablet Commonly known as: ZANAFLEX Take 1 tablet (2 mg total) by mouth 3 (three) times daily.   vitamin C 500 MG tablet Commonly known as: ASCORBIC ACID Take 500 mg by mouth daily.   Vitamin D3 50 MCG (2000 UT) Tabs Take 2,000 Units by mouth daily.        Follow-up Information     Loleta Dicker, PA Follow up in 2 week(s).   Why: for incision check Contact information: [place] [address] [phone]                  Signed: Loleta Dicker 06/25/2021, 10:06 AM

## 2021-06-26 ENCOUNTER — Encounter: Payer: Self-pay | Admitting: Neurosurgery

## 2021-06-27 DIAGNOSIS — I2510 Atherosclerotic heart disease of native coronary artery without angina pectoris: Secondary | ICD-10-CM | POA: Diagnosis not present

## 2021-06-27 DIAGNOSIS — E1142 Type 2 diabetes mellitus with diabetic polyneuropathy: Secondary | ICD-10-CM | POA: Diagnosis not present

## 2021-06-27 DIAGNOSIS — G63 Polyneuropathy in diseases classified elsewhere: Secondary | ICD-10-CM | POA: Diagnosis not present

## 2021-06-27 DIAGNOSIS — M545 Low back pain, unspecified: Secondary | ICD-10-CM | POA: Diagnosis not present

## 2021-06-27 DIAGNOSIS — G8929 Other chronic pain: Secondary | ICD-10-CM | POA: Diagnosis not present

## 2021-06-27 DIAGNOSIS — E7800 Pure hypercholesterolemia, unspecified: Secondary | ICD-10-CM | POA: Diagnosis not present

## 2021-06-27 DIAGNOSIS — M1712 Unilateral primary osteoarthritis, left knee: Secondary | ICD-10-CM | POA: Diagnosis not present

## 2021-07-11 DIAGNOSIS — M48062 Spinal stenosis, lumbar region with neurogenic claudication: Secondary | ICD-10-CM | POA: Diagnosis not present

## 2021-07-11 DIAGNOSIS — R7989 Other specified abnormal findings of blood chemistry: Secondary | ICD-10-CM | POA: Diagnosis not present

## 2021-07-11 DIAGNOSIS — Z0000 Encounter for general adult medical examination without abnormal findings: Secondary | ICD-10-CM | POA: Diagnosis not present

## 2021-07-11 DIAGNOSIS — B351 Tinea unguium: Secondary | ICD-10-CM | POA: Diagnosis not present

## 2021-07-11 DIAGNOSIS — G63 Polyneuropathy in diseases classified elsewhere: Secondary | ICD-10-CM | POA: Diagnosis not present

## 2021-07-11 DIAGNOSIS — E1142 Type 2 diabetes mellitus with diabetic polyneuropathy: Secondary | ICD-10-CM | POA: Diagnosis not present

## 2021-07-11 DIAGNOSIS — I10 Essential (primary) hypertension: Secondary | ICD-10-CM | POA: Diagnosis not present

## 2021-07-11 DIAGNOSIS — E7800 Pure hypercholesterolemia, unspecified: Secondary | ICD-10-CM | POA: Diagnosis not present

## 2021-07-11 DIAGNOSIS — N1831 Chronic kidney disease, stage 3a: Secondary | ICD-10-CM | POA: Diagnosis not present

## 2021-07-11 DIAGNOSIS — E1169 Type 2 diabetes mellitus with other specified complication: Secondary | ICD-10-CM | POA: Diagnosis not present

## 2021-07-11 DIAGNOSIS — D649 Anemia, unspecified: Secondary | ICD-10-CM | POA: Diagnosis not present

## 2021-08-27 DIAGNOSIS — E1142 Type 2 diabetes mellitus with diabetic polyneuropathy: Secondary | ICD-10-CM | POA: Diagnosis not present

## 2021-08-27 DIAGNOSIS — I10 Essential (primary) hypertension: Secondary | ICD-10-CM | POA: Diagnosis not present

## 2021-08-27 DIAGNOSIS — R011 Cardiac murmur, unspecified: Secondary | ICD-10-CM | POA: Diagnosis not present

## 2021-09-05 DIAGNOSIS — E1142 Type 2 diabetes mellitus with diabetic polyneuropathy: Secondary | ICD-10-CM | POA: Diagnosis not present

## 2021-09-05 DIAGNOSIS — I10 Essential (primary) hypertension: Secondary | ICD-10-CM | POA: Diagnosis not present

## 2021-09-05 DIAGNOSIS — H9201 Otalgia, right ear: Secondary | ICD-10-CM | POA: Diagnosis not present

## 2021-09-10 DIAGNOSIS — J301 Allergic rhinitis due to pollen: Secondary | ICD-10-CM | POA: Diagnosis not present

## 2021-09-10 DIAGNOSIS — H60391 Other infective otitis externa, right ear: Secondary | ICD-10-CM | POA: Diagnosis not present

## 2021-09-11 DIAGNOSIS — G8929 Other chronic pain: Secondary | ICD-10-CM | POA: Diagnosis not present

## 2021-09-11 DIAGNOSIS — Z87448 Personal history of other diseases of urinary system: Secondary | ICD-10-CM | POA: Diagnosis not present

## 2021-09-11 DIAGNOSIS — G629 Polyneuropathy, unspecified: Secondary | ICD-10-CM | POA: Diagnosis not present

## 2021-09-11 DIAGNOSIS — M545 Low back pain, unspecified: Secondary | ICD-10-CM | POA: Diagnosis not present

## 2021-09-11 DIAGNOSIS — H9203 Otalgia, bilateral: Secondary | ICD-10-CM | POA: Diagnosis not present

## 2021-09-11 DIAGNOSIS — E1142 Type 2 diabetes mellitus with diabetic polyneuropathy: Secondary | ICD-10-CM | POA: Diagnosis not present

## 2021-09-11 DIAGNOSIS — R011 Cardiac murmur, unspecified: Secondary | ICD-10-CM | POA: Diagnosis not present

## 2021-10-02 DIAGNOSIS — M48062 Spinal stenosis, lumbar region with neurogenic claudication: Secondary | ICD-10-CM | POA: Diagnosis not present

## 2021-10-02 DIAGNOSIS — R700 Elevated erythrocyte sedimentation rate: Secondary | ICD-10-CM | POA: Diagnosis not present

## 2021-10-20 DIAGNOSIS — E1142 Type 2 diabetes mellitus with diabetic polyneuropathy: Secondary | ICD-10-CM | POA: Diagnosis not present

## 2021-10-20 DIAGNOSIS — D649 Anemia, unspecified: Secondary | ICD-10-CM | POA: Diagnosis not present

## 2021-10-27 DIAGNOSIS — G4452 New daily persistent headache (NDPH): Secondary | ICD-10-CM | POA: Diagnosis not present

## 2021-10-27 DIAGNOSIS — R700 Elevated erythrocyte sedimentation rate: Secondary | ICD-10-CM | POA: Diagnosis not present

## 2021-10-27 DIAGNOSIS — I2510 Atherosclerotic heart disease of native coronary artery without angina pectoris: Secondary | ICD-10-CM | POA: Diagnosis not present

## 2021-10-27 DIAGNOSIS — D649 Anemia, unspecified: Secondary | ICD-10-CM | POA: Diagnosis not present

## 2021-10-27 DIAGNOSIS — E1169 Type 2 diabetes mellitus with other specified complication: Secondary | ICD-10-CM | POA: Diagnosis not present

## 2021-10-27 DIAGNOSIS — E1142 Type 2 diabetes mellitus with diabetic polyneuropathy: Secondary | ICD-10-CM | POA: Diagnosis not present

## 2021-10-27 DIAGNOSIS — G63 Polyneuropathy in diseases classified elsewhere: Secondary | ICD-10-CM | POA: Diagnosis not present

## 2021-10-27 DIAGNOSIS — M48062 Spinal stenosis, lumbar region with neurogenic claudication: Secondary | ICD-10-CM | POA: Diagnosis not present

## 2021-10-27 DIAGNOSIS — E7800 Pure hypercholesterolemia, unspecified: Secondary | ICD-10-CM | POA: Diagnosis not present

## 2021-10-27 DIAGNOSIS — B351 Tinea unguium: Secondary | ICD-10-CM | POA: Diagnosis not present

## 2021-10-27 DIAGNOSIS — I10 Essential (primary) hypertension: Secondary | ICD-10-CM | POA: Diagnosis not present

## 2021-10-27 DIAGNOSIS — D7589 Other specified diseases of blood and blood-forming organs: Secondary | ICD-10-CM | POA: Diagnosis not present

## 2021-10-29 ENCOUNTER — Other Ambulatory Visit: Payer: Self-pay | Admitting: Internal Medicine

## 2021-10-29 DIAGNOSIS — G44229 Chronic tension-type headache, not intractable: Secondary | ICD-10-CM

## 2021-10-31 ENCOUNTER — Ambulatory Visit
Admission: RE | Admit: 2021-10-31 | Discharge: 2021-10-31 | Disposition: A | Discharge status: Home / Self Care | Payer: No Typology Code available for payment source | Source: Ambulatory Visit | Attending: Internal Medicine | Admitting: Internal Medicine

## 2021-10-31 DIAGNOSIS — R519 Headache, unspecified: Secondary | ICD-10-CM | POA: Diagnosis not present

## 2021-10-31 DIAGNOSIS — G44229 Chronic tension-type headache, not intractable: Secondary | ICD-10-CM

## 2021-11-11 DIAGNOSIS — J188 Other pneumonia, unspecified organism: Secondary | ICD-10-CM | POA: Diagnosis not present

## 2021-11-11 DIAGNOSIS — I10 Essential (primary) hypertension: Secondary | ICD-10-CM | POA: Diagnosis not present

## 2021-11-11 DIAGNOSIS — E1142 Type 2 diabetes mellitus with diabetic polyneuropathy: Secondary | ICD-10-CM | POA: Diagnosis not present

## 2021-11-17 DIAGNOSIS — J188 Other pneumonia, unspecified organism: Secondary | ICD-10-CM | POA: Diagnosis not present

## 2021-11-17 DIAGNOSIS — I10 Essential (primary) hypertension: Secondary | ICD-10-CM | POA: Diagnosis not present

## 2021-12-15 ENCOUNTER — Other Ambulatory Visit: Payer: Self-pay

## 2021-12-26 DIAGNOSIS — H26491 Other secondary cataract, right eye: Secondary | ICD-10-CM | POA: Diagnosis not present

## 2021-12-26 DIAGNOSIS — E113293 Type 2 diabetes mellitus with mild nonproliferative diabetic retinopathy without macular edema, bilateral: Secondary | ICD-10-CM | POA: Diagnosis not present

## 2021-12-26 DIAGNOSIS — Z961 Presence of intraocular lens: Secondary | ICD-10-CM | POA: Diagnosis not present

## 2022-01-07 ENCOUNTER — Other Ambulatory Visit: Payer: Self-pay | Admitting: Internal Medicine

## 2022-01-07 DIAGNOSIS — Z1231 Encounter for screening mammogram for malignant neoplasm of breast: Secondary | ICD-10-CM

## 2022-01-07 DIAGNOSIS — M48062 Spinal stenosis, lumbar region with neurogenic claudication: Secondary | ICD-10-CM | POA: Diagnosis not present

## 2022-02-06 ENCOUNTER — Ambulatory Visit
Admission: RE | Admit: 2022-02-06 | Discharge: 2022-02-06 | Disposition: A | Discharge status: Home / Self Care | Payer: No Typology Code available for payment source | Source: Ambulatory Visit | Attending: Internal Medicine | Admitting: Internal Medicine

## 2022-02-06 DIAGNOSIS — Z1231 Encounter for screening mammogram for malignant neoplasm of breast: Secondary | ICD-10-CM | POA: Diagnosis not present

## 2022-02-07 ENCOUNTER — Other Ambulatory Visit: Payer: Self-pay | Admitting: Neurosurgery

## 2022-02-09 ENCOUNTER — Other Ambulatory Visit: Payer: Self-pay | Admitting: Neurosurgery

## 2022-02-24 DIAGNOSIS — E1142 Type 2 diabetes mellitus with diabetic polyneuropathy: Secondary | ICD-10-CM | POA: Diagnosis not present

## 2022-02-24 DIAGNOSIS — I2510 Atherosclerotic heart disease of native coronary artery without angina pectoris: Secondary | ICD-10-CM | POA: Diagnosis not present

## 2022-02-24 DIAGNOSIS — G629 Polyneuropathy, unspecified: Secondary | ICD-10-CM | POA: Diagnosis not present

## 2022-02-24 DIAGNOSIS — R011 Cardiac murmur, unspecified: Secondary | ICD-10-CM | POA: Diagnosis not present

## 2022-02-24 DIAGNOSIS — I10 Essential (primary) hypertension: Secondary | ICD-10-CM | POA: Diagnosis not present

## 2022-02-24 DIAGNOSIS — R0602 Shortness of breath: Secondary | ICD-10-CM | POA: Diagnosis not present

## 2022-02-24 DIAGNOSIS — R55 Syncope and collapse: Secondary | ICD-10-CM | POA: Diagnosis not present

## 2022-02-24 DIAGNOSIS — F17200 Nicotine dependence, unspecified, uncomplicated: Secondary | ICD-10-CM | POA: Diagnosis not present

## 2022-02-24 DIAGNOSIS — N183 Chronic kidney disease, stage 3 unspecified: Secondary | ICD-10-CM | POA: Diagnosis not present

## 2022-03-10 DIAGNOSIS — R011 Cardiac murmur, unspecified: Secondary | ICD-10-CM | POA: Diagnosis not present

## 2022-03-16 DIAGNOSIS — G629 Polyneuropathy, unspecified: Secondary | ICD-10-CM | POA: Diagnosis not present

## 2022-03-16 DIAGNOSIS — E1142 Type 2 diabetes mellitus with diabetic polyneuropathy: Secondary | ICD-10-CM | POA: Diagnosis not present

## 2022-03-16 DIAGNOSIS — N183 Chronic kidney disease, stage 3 unspecified: Secondary | ICD-10-CM | POA: Diagnosis not present

## 2022-03-16 DIAGNOSIS — F17200 Nicotine dependence, unspecified, uncomplicated: Secondary | ICD-10-CM | POA: Diagnosis not present

## 2022-03-16 DIAGNOSIS — I10 Essential (primary) hypertension: Secondary | ICD-10-CM | POA: Diagnosis not present

## 2022-03-16 DIAGNOSIS — T783XXA Angioneurotic edema, initial encounter: Secondary | ICD-10-CM | POA: Diagnosis not present

## 2022-03-16 DIAGNOSIS — R0602 Shortness of breath: Secondary | ICD-10-CM | POA: Diagnosis not present

## 2022-03-16 DIAGNOSIS — T464X5A Adverse effect of angiotensin-converting-enzyme inhibitors, initial encounter: Secondary | ICD-10-CM | POA: Diagnosis not present

## 2022-03-16 DIAGNOSIS — R011 Cardiac murmur, unspecified: Secondary | ICD-10-CM | POA: Diagnosis not present

## 2022-03-16 DIAGNOSIS — I2510 Atherosclerotic heart disease of native coronary artery without angina pectoris: Secondary | ICD-10-CM | POA: Diagnosis not present

## 2022-03-16 DIAGNOSIS — R55 Syncope and collapse: Secondary | ICD-10-CM | POA: Diagnosis not present

## 2022-04-09 DIAGNOSIS — R21 Rash and other nonspecific skin eruption: Secondary | ICD-10-CM | POA: Diagnosis not present

## 2022-04-09 DIAGNOSIS — M48062 Spinal stenosis, lumbar region with neurogenic claudication: Secondary | ICD-10-CM | POA: Diagnosis not present

## 2022-04-27 DIAGNOSIS — E1142 Type 2 diabetes mellitus with diabetic polyneuropathy: Secondary | ICD-10-CM | POA: Diagnosis not present

## 2022-04-27 DIAGNOSIS — D649 Anemia, unspecified: Secondary | ICD-10-CM | POA: Diagnosis not present

## 2022-04-27 DIAGNOSIS — I10 Essential (primary) hypertension: Secondary | ICD-10-CM | POA: Diagnosis not present

## 2022-04-27 DIAGNOSIS — E7800 Pure hypercholesterolemia, unspecified: Secondary | ICD-10-CM | POA: Diagnosis not present

## 2022-05-04 DIAGNOSIS — E1169 Type 2 diabetes mellitus with other specified complication: Secondary | ICD-10-CM | POA: Diagnosis not present

## 2022-05-04 DIAGNOSIS — M48062 Spinal stenosis, lumbar region with neurogenic claudication: Secondary | ICD-10-CM | POA: Diagnosis not present

## 2022-05-04 DIAGNOSIS — I350 Nonrheumatic aortic (valve) stenosis: Secondary | ICD-10-CM | POA: Diagnosis not present

## 2022-05-04 DIAGNOSIS — I10 Essential (primary) hypertension: Secondary | ICD-10-CM | POA: Diagnosis not present

## 2022-05-04 DIAGNOSIS — M545 Low back pain, unspecified: Secondary | ICD-10-CM | POA: Diagnosis not present

## 2022-05-04 DIAGNOSIS — E611 Iron deficiency: Secondary | ICD-10-CM | POA: Diagnosis not present

## 2022-05-04 DIAGNOSIS — I2510 Atherosclerotic heart disease of native coronary artery without angina pectoris: Secondary | ICD-10-CM | POA: Diagnosis not present

## 2022-05-04 DIAGNOSIS — B351 Tinea unguium: Secondary | ICD-10-CM | POA: Diagnosis not present

## 2022-05-04 DIAGNOSIS — E1142 Type 2 diabetes mellitus with diabetic polyneuropathy: Secondary | ICD-10-CM | POA: Diagnosis not present

## 2022-05-04 DIAGNOSIS — Z23 Encounter for immunization: Secondary | ICD-10-CM | POA: Diagnosis not present

## 2022-05-04 DIAGNOSIS — E7800 Pure hypercholesterolemia, unspecified: Secondary | ICD-10-CM | POA: Diagnosis not present

## 2022-05-04 DIAGNOSIS — G63 Polyneuropathy in diseases classified elsewhere: Secondary | ICD-10-CM | POA: Diagnosis not present

## 2022-05-18 DIAGNOSIS — D649 Anemia, unspecified: Secondary | ICD-10-CM | POA: Diagnosis not present

## 2022-05-18 DIAGNOSIS — Z86010 Personal history of colonic polyps: Secondary | ICD-10-CM | POA: Diagnosis not present

## 2022-06-24 ENCOUNTER — Other Ambulatory Visit: Payer: Self-pay | Admitting: Neurosurgery

## 2022-07-10 ENCOUNTER — Other Ambulatory Visit: Payer: Self-pay | Admitting: Neurosurgery

## 2022-07-20 DIAGNOSIS — E7800 Pure hypercholesterolemia, unspecified: Secondary | ICD-10-CM | POA: Diagnosis not present

## 2022-07-20 DIAGNOSIS — R0602 Shortness of breath: Secondary | ICD-10-CM | POA: Diagnosis not present

## 2022-07-20 DIAGNOSIS — N183 Chronic kidney disease, stage 3 unspecified: Secondary | ICD-10-CM | POA: Diagnosis not present

## 2022-07-20 DIAGNOSIS — M48062 Spinal stenosis, lumbar region with neurogenic claudication: Secondary | ICD-10-CM | POA: Diagnosis not present

## 2022-07-20 DIAGNOSIS — I10 Essential (primary) hypertension: Secondary | ICD-10-CM | POA: Diagnosis not present

## 2022-07-20 DIAGNOSIS — E1142 Type 2 diabetes mellitus with diabetic polyneuropathy: Secondary | ICD-10-CM | POA: Diagnosis not present

## 2022-07-20 DIAGNOSIS — R011 Cardiac murmur, unspecified: Secondary | ICD-10-CM | POA: Diagnosis not present

## 2022-07-20 DIAGNOSIS — I350 Nonrheumatic aortic (valve) stenosis: Secondary | ICD-10-CM | POA: Diagnosis not present

## 2022-07-20 DIAGNOSIS — I2510 Atherosclerotic heart disease of native coronary artery without angina pectoris: Secondary | ICD-10-CM | POA: Diagnosis not present

## 2022-07-26 ENCOUNTER — Inpatient Hospital Stay
Admission: EM | Admit: 2022-07-26 | Discharge: 2022-07-29 | DRG: 378 | Disposition: A | Discharge status: Home / Self Care | Payer: No Typology Code available for payment source | Attending: Internal Medicine | Admitting: Internal Medicine

## 2022-07-26 ENCOUNTER — Encounter: Payer: Self-pay | Admitting: Intensive Care

## 2022-07-26 ENCOUNTER — Other Ambulatory Visit: Payer: Self-pay

## 2022-07-26 DIAGNOSIS — K640 First degree hemorrhoids: Secondary | ICD-10-CM | POA: Diagnosis present

## 2022-07-26 DIAGNOSIS — Z7984 Long term (current) use of oral hypoglycemic drugs: Secondary | ICD-10-CM

## 2022-07-26 DIAGNOSIS — Z90710 Acquired absence of both cervix and uterus: Secondary | ICD-10-CM

## 2022-07-26 DIAGNOSIS — Z951 Presence of aortocoronary bypass graft: Secondary | ICD-10-CM

## 2022-07-26 DIAGNOSIS — I10 Essential (primary) hypertension: Secondary | ICD-10-CM | POA: Diagnosis not present

## 2022-07-26 DIAGNOSIS — D62 Acute posthemorrhagic anemia: Secondary | ICD-10-CM | POA: Diagnosis not present

## 2022-07-26 DIAGNOSIS — E1142 Type 2 diabetes mellitus with diabetic polyneuropathy: Secondary | ICD-10-CM | POA: Diagnosis not present

## 2022-07-26 DIAGNOSIS — E876 Hypokalemia: Secondary | ICD-10-CM | POA: Insufficient documentation

## 2022-07-26 DIAGNOSIS — K921 Melena: Secondary | ICD-10-CM | POA: Diagnosis present

## 2022-07-26 DIAGNOSIS — I129 Hypertensive chronic kidney disease with stage 1 through stage 4 chronic kidney disease, or unspecified chronic kidney disease: Secondary | ICD-10-CM | POA: Diagnosis present

## 2022-07-26 DIAGNOSIS — I2510 Atherosclerotic heart disease of native coronary artery without angina pectoris: Secondary | ICD-10-CM | POA: Diagnosis not present

## 2022-07-26 DIAGNOSIS — N183 Chronic kidney disease, stage 3 unspecified: Secondary | ICD-10-CM

## 2022-07-26 DIAGNOSIS — K31811 Angiodysplasia of stomach and duodenum with bleeding: Secondary | ICD-10-CM | POA: Diagnosis not present

## 2022-07-26 DIAGNOSIS — F17210 Nicotine dependence, cigarettes, uncomplicated: Secondary | ICD-10-CM | POA: Diagnosis present

## 2022-07-26 DIAGNOSIS — K219 Gastro-esophageal reflux disease without esophagitis: Secondary | ICD-10-CM | POA: Diagnosis present

## 2022-07-26 DIAGNOSIS — Z79899 Other long term (current) drug therapy: Secondary | ICD-10-CM

## 2022-07-26 DIAGNOSIS — E7800 Pure hypercholesterolemia, unspecified: Secondary | ICD-10-CM | POA: Diagnosis present

## 2022-07-26 DIAGNOSIS — E663 Overweight: Secondary | ICD-10-CM | POA: Insufficient documentation

## 2022-07-26 DIAGNOSIS — N1832 Chronic kidney disease, stage 3b: Secondary | ICD-10-CM | POA: Diagnosis present

## 2022-07-26 DIAGNOSIS — J441 Chronic obstructive pulmonary disease with (acute) exacerbation: Secondary | ICD-10-CM | POA: Diagnosis present

## 2022-07-26 DIAGNOSIS — D649 Anemia, unspecified: Secondary | ICD-10-CM | POA: Diagnosis not present

## 2022-07-26 DIAGNOSIS — I350 Nonrheumatic aortic (valve) stenosis: Secondary | ICD-10-CM | POA: Diagnosis present

## 2022-07-26 DIAGNOSIS — N1831 Chronic kidney disease, stage 3a: Secondary | ICD-10-CM | POA: Insufficient documentation

## 2022-07-26 DIAGNOSIS — R011 Cardiac murmur, unspecified: Secondary | ICD-10-CM | POA: Insufficient documentation

## 2022-07-26 DIAGNOSIS — R195 Other fecal abnormalities: Secondary | ICD-10-CM

## 2022-07-26 DIAGNOSIS — Z96652 Presence of left artificial knee joint: Secondary | ICD-10-CM | POA: Diagnosis present

## 2022-07-26 DIAGNOSIS — R5381 Other malaise: Secondary | ICD-10-CM | POA: Diagnosis present

## 2022-07-26 DIAGNOSIS — E1122 Type 2 diabetes mellitus with diabetic chronic kidney disease: Secondary | ICD-10-CM | POA: Diagnosis present

## 2022-07-26 LAB — CBC WITH DIFFERENTIAL/PLATELET
Abs Immature Granulocytes: 0.06 10*3/uL (ref 0.00–0.07)
Basophils Absolute: 0 10*3/uL (ref 0.0–0.1)
Basophils Relative: 0 %
Eosinophils Absolute: 0.1 10*3/uL (ref 0.0–0.5)
Eosinophils Relative: 1 %
HCT: 17.4 % — ABNORMAL LOW (ref 36.0–46.0)
Hemoglobin: 5.4 g/dL — ABNORMAL LOW (ref 12.0–15.0)
Immature Granulocytes: 1 %
Lymphocytes Relative: 23 %
Lymphs Abs: 2.2 10*3/uL (ref 0.7–4.0)
MCH: 36 pg — ABNORMAL HIGH (ref 26.0–34.0)
MCHC: 31 g/dL (ref 30.0–36.0)
MCV: 116 fL — ABNORMAL HIGH (ref 80.0–100.0)
Monocytes Absolute: 1.1 10*3/uL — ABNORMAL HIGH (ref 0.1–1.0)
Monocytes Relative: 11 %
Neutro Abs: 6.1 10*3/uL (ref 1.7–7.7)
Neutrophils Relative %: 64 %
Platelets: 207 10*3/uL (ref 150–400)
RBC: 1.5 MIL/uL — ABNORMAL LOW (ref 3.87–5.11)
RDW: 18.2 % — ABNORMAL HIGH (ref 11.5–15.5)
Smear Review: NORMAL
WBC: 9.6 10*3/uL (ref 4.0–10.5)
nRBC: 0.9 % — ABNORMAL HIGH (ref 0.0–0.2)

## 2022-07-26 LAB — HEMOGLOBIN A1C
Hgb A1c MFr Bld: 4.5 % — ABNORMAL LOW (ref 4.8–5.6)
Mean Plasma Glucose: 82.45 mg/dL

## 2022-07-26 LAB — COMPREHENSIVE METABOLIC PANEL
ALT: 15 U/L (ref 0–44)
AST: 22 U/L (ref 15–41)
Albumin: 3.6 g/dL (ref 3.5–5.0)
Alkaline Phosphatase: 40 U/L (ref 38–126)
Anion gap: 6 (ref 5–15)
BUN: 30 mg/dL — ABNORMAL HIGH (ref 8–23)
CO2: 27 mmol/L (ref 22–32)
Calcium: 13.1 mg/dL (ref 8.9–10.3)
Chloride: 104 mmol/L (ref 98–111)
Creatinine, Ser: 1.08 mg/dL — ABNORMAL HIGH (ref 0.44–1.00)
GFR, Estimated: 55 mL/min — ABNORMAL LOW (ref >60)
Glucose, Bld: 126 mg/dL — ABNORMAL HIGH (ref 70–99)
Potassium: 3.5 mmol/L (ref 3.5–5.1)
Sodium: 137 mmol/L (ref 135–145)
Total Bilirubin: 0.5 mg/dL (ref 0.3–1.2)
Total Protein: 6.5 g/dL (ref 6.5–8.1)

## 2022-07-26 LAB — PROTIME-INR
INR: 1.2 (ref 0.8–1.2)
Prothrombin Time: 14.8 s (ref 11.4–15.2)

## 2022-07-26 LAB — BASIC METABOLIC PANEL
Anion gap: 13 (ref 5–15)
BUN: 29 mg/dL — ABNORMAL HIGH (ref 8–23)
CO2: 24 mmol/L (ref 22–32)
Calcium: 12.9 mg/dL — ABNORMAL HIGH (ref 8.9–10.3)
Chloride: 99 mmol/L (ref 98–111)
Creatinine, Ser: 1.04 mg/dL — ABNORMAL HIGH (ref 0.44–1.00)
GFR, Estimated: 57 mL/min — ABNORMAL LOW (ref >60)
Glucose, Bld: 112 mg/dL — ABNORMAL HIGH (ref 70–99)
Potassium: 3.4 mmol/L — ABNORMAL LOW (ref 3.5–5.1)
Sodium: 136 mmol/L (ref 135–145)

## 2022-07-26 LAB — CBC
HCT: 20.3 % — ABNORMAL LOW (ref 36.0–46.0)
Hemoglobin: 6.5 g/dL — ABNORMAL LOW (ref 12.0–15.0)
MCH: 33.2 pg (ref 26.0–34.0)
MCHC: 32 g/dL (ref 30.0–36.0)
MCV: 103.6 fL — ABNORMAL HIGH (ref 80.0–100.0)
Platelets: 169 10*3/uL (ref 150–400)
RBC: 1.96 MIL/uL — ABNORMAL LOW (ref 3.87–5.11)
RDW: 22.5 % — ABNORMAL HIGH (ref 11.5–15.5)
WBC: 8.1 10*3/uL (ref 4.0–10.5)
nRBC: 0.7 % — ABNORMAL HIGH (ref 0.0–0.2)

## 2022-07-26 LAB — IRON AND TIBC
Iron: 58 ug/dL (ref 28–170)
Saturation Ratios: 12 % (ref 10.4–31.8)
TIBC: 469 ug/dL — ABNORMAL HIGH (ref 250–450)
UIBC: 411 ug/dL

## 2022-07-26 LAB — MAGNESIUM: Magnesium: 1.8 mg/dL (ref 1.7–2.4)

## 2022-07-26 LAB — PREPARE RBC (CROSSMATCH)

## 2022-07-26 LAB — GLUCOSE, CAPILLARY
Glucose-Capillary: 183 mg/dL — ABNORMAL HIGH (ref 70–99)
Glucose-Capillary: 100 mg/dL — ABNORMAL HIGH (ref 70–99)

## 2022-07-26 LAB — PHOSPHORUS: Phosphorus: 4.8 mg/dL — ABNORMAL HIGH (ref 2.5–4.6)

## 2022-07-26 LAB — FERRITIN: Ferritin: 11 ng/mL (ref 11–307)

## 2022-07-26 MED ORDER — ACETAMINOPHEN 650 MG RE SUPP: 650.0000 mg | Freq: Four times a day (QID) | RECTAL | Status: DC | PRN

## 2022-07-26 MED ORDER — PANTOPRAZOLE SODIUM 40 MG IV SOLR
40.0000 mg | Freq: Once | INTRAVENOUS | Status: AC
  Administered 2022-07-26: 40 mg via INTRAVENOUS
  Filled 2022-07-26: qty 10

## 2022-07-26 MED ORDER — GABAPENTIN 300 MG PO CAPS
1600.0000 mg | ORAL_CAPSULE | Freq: Every day | ORAL | Status: DC
  Administered 2022-07-26: 1600 mg via ORAL
  Filled 2022-07-26: qty 1

## 2022-07-26 MED ORDER — LACTATED RINGERS IV BOLUS
1000.0000 mL | Freq: Once | INTRAVENOUS | Status: AC
  Administered 2022-07-27: 1000 mL via INTRAVENOUS

## 2022-07-26 MED ORDER — ROSUVASTATIN CALCIUM 20 MG PO TABS
20.0000 mg | ORAL_TABLET | Freq: Every day | ORAL | Status: DC
  Administered 2022-07-26 – 2022-07-28 (×3): 20 mg via ORAL
  Filled 2022-07-26 (×3): qty 1

## 2022-07-26 MED ORDER — AMLODIPINE BESYLATE 5 MG PO TABS
5.0000 mg | ORAL_TABLET | Freq: Every day | ORAL | Status: DC
  Administered 2022-07-27 – 2022-07-29 (×3): 5 mg via ORAL
  Filled 2022-07-26 (×3): qty 1

## 2022-07-26 MED ORDER — SODIUM CHLORIDE 0.9% IV SOLUTION
Freq: Once | INTRAVENOUS | Status: AC
  Filled 2022-07-26: qty 250

## 2022-07-26 MED ORDER — LACTATED RINGERS IV SOLN
INTRAVENOUS | Status: AC
  Administered 2022-07-27: 1000 mL via INTRAVENOUS

## 2022-07-26 MED ORDER — SODIUM CHLORIDE 0.9% FLUSH
3.0000 mL | Freq: Two times a day (BID) | INTRAVENOUS | Status: DC
  Administered 2022-07-26 – 2022-07-28 (×5): 3 mL via INTRAVENOUS

## 2022-07-26 MED ORDER — ONDANSETRON HCL 4 MG PO TABS: 4.0000 mg | ORAL_TABLET | Freq: Four times a day (QID) | ORAL | Status: DC | PRN

## 2022-07-26 MED ORDER — GABAPENTIN 400 MG PO CAPS
800.0000 mg | ORAL_CAPSULE | Freq: Two times a day (BID) | ORAL | Status: DC
  Filled 2022-07-26 (×2): qty 2

## 2022-07-26 MED ORDER — FOLIC ACID 1 MG PO TABS
1.0000 mg | ORAL_TABLET | Freq: Every day | ORAL | Status: DC
  Administered 2022-07-26 – 2022-07-29 (×4): 1 mg via ORAL
  Filled 2022-07-26 (×4): qty 1

## 2022-07-26 MED ORDER — ACETAMINOPHEN 325 MG PO TABS: 650.0000 mg | ORAL_TABLET | Freq: Four times a day (QID) | ORAL | Status: DC | PRN

## 2022-07-26 MED ORDER — THIAMINE MONONITRATE 100 MG PO TABS
100.0000 mg | ORAL_TABLET | Freq: Every day | ORAL | Status: DC
  Administered 2022-07-26 – 2022-07-29 (×4): 100 mg via ORAL
  Filled 2022-07-26 (×4): qty 1

## 2022-07-26 MED ORDER — CARVEDILOL 6.25 MG PO TABS
6.2500 mg | ORAL_TABLET | Freq: Two times a day (BID) | ORAL | Status: DC
  Administered 2022-07-27 – 2022-07-29 (×5): 6.25 mg via ORAL
  Filled 2022-07-26 (×5): qty 1

## 2022-07-26 MED ORDER — ONDANSETRON HCL 4 MG/2ML IJ SOLN
4.0000 mg | Freq: Four times a day (QID) | INTRAMUSCULAR | Status: DC | PRN
  Administered 2022-07-27: 4 mg via INTRAVENOUS
  Filled 2022-07-26: qty 2

## 2022-07-26 MED ORDER — INSULIN ASPART 100 UNIT/ML IJ SOLN
0.0000 [IU] | Freq: Three times a day (TID) | INTRAMUSCULAR | Status: DC
  Administered 2022-07-26: 2 [IU] via SUBCUTANEOUS
  Administered 2022-07-28: 1 [IU] via SUBCUTANEOUS
  Filled 2022-07-26 (×2): qty 1

## 2022-07-26 MED ORDER — PANTOPRAZOLE SODIUM 40 MG IV SOLR
40.0000 mg | Freq: Two times a day (BID) | INTRAVENOUS | Status: DC
  Administered 2022-07-27 – 2022-07-28 (×4): 40 mg via INTRAVENOUS
  Filled 2022-07-26 (×4): qty 10

## 2022-07-26 NOTE — Assessment & Plan Note (Signed)
Patient has a history of CKD stage IIIb with creatinine ranging between 0.9 and 1.2.  Currently at baseline.  -Monitor BMP while admitted

## 2022-07-26 NOTE — ED Provider Notes (Signed)
[facility] Provider Note    Event Date/Time   First MD Initiated Contact with Patient 07/26/22 1307     (approximate)   History   Melena   HPI  Renee [name] is a 72 y.o. female who comes in with concern for dark stools.  Patient reports having some off-and-on dark stools for a little while now.  She reports being on a baby aspirin.  She denies any blood in her stool any abdominal pain any nausea or vomiting.  She reportedly had a little bit of weakness and which is why she presented today.  Denies any falls hitting her head or any other concerns.   Physical Exam   Triage Vital Signs: ED Triage Vitals [07/26/22 1201]  Enc Vitals Group     BP (!) 110/52     Pulse Rate 85     Resp 16     Temp 98.3 F (36.8 C)     Temp Source Oral     SpO2 100 %     Weight 161 lb (73 kg)     Height 5' 3"$  (1.6 m)     Head Circumference      Peak Flow      Pain Score 0     Pain Loc      Pain Edu?      Excl. in [place]?     Most recent vital signs: Vitals:   07/26/22 1201  BP: (!) 110/52  Pulse: 85  Resp: 16  Temp: 98.3 F (36.8 C)  SpO2: 100%     General: Awake, no distress.  CV:  Good peripheral perfusion.  Resp:  Normal effort.  Abd:  No distention.  Soft and nontender Other:  Exam brown stool Hemoccult positive   ED Results / Procedures / Treatments   Labs (all labs ordered are listed, but only abnormal results are displayed) Labs Reviewed  CBC WITH DIFFERENTIAL/PLATELET - Abnormal; Notable for the following components:      Result Value   RBC 1.50 (*)    Hemoglobin 5.4 (*)    HCT 17.4 (*)    MCV 116.0 (*)    MCH 36.0 (*)    RDW 18.2 (*)    nRBC 0.9 (*)    Monocytes Absolute 1.1 (*)    All other components within normal limits  COMPREHENSIVE METABOLIC PANEL - Abnormal; Notable for the following components:   Glucose, Bld 126 (*)    BUN 30 (*)    Creatinine, Ser 1.08 (*)    Calcium 13.1 (*)    GFR, Estimated 55 (*)    All other  components within normal limits     EKG  My interpretation of EKG:  Sinus rate of 86 without any ST elevation, T wave inversion in aVL, normal intervals  RADIOLOGY I have reviewed the xray personally and agree with radiology read   PROCEDURES:  Critical Care performed: Yes, see critical care procedure note(s)  .1-3 Lead EKG Interpretation  Performed by: Vanessa [place], MD Authorized by: Vanessa Lealman, MD     Interpretation: normal     ECG rate:  80   ECG rate assessment: normal     Rhythm: sinus rhythm     Ectopy: none     Conduction: normal   .Critical Care  Performed by: Vanessa [place], MD Authorized by: Vanessa Dalton, MD   Critical care provider statement:    Critical care time (minutes):  30   Critical  care was necessary to treat or prevent imminent or life-threatening deterioration of the following conditions: anemia.   Critical care was time spent personally by me on the following activities:  Development of treatment plan with patient or surrogate, discussions with consultants, evaluation of patient's response to treatment, examination of patient, ordering and review of laboratory studies, ordering and review of radiographic studies, ordering and performing treatments and interventions, pulse oximetry, re-evaluation of patient's condition and review of old charts    [place] [facility]: Medications  0.9 %  sodium chloride infusion (Manually program via Guardrails IV Fluids) (has no administration in time range)  pantoprazole (PROTONIX) injection 40 mg (has no administration in time range)     IMPRESSION / MDM / [place] / ED COURSE  I reviewed the triage vital signs and the nursing notes.   Patient's presentation is most consistent with acute presentation with potential threat to life or bodily function.   Differential is GI bleed, anemia, Electra abnormalities  Patient's labs show a calcium of 13.1.  Creatinine is normal.  Her hemoglobin is  5.4.  Will transfuse unit units of blood due to symptomatic anemia.  Will start on PPI.  Rectal exam is brown stool but is Hemoccult positive possible slow GI bleed.  Will discuss hospital team for admission.  The patient is on the cardiac monitor to evaluate for evidence of arrhythmia and/or significant heart rate changes.      FINAL CLINICAL IMPRESSION(S) / ED DIAGNOSES   Final diagnoses:  Symptomatic anemia  Dark stools     Rx / DC Orders   ED Discharge Orders     None        Note:  This document was prepared using Dragon voice recognition software and may include unintentional dictation errors.   Vanessa Lovelady, MD 07/26/22 [phone]

## 2022-07-26 NOTE — Assessment & Plan Note (Signed)
Most recent A1c within goal at 6.2%  - Hold home metformin - SSI, sensitive

## 2022-07-26 NOTE — Progress Notes (Signed)
Patient arrived to unit in stable condition. Ambulated with slightly unsteady gait from stretcher to bed.

## 2022-07-26 NOTE — H&P (Signed)
History and Physical    Patient: Renee [name] [id number] DOB: 29-Jan-1951 DOA: 07/26/2022 DOS: the patient was seen and examined on 07/26/2022 PCP: Adin Hector, MD  Patient coming from: Home  Chief Complaint:  Chief Complaint  Patient presents with   Melena   HPI: Renee [name] is a 72 y.o. female with medical history significant of CAD s/p CABG, severe aortic stenosis, hypertension, type 2 diabetes, CKD stage III, who presents to the ED due to dark stool.  [name] states that for the last month, she has been having intermittent dark stools that are black in color.  Then this week, she has been experiencing daily black stools.  In addition, she endorses generalized malaise and weakness.  Due to this, she went to her primary care who directed her to the ED.  She denies any dizziness, palpitations, shortness of breath or chest pain.  She denies any abdominal pain, nausea, vomiting, diarrhea.  She denies any prior known history of hypercalcemia.  ED course: On arrival to the ED, patient was normotensive at 110/52 with heart rate of 85.  She was saturating at 100% on room air.  Initial workup remarkable for potassium 3.5, BUN 30, creatinine of 1.08, calcium of 13.1, albumin of 3.6 and GFR 55.  CBC with hemoglobin of 5.4 and MCV of 116.  2 units of packed RBCs ordered.  Due to symptomatic anemia, [facility] contacted for admission.  Review of Systems: As mentioned in the history of present illness. All other systems reviewed and are negative.  Past Medical History:  Diagnosis Date   Arthritis    Chronic kidney disease    stage 3   Coronary artery disease    Diabetes mellitus without complication (HCC)    GERD (gastroesophageal reflux disease)    no meds   Heart murmur    Hypertension    Past Surgical History:  Procedure Laterality Date   ABDOMINAL HYSTERECTOMY     BREAST EXCISIONAL BIOPSY Right 20+ yrs ago   benign   CARDIAC SURGERY     cabg   CARPAL TUNNEL RELEASE Bilateral     COLONOSCOPY N/A 02/24/2017   Procedure: COLONOSCOPY;  Surgeon: Toledo, Benay Pike, MD;  Location: [facility] ENDOSCOPY;  Service: Endoscopy;  Laterality: N/A;   CORONARY ARTERY BYPASS GRAFT     1996   HERNIA REPAIR     umbilical   LUMBAR LAMINECTOMY/DECOMPRESSION MICRODISCECTOMY N/A 06/25/2021   Procedure: L2-3 DECOMPRESSION;  Surgeon: Meade Maw, MD;  Location: [facility] ORS;  Service: Neurosurgery;  Laterality: N/A;   TOTAL KNEE ARTHROPLASTY Left 10/25/2018   Procedure: TOTAL KNEE ARTHROPLASTY - LEFT - DIABETIC;  Surgeon: Hessie Knows, MD;  Location: [facility] ORS;  Service: Orthopedics;  Laterality: Left;   Social History:  reports that she has been smoking cigarettes. She has a 8.75 pack-year smoking history. She has never used smokeless tobacco. She reports current alcohol use. She reports that she does not use drugs.  Allergies  Allergen Reactions   Codeine Nausea And Vomiting   Tramadol Nausea And Vomiting    Family History  Problem Relation Age of Onset   Breast cancer Neg Hx     Prior to Admission medications   Medication Sig Start Date End Date Taking? Authorizing Provider  Alpha-Lipoic Acid 600 MG CAPS Take 600 mg by mouth daily.    [provider]  amLODipine (NORVASC) 5 MG tablet Take 5 mg by mouth daily.    [provider]  Azelastine HCl 137  MCG/SPRAY SOLN Place 1 spray into the nose 2 (two) times daily as needed (allergies). 05/29/21   [provider]  carvedilol (COREG) 6.25 MG tablet Take 6.25 mg by mouth 2 (two) times a day.     [provider]  Cholecalciferol (VITAMIN D3) 50 MCG (2000 UT) TABS Take 2,000 Units by mouth daily.    [provider]  CINNAMON PO Take 1,000 mg by mouth daily.    [provider]  [employer] SENNA 8.6 MG tablet TAKE 1 TABLET (8.6 MG TOTAL) BY MOUTH DAILY AS NEEDED FOR MILD CONSTIPATION. 06/25/22   Loleta Dicker, PA  DULoxetine (CYMBALTA) 30 MG capsule Take 90 mg by mouth daily.    [provider]  Ferrous Sulfate (SLOW RELEASE IRON PO) Take 1 tablet by mouth once a week.    [provider]  furosemide (LASIX) 20 MG tablet Take 20 mg by mouth once a week. In the morning    [provider]  gabapentin (NEURONTIN) 800 MG tablet Take 800-1,600 mg by mouth See admin instructions. Take 1 tablet (800 mg) by mouth in the morning, 1 tablet (800 mg) by mouth at noon, & take 2 tablets (1600 mg) by mouth at bedtime.    [provider]  hydrochlorothiazide (HYDRODIURIL) 25 MG tablet Take 25 mg by mouth daily.    [provider]  loratadine (CLARITIN) 10 MG tablet Take 10 mg by mouth daily as needed for allergies.    [provider]  metFORMIN (GLUCOPHAGE-XR) 500 MG 24 hr tablet Take 500 mg by mouth daily with breakfast. 03/28/21   [provider]  nitroGLYCERIN (NITROSTAT) 0.4 MG SL tablet Place 0.4 mg under the tongue every 5 (five) minutes as needed for chest pain.    [provider]  Omega-3 Fatty Acids (FISH OIL) 1000 MG CAPS Take 1,000 mg by mouth 3 (three) times daily.    [provider]  potassium chloride (KLOR-CON) 10 MEQ tablet Take 10 mEq by mouth daily.    [provider]  Propylene Glycol (SYSTANE BALANCE) 0.6 % SOLN Place 1 drop into both eyes daily as needed (dry eyes).    [provider]  rosuvastatin (CRESTOR) 20 MG tablet Take 20 mg by mouth at bedtime.    [provider]  tiZANidine (ZANAFLEX) 2 MG tablet TAKE 1 TABLET BY MOUTH 3 TIMES DAILY. 06/25/22   Loleta Dicker, PA  vitamin C (ASCORBIC ACID) 500 MG tablet Take 500 mg by mouth daily.    [provider]    Physical Exam: Vitals:   07/26/22 1201  BP: (!) 110/52  Pulse: 85  Resp: 16  Temp: 98.3 F (36.8 C)  TempSrc: Oral  SpO2: 100%  Weight: 73 kg  Height: 5' 3"$  (1.6 m)   Physical Exam Vitals and nursing note reviewed.  Constitutional:      General: She is not in acute distress.    Appearance:  She is normal weight. She is not toxic-appearing.  HENT:     Head: Normocephalic and atraumatic.     Mouth/Throat:     Mouth: Mucous membranes are moist.     Pharynx: Oropharynx is clear.  Eyes:     Conjunctiva/sclera: Conjunctivae normal.     Pupils: Pupils are equal, round, and reactive to light.  Cardiovascular:     Rate and Rhythm: Normal rate and regular rhythm.     Heart sounds: Murmur (4/6 holosystolic murmur heard throughout) heard.  Pulmonary:  Effort: Pulmonary effort is normal. No respiratory distress.     Breath sounds: Normal breath sounds.  Abdominal:     General: Bowel sounds are normal. There is no distension.     Palpations: Abdomen is soft.     Tenderness: There is no abdominal tenderness. There is no guarding.  Musculoskeletal:     Right lower leg: No edema.     Left lower leg: No edema.  Skin:    General: Skin is warm and dry.  Neurological:     General: No focal deficit present.     Mental Status: She is alert and oriented to person, place, and time. Mental status is at baseline.  Psychiatric:        Mood and Affect: Mood normal.        Behavior: Behavior normal.    Data Reviewed: CBC with WBC of 9.6, hemoglobin of 5.4, MCV 116 and platelets of 207. CMP with sodium 137, potassium 3.5, bicarb 27, glucose 126, BUN 30, creatinine 1.0\8, calcium 13.1, AST 22, ALT 15, albumin 3.6, GFR 55  EKG pending.   There are no new results to review at this time.  Assessment and Plan:  * Melena Patient endorses 1 month history of intermittent melena that has been persistent for the last 7 days with evidence of acute drop in hemoglobin.  Previously 10.6 approximately 3 months ago, currently 5.4.  Last colonoscopy in 2018 with no significant findings, no history of prior EGD.  No NSAID use or daily alcohol use.  Given patient has a scheduled colonoscopy in 08/03/2022, will consult GI to see if that needs to be moved up.  - GI consulted; appreciate their  recommendations - Please see below for management of anemia - Protonix 40 mg IV twice daily - Clear liquid diet for now.  N.p.o. after midnight for now  Acute blood loss anemia In the setting of GI bleed.  Patient endorses malaise and weakness.  No evidence of hemodynamic instability.  - 2 units of packed RBCs ordered, administer 1 unit first and recheck CBC prior to second unit - Posttransfusion CBC pending - Transfuse for hemoglobin less than 7 - Will check iron panel  Hypercalcemia Markedly elevated calcium on admission at 13.1 prior history of CKD, however calcium approximately 3 months ago was within normal limits at 9.6.  Differential includes lab error, versus hyperparathyroidism versus potential underlying malignancy given 1 month of melena.  Patient is on a thiazide, however I would not expect it to cause this level of hypercalcemia.  - Repeat i-STAT pending to confirm hypercalcemia - If remains elevated, obtain PTH, calcium, phosphorus - IV fluid resuscitation if remains elevated - Hold home hydrochlorothiazide  CKD (chronic kidney disease), stage III ([place]) Patient has a history of CKD stage IIIb with creatinine ranging between 0.9 and 1.2.  Currently at baseline.  -Monitor BMP while admitted  CAD in native artery - Hold home aspirin in the setting of GI bleed  Type 2 diabetes mellitus with diabetic polyneuropathy, without long-term current use of insulin (HCC) Most recent A1c within goal at 6.2%  - Hold home metformin - SSI, sensitive  Essential hypertension - Continue home amlodipine, carvedilol - Hold home hydrochlorothiazide given hypercalcemia  Advance Care Planning:   Code Status: Full Code verified by patient  Consults: GI  Family Communication: No family at bedside  Severity of Illness: The appropriate patient status for this patient is OBSERVATION. Observation status is judged to be reasonable and necessary in order to  provide the required intensity of  service to ensure the patient's safety. The patient's presenting symptoms, physical exam findings, and initial radiographic and laboratory data in the context of their medical condition is felt to place them at decreased risk for further clinical deterioration. Furthermore, it is anticipated that the patient will be medically stable for discharge from the hospital within 2 midnights of admission.   Author: Jose Persia, MD 07/26/2022 2:35 PM  For on call review [email or web address].

## 2022-07-26 NOTE — ED Triage Notes (Signed)
Patient c/o "dark" stool. Denies seeing any blood in stools. Denies abdominal pain. Reports some diarrhea. Denies N/V

## 2022-07-26 NOTE — Assessment & Plan Note (Signed)
-   Hold home aspirin in the setting of GI bleed

## 2022-07-26 NOTE — Assessment & Plan Note (Addendum)
In the setting of GI bleed.  Patient endorses malaise and weakness.  No evidence of hemodynamic instability.  - 2 units of packed RBCs ordered, administer 1 unit first and recheck CBC prior to second unit - Posttransfusion CBC pending - Transfuse for hemoglobin less than 7 - Will check iron panel

## 2022-07-26 NOTE — H&P (View-Only) (Signed)
GI Inpatient Consult Note  Reason for Consult: Melena, symptomatic anemia    Attending Requesting Consult: Dr. Jose Persia, MD  History of Present Illness: Renee [name] is a 72 y.o. female seen for evaluation of melena and symptomatic anemia at the request of admitting hospitalist - Dr. Jose Persia. Patient has a PMH of HTN, HLD, T2DM, CKD Stage III, severe aortic stenosis, OA, and CAD s/p CABG. She presented to the [facility] ED this afternoon for chief complaint of malaise, generalized weakness, and black, tarry stools. She endorses intermittent black, tarry stools over the past month and have been daily over the past week. She went to see a doctor in [place] for these complaints and was advised to come to the hospital. Upon presentation to the ED, she had normal vital signs. Labs were significant for potassium 3.5, serum creatinine 1.08, BUN 30, albumin 3.6, calcium 13.1, hemoglobin 5.4, and MCV 116. Hemoglobin in November three months ago was 10.6 with MCV 105. Per ED physician, rectal exam showed brown stool which was guaiac positive. 2 units pRBCs were ordered and hospitalist contacted for admission. GI consulted for further evaluation and management.   Patient seen and examined this afternoon resting comfortably in hospital bed. She has had no further bowel movements since being in the hospital. She denies any abdominal pain, nausea, vomiting, dysphagia, odynophagia, diarrhea, constipation, or frank hematochezia. She denies any unintentional weight loss or loss of appetite. She saw our office back in December few months ago to arrange elective surveillance colonoscopy and is currently scheduled for colonoscopy 2/26 with Dr. Virgina Jock. She has never had an EGD. She denies any hx of GERD symptoms requiring daily medication. She denies any frequent NSAID use. She takes Tylenol as needed. She does use tobacco on daily basis but cannot quantify # for me. She does drink EtOH occasionally - few shots  couple days out of the week.    Summary of GI Procedures:  CSY 02/24/2017 - two 4-5 mm TAs removed from proximal transverse colon, otherwise normal examined colon  Past Medical History:  Past Medical History:  Diagnosis Date   Arthritis    Chronic kidney disease    stage 3   Coronary artery disease    Diabetes mellitus without complication (HCC)    GERD (gastroesophageal reflux disease)    no meds   Heart murmur    Hypertension     Problem List: Patient Active Problem List   Diagnosis Date Noted   Melena 07/26/2022   Heart murmur 07/26/2022   Acute blood loss anemia 07/26/2022   Hypercalcemia 07/26/2022   CKD (chronic kidney disease), stage III ([place]) 07/26/2022   Severe aortic valve stenosis 05/04/2022   Spinal stenosis, lumbar region, with neurogenic claudication 05/08/2021   Lumbar spondylosis 05/08/2021   Lumbar degenerative disc disease 05/08/2021   Onychomycosis of multiple toenails with type 2 diabetes mellitus ([place]) 04/20/2019   Status post total knee replacement using cement, left 10/25/2018   Chronic midline low back pain without sciatica 02/28/2018   Essential hypertension 02/02/2017   Primary osteoarthritis of left knee 10/28/2016   CAD in native artery 07/28/2016   Pure hypercholesterolemia 07/28/2016   Type 2 diabetes mellitus with diabetic polyneuropathy, without long-term current use of insulin ([place]) 07/28/2016   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 ([place]) 07/27/2016    Past Surgical History: Past Surgical History:  Procedure Laterality Date   ABDOMINAL HYSTERECTOMY     BREAST EXCISIONAL BIOPSY Right 20+ yrs ago   benign  CARDIAC SURGERY     cabg   CARPAL TUNNEL RELEASE Bilateral    COLONOSCOPY N/A 02/24/2017   Procedure: COLONOSCOPY;  Surgeon: Toledo, Benay Pike, MD;  Location: [facility] ENDOSCOPY;  Service: Endoscopy;  Laterality: N/A;   CORONARY ARTERY BYPASS GRAFT     1996   HERNIA REPAIR     umbilical   LUMBAR LAMINECTOMY/DECOMPRESSION  MICRODISCECTOMY N/A 06/25/2021   Procedure: L2-3 DECOMPRESSION;  Surgeon: Meade Maw, MD;  Location: [facility] ORS;  Service: Neurosurgery;  Laterality: N/A;   TOTAL KNEE ARTHROPLASTY Left 10/25/2018   Procedure: TOTAL KNEE ARTHROPLASTY - LEFT - DIABETIC;  Surgeon: Hessie Knows, MD;  Location: [facility] ORS;  Service: Orthopedics;  Laterality: Left;    Allergies: Allergies  Allergen Reactions   Venlafaxine Other (See Comments)    intolerant   Codeine Nausea And Vomiting   Tramadol Nausea And Vomiting    Home Medications: (Not in a hospital admission)  Home medication reconciliation was completed with the patient.   Scheduled Inpatient Medications:    sodium chloride   Intravenous Once   folic acid  1 mg Oral Daily   insulin aspart  0-9 Units Subcutaneous TID WC   pantoprazole (PROTONIX) IV  40 mg Intravenous Q12H   sodium chloride flush  3 mL Intravenous Q12H   thiamine  100 mg Oral Daily    Continuous Inpatient Infusions:    PRN Inpatient Medications:  acetaminophen **OR** acetaminophen, ondansetron **OR** ondansetron (ZOFRAN) IV  Family History: family history is not on file.  The patient's family history is negative for inflammatory bowel disorders, GI malignancy, or solid organ transplantation.  Social History:   reports that she has been smoking cigarettes. She has a 8.75 pack-year smoking history. She has never used smokeless tobacco. She reports current alcohol use. She reports that she does not use drugs. The patient denies ETOH, tobacco, or drug use.   Review of Systems: Constitutional: Weight is stable.  Eyes: No changes in vision. ENT: No oral lesions, sore throat.  GI: see HPI.  Heme/Lymph: No easy bruising.  CV: No chest pain.  GU: No hematuria.  Integumentary: No rashes.  Neuro: No headaches.  Psych: No depression/anxiety.  Endocrine: No heat/cold intolerance.  Allergic/Immunologic: No urticaria.  Resp: No cough, SOB.  Musculoskeletal: No joint  swelling.    Physical Examination: BP (!) 110/52 (BP Location: Right Arm)   Pulse 85   Temp 98.3 F (36.8 C) (Oral)   Resp 17   Ht 5' 3"$  (1.6 m)   Wt 73 kg   SpO2 100%   BMI 28.52 kg/m  Gen: NAD, alert and oriented x 4 HEENT: PEERLA, EOMI, Neck: supple, no JVD or thyromegaly Chest: CTA bilaterally, no wheezes, crackles, or other adventitious sounds CV: RRR, no m/g/c/r Abd: soft, NT, ND, +BS in all four quadrants; no HSM, guarding, ridigity, or rebound tenderness Ext: no edema, well perfused with 2+ pulses, Skin: no rash or lesions noted Lymph: no LAD  Data: Lab Results  Component Value Date   WBC 9.6 07/26/2022   HGB 5.4 (L) 07/26/2022   HCT 17.4 (L) 07/26/2022   MCV 116.0 (H) 07/26/2022   PLT 207 07/26/2022   Recent Labs  Lab 07/26/22 1203  HGB 5.4*   Lab Results  Component Value Date   NA 137 07/26/2022   K 3.5 07/26/2022   CL 104 07/26/2022   CO2 27 07/26/2022   BUN 30 (H) 07/26/2022   CREATININE 1.08 (H) 07/26/2022   Lab Results  Component Value  Date   ALT 15 07/26/2022   AST 22 07/26/2022   ALKPHOS 40 07/26/2022   BILITOT 0.5 07/26/2022   Recent Labs  Lab 07/26/22 1355  INR 1.2    Assessment/Plan:  72 y/o AA female with a PMH of HTN, T2DM, CKD Stage III, severe aortic stenosis, OA, and CAD s/p CABG presented to the [facility] ED this afternoon for chief complaint of melanotic stools with associated generalized weakness and malaise found to have blood loss anemia with drop in hemoglobin to 5.4.   Melena/Blood loss anemia - suspect chronic GI blood loss. DDx includes peptic ulcer disease, gastritis +/- H pylori, duodenitis, erosive esophagitis, Dieulafoy's lesion, GAVE, AVMs, polyps, neoplasm, etc. Hemoglobin decreased to 5.4 this afternoon from 10.6 three months ago. DRE showed brown stool which was guaiac positive.   Hypercalcemia (13.1)  CKD Stage III  Iron-deficiency anemia - ferritin 11, iron sat 12%, TIBC 469  Heme positive stool  History of  adenomatous colon polyps   Recommendations:  - Maintain 2 large bore IVs for access - Transfuse 2 units pRBCs as ordered - Continue to monitor serial H&H. Transfuse for hemoglobin <7.0.  - No signs of overt gastrointestinal bleeding. Currently hemodynamically stable.  - Continue Protonix 40 mg IV BID for gastric protection  - Continue supportive care and management of medical comorbidities per primary team - Hold aspirin and all antiplatelet therapy - Advise EGD for luminal evaluation and discussed procedure details and indications with patient in room today. She consents to proceed.  - EGD tomorrow with Dr. Alice Reichert - Clear liquid diet today. NPO midnight.  - If EGD is negative, will proceed with colonoscopy - GI following along with you   I reviewed the risks (including bleeding, perforation, infection, anesthesia complications, cardiac/respiratory complications), benefits and alternatives of EGD. Patient consents to proceed.    Thank you for the consult. Please call with questions or concerns.  Geanie Kenning, PA-C [facility] [phone]

## 2022-07-26 NOTE — Assessment & Plan Note (Signed)
Markedly elevated calcium on admission at 13.1 prior history of CKD, however calcium approximately 3 months ago was within normal limits at 9.6.  Differential includes lab error, versus hyperparathyroidism versus potential underlying malignancy given 1 month of melena.  Patient is on a thiazide, however I would not expect it to cause this level of hypercalcemia.  - Repeat i-STAT pending to confirm hypercalcemia - If remains elevated, obtain PTH, calcium, phosphorus - IV fluid resuscitation if remains elevated - Hold home hydrochlorothiazide

## 2022-07-26 NOTE — Assessment & Plan Note (Signed)
-   Continue home amlodipine, carvedilol - Hold home hydrochlorothiazide given hypercalcemia

## 2022-07-26 NOTE — Consult Note (Signed)
GI Inpatient Consult Note  Reason for Consult: Melena, symptomatic anemia    Attending Requesting Consult: Dr. Jose Persia, MD  History of Present Illness: Renee [name] is a 72 y.o. female seen for evaluation of melena and symptomatic anemia at the request of admitting hospitalist - Dr. Jose Persia. Patient has a PMH of HTN, HLD, T2DM, CKD Stage III, severe aortic stenosis, OA, and CAD s/p CABG. She presented to the [facility] ED this afternoon for chief complaint of malaise, generalized weakness, and black, tarry stools. She endorses intermittent black, tarry stools over the past month and have been daily over the past week. She went to see a doctor in [place] for these complaints and was advised to come to the hospital. Upon presentation to the ED, she had normal vital signs. Labs were significant for potassium 3.5, serum creatinine 1.08, BUN 30, albumin 3.6, calcium 13.1, hemoglobin 5.4, and MCV 116. Hemoglobin in November three months ago was 10.6 with MCV 105. Per ED physician, rectal exam showed brown stool which was guaiac positive. 2 units pRBCs were ordered and hospitalist contacted for admission. GI consulted for further evaluation and management.   Patient seen and examined this afternoon resting comfortably in hospital bed. She has had no further bowel movements since being in the hospital. She denies any abdominal pain, nausea, vomiting, dysphagia, odynophagia, diarrhea, constipation, or frank hematochezia. She denies any unintentional weight loss or loss of appetite. She saw our office back in December few months ago to arrange elective surveillance colonoscopy and is currently scheduled for colonoscopy 2/26 with Dr. Virgina Jock. She has never had an EGD. She denies any hx of GERD symptoms requiring daily medication. She denies any frequent NSAID use. She takes Tylenol as needed. She does use tobacco on daily basis but cannot quantify # for me. She does drink EtOH occasionally - few shots  couple days out of the week.    Summary of GI Procedures:  CSY 02/24/2017 - two 4-5 mm TAs removed from proximal transverse colon, otherwise normal examined colon  Past Medical History:  Past Medical History:  Diagnosis Date   Arthritis    Chronic kidney disease    stage 3   Coronary artery disease    Diabetes mellitus without complication (HCC)    GERD (gastroesophageal reflux disease)    no meds   Heart murmur    Hypertension     Problem List: Patient Active Problem List   Diagnosis Date Noted   Melena 07/26/2022   Heart murmur 07/26/2022   Acute blood loss anemia 07/26/2022   Hypercalcemia 07/26/2022   CKD (chronic kidney disease), stage III ([place]) 07/26/2022   Severe aortic valve stenosis 05/04/2022   Spinal stenosis, lumbar region, with neurogenic claudication 05/08/2021   Lumbar spondylosis 05/08/2021   Lumbar degenerative disc disease 05/08/2021   Onychomycosis of multiple toenails with type 2 diabetes mellitus ([place]) 04/20/2019   Status post total knee replacement using cement, left 10/25/2018   Chronic midline low back pain without sciatica 02/28/2018   Essential hypertension 02/02/2017   Primary osteoarthritis of left knee 10/28/2016   CAD in native artery 07/28/2016   Pure hypercholesterolemia 07/28/2016   Type 2 diabetes mellitus with diabetic polyneuropathy, without long-term current use of insulin ([place]) 07/28/2016   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 ([place]) 07/27/2016    Past Surgical History: Past Surgical History:  Procedure Laterality Date   ABDOMINAL HYSTERECTOMY     BREAST EXCISIONAL BIOPSY Right 20+ yrs ago   benign  CARDIAC SURGERY     cabg   CARPAL TUNNEL RELEASE Bilateral    COLONOSCOPY N/A 02/24/2017   Procedure: COLONOSCOPY;  Surgeon: Toledo, Benay Pike, MD;  Location: [facility] ENDOSCOPY;  Service: Endoscopy;  Laterality: N/A;   CORONARY ARTERY BYPASS GRAFT     1996   HERNIA REPAIR     umbilical   LUMBAR LAMINECTOMY/DECOMPRESSION  MICRODISCECTOMY N/A 06/25/2021   Procedure: L2-3 DECOMPRESSION;  Surgeon: Meade Maw, MD;  Location: [facility] ORS;  Service: Neurosurgery;  Laterality: N/A;   TOTAL KNEE ARTHROPLASTY Left 10/25/2018   Procedure: TOTAL KNEE ARTHROPLASTY - LEFT - DIABETIC;  Surgeon: Hessie Knows, MD;  Location: [facility] ORS;  Service: Orthopedics;  Laterality: Left;    Allergies: Allergies  Allergen Reactions   Venlafaxine Other (See Comments)    intolerant   Codeine Nausea And Vomiting   Tramadol Nausea And Vomiting    Home Medications: (Not in a hospital admission)  Home medication reconciliation was completed with the patient.   Scheduled Inpatient Medications:    sodium chloride   Intravenous Once   folic acid  1 mg Oral Daily   insulin aspart  0-9 Units Subcutaneous TID WC   pantoprazole (PROTONIX) IV  40 mg Intravenous Q12H   sodium chloride flush  3 mL Intravenous Q12H   thiamine  100 mg Oral Daily    Continuous Inpatient Infusions:    PRN Inpatient Medications:  acetaminophen **OR** acetaminophen, ondansetron **OR** ondansetron (ZOFRAN) IV  Family History: family history is not on file.  The patient's family history is negative for inflammatory bowel disorders, GI malignancy, or solid organ transplantation.  Social History:   reports that she has been smoking cigarettes. She has a 8.75 pack-year smoking history. She has never used smokeless tobacco. She reports current alcohol use. She reports that she does not use drugs. The patient denies ETOH, tobacco, or drug use.   Review of Systems: Constitutional: Weight is stable.  Eyes: No changes in vision. ENT: No oral lesions, sore throat.  GI: see HPI.  Heme/Lymph: No easy bruising.  CV: No chest pain.  GU: No hematuria.  Integumentary: No rashes.  Neuro: No headaches.  Psych: No depression/anxiety.  Endocrine: No heat/cold intolerance.  Allergic/Immunologic: No urticaria.  Resp: No cough, SOB.  Musculoskeletal: No joint  swelling.    Physical Examination: BP (!) 110/52 (BP Location: Right Arm)   Pulse 85   Temp 98.3 F (36.8 C) (Oral)   Resp 17   Ht 5' 3"$  (1.6 m)   Wt 73 kg   SpO2 100%   BMI 28.52 kg/m  Gen: NAD, alert and oriented x 4 HEENT: PEERLA, EOMI, Neck: supple, no JVD or thyromegaly Chest: CTA bilaterally, no wheezes, crackles, or other adventitious sounds CV: RRR, no m/g/c/r Abd: soft, NT, ND, +BS in all four quadrants; no HSM, guarding, ridigity, or rebound tenderness Ext: no edema, well perfused with 2+ pulses, Skin: no rash or lesions noted Lymph: no LAD  Data: Lab Results  Component Value Date   WBC 9.6 07/26/2022   HGB 5.4 (L) 07/26/2022   HCT 17.4 (L) 07/26/2022   MCV 116.0 (H) 07/26/2022   PLT 207 07/26/2022   Recent Labs  Lab 07/26/22 1203  HGB 5.4*   Lab Results  Component Value Date   NA 137 07/26/2022   K 3.5 07/26/2022   CL 104 07/26/2022   CO2 27 07/26/2022   BUN 30 (H) 07/26/2022   CREATININE 1.08 (H) 07/26/2022   Lab Results  Component Value  Date   ALT 15 07/26/2022   AST 22 07/26/2022   ALKPHOS 40 07/26/2022   BILITOT 0.5 07/26/2022   Recent Labs  Lab 07/26/22 1355  INR 1.2    Assessment/Plan:  72 y/o AA female with a PMH of HTN, T2DM, CKD Stage III, severe aortic stenosis, OA, and CAD s/p CABG presented to the [facility] ED this afternoon for chief complaint of melanotic stools with associated generalized weakness and malaise found to have blood loss anemia with drop in hemoglobin to 5.4.   Melena/Blood loss anemia - suspect chronic GI blood loss. DDx includes peptic ulcer disease, gastritis +/- H pylori, duodenitis, erosive esophagitis, Dieulafoy's lesion, GAVE, AVMs, polyps, neoplasm, etc. Hemoglobin decreased to 5.4 this afternoon from 10.6 three months ago. DRE showed brown stool which was guaiac positive.   Hypercalcemia (13.1)  CKD Stage III  Iron-deficiency anemia - ferritin 11, iron sat 12%, TIBC 469  Heme positive stool  History of  adenomatous colon polyps   Recommendations:  - Maintain 2 large bore IVs for access - Transfuse 2 units pRBCs as ordered - Continue to monitor serial H&H. Transfuse for hemoglobin <7.0.  - No signs of overt gastrointestinal bleeding. Currently hemodynamically stable.  - Continue Protonix 40 mg IV BID for gastric protection  - Continue supportive care and management of medical comorbidities per primary team - Hold aspirin and all antiplatelet therapy - Advise EGD for luminal evaluation and discussed procedure details and indications with patient in room today. She consents to proceed.  - EGD tomorrow with Dr. Alice Reichert - Clear liquid diet today. NPO midnight.  - If EGD is negative, will proceed with colonoscopy - GI following along with you   I reviewed the risks (including bleeding, perforation, infection, anesthesia complications, cardiac/respiratory complications), benefits and alternatives of EGD. Patient consents to proceed.    Thank you for the consult. Please call with questions or concerns.  Geanie Kenning, PA-C [facility] Gastroenterology [phone]

## 2022-07-26 NOTE — ED Notes (Signed)
Advised nurse that patient has ready bed 

## 2022-07-26 NOTE — Assessment & Plan Note (Signed)
Patient endorses 1 month history of intermittent melena that has been persistent for the last 7 days with evidence of acute drop in hemoglobin.  Previously 10.6 approximately 3 months ago, currently 5.4.  Last colonoscopy in 2018 with no significant findings, no history of prior EGD.  No NSAID use or daily alcohol use.  Given patient has a scheduled colonoscopy in 08/03/2022, will consult GI to see if that needs to be moved up.  - GI consulted; appreciate their recommendations - Please see below for management of anemia - Protonix 40 mg IV twice daily - Clear liquid diet for now.  N.p.o. after midnight for now

## 2022-07-27 ENCOUNTER — Inpatient Hospital Stay: Payer: No Typology Code available for payment source | Admitting: Anesthesiology

## 2022-07-27 ENCOUNTER — Encounter: Payer: Self-pay | Admitting: Internal Medicine

## 2022-07-27 ENCOUNTER — Encounter
Admission: EM | Disposition: A | Discharge status: Home / Self Care | Payer: Self-pay | Source: Home / Self Care | Attending: Internal Medicine

## 2022-07-27 DIAGNOSIS — D62 Acute posthemorrhagic anemia: Secondary | ICD-10-CM | POA: Diagnosis not present

## 2022-07-27 DIAGNOSIS — E876 Hypokalemia: Secondary | ICD-10-CM | POA: Diagnosis not present

## 2022-07-27 DIAGNOSIS — K640 First degree hemorrhoids: Secondary | ICD-10-CM | POA: Diagnosis not present

## 2022-07-27 DIAGNOSIS — E7800 Pure hypercholesterolemia, unspecified: Secondary | ICD-10-CM | POA: Diagnosis not present

## 2022-07-27 DIAGNOSIS — Z951 Presence of aortocoronary bypass graft: Secondary | ICD-10-CM | POA: Diagnosis not present

## 2022-07-27 DIAGNOSIS — K219 Gastro-esophageal reflux disease without esophagitis: Secondary | ICD-10-CM | POA: Diagnosis not present

## 2022-07-27 DIAGNOSIS — N1832 Chronic kidney disease, stage 3b: Secondary | ICD-10-CM | POA: Diagnosis not present

## 2022-07-27 DIAGNOSIS — E1122 Type 2 diabetes mellitus with diabetic chronic kidney disease: Secondary | ICD-10-CM | POA: Diagnosis not present

## 2022-07-27 DIAGNOSIS — N1831 Chronic kidney disease, stage 3a: Secondary | ICD-10-CM | POA: Diagnosis not present

## 2022-07-27 DIAGNOSIS — K2970 Gastritis, unspecified, without bleeding: Secondary | ICD-10-CM | POA: Diagnosis not present

## 2022-07-27 DIAGNOSIS — Z7984 Long term (current) use of oral hypoglycemic drugs: Secondary | ICD-10-CM | POA: Diagnosis not present

## 2022-07-27 DIAGNOSIS — K921 Melena: Secondary | ICD-10-CM | POA: Diagnosis not present

## 2022-07-27 DIAGNOSIS — E663 Overweight: Secondary | ICD-10-CM | POA: Insufficient documentation

## 2022-07-27 DIAGNOSIS — Z01810 Encounter for preprocedural cardiovascular examination: Secondary | ICD-10-CM | POA: Diagnosis not present

## 2022-07-27 DIAGNOSIS — F17210 Nicotine dependence, cigarettes, uncomplicated: Secondary | ICD-10-CM | POA: Diagnosis not present

## 2022-07-27 DIAGNOSIS — Z90710 Acquired absence of both cervix and uterus: Secondary | ICD-10-CM | POA: Diagnosis not present

## 2022-07-27 DIAGNOSIS — I129 Hypertensive chronic kidney disease with stage 1 through stage 4 chronic kidney disease, or unspecified chronic kidney disease: Secondary | ICD-10-CM | POA: Diagnosis not present

## 2022-07-27 DIAGNOSIS — J441 Chronic obstructive pulmonary disease with (acute) exacerbation: Secondary | ICD-10-CM | POA: Diagnosis present

## 2022-07-27 DIAGNOSIS — I2510 Atherosclerotic heart disease of native coronary artery without angina pectoris: Secondary | ICD-10-CM | POA: Diagnosis not present

## 2022-07-27 DIAGNOSIS — K31811 Angiodysplasia of stomach and duodenum with bleeding: Secondary | ICD-10-CM | POA: Diagnosis not present

## 2022-07-27 DIAGNOSIS — K649 Unspecified hemorrhoids: Secondary | ICD-10-CM | POA: Diagnosis not present

## 2022-07-27 DIAGNOSIS — E1142 Type 2 diabetes mellitus with diabetic polyneuropathy: Secondary | ICD-10-CM | POA: Diagnosis not present

## 2022-07-27 DIAGNOSIS — K625 Hemorrhage of anus and rectum: Secondary | ICD-10-CM | POA: Diagnosis not present

## 2022-07-27 DIAGNOSIS — Z79899 Other long term (current) drug therapy: Secondary | ICD-10-CM | POA: Diagnosis not present

## 2022-07-27 DIAGNOSIS — R5381 Other malaise: Secondary | ICD-10-CM | POA: Diagnosis not present

## 2022-07-27 DIAGNOSIS — I350 Nonrheumatic aortic (valve) stenosis: Secondary | ICD-10-CM

## 2022-07-27 DIAGNOSIS — Z96652 Presence of left artificial knee joint: Secondary | ICD-10-CM | POA: Diagnosis not present

## 2022-07-27 DIAGNOSIS — K31819 Angiodysplasia of stomach and duodenum without bleeding: Secondary | ICD-10-CM | POA: Diagnosis not present

## 2022-07-27 DIAGNOSIS — K3189 Other diseases of stomach and duodenum: Secondary | ICD-10-CM | POA: Diagnosis not present

## 2022-07-27 HISTORY — PX: ESOPHAGOGASTRODUODENOSCOPY (EGD) WITH PROPOFOL: SHX5813

## 2022-07-27 LAB — COMPREHENSIVE METABOLIC PANEL
ALT: 13 U/L (ref 0–44)
AST: 16 U/L (ref 15–41)
Albumin: 3.1 g/dL — ABNORMAL LOW (ref 3.5–5.0)
Alkaline Phosphatase: 36 U/L — ABNORMAL LOW (ref 38–126)
Anion gap: 9 (ref 5–15)
BUN: 26 mg/dL — ABNORMAL HIGH (ref 8–23)
CO2: 26 mmol/L (ref 22–32)
Calcium: 11.3 mg/dL — ABNORMAL HIGH (ref 8.9–10.3)
Chloride: 102 mmol/L (ref 98–111)
Creatinine, Ser: 1.02 mg/dL — ABNORMAL HIGH (ref 0.44–1.00)
GFR, Estimated: 58 mL/min — ABNORMAL LOW (ref >60)
Glucose, Bld: 100 mg/dL — ABNORMAL HIGH (ref 70–99)
Potassium: 3.1 mmol/L — ABNORMAL LOW (ref 3.5–5.1)
Sodium: 137 mmol/L (ref 135–145)
Total Bilirubin: 0.6 mg/dL (ref 0.3–1.2)
Total Protein: 5.4 g/dL — ABNORMAL LOW (ref 6.5–8.1)

## 2022-07-27 LAB — CBC
HCT: 21.9 % — ABNORMAL LOW (ref 36.0–46.0)
Hemoglobin: 7.1 g/dL — ABNORMAL LOW (ref 12.0–15.0)
MCH: 32.6 pg (ref 26.0–34.0)
MCHC: 32.4 g/dL (ref 30.0–36.0)
MCV: 100.5 fL — ABNORMAL HIGH (ref 80.0–100.0)
Platelets: 165 10*3/uL (ref 150–400)
RBC: 2.18 MIL/uL — ABNORMAL LOW (ref 3.87–5.11)
RDW: 22.4 % — ABNORMAL HIGH (ref 11.5–15.5)
WBC: 6.9 10*3/uL (ref 4.0–10.5)
nRBC: 0.6 % — ABNORMAL HIGH (ref 0.0–0.2)

## 2022-07-27 LAB — URINALYSIS, ROUTINE W REFLEX MICROSCOPIC
Bilirubin Urine: NEGATIVE
Glucose, UA: NEGATIVE mg/dL
Hgb urine dipstick: NEGATIVE
Ketones, ur: NEGATIVE mg/dL
Leukocytes,Ua: NEGATIVE
Nitrite: NEGATIVE
Protein, ur: NEGATIVE mg/dL
Specific Gravity, Urine: 1.011 (ref 1.005–1.030)
pH: 5 (ref 5.0–8.0)

## 2022-07-27 LAB — GLUCOSE, CAPILLARY
Glucose-Capillary: 101 mg/dL — ABNORMAL HIGH (ref 70–99)
Glucose-Capillary: 106 mg/dL — ABNORMAL HIGH (ref 70–99)
Glucose-Capillary: 110 mg/dL — ABNORMAL HIGH (ref 70–99)
Glucose-Capillary: 100 mg/dL — ABNORMAL HIGH (ref 70–99)
Glucose-Capillary: 88 mg/dL (ref 70–99)

## 2022-07-27 LAB — PREPARE RBC (CROSSMATCH)

## 2022-07-27 LAB — HEMOGLOBIN AND HEMATOCRIT, BLOOD
HCT: 24.4 % — ABNORMAL LOW (ref 36.0–46.0)
HCT: 27.5 % — ABNORMAL LOW (ref 36.0–46.0)
Hemoglobin: 7.8 g/dL — ABNORMAL LOW (ref 12.0–15.0)
Hemoglobin: 9.1 g/dL — ABNORMAL LOW (ref 12.0–15.0)

## 2022-07-27 SURGERY — ESOPHAGOGASTRODUODENOSCOPY (EGD) WITH PROPOFOL
Anesthesia: General

## 2022-07-27 MED ORDER — GABAPENTIN 300 MG PO CAPS
400.0000 mg | ORAL_CAPSULE | Freq: Two times a day (BID) | ORAL | Status: DC
  Administered 2022-07-29: 400 mg via ORAL
  Filled 2022-07-27: qty 1

## 2022-07-27 MED ORDER — PROPOFOL 10 MG/ML IV BOLUS
INTRAVENOUS | Status: AC
  Filled 2022-07-27: qty 40

## 2022-07-27 MED ORDER — PROPOFOL 10 MG/ML IV BOLUS
INTRAVENOUS | Status: DC | PRN
  Administered 2022-07-27: 40 mg via INTRAVENOUS
  Administered 2022-07-27 (×2): 20 mg via INTRAVENOUS

## 2022-07-27 MED ORDER — GABAPENTIN 300 MG PO CAPS
800.0000 mg | ORAL_CAPSULE | Freq: Every day | ORAL | Status: DC
  Administered 2022-07-27 – 2022-07-28 (×2): 800 mg via ORAL
  Filled 2022-07-27 (×2): qty 2

## 2022-07-27 MED ORDER — DEXMEDETOMIDINE HCL IN NACL 80 MCG/20ML IV SOLN
INTRAVENOUS | Status: DC | PRN
  Administered 2022-07-27 (×2): 4 ug via BUCCAL

## 2022-07-27 MED ORDER — POTASSIUM CHLORIDE 10 MEQ/100ML IV SOLN
10.0000 meq | INTRAVENOUS | Status: AC
  Administered 2022-07-27 (×2): 10 meq via INTRAVENOUS
  Filled 2022-07-27: qty 100

## 2022-07-27 MED ORDER — SODIUM CHLORIDE 0.9% IV SOLUTION: Freq: Once | INTRAVENOUS | Status: AC

## 2022-07-27 MED ORDER — PROPOFOL 500 MG/50ML IV EMUL
INTRAVENOUS | Status: DC | PRN
  Administered 2022-07-27: 60 ug/kg/min via INTRAVENOUS

## 2022-07-27 MED ORDER — DEXMEDETOMIDINE HCL IN NACL 80 MCG/20ML IV SOLN
INTRAVENOUS | Status: AC
  Filled 2022-07-27: qty 20

## 2022-07-27 MED ORDER — LIDOCAINE HCL (PF) 2 % IJ SOLN
INTRAMUSCULAR | Status: AC
  Filled 2022-07-27: qty 5

## 2022-07-27 MED ORDER — SODIUM CHLORIDE 0.9 % IV SOLN: INTRAVENOUS | Status: DC

## 2022-07-27 MED ORDER — BISACODYL 5 MG PO TBEC
10.0000 mg | DELAYED_RELEASE_TABLET | Freq: Once | ORAL | Status: AC
  Administered 2022-07-27: 10 mg via ORAL
  Filled 2022-07-27: qty 2

## 2022-07-27 MED ORDER — PEG 3350-KCL-NA BICARB-NACL 420 G PO SOLR
4000.0000 mL | Freq: Once | ORAL | Status: AC
  Administered 2022-07-27: 4000 mL via ORAL
  Filled 2022-07-27: qty 4000

## 2022-07-27 MED ORDER — PROPOFOL 10 MG/ML IV BOLUS
INTRAVENOUS | Status: AC
  Filled 2022-07-27: qty 20

## 2022-07-27 NOTE — Progress Notes (Addendum)
  Progress Note   Patient: Renee [name] [id number] DOB: 09/23/1950 DOA: 07/26/2022     0 DOS: the patient was seen and examined on 07/27/2022   Brief hospital course: Renee [name] is a 72 y.o. female with medical history significant of CAD s/p CABG, severe aortic stenosis, hypertension, type 2 diabetes, CKD stage III, who presents to the ED due to dark stool.  Hemoglobin dropped down to 6.5, she has received 2 units of blood transfusion. Seen by GI, EGD will be performed.   Principal Problem:   Melena Active Problems:   Acute blood loss anemia   Hypercalcemia   CKD (chronic kidney disease), stage III (HCC)   CAD in native artery   Type 2 diabetes mellitus with diabetic polyneuropathy, without long-term current use of insulin (HCC)   Essential hypertension   Severe aortic stenosis   Hypokalemia   COPD exacerbation (HCC)   Assessment and Plan: * Melena Acute blood loss anemia Patient received 2 units blood transfusion, the goal of hemoglobin is 8 due to severe aortic stenosis. Continue Protonix IV twice a day. Patient has been seen by GI, scheduled for EGD. Patient has not had a bowel movement today, GI bleeding seem to be improving. Patient has adequate iron level.  B12 level pending.  Severe aortic stenosis. Patient will be seen by cardiology, cleared for EGD.  Hypercalcemia Hypokalemia. Will discontinue hydrochlorothiazide.  PTH is pending. Give IV KCl, recheck level tomorrow.  CKD (chronic kidney disease), stage IIIa (HCC) Renal function stable.  CAD in native artery - Hold home aspirin in the setting of GI bleed  Type 2 diabetes mellitus with diabetic polyneuropathy, without long-term current use of insulin (HCC) Most recent A1c within goal at 6.2% Continue sliding scale insulin.  Essential hypertension - Continue home amlodipine, carvedilol Discontinue hydrochlorothiazide given hypercalcemia       Subjective:  Patient has no additional bowel  movement.  No nausea or vomiting.  Physical Exam: Vitals:   07/27/22 1032 07/27/22 1053 07/27/22 1203 07/27/22 1323  BP:  107/65 120/63 124/65  Pulse:  70 68 75  Resp: 18 18 18 18  $ Temp:  98.1 F (36.7 C) 98 F (36.7 C) 98.1 F (36.7 C)  TempSrc:  Oral Oral Oral  SpO2:  100% 100% 100%  Weight:      Height:       General exam: Appears calm and comfortable  Respiratory system: Clear to auscultation. Respiratory effort normal. Cardiovascular system: S1 & S2 heard, RRR. 2/6 SM. No pedal edema. Gastrointestinal system: Abdomen is nondistended, soft and nontender. No organomegaly or masses felt. Normal bowel sounds heard. Central nervous system: Alert and oriented. No focal neurological deficits. Extremities: Symmetric 5 x 5 power. Skin: No rashes, lesions or ulcers Psychiatry: Judgement and insight appear normal. Mood & affect appropriate.    Data Reviewed:  Lab results reviewed.  Family Communication: Son updated at bedside.  Disposition: Status is: Inpatient Remains inpatient appropriate because: Severity of disease, IV treatment, inpatient procedure.     Time spent: 35 minutes  Author: Sharen Hones, MD 07/27/2022 2:19 PM  For on call review [email or web address].

## 2022-07-27 NOTE — TOC CM/SW Note (Signed)
  Transition of Care [facility]) Screening Note   Patient Details  Name: Renee [name] Date of Birth: July 16, 1950   Transition of Care [facility]) CM/SW Contact:    Gerilyn Pilgrim, LCSW Phone Number: 07/27/2022, 10:12 AM    Transition of Care Department [facility]) has reviewed patient and no TOC needs have been identified at this time. We will continue to monitor patient advancement through interdisciplinary progression rounds. If new patient transition needs arise, please place a TOC consult.

## 2022-07-27 NOTE — Anesthesia Postprocedure Evaluation (Signed)
Anesthesia Post Note  Patient: Renee [name]  Procedure(s) Performed: ESOPHAGOGASTRODUODENOSCOPY (EGD) WITH PROPOFOL  Patient location during evaluation: Endoscopy Anesthesia Type: General Level of consciousness: awake and alert Pain management: pain level controlled Vital Signs Assessment: post-procedure vital signs reviewed and stable Respiratory status: spontaneous breathing, nonlabored ventilation, respiratory function stable and patient connected to nasal cannula oxygen Cardiovascular status: blood pressure returned to baseline and stable Postop Assessment: no apparent nausea or vomiting Anesthetic complications: no   No notable events documented.   Last Vitals:  Vitals:   07/27/22 1607 07/27/22 1656  BP: 116/66 112/66  Pulse:  69  Resp:  16  Temp:  36.8 C  SpO2:  100%    Last Pain:  Vitals:   07/27/22 1607  TempSrc:   PainSc: 0-No pain                 Precious Haws Martese Vanatta

## 2022-07-27 NOTE — Op Note (Signed)
[facility] Gastroenterology Patient Name: Renee [name] Procedure Date: 07/27/2022 3:27 PM MRN: [id number] Account #: [id number] Date of Birth: 01-09-1951 Admit Type: Inpatient Age: 72 Room: [facility] ENDO ROOM 2 Gender: Female Note Status: Finalized Instrument Name: Upper Endoscope [id number] Procedure:             Upper GI endoscopy Indications:           Acute post hemorrhagic anemia, Melena Providers:             Benay Pike. Alice Reichert MD, MD Referring MD:          Ramonita Lab, MD (Referring MD) Medicines:             Propofol per Anesthesia Complications:         No immediate complications. Procedure:             Pre-Anesthesia Assessment:                        - The risks and benefits of the procedure and the                         sedation options and risks were discussed with the                         patient. All questions were answered and informed                         consent was obtained.                        - Patient identification and proposed procedure were                         verified prior to the procedure by the nurse. The                         procedure was verified in the procedure room.                        - ASA Grade Assessment: III - A patient with severe                         systemic disease.                        - After reviewing the risks and benefits, the patient                         was deemed in satisfactory condition to undergo the                         procedure.                        After obtaining informed consent, the endoscope was                         passed under direct vision. Throughout the procedure,  the patient's blood pressure, pulse, and oxygen                         saturations were monitored continuously. The Endoscope                         was introduced through the mouth, and advanced to the                         third part of duodenum. The upper GI endoscopy was                          accomplished without difficulty. The patient tolerated                         the procedure well. Findings:      The esophagus was normal.      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 Biopsies were taken with a cold forceps for histology.      The cardia and gastric fundus were normal on retroflexion.      The examined duodenum was normal.      The exam was otherwise without abnormality. Impression:            - Normal esophagus.                        - Gastric antral vascular ectasia without bleeding.                         Biopsied.                        - Normal examined duodenum.                        - The examination was otherwise normal. Recommendation:        - Return patient to hospital ward for ongoing care.                        - Perform a colonoscopy tomorrow.                        - The findings and recommendations were discussed with                         the patient. Procedure Code(s):     --- Professional ---                        [phone], Esophagogastroduodenoscopy, flexible,                         transoral; with biopsy, single or multiple Diagnosis Code(s):     --- Professional ---                        K92.1, Melena (includes Hematochezia)                        D62, Acute posthemorrhagic anemia  K31.819, Angiodysplasia of stomach and duodenum                         without bleeding CPT copyright 2022 American Medical Association. All rights reserved. The codes documented in this report are preliminary and upon coder review may  be revised to meet current compliance requirements. Efrain Sella MD, MD 07/27/2022 3:47:50 PM This report has been signed electronically. Number of Addenda: 0 Note Initiated On: 07/27/2022 3:27 PM Estimated Blood Loss:  Estimated blood loss: none.      [facility]

## 2022-07-27 NOTE — Hospital Course (Signed)
Renee [name] is a 72 y.o. female with medical history significant of CAD s/p CABG, severe aortic stenosis, hypertension, type 2 diabetes, CKD stage III, who presents to the ED due to dark stool.  Hemoglobin dropped down to 6.5, she has received 2 units of blood transfusion. Seen by GI, EGD will be performed.

## 2022-07-27 NOTE — Interval H&P Note (Signed)
History and Physical Interval Note:  07/27/2022 3:28 PM  Renee [name]  has presented today for surgery, with the diagnosis of Melena, acute blood loss anemia, heme positive stool.  The various methods of treatment have been discussed with the patient and family. After consideration of risks, benefits and other options for treatment, the patient has consented to  Procedure(s): ESOPHAGOGASTRODUODENOSCOPY (EGD) WITH PROPOFOL (N/A) as a surgical intervention.  The patient's history has been reviewed, patient examined, no change in status, stable for surgery.  I have reviewed the patient's chart and labs.  Questions were answered to the patient's satisfaction.     [place], [place]

## 2022-07-27 NOTE — Consult Note (Signed)
[place] NOTE       Patient ID: Renee [name] MRN: [id number] DOB/AGE: June 10, 1950 72 y.o.  Admit date: 07/26/2022 Referring Physician Dr. Sharen Hones Primary Physician Dr. Ramonita Lab Primary Cardiologist Dr. Clayborn Bigness Reason for Consultation medical optimization prior to EGD  HPI: Renee [name] is a 65yoF with a PMH of severe aortic stenosis (AVA 0.73cm2, peak grad 73.71mHg, mean grad 35.549mg), CAD s/p CABG x 1 (LIMA-LAD 1996), DM2, CKD3, ongoing tobacco use who [facility] with dark stools, Hgb 5.4 on presentation c/f UGIB. Cardiology is consulted for medical optimization prior to EGD.   Patient states that she was "feeling badly" for the past several weeks with significant fatigue and intermittently dark stools.  Her family members were telling her to go get checked out but the patient put it off until this past week when she felt progressively more fatigued each day this week.  She has not had any shortness of breath, chest pain, peripheral edema, orthopnea or PND.  No lightheadedness or dizziness, presyncope or syncope.  She saw her regular cardiologist Dr. CaClayborn Bigness week ago where she was overall doing well from a cardiac perspective and continued surveillance of her aortic stenosis was recommended at a 6-58-monthterval.  Upon presentation to the emergency department her hemoglobin was markedly low at 5.4, and has received several units of PRBCs with increase in hemoglobin up to 7.8.  Labs are otherwise notable for BUN/creatinine of 26/1.02 and GFR of 58, slight hypokalemia with potassium 3.1.  She takes iron supplements once a week and has been compliant with her other medications including her Lasix daily.  Vitals are notable for a blood pressure of 120/63, she is in sinus rhythm with rate in the 60s to 70s on telemetry.  She is saturating 100% on room air.  EKG on presentation demonstrated sinus rhythm and nonspecific ST changes.  GI is  recommending upper endoscopy for further evaluation of her melena.  Review of systems complete and found to be negative unless listed above     Past Medical History:  Diagnosis Date   Arthritis    Chronic kidney disease    stage 3   Coronary artery disease    Diabetes mellitus without complication (HCC)    GERD (gastroesophageal reflux disease)    no meds   Heart murmur    Hypertension     Past Surgical History:  Procedure Laterality Date   ABDOMINAL HYSTERECTOMY     BREAST EXCISIONAL BIOPSY Right 20+ yrs ago   benign   CARDIAC SURGERY     cabg   CARPAL TUNNEL RELEASE Bilateral    COLONOSCOPY N/A 02/24/2017   Procedure: COLONOSCOPY;  Surgeon: [place], TeoBenay PikeD;  Location: [facility] ENDOSCOPY;  Service: Endoscopy;  Laterality: N/A;   CORONARY ARTERY BYPASS GRAFT     1996   HERNIA REPAIR     umbilical   LUMBAR LAMINECTOMY/DECOMPRESSION MICRODISCECTOMY N/A 06/25/2021   Procedure: L2-3 DECOMPRESSION;  Surgeon: YarMeade MawD;  Location: [facility] ORS;  Service: Neurosurgery;  Laterality: N/A;   TOTAL KNEE ARTHROPLASTY Left 10/25/2018   Procedure: TOTAL KNEE ARTHROPLASTY - LEFT - DIABETIC;  Surgeon: MenHessie KnowsD;  Location: [facility] ORS;  Service: Orthopedics;  Laterality: Left;    Medications Prior to Admission  Medication Sig Dispense Refill Last Dose   Alpha-Lipoic Acid 600 MG CAPS Take 600 mg by mouth daily.   07/26/2022   amLODipine (NORVASC) 5 MG tablet Take 5 mg by  mouth daily.   07/26/2022   aspirin EC 81 MG tablet Take 81 mg by mouth daily. Swallow whole.   07/26/2022   Azelastine HCl 137 MCG/SPRAY SOLN Place 1 spray into the nose 2 (two) times daily as needed (allergies).   unknown   carvedilol (COREG) 6.25 MG tablet Take 6.25 mg by mouth 2 (two) times a day.    07/26/2022 at 0800   Cholecalciferol (VITAMIN D3) 50 MCG (2000 UT) TABS Take 2,000 Units by mouth daily.   07/26/2022   CINNAMON PO Take 1,000 mg by mouth daily.   07/26/2022   [employer] SENNA 8.6 MG tablet TAKE 1  TABLET (8.6 MG TOTAL) BY MOUTH DAILY AS NEEDED FOR MILD CONSTIPATION. 30 tablet 0 unknown   EPINEPHrine 0.3 mg/0.3 mL IJ SOAJ injection Inject 0.3 mg into the muscle as needed for anaphylaxis.   unknown   Ferrous Sulfate (SLOW RELEASE IRON PO) Take 1 tablet by mouth once a week.   Past Week   furosemide (LASIX) 20 MG tablet Take 20 mg by mouth once a week. In the morning   Past Week   gabapentin (NEURONTIN) 800 MG tablet Take 800-1,600 mg by mouth See admin instructions. Take 1 tablet (800 mg) by mouth in the morning, 1 tablet (800 mg) by mouth at noon, & take 2 tablets (1600 mg) by mouth at bedtime.   07/26/2022   hydrochlorothiazide (HYDRODIURIL) 25 MG tablet Take 25 mg by mouth daily.   07/26/2022   loratadine (CLARITIN) 10 MG tablet Take 10 mg by mouth daily as needed for allergies.   unknown   metFORMIN (GLUCOPHAGE-XR) 500 MG 24 hr tablet Take 500 mg by mouth daily with breakfast.   07/26/2022   nitroGLYCERIN (NITROSTAT) 0.4 MG SL tablet Place 0.4 mg under the tongue every 5 (five) minutes as needed for chest pain.   unknown   Omega-3 Fatty Acids (FISH OIL) 1000 MG CAPS Take 1,000 mg by mouth 3 (three) times daily.   07/26/2022   potassium chloride SA (KLOR-CON M) 20 MEQ tablet Take 20 mEq by mouth daily.   07/26/2022   Propylene Glycol (SYSTANE BALANCE) 0.6 % SOLN Place 1 drop into both eyes daily as needed (dry eyes).   unknown   rosuvastatin (CRESTOR) 20 MG tablet Take 20 mg by mouth at bedtime.   07/25/2022   tiZANidine (ZANAFLEX) 2 MG tablet TAKE 1 TABLET BY MOUTH 3 TIMES DAILY. 90 tablet 0 unknown   vitamin C (ASCORBIC ACID) 500 MG tablet Take 500 mg by mouth daily.   07/26/2022   DULoxetine (CYMBALTA) 30 MG capsule Take 90 mg by mouth daily. (Patient not taking: Reported on 07/26/2022)   Not Taking   potassium chloride (KLOR-CON) 10 MEQ tablet Take 10 mEq by mouth daily. (Patient not taking: Reported on 07/26/2022)   Not Taking   Social History   Socioeconomic History   Marital status:  Widowed    Spouse name: Not on file   Number of children: Not on file   Years of education: Not on file   Highest education level: Not on file  Occupational History   Not on file  Tobacco Use   Smoking status: Every Day    Packs/day: 0.25    Years: 35.00    Total pack years: 8.75    Types: Cigarettes   Smokeless tobacco: Never  Vaping Use   Vaping Use: Never used  Substance and Sexual Activity   Alcohol use: Yes   Drug use: No  Sexual activity: Not on file  Other Topics Concern   Not on file  Social History Narrative   Son lives with her. Spouse died 2019/06/05   Social Determinants of Health   Financial Resource Strain: Not on file  Food Insecurity: No Food Insecurity (07/26/2022)   Hunger Vital Sign    Worried About Running Out of Food in the Last Year: Never true    Ran Out of Food in the Last Year: Never true  Transportation Needs: No Transportation Needs (07/26/2022)   PRAPARE - Hydrologist (Medical): No    Lack of Transportation (Non-Medical): No  Physical Activity: Not on file  Stress: Not on file  Social Connections: Not on file  Intimate Partner Violence: Not At Risk (07/26/2022)   Humiliation, Afraid, Rape, and Kick questionnaire    Fear of Current or Ex-Partner: No    Emotionally Abused: No    Physically Abused: No    Sexually Abused: No    Family History  Problem Relation Age of Onset   Breast cancer Neg Hx       Intake/Output Summary (Last 24 hours) at 07/27/2022 0832 Last data filed at 07/27/2022 0544 Gross per 24 hour  Intake 2449 ml  Output --  Net 2449 ml    Vitals:   07/26/22 2202 07/27/22 0038 07/27/22 0613 07/27/22 0803  BP: (!) 90/50 (!) 105/54 115/64 115/69  Pulse: 81 81 76 74  Resp: '18 17 16 18  '$ Temp: 97.9 F (36.6 C) 98.2 F (36.8 C) 98.3 F (36.8 C) 98.7 F (37.1 C)  TempSrc: Oral Oral    SpO2: 100% 100% 100% 100%  Weight:      Height:        PHYSICAL EXAM General: Pleasant elderly black  female, well nourished, in no acute distress. HEENT:  Normocephalic and atraumatic. Neck:  No JVD.  Lungs: Normal respiratory effort on room air.  Decreased breath sounds without wheezes, crackles, rhonchi.  Heart: HRRR . Normal S1 and absent S2 + 4/6 systolic murmur best heard at RUSB + radiation to R carotid   Abdomen: Non-distended appearing.  Msk: Normal strength and tone for age. Extremities: Warm and well perfused. No clubbing, cyanosis.  No peripheral edema.  Neuro: Alert and oriented X 3. Psych:  Answers questions appropriately.   Labs: Basic Metabolic Panel: Recent Labs    07/26/22 1355 07/26/22 1500 07/27/22 0312  NA  --  136 137  K  --  3.4* 3.1*  CL  --  99 102  CO2  --  24 26  GLUCOSE  --  112* 100*  BUN  --  29* 26*  CREATININE  --  1.04* 1.02*  CALCIUM  --  12.9* 11.3*  MG 1.8  --   --   PHOS 4.8*  --   --    Liver Function Tests: Recent Labs    07/26/22 1203 07/27/22 0312  AST 22 16  ALT 15 13  ALKPHOS 40 36*  BILITOT 0.5 0.6  PROT 6.5 5.4*  ALBUMIN 3.6 3.1*   No results for input(s): "LIPASE", "AMYLASE" in the last 72 hours. CBC: Recent Labs    07/26/22 1203 07/26/22 2244 07/27/22 0312  WBC 9.6 8.1 6.9  NEUTROABS 6.1  --   --   HGB 5.4* 6.5* 7.1*  HCT 17.4* 20.3* 21.9*  MCV 116.0* 103.6* 100.5*  PLT 207 169 165   Cardiac Enzymes: No results for input(s): "CKTOTAL", "CKMB", "CKMBINDEX", "TROPONINIHS" in the  last 72 hours. BNP: No results for input(s): "BNP" in the last 72 hours. D-Dimer: No results for input(s): "DDIMER" in the last 72 hours. Hemoglobin A1C: Recent Labs    07/26/22 1203  HGBA1C 4.5*   Fasting Lipid Panel: No results for input(s): "CHOL", "HDL", "LDLCALC", "TRIG", "CHOLHDL", "LDLDIRECT" in the last 72 hours. Thyroid Function Tests: No results for input(s): "TSH", "T4TOTAL", "T3FREE", "THYROIDAB" in the last 72 hours.  Invalid input(s): "FREET3" Anemia Panel: Recent Labs    07/26/22 1422  FERRITIN 11  TIBC  469*  IRON 58     Radiology: No results found.  ECHO 03/11/2022 DOPPLER ECHO and OTHER SPECIAL PROCEDURES                 Aortic: MILD AR                    SEVERE AS                         428.0 cm/sec peak vel      73.3 mmHg peak grad                         35.5 mmHg mean grad                 Mitral: MILD MR                    No MS                         MV Inflow E Vel = 89.6 cm/sec     MV Annulus E'Vel = 5.9 cm/sec                         E/E'Ratio = 15.2              Tricuspid: MILD TR                    No TS                         253.0 cm/sec peak TR vel   28.6 mmHg peak RV pressure              Pulmonary: TRIVIAL PR                 No PS                         83.4 cm/sec peak vel       2.8 mmHg peak grad  _________________________________________________________________________________________  INTERPRETATION  NORMAL LEFT VENTRICULAR SYSTOLIC FUNCTION   WITH MILD LVH  NORMAL RIGHT VENTRICULAR SYSTOLIC FUNCTION  MILD VALVULAR REGURGITATION (See above)  MODERATE VALVULAR STENOSIS (See above)  AVA(VTI)= .73cm^2  MILD MR, TR, AR  MODERATE to SEVERE AS  TRIVIAL PR  EF >55%   TELEMETRY reviewed by me (LT) 07/27/2022 : Sinus rhythm rate 70s to 80s with occasional premature atrial contractions.  EKG reviewed by me: NSR 86 with LVH, nonspecific ST changes/repol abnormality   Data reviewed by me (LT) 07/27/2022: last cardiology clinic note, ed note, admission H&P, last 24hr labs, imaging vitals tele    Principal Problem:   Melena Active Problems:   CAD in native artery   Essential hypertension  Type 2 diabetes mellitus with diabetic polyneuropathy, without long-term current use of insulin (HCC)   Acute blood loss anemia   Hypercalcemia   CKD (chronic kidney disease), stage III (HCC)   Hypokalemia    ASSESSMENT AND PLAN:  Renee [name] is a 65yoF with a PMH of severe aortic stenosis (AVA 0.73cm2, peak grad 73.30mHg, mean grad 35.558mg), CAD s/p CABG x 1 (LIMA-LAD  1996), DM2, CKD3, ongoing tobacco use who [facility] with dark stools, Hgb 5.4 on presentation c/f UGIB. Cardiology is consulted for medical optimization prior to EGD.   # acute blood loss anemia # melena # medical optimization prior to EGD # severe aortic stenosis Presents with several week hx of progressive fatigue and intermittent melena, worsening over the past several days + daily melena. Hgb 5.4 on presentation -- 7.8 after transfusions. Has known severe AS for which surveillance echo at a 6 month interval is recommended. Query if her anemia is related to AVMs / Heyde's syndrome. EKG with nonspecific ST changes and LVH but without chest pain. No active heart failure symptoms of shortness of breath, orthopnea, or PND. Appears clinically euvolemic, laying flat in bed on room air during interview.  - agree with current therapy per primary & GI - recommend continuing carvedilol pre, peri, and postprocedurally  - ok to transfuse another unit of PRBCs to get Hgb up close to or >8.0 - ok to hold aspirin for now, restart when ok from a GI perspective - no further cardiac diagnostics recommended prior to proceeding with EGD. She is an acceptable mild - moderate risk from a cardiac standpoint without modifiable risk factors prior to procedure.  - will arrange for follow up with Dr. CaClayborn Bignessollowing discharge  Cardiology will sign off. Please haiku with questions or re-engage if needed.    This patient's plan of care was discussed and created with Dr. PaSaralyn Pilarnd he is in agreement.  Signed: LiTristan Schroeder PA-C 07/27/2022, 8:32 AM [facility]

## 2022-07-27 NOTE — Anesthesia Preprocedure Evaluation (Addendum)
Anesthesia Evaluation  Patient identified by MRN, date of birth, ID band Patient awake    Reviewed: Allergy & Precautions, NPO status , Patient's Chart, lab work & pertinent test results  History of Anesthesia Complications Negative for: history of anesthetic complications  Airway Mallampati: III  TM Distance: >3 FB Neck ROM: full    Dental  (+) Chipped, Poor Dentition, Missing   Pulmonary shortness of breath and with exertion, COPD, Current Smoker   Pulmonary exam normal        Cardiovascular Exercise Tolerance: Good hypertension, (-) angina + CAD (s/p CABG)  + Valvular Problems/Murmurs (severe AS) AS  + Systolic murmurs ECG [id number]: Sinus rhythm LVH with secondary repolarization abnormality  Echo 03/10/22:  NORMAL LEFT VENTRICULAR SYSTOLIC FUNCTION WITH MILD LVH NORMAL RIGHT VENTRICULAR SYSTOLIC FUNCTION MILD VALVULAR REGURGITATION  MODERATE VALVULAR STENOSIS  AVA(VTI)= .73cm^2 MILD MR, TR, AR MODERATE to SEVERE AS TRIVIAL PR EF >55%    Neuro/Psych  Neuromuscular disease (diabetic polyneuropathy) negative neurological ROS  negative psych ROS   GI/Hepatic Neg liver ROS,GERD  Controlled,,  Endo/Other  diabetes, Type 2    Renal/GU Renal disease (stage III CKD)  negative genitourinary   Musculoskeletal  (+) Arthritis ,    Abdominal   Peds  Hematology negative hematology ROS (+)   Anesthesia Other Findings Secure chat with Orinda Kenner, PA-C 07/27/22: discussed with Dr. Saralyn Pilar, she is at mild-moderate acceptable risk for EGD without any active heart failure symptoms needing management prior to proceeding. No additional cardiac diagnostics necessary prior to proceeding with EGD. Will arrange for follow up with [place] in clinic after discharge.    Cardiology note 07/20/22:  Aortic stenosis, moderate to severe, I reviewed possible treatment with patient. Consider further treatment.  Chronic renal insufficiency stage  III, recommend hydration and follow-up with nephrology Heart murmur, I reviewed echo which showed moderate to severe aortic stenosis, patient is asymptomatic other than heart murmur, I went over the options for possible treatment CAD, S/P PCI, CABG single-vessel,stable, continue current medical therapy with amlodipine, aspirin 81 mg, carvedilol, HCTZ, rosuvastatin, and nitro as needed for chest pain.  Hypertension, patient is hypertensive today, BP today is 114/72, continue amlodipine, HCTZ, and Coreg Type 2 DM, uncomplicated, continue metformin, management per PCP DOE, SOB, possible related to poor conditioning h/o smoking consider inhalers recommend routine aerobic exercise Hyperlipidemia, chronic, continue Crestor therapy for lipid management Spinal stenosis, lumbar region, consider follow up with orthopaedics.  Have patient follow up in 6 months    Reproductive/Obstetrics negative OB ROS                              Anesthesia Physical Anesthesia Plan  ASA: 4  Anesthesia Plan: General   Post-op Pain Management:    Induction: Intravenous  PONV Risk Score and Plan: Propofol infusion and TIVA  Airway Management Planned: Natural Airway and Nasal Cannula  Additional Equipment:   Intra-op Plan:   Post-operative Plan:   Informed Consent: I have reviewed the patients History and Physical, chart, labs and discussed the procedure including the risks, benefits and alternatives for the proposed anesthesia with the patient or authorized representative who has indicated his/her understanding and acceptance.     Dental Advisory Given  Plan Discussed with: Anesthesiologist, CRNA and Surgeon  Anesthesia Plan Comments: (Patient consented for risks of anesthesia including but not limited to:  - adverse reactions to medications - risk of airway placement if required - damage to  eyes, teeth, lips or other oral mucosa - nerve damage due to positioning  - sore  throat or hoarseness - Damage to heart, brain, nerves, lungs, other parts of body or loss of life  Patient voiced understanding.)         Anesthesia Quick Evaluation

## 2022-07-27 NOTE — Transfer of Care (Signed)
Immediate Anesthesia Transfer of Care Note  Patient: Renee [name]  Procedure(s) Performed: ESOPHAGOGASTRODUODENOSCOPY (EGD) WITH PROPOFOL  Patient Location: Endoscopy Unit  Anesthesia Type:General  Level of Consciousness: sedated  Airway & Oxygen Therapy: Patient Spontanous Breathing and Patient connected to nasal cannula oxygen  Post-op Assessment: Report given to RN and Post -op Vital signs reviewed and stable  Post vital signs: Reviewed and stable  Last Vitals:  Vitals Value Taken Time  BP 114/58 07/27/22 1559  Temp 36.8 C 07/27/22 1547  Pulse 71 07/27/22 1605  Resp 15 07/27/22 1605  SpO2 100 % 07/27/22 1605  Vitals shown include unvalidated device data.  Last Pain:  Vitals:   07/27/22 1557  TempSrc:   PainSc: 0-No pain         Complications: No notable events documented.

## 2022-07-28 ENCOUNTER — Inpatient Hospital Stay: Payer: No Typology Code available for payment source | Admitting: Anesthesiology

## 2022-07-28 ENCOUNTER — Encounter: Payer: Self-pay | Admitting: Internal Medicine

## 2022-07-28 ENCOUNTER — Encounter
Admission: EM | Disposition: A | Discharge status: Home / Self Care | Payer: Self-pay | Source: Home / Self Care | Attending: Internal Medicine

## 2022-07-28 DIAGNOSIS — I350 Nonrheumatic aortic (valve) stenosis: Secondary | ICD-10-CM | POA: Diagnosis not present

## 2022-07-28 DIAGNOSIS — K921 Melena: Secondary | ICD-10-CM | POA: Diagnosis not present

## 2022-07-28 DIAGNOSIS — I129 Hypertensive chronic kidney disease with stage 1 through stage 4 chronic kidney disease, or unspecified chronic kidney disease: Secondary | ICD-10-CM | POA: Diagnosis not present

## 2022-07-28 DIAGNOSIS — D62 Acute posthemorrhagic anemia: Secondary | ICD-10-CM | POA: Diagnosis not present

## 2022-07-28 DIAGNOSIS — N1831 Chronic kidney disease, stage 3a: Secondary | ICD-10-CM | POA: Diagnosis not present

## 2022-07-28 DIAGNOSIS — K640 First degree hemorrhoids: Secondary | ICD-10-CM | POA: Diagnosis not present

## 2022-07-28 DIAGNOSIS — K625 Hemorrhage of anus and rectum: Secondary | ICD-10-CM | POA: Diagnosis not present

## 2022-07-28 DIAGNOSIS — F17210 Nicotine dependence, cigarettes, uncomplicated: Secondary | ICD-10-CM | POA: Diagnosis not present

## 2022-07-28 DIAGNOSIS — K649 Unspecified hemorrhoids: Secondary | ICD-10-CM | POA: Diagnosis not present

## 2022-07-28 HISTORY — PX: COLONOSCOPY: SHX5424

## 2022-07-28 LAB — TYPE AND SCREEN
ABO/RH(D): O POS
Antibody Screen: NEGATIVE
Unit division: 0
Unit division: 0
Unit division: 0

## 2022-07-28 LAB — BPAM RBC
Blood Product Expiration Date: 202403192359
Blood Product Expiration Date: 202403192359
Blood Product Expiration Date: 202403192359
ISSUE DATE / TIME: 202402181622
ISSUE DATE / TIME: 202402182106
ISSUE DATE / TIME: 202402191026
Unit Type and Rh: 5100
Unit Type and Rh: 5100
Unit Type and Rh: 5100

## 2022-07-28 LAB — BASIC METABOLIC PANEL
Anion gap: 7 (ref 5–15)
BUN: 18 mg/dL (ref 8–23)
CO2: 25 mmol/L (ref 22–32)
Calcium: 10 mg/dL (ref 8.9–10.3)
Chloride: 110 mmol/L (ref 98–111)
Creatinine, Ser: 0.87 mg/dL (ref 0.44–1.00)
GFR, Estimated: >60 mL/min (ref >60)
Glucose, Bld: 91 mg/dL (ref 70–99)
Potassium: 3.1 mmol/L — ABNORMAL LOW (ref 3.5–5.1)
Sodium: 142 mmol/L (ref 135–145)

## 2022-07-28 LAB — CBC
HCT: 24.8 % — ABNORMAL LOW (ref 36.0–46.0)
Hemoglobin: 8.1 g/dL — ABNORMAL LOW (ref 12.0–15.0)
MCH: 31.6 pg (ref 26.0–34.0)
MCHC: 32.7 g/dL (ref 30.0–36.0)
MCV: 96.9 fL (ref 80.0–100.0)
Platelets: 150 10*3/uL (ref 150–400)
RBC: 2.56 MIL/uL — ABNORMAL LOW (ref 3.87–5.11)
RDW: 23.1 % — ABNORMAL HIGH (ref 11.5–15.5)
WBC: 5.6 10*3/uL (ref 4.0–10.5)
nRBC: 0.4 % — ABNORMAL HIGH (ref 0.0–0.2)

## 2022-07-28 LAB — MAGNESIUM: Magnesium: 1.5 mg/dL — ABNORMAL LOW (ref 1.7–2.4)

## 2022-07-28 LAB — CALCIUM, IONIZED: Calcium, Ionized, Serum: 7.4 mg/dL — ABNORMAL HIGH (ref 4.5–5.6)

## 2022-07-28 LAB — VITAMIN B12: Vitamin B-12: 649 pg/mL (ref 180–914)

## 2022-07-28 LAB — PARATHYROID HORMONE, INTACT (NO CA): PTH: 10 pg/mL — ABNORMAL LOW (ref 15–65)

## 2022-07-28 LAB — GLUCOSE, CAPILLARY
Glucose-Capillary: 93 mg/dL (ref 70–99)
Glucose-Capillary: 98 mg/dL (ref 70–99)
Glucose-Capillary: 122 mg/dL — ABNORMAL HIGH (ref 70–99)
Glucose-Capillary: 103 mg/dL — ABNORMAL HIGH (ref 70–99)

## 2022-07-28 SURGERY — COLONOSCOPY
Anesthesia: General

## 2022-07-28 MED ORDER — PANTOPRAZOLE SODIUM 40 MG PO TBEC
40.0000 mg | DELAYED_RELEASE_TABLET | Freq: Every day | ORAL | Status: DC
  Administered 2022-07-29: 40 mg via ORAL
  Filled 2022-07-28: qty 1

## 2022-07-28 MED ORDER — PHENYLEPHRINE 80 MCG/ML (10ML) SYRINGE FOR IV PUSH (FOR BLOOD PRESSURE SUPPORT)
PREFILLED_SYRINGE | INTRAVENOUS | Status: AC
  Filled 2022-07-28: qty 10

## 2022-07-28 MED ORDER — POTASSIUM CHLORIDE 10 MEQ/100ML IV SOLN
10.0000 meq | INTRAVENOUS | Status: AC
  Administered 2022-07-28 (×4): 10 meq via INTRAVENOUS
  Filled 2022-07-28: qty 100

## 2022-07-28 MED ORDER — PROPOFOL 10 MG/ML IV BOLUS
INTRAVENOUS | Status: DC | PRN
  Administered 2022-07-28: 150 ug/kg/min via INTRAVENOUS
  Administered 2022-07-28: 80 mg via INTRAVENOUS

## 2022-07-28 MED ORDER — LIDOCAINE HCL (PF) 2 % IJ SOLN
INTRAMUSCULAR | Status: AC
  Filled 2022-07-28: qty 5

## 2022-07-28 MED ORDER — POLYSACCHARIDE IRON COMPLEX 150 MG PO CAPS
150.0000 mg | ORAL_CAPSULE | Freq: Every day | ORAL | Status: DC
  Administered 2022-07-28 – 2022-07-29 (×2): 150 mg via ORAL
  Filled 2022-07-28 (×2): qty 1

## 2022-07-28 MED ORDER — SODIUM CHLORIDE 0.9 % IV SOLN: INTRAVENOUS | Status: DC

## 2022-07-28 MED ORDER — PROPOFOL 10 MG/ML IV BOLUS
INTRAVENOUS | Status: AC
  Filled 2022-07-28: qty 40

## 2022-07-28 MED ORDER — MAGNESIUM SULFATE 4 GM/100ML IV SOLN
4.0000 g | Freq: Once | INTRAVENOUS | Status: AC
  Administered 2022-07-28: 4 g via INTRAVENOUS
  Filled 2022-07-28: qty 100

## 2022-07-28 MED ORDER — PROPOFOL 10 MG/ML IV BOLUS
INTRAVENOUS | Status: AC
  Filled 2022-07-28: qty 20

## 2022-07-28 MED ORDER — PHENYLEPHRINE HCL (PRESSORS) 10 MG/ML IV SOLN
INTRAVENOUS | Status: DC | PRN
  Administered 2022-07-28 (×2): 80 ug via INTRAVENOUS

## 2022-07-28 MED ORDER — LIDOCAINE HCL (CARDIAC) PF 100 MG/5ML IV SOSY
PREFILLED_SYRINGE | INTRAVENOUS | Status: DC | PRN
  Administered 2022-07-28: 100 mg via INTRAVENOUS

## 2022-07-28 NOTE — Op Note (Signed)
[facility] Gastroenterology Patient Name: Renee [name] Procedure Date: 07/28/2022 12:47 PM MRN: [id number] Account #: [id number] Date of Birth: 1950-07-09 Admit Type: Inpatient Age: 72 Room: [facility] ENDO ROOM 1 Gender: Female Note Status: Finalized Instrument Name: Park Meo [id number] Procedure:             Colonoscopy Indications:           Rectal bleeding, Acute post hemorrhagic anemia Providers:             Benay Pike. Alice Reichert MD, MD Referring MD:          Ramonita Lab, MD (Referring MD) Medicines:             Propofol per Anesthesia Complications:         No immediate complications. Procedure:             Pre-Anesthesia Assessment:                        - Prior to the procedure, a History and Physical was                         performed, and patient medications, allergies and                         sensitivities were reviewed. The patient's tolerance                         of previous anesthesia was reviewed.                        - The risks and benefits of the procedure and the                         sedation options and risks were discussed with the                         patient. All questions were answered and informed                         consent was obtained.                        - Patient identification and proposed procedure were                         verified prior to the procedure by the nurse. The                         procedure was verified in the procedure room.                        - ASA Grade Assessment: III - A patient with severe                         systemic disease.                        - After reviewing the risks and benefits, the patient  was deemed in satisfactory condition to undergo the                         procedure.                        After obtaining informed consent, the colonoscope was                         passed under direct vision. Throughout the procedure,                          the patient's blood pressure, pulse, and oxygen                         saturations were monitored continuously. The                         Colonoscope was introduced through the anus and                         advanced to the the cecum, identified by appendiceal                         orifice and ileocecal valve. The colonoscopy was                         somewhat difficult due to significant looping.                         Successful completion of the procedure was aided by                         applying abdominal pressure. The patient tolerated the                         procedure well. The quality of the bowel preparation                         was good. The ileocecal valve, appendiceal orifice,                         and rectum were photographed. Findings:      The perianal and digital rectal examinations were normal. Pertinent       negatives include normal sphincter tone and no palpable rectal lesions.      Non-bleeding internal hemorrhoids were found during retroflexion. The       hemorrhoids were Grade I (internal hemorrhoids that do not prolapse).      The colon (entire examined portion) appeared normal.      There is no endoscopic evidence of bleeding, diverticula, erythema,       inflammation, mass or polyps in the entire colon. Impression:            - Non-bleeding internal hemorrhoids.                        - The entire examined colon is normal.                        -  No specimens collected. Recommendation:        - Return patient to hospital ward for possible                         discharge same day.                        - Advance diet as tolerated.                        - Continue present medications.                        - Return to GI office PRN.                        - The findings and recommendations were discussed with                         the patient.                        - [employer] will sign off for now. Call us back if any                          changes clinically or if we can help for any reason. Procedure Code(s):     --- Professional ---                        [phone], Colonoscopy, flexible; diagnostic, including                         collection of specimen(s) by brushing or washing, when                         performed (separate procedure) Diagnosis Code(s):     --- Professional ---                        D62, Acute posthemorrhagic anemia                        K62.5, Hemorrhage of anus and rectum                        K64.0, First degree hemorrhoids CPT copyright 2022 American Medical Association. All rights reserved. The codes documented in this report are preliminary and upon coder review may  be revised to meet current compliance requirements. Efrain Sella MD, MD 07/28/2022 1:35:44 PM This report has been signed electronically. Number of Addenda: 0 Note Initiated On: 07/28/2022 12:47 PM Scope Withdrawal Time: 0 hours 5 minutes 14 seconds  Total Procedure Duration: 0 hours 17 minutes 9 seconds  Estimated Blood Loss:  Estimated blood loss: none.      [facility]

## 2022-07-28 NOTE — Transfer of Care (Signed)
Immediate Anesthesia Transfer of Care Note  Patient: Renee [name]  Procedure(s) Performed: COLONOSCOPY  Patient Location: PACU  Anesthesia Type:General  Level of Consciousness: drowsy  Airway & Oxygen Therapy: Patient Spontanous Breathing  Post-op Assessment: Report given to RN and Post -op Vital signs reviewed and stable  Post vital signs: Reviewed and stable  Last Vitals:  Vitals Value Taken Time  BP 102/73 1336  Temp 35.9 1336  Pulse 74 1336  Resp 16 1336  SpO2 98 1336    Last Pain:  Vitals:   07/28/22 1256  TempSrc: Temporal  PainSc: 0-No pain         Complications: No notable events documented.

## 2022-07-28 NOTE — Interval H&P Note (Signed)
History and Physical Interval Note:  07/28/2022 1:07 PM  Renee [name]  has presented today for surgery, with the diagnosis of Anemia, melena.  The various methods of treatment have been discussed with the patient and family. After consideration of risks, benefits and other options for treatment, the patient has consented to  Procedure(s): COLONOSCOPY (N/A) as a surgical intervention.  The patient's history has been reviewed, patient examined, no change in status, stable for surgery.  I have reviewed the patient's chart and labs.  Questions were answered to the patient's satisfaction.     [place], [place]

## 2022-07-28 NOTE — Anesthesia Preprocedure Evaluation (Addendum)
Anesthesia Evaluation  Patient identified by MRN, date of birth, ID band Patient awake    Reviewed: Allergy & Precautions, NPO status , Patient's Chart, lab work & pertinent test results  History of Anesthesia Complications Negative for: history of anesthetic complications  Airway Mallampati: III  TM Distance: >3 FB Neck ROM: full    Dental  (+) Chipped, Poor Dentition, Missing   Pulmonary shortness of breath and with exertion, COPD, Current Smoker   Pulmonary exam normal        Cardiovascular Exercise Tolerance: Good hypertension, (-) angina + CAD (s/p CABG) and + CABG  + Valvular Problems/Murmurs (severe AS) AS  + Systolic murmurs ECG [id number]: Sinus rhythm LVH with secondary repolarization abnormality  Echo 03/10/22:  NORMAL LEFT VENTRICULAR SYSTOLIC FUNCTION WITH MILD LVH NORMAL RIGHT VENTRICULAR SYSTOLIC FUNCTION MILD VALVULAR REGURGITATION  MODERATE VALVULAR STENOSIS  AVA(VTI)= .73cm^2 MILD MR, TR, AR MODERATE to SEVERE AS TRIVIAL PR EF >55%    Neuro/Psych  Neuromuscular disease (diabetic polyneuropathy) negative neurological ROS  negative psych ROS   GI/Hepatic Neg liver ROS,GERD  Controlled,,  Endo/Other  diabetes, Type 2    Renal/GU Renal disease (stage III CKD)  negative genitourinary   Musculoskeletal  (+) Arthritis ,    Abdominal   Peds  Hematology  (+) Blood dyscrasia, anemia   Anesthesia Other Findings Melena Acute blood loss anemia   Secure chat with Orinda Kenner, PA-C 07/27/22: discussed with Dr. Saralyn Pilar, she is at mild-moderate acceptable risk for EGD without any active heart failure symptoms needing management prior to proceeding. No additional cardiac diagnostics necessary prior to proceeding with EGD. Will arrange for follow up with [place] in clinic after discharge.    Cardiology note 07/20/22:  Aortic stenosis, moderate to severe, I reviewed possible treatment with patient. Consider further  treatment.  Chronic renal insufficiency stage III, recommend hydration and follow-up with nephrology Heart murmur, I reviewed echo which showed moderate to severe aortic stenosis, patient is asymptomatic other than heart murmur, I went over the options for possible treatment CAD, S/P PCI, CABG single-vessel,stable, continue current medical therapy with amlodipine, aspirin 81 mg, carvedilol, HCTZ, rosuvastatin, and nitro as needed for chest pain.  Hypertension, patient is hypertensive today, BP today is 114/72, continue amlodipine, HCTZ, and Coreg Type 2 DM, uncomplicated, continue metformin, management per PCP DOE, SOB, possible related to poor conditioning h/o smoking consider inhalers recommend routine aerobic exercise Hyperlipidemia, chronic, continue Crestor therapy for lipid management Spinal stenosis, lumbar region, consider follow up with orthopaedics.  Have patient follow up in 6 months    Reproductive/Obstetrics negative OB ROS                              Anesthesia Physical Anesthesia Plan  ASA: 4  Anesthesia Plan: General   Post-op Pain Management:    Induction: Intravenous  PONV Risk Score and Plan: Propofol infusion and TIVA  Airway Management Planned: Natural Airway and Nasal Cannula  Additional Equipment:   Intra-op Plan:   Post-operative Plan:   Informed Consent:      Dental Advisory Given  Plan Discussed with: Anesthesiologist, CRNA and Surgeon  Anesthesia Plan Comments:          Anesthesia Quick Evaluation

## 2022-07-28 NOTE — Progress Notes (Addendum)
  Progress Note   Patient: Renee [name] [id number] DOB: [birth year] DOA: 07/26/2022     1 DOS: the patient was seen and examined on 07/28/2022   Brief hospital course: CHANTEL NORR is a 72 y.o. female with medical history significant of CAD s/p CABG, severe aortic stenosis, hypertension, type 2 diabetes, CKD stage III, who presents to the ED due to dark stool.  Hemoglobin dropped down to 6.5, she has received 2 units of blood transfusion. Seen by GI, EGD will be performed.   Principal Problem:   Melena Active Problems:   Acute blood loss anemia   Hypercalcemia   Stage 3a chronic kidney disease (CKD) (HCC)   CAD in native artery   Type 2 diabetes mellitus with diabetic polyneuropathy, without long-term current use of insulin (HCC)   Essential hypertension   Severe aortic stenosis   Hypokalemia   COPD exacerbation (HCC)   Overweight (BMI 25.0-29.9)   Hypomagnesemia COPD exacerbation ruled out. Patient does not have COPD  Assessment and Plan:   Melena Acute blood loss anemia Patient received 2 units blood transfusion, the goal of hemoglobin is 8 due to severe aortic stenosis. EGD did not show any significant GI bleed.  Scheduled for colonoscopy today. Currently has been stable, B12 level pending.   Severe aortic stenosis. Patient will be seen by cardiology, currently patient does not have a congestive heart failure.   Hypercalcemia Hypokalemia. Hypomagnesemia. Will discontinue hydrochlorothiazide.  PTH is pending. Replete potassium and magnesium.   CKD (chronic kidney disease), stage IIIa (HCC) Renal function stable.   CAD in native artery - Hold home aspirin in the setting of GI bleed   Type 2 diabetes mellitus with diabetic polyneuropathy, without long-term current use of insulin (HCC) Most recent A1c within goal at 6.2% Continue sliding scale insulin.   Essential hypertension - Continue home amlodipine, carvedilol Discontinue hydrochlorothiazide given  hypercalcemia       Subjective:  Patient doing well, had multiple loose stools last night for bowel prep.  No additional black stool overnight.  Physical Exam: Vitals:   07/27/22 2208 07/28/22 0114 07/28/22 0506 07/28/22 0750  BP: 139/66 121/70 (!) 114/59 123/77  Pulse: 63 77 71 74  Resp: 18 18 17 18  $ Temp: 97.8 F (36.6 C) 98 F (36.7 C) 97.9 F (36.6 C) 98.6 F (37 C)  TempSrc: Oral Oral Oral   SpO2: 100% 100% 100% 100%  Weight:      Height:       General exam: Appears calm and comfortable  Respiratory system: Clear to auscultation. Respiratory effort normal. Cardiovascular system: S1 & S2 heard, RRR. No JVD, murmurs, rubs, gallops or clicks. No pedal edema. Gastrointestinal system: Abdomen is nondistended, soft and nontender. No organomegaly or masses felt. Normal bowel sounds heard. Central nervous system: Alert and oriented. No focal neurological deficits. Extremities: Symmetric 5 x 5 power. Skin: No rashes, lesions or ulcers Psychiatry: Judgement and insight appear normal. Mood & affect appropriate.    Data Reviewed:  Lab results reviewed.  Family Communication: Patient requested not to talk to family member.  Disposition: Status is: Inpatient Remains inpatient appropriate because: Severity of disease, IV treatment, inpatient procedure.     Time spent: 35 minutes  Author: Sharen Hones, MD 07/28/2022 12:32 PM  For on call review [email or web address].

## 2022-07-28 NOTE — Care Plan (Signed)
Patient s/p procedure tolerated well Report phoned to RN Report printed in Chart MD into talk with patient

## 2022-07-29 ENCOUNTER — Encounter: Payer: Self-pay | Admitting: Internal Medicine

## 2022-07-29 DIAGNOSIS — D62 Acute posthemorrhagic anemia: Secondary | ICD-10-CM | POA: Diagnosis not present

## 2022-07-29 DIAGNOSIS — I350 Nonrheumatic aortic (valve) stenosis: Secondary | ICD-10-CM | POA: Diagnosis not present

## 2022-07-29 DIAGNOSIS — K921 Melena: Secondary | ICD-10-CM | POA: Diagnosis not present

## 2022-07-29 DIAGNOSIS — K31811 Angiodysplasia of stomach and duodenum with bleeding: Secondary | ICD-10-CM | POA: Insufficient documentation

## 2022-07-29 LAB — BASIC METABOLIC PANEL
Anion gap: 7 (ref 5–15)
BUN: 21 mg/dL (ref 8–23)
CO2: 25 mmol/L (ref 22–32)
Calcium: 9.4 mg/dL (ref 8.9–10.3)
Chloride: 109 mmol/L (ref 98–111)
Creatinine, Ser: 0.99 mg/dL (ref 0.44–1.00)
GFR, Estimated: >60 mL/min (ref >60)
Glucose, Bld: 91 mg/dL (ref 70–99)
Potassium: 3.8 mmol/L (ref 3.5–5.1)
Sodium: 141 mmol/L (ref 135–145)

## 2022-07-29 LAB — CBC
HCT: 25.5 % — ABNORMAL LOW (ref 36.0–46.0)
Hemoglobin: 8.3 g/dL — ABNORMAL LOW (ref 12.0–15.0)
MCH: 31.7 pg (ref 26.0–34.0)
MCHC: 32.5 g/dL (ref 30.0–36.0)
MCV: 97.3 fL (ref 80.0–100.0)
Platelets: 151 10*3/uL (ref 150–400)
RBC: 2.62 MIL/uL — ABNORMAL LOW (ref 3.87–5.11)
RDW: 22.3 % — ABNORMAL HIGH (ref 11.5–15.5)
WBC: 5.6 10*3/uL (ref 4.0–10.5)
nRBC: 0 % (ref 0.0–0.2)

## 2022-07-29 LAB — VITAMIN D 25 HYDROXY (VIT D DEFICIENCY, FRACTURES): Vit D, 25-Hydroxy: 56.38 ng/mL (ref 30–100)

## 2022-07-29 LAB — MAGNESIUM: Magnesium: 2.1 mg/dL (ref 1.7–2.4)

## 2022-07-29 LAB — GLUCOSE, CAPILLARY: Glucose-Capillary: 94 mg/dL (ref 70–99)

## 2022-07-29 LAB — SURGICAL PATHOLOGY

## 2022-07-29 MED ORDER — PANTOPRAZOLE SODIUM 40 MG PO TBEC: 40.0000 mg | DELAYED_RELEASE_TABLET | Freq: Every day | ORAL | 0 refills | Status: DC

## 2022-07-29 NOTE — Discharge Summary (Signed)
Physician Discharge Summary   Patient: Renee [name] MRN: [id number] DOB: 21-Dec-1950  Admit date:     07/26/2022  Discharge date: 07/29/22  Discharge Physician: Sharen Hones   PCP: Adin Hector, MD   Recommendations at discharge:   Follow-up with PCP in 1 week. Follow-up with GI in 1 month. Follow-up with cardiology as scheduled.  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 *  Hospital Course: Renee [name] is a 72 y.o. female with medical history significant of CAD s/p CABG, severe aortic stenosis, hypertension, type 2 diabetes, CKD stage III, who presents to the ED due to dark stool.  Hemoglobin dropped down to 6.5, she has received 2 units of blood transfusion. Seen by GI, EGD did not show any evidence of GI bleeding.  But showed gastric vascular ectasia.  Colonoscopy did not show any evidence of bleeding.  Per Dr. Alice Reichert, GI bleed probably is due to vascular ectasia. Patient did not have a recurrent GI bleed.  Medically stable to be discharged.  Follow-up with GI in 1 months.  Assessment and Plan:  Melena Acute blood loss anemia Patient received 2 units blood transfusion.  Etiology of GI bleed is due to gastric vascular ectasia.  Will continue PPI.  Patient does has risk of recurrent GI bleed.  She will follow-up with GI as outpatient. Continue iron supplement, B12 level normal. Hb stable at 8.3 today.   Severe aortic stenosis. Seen by cardiology, currently patient does not have a congestive heart  failure. Will be followed by cardiology as outpatient.   Hypercalcemia Hypokalemia. Hypomagnesemia. Will discontinue hydrochlorothiazide.  PTH is low, vitamin D level was normal. Condition has improved, calcium level normalized.   CKD (chronic kidney disease), stage IIIa (HCC) Renal function stable.   CAD in native artery Hold aspirin until seen by PCP.   Type 2 diabetes mellitus with diabetic polyneuropathy, without long-term current use of insulin (HCC) Most recent A1c within goal at 6.2% Resume home regimen.   Essential hypertension - Continue home amlodipine, carvedilol Discontinue hydrochlorothiazide given hypercalcemia        Consultants: GI and cardiology. Procedures performed: EGD and colonoscopy. Disposition: Home Diet recommendation:  Discharge Diet Orders (From admission, onward)     Start     Ordered   07/29/22 0000  Diet - low sodium heart healthy        07/29/22 0947           Cardiac diet DISCHARGE MEDICATION: Allergies as of 07/29/2022       Reactions   Venlafaxine Other (See Comments)   intolerant   Codeine Nausea And Vomiting   Tramadol Nausea And Vomiting        Medication List     STOP taking these medications    ascorbic acid 500 MG tablet Commonly known as: VITAMIN C   aspirin EC 81 MG tablet   DULoxetine 30 MG capsule Commonly known as: CYMBALTA   hydrochlorothiazide 25 MG tablet Commonly known as: HYDRODIURIL   potassium chloride  10 MEQ tablet Commonly known as: KLOR-CON       TAKE these medications    Alpha-Lipoic Acid 600 MG Caps Take 600 mg by mouth daily.   amLODipine 5 MG tablet Commonly known as: NORVASC Take 5 mg by mouth daily.   Azelastine HCl 137 MCG/SPRAY Soln Place 1 spray into the nose 2 (two) times daily as needed (allergies).   carvedilol 6.25 MG tablet Commonly known as: COREG Take 6.25 mg by mouth 2 (two) times a day.   CINNAMON PO Take 1,000 mg by mouth daily.   [employer] Senna 8.6 MG  tablet Generic drug: senna TAKE 1 TABLET (8.6 MG TOTAL) BY MOUTH DAILY AS NEEDED FOR MILD CONSTIPATION.   EPINEPHrine 0.3 mg/0.3 mL Soaj injection Commonly known as: EPI-PEN Inject 0.3 mg into the muscle as needed for anaphylaxis.   Fish Oil 1000 MG Caps Take 1,000 mg by mouth 3 (three) times daily.   furosemide 20 MG tablet Commonly known as: LASIX Take 20 mg by mouth once a week. In the morning   gabapentin 800 MG tablet Commonly known as: NEURONTIN Take 800-1,600 mg by mouth See admin instructions. Take 1 tablet (800 mg) by mouth in the morning, 1 tablet (800 mg) by mouth at noon, & take 2 tablets (1600 mg) by mouth at bedtime.   loratadine 10 MG tablet Commonly known as: CLARITIN Take 10 mg by mouth daily as needed for allergies.   metFORMIN 500 MG 24 hr tablet Commonly known as: GLUCOPHAGE-XR Take 500 mg by mouth daily with breakfast.   nitroGLYCERIN 0.4 MG SL tablet Commonly known as: NITROSTAT Place 0.4 mg under the tongue every 5 (five) minutes as needed for chest pain.   pantoprazole 40 MG tablet Commonly known as: PROTONIX Take 1 tablet (40 mg total) by mouth daily.   potassium chloride SA 20 MEQ tablet Commonly known as: KLOR-CON M Take 20 mEq by mouth daily.   rosuvastatin 20 MG tablet Commonly known as: CRESTOR Take 20 mg by mouth at bedtime.   SLOW RELEASE IRON PO Take 1 tablet by mouth once a week.   Systane Balance 0.6 % Soln Generic drug: Propylene Glycol Place 1 drop into both eyes daily as needed (dry eyes).   tiZANidine 2 MG tablet Commonly known as: ZANAFLEX TAKE 1 TABLET BY MOUTH 3 TIMES DAILY.   Vitamin D3 50 MCG (2000 UT) Tabs Generic drug: Cholecalciferol Take 2,000 Units by mouth daily.        Follow-up Information     Yolonda Kida, MD. Go in 1 week(s).   Specialties: Cardiology, Internal Medicine Contact information: [place] [address] [phone]         Tama High III, MD Follow up  in 1 week(s).   Specialty: Internal Medicine Contact information: [place] [address] [phone]         [place], Teodoro K, MD Follow up in 1 month(s).   Specialty: Gastroenterology Contact information: [address]  [place] [address] [phone]                Discharge Exam: Filed Weights   07/26/22 1201 07/27/22 1520 07/28/22 1256  Weight: 73 kg 73 kg 28.5 kg   General exam: Appears calm and comfortable  Respiratory system: Clear to auscultation. Respiratory effort normal. Cardiovascular system: S1 & S2 heard, RRR. No JVD, murmurs, rubs, gallops or clicks. No pedal edema. Gastrointestinal system: Abdomen is nondistended, soft and  nontender. No organomegaly or masses felt. Normal bowel sounds heard. Central nervous system: Alert and oriented. No focal neurological deficits. Extremities: Symmetric 5 x 5 power. Skin: No rashes, lesions or ulcers Psychiatry: Judgement and insight appear normal. Mood & affect appropriate.    Condition at discharge: good  The results of significant diagnostics from this hospitalization (including imaging, microbiology, ancillary and laboratory) are listed below for reference.   Imaging Studies: No results found.  Microbiology: Results for orders placed or performed during the hospital encounter of 06/17/21  Surgical pcr screen     Status: None   Collection Time: 06/17/21 10:07 AM   Specimen: Nasal Mucosa; Nasal Swab  Result Value Ref Range Status   MRSA, PCR NEGATIVE NEGATIVE Final   Staphylococcus aureus NEGATIVE NEGATIVE Final    Comment: (NOTE) The Xpert SA Assay (FDA approved for NASAL specimens in patients 86 years of age and older), is one component of a comprehensive surveillance program. It is not intended to diagnose infection nor to guide or monitor treatment. Performed at [facility], [address] [place], [place] [address]      Labs: CBC: Recent Labs  Lab 07/26/22 1203 07/26/22 2244 07/27/22 [id number] 07/27/22 0928 07/27/22 1457 07/28/22 0356 07/29/22 0354  WBC 9.6 8.1 6.9  --   --  5.6 5.6  NEUTROABS 6.1  --   --   --   --   --   --   HGB 5.4* 6.5* 7.1* 7.8* 9.1* 8.1* 8.3*  HCT 17.4* 20.3* 21.9* 24.4* 27.5* 24.8* 25.5*  MCV 116.0* 103.6* 100.5*  --   --  96.9 97.3  PLT 207 169 165  --   --  150 [id number]   Basic Metabolic Panel: Recent Labs  Lab 07/26/22 1203 07/26/22 1355 07/26/22 1500 07/27/22 0312 07/28/22 0356 07/29/22 0354  NA 137  --  136 137 142 141  K 3.5  --  3.4* 3.1* 3.1* 3.8  CL 104  --  99 102 110 109  CO2 27  --  24 26 25 25  $ GLUCOSE 126*  --  112* 100* 91 91  BUN 30*  --  29* 26* 18 21  CREATININE 1.08*  --  1.04* 1.02* 0.87 0.99  CALCIUM 13.1*  --  12.9* 11.3* 10.0 9.4  MG  --  1.8  --   --  1.5* 2.1  PHOS  --  4.8*  --   --   --   --    Liver Function Tests: Recent Labs  Lab 07/26/22 1203 07/27/22 0312  AST 22 16  ALT 15 13  ALKPHOS 40 36*  BILITOT 0.5 0.6  PROT 6.5 5.4*  ALBUMIN 3.6 3.1*   CBG: Recent Labs  Lab 07/28/22 0752 07/28/22 1149 07/28/22 1719 07/28/22 2008 07/29/22 0840  GLUCAP 93 98 122* 103* 94    Discharge time spent: greater than 30 minutes.  Signed: Sharen Hones, MD [facility] 07/29/2022

## 2022-07-29 NOTE — Anesthesia Postprocedure Evaluation (Signed)
Anesthesia Post Note  Patient: Renee [name]  Procedure(s) Performed: COLONOSCOPY  Patient location during evaluation: Endoscopy Anesthesia Type: General Level of consciousness: awake and alert Pain management: pain level controlled Vital Signs Assessment: post-procedure vital signs reviewed and stable Respiratory status: spontaneous breathing, nonlabored ventilation, respiratory function stable and patient connected to nasal cannula oxygen Cardiovascular status: blood pressure returned to baseline and stable Postop Assessment: no apparent nausea or vomiting Anesthetic complications: no   No notable events documented.   Last Vitals:  Vitals:   07/29/22 0530 07/29/22 0842  BP: 115/73 127/67  Pulse: 67 66  Resp: 18 18  Temp: (!) 36.3 C (!) 36.4 C  SpO2: 100% 100%    Last Pain:  Vitals:   07/29/22 1136  TempSrc:   PainSc: 0-No pain                 Arita Miss

## 2022-07-30 DIAGNOSIS — E611 Iron deficiency: Secondary | ICD-10-CM | POA: Diagnosis not present

## 2022-07-30 DIAGNOSIS — E1142 Type 2 diabetes mellitus with diabetic polyneuropathy: Secondary | ICD-10-CM | POA: Diagnosis not present

## 2022-07-30 DIAGNOSIS — E7800 Pure hypercholesterolemia, unspecified: Secondary | ICD-10-CM | POA: Diagnosis not present

## 2022-07-30 DIAGNOSIS — G63 Polyneuropathy in diseases classified elsewhere: Secondary | ICD-10-CM | POA: Diagnosis not present

## 2022-07-30 DIAGNOSIS — M48062 Spinal stenosis, lumbar region with neurogenic claudication: Secondary | ICD-10-CM | POA: Diagnosis not present

## 2022-08-03 ENCOUNTER — Ambulatory Visit
Admission: RE | Admit: 2022-08-03 | Payer: No Typology Code available for payment source | Source: Home / Self Care | Admitting: Gastroenterology

## 2022-08-03 ENCOUNTER — Encounter: Admission: RE | Payer: Self-pay | Source: Home / Self Care

## 2022-08-03 SURGERY — COLONOSCOPY
Anesthesia: General

## 2022-08-04 DIAGNOSIS — Z952 Presence of prosthetic heart valve: Secondary | ICD-10-CM | POA: Diagnosis not present

## 2022-08-05 DIAGNOSIS — I10 Essential (primary) hypertension: Secondary | ICD-10-CM | POA: Diagnosis not present

## 2022-08-05 DIAGNOSIS — G63 Polyneuropathy in diseases classified elsewhere: Secondary | ICD-10-CM | POA: Diagnosis not present

## 2022-08-05 DIAGNOSIS — E7800 Pure hypercholesterolemia, unspecified: Secondary | ICD-10-CM | POA: Diagnosis not present

## 2022-08-05 DIAGNOSIS — I350 Nonrheumatic aortic (valve) stenosis: Secondary | ICD-10-CM | POA: Diagnosis not present

## 2022-08-05 DIAGNOSIS — M48062 Spinal stenosis, lumbar region with neurogenic claudication: Secondary | ICD-10-CM | POA: Diagnosis not present

## 2022-08-05 DIAGNOSIS — I2510 Atherosclerotic heart disease of native coronary artery without angina pectoris: Secondary | ICD-10-CM | POA: Diagnosis not present

## 2022-08-05 DIAGNOSIS — E1142 Type 2 diabetes mellitus with diabetic polyneuropathy: Secondary | ICD-10-CM | POA: Diagnosis not present

## 2022-08-05 DIAGNOSIS — Z2821 Immunization not carried out because of patient refusal: Secondary | ICD-10-CM | POA: Diagnosis not present

## 2022-08-05 DIAGNOSIS — Z0000 Encounter for general adult medical examination without abnormal findings: Secondary | ICD-10-CM | POA: Diagnosis not present

## 2022-08-05 DIAGNOSIS — Z8719 Personal history of other diseases of the digestive system: Secondary | ICD-10-CM | POA: Diagnosis not present

## 2022-08-10 ENCOUNTER — Other Ambulatory Visit: Payer: Self-pay | Admitting: Internal Medicine

## 2022-08-10 DIAGNOSIS — I350 Nonrheumatic aortic (valve) stenosis: Secondary | ICD-10-CM

## 2022-08-11 ENCOUNTER — Ambulatory Visit
Admission: RE | Admit: 2022-08-11 | Discharge: 2022-08-11 | Disposition: A | Discharge status: Home / Self Care | Payer: No Typology Code available for payment source | Source: Ambulatory Visit | Attending: Internal Medicine | Admitting: Internal Medicine

## 2022-08-11 DIAGNOSIS — I350 Nonrheumatic aortic (valve) stenosis: Secondary | ICD-10-CM | POA: Insufficient documentation

## 2022-08-11 DIAGNOSIS — I712 Thoracic aortic aneurysm, without rupture, unspecified: Secondary | ICD-10-CM | POA: Diagnosis not present

## 2022-08-11 MED ORDER — IOHEXOL 350 MG/ML SOLN
75.0000 mL | Freq: Once | INTRAVENOUS | Status: AC | PRN
  Administered 2022-08-11: 60 mL via INTRAVENOUS

## 2022-08-31 DIAGNOSIS — Z955 Presence of coronary angioplasty implant and graft: Secondary | ICD-10-CM | POA: Diagnosis not present

## 2022-08-31 DIAGNOSIS — I10 Essential (primary) hypertension: Secondary | ICD-10-CM | POA: Diagnosis not present

## 2022-08-31 DIAGNOSIS — R011 Cardiac murmur, unspecified: Secondary | ICD-10-CM | POA: Diagnosis not present

## 2022-08-31 DIAGNOSIS — R0609 Other forms of dyspnea: Secondary | ICD-10-CM | POA: Diagnosis not present

## 2022-08-31 DIAGNOSIS — E1142 Type 2 diabetes mellitus with diabetic polyneuropathy: Secondary | ICD-10-CM | POA: Diagnosis not present

## 2022-08-31 DIAGNOSIS — N183 Chronic kidney disease, stage 3 unspecified: Secondary | ICD-10-CM | POA: Diagnosis not present

## 2022-08-31 DIAGNOSIS — I2510 Atherosclerotic heart disease of native coronary artery without angina pectoris: Secondary | ICD-10-CM | POA: Diagnosis not present

## 2022-08-31 DIAGNOSIS — E782 Mixed hyperlipidemia: Secondary | ICD-10-CM | POA: Diagnosis not present

## 2022-08-31 DIAGNOSIS — M48062 Spinal stenosis, lumbar region with neurogenic claudication: Secondary | ICD-10-CM | POA: Diagnosis not present

## 2022-08-31 DIAGNOSIS — I350 Nonrheumatic aortic (valve) stenosis: Secondary | ICD-10-CM | POA: Diagnosis not present

## 2022-09-02 DIAGNOSIS — R7989 Other specified abnormal findings of blood chemistry: Secondary | ICD-10-CM | POA: Diagnosis not present

## 2022-09-02 DIAGNOSIS — Z8719 Personal history of other diseases of the digestive system: Secondary | ICD-10-CM | POA: Diagnosis not present

## 2022-09-08 ENCOUNTER — Ambulatory Visit
Payer: No Typology Code available for payment source | Attending: Cardiovascular Disease | Admitting: Cardiovascular Disease

## 2022-09-08 ENCOUNTER — Encounter: Payer: Self-pay | Admitting: Cardiovascular Disease

## 2022-09-08 VITALS — BP 144/80 | HR 73 | Ht 63.0 in | Wt 162.4 lb

## 2022-09-08 DIAGNOSIS — I350 Nonrheumatic aortic (valve) stenosis: Secondary | ICD-10-CM

## 2022-09-08 DIAGNOSIS — Z01810 Encounter for preprocedural cardiovascular examination: Secondary | ICD-10-CM

## 2022-09-08 NOTE — Progress Notes (Signed)
Cardiology Office Note:    Date:  09/14/2022   ID:  Renee [name], DOB [birth year], MRN [id number]  PCP:  Lynnea Ferrier, MD   [facility] Providers Cardiologist:  None     Referring MD: Lynnea Ferrier, MD   Chief Complaint  Patient presents with   Shortness of Breath    History of Present Illness:    Renee [name] is a 72 y.o. female referred by Dr. Juliann Pares for evaluation of severe aortic stenosis.  The patient is a delightful 72 year old woman who is here with her sister today.  She has a longstanding heart murmur and she has been followed by Dr. Juliann Pares for moderate aortic stenosis.  The patient has a remote history of CABG in 1996.  She recalls that this was done at [facility] but she does not remember which surgeon operated on her.  I cannot find records and we are going to try to find a paper records with her surgical anatomy.  She does not remember how many bypasses were done.  She does not think any veins were taken from her legs.  The patient was hospitalized in February 2024 with melena and lower GI bleeding.  She states that all of this has now resolved.  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  She was placed on a proton pump inhibitor empirically.  The patient's most recent echocardiogram demonstrated worsening of her aortic stenosis.  The echo from [place] showed severe aortic stenosis with mild aortic insufficiency.  The peak transaortic velocity is 4.3 m/s with a peak gradient of 73 mmHg and a mean gradient of 36 mmHg.  The patient's aortic valve area calculates to 0.73 cm.  Her LVEF is normal at greater than 55%.  From a symptomatic perspective, the patient is doing pretty well.  She denies chest pain, chest pressure, lightheadedness, heart palpitations, or syncope.  She has very mild dyspnea with exertion mild fatigue,  otherwise no specific complaints.  Past Medical History:  Diagnosis Date   Arthritis    Chronic kidney disease    stage 3   Coronary artery disease    Diabetes mellitus without complication    GERD (gastroesophageal reflux disease)    no meds   Heart murmur    Hypertension     Past Surgical History:  Procedure Laterality Date   ABDOMINAL HYSTERECTOMY     BREAST EXCISIONAL BIOPSY Right 20+ yrs ago   benign   CARDIAC SURGERY     cabg   CARPAL TUNNEL RELEASE Bilateral    COLONOSCOPY N/A 02/24/2017   Procedure: COLONOSCOPY;  Surgeon: Toledo, Boykin Nearing, MD;  Location: [facility] ENDOSCOPY;  Service: Endoscopy;  Laterality: N/A;   COLONOSCOPY N/A 07/28/2022   Procedure: COLONOSCOPY;  Surgeon: Toledo, Boykin Nearing, MD;  Location: [facility] ENDOSCOPY;  Service: Gastroenterology;  Laterality: N/A;   CORONARY ARTERY BYPASS GRAFT     1996   ESOPHAGOGASTRODUODENOSCOPY (EGD) WITH PROPOFOL N/A 07/27/2022   Procedure: ESOPHAGOGASTRODUODENOSCOPY (EGD) WITH PROPOFOL;  Surgeon: Toledo, Boykin Nearing, MD;  Location: [facility] ENDOSCOPY;  Service: Gastroenterology;  Laterality: N/A;   HERNIA REPAIR     umbilical   LUMBAR LAMINECTOMY/DECOMPRESSION MICRODISCECTOMY N/A 06/25/2021   Procedure: L2-3 DECOMPRESSION;  Surgeon: Venetia Night, MD;  Location: [facility] ORS;  Service: Neurosurgery;  Laterality: N/A;   TOTAL KNEE ARTHROPLASTY Left 10/25/2018  Procedure: TOTAL KNEE ARTHROPLASTY - LEFT - DIABETIC;  Surgeon: Kennedy Bucker, MD;  Location: [facility] ORS;  Service: Orthopedics;  Laterality: Left;    Current Medications: Current Meds  Medication Sig   ACCU-CHEK GUIDE test strip USE TO TEST SUGAR DAILY   Accu-Chek Softclix Lancets lancets daily.   Alpha-Lipoic Acid 600 MG CAPS Take 600 mg by mouth daily.   amLODipine (NORVASC) 5 MG tablet Take 5 mg by mouth daily.   Azelastine HCl 137 MCG/SPRAY SOLN Place 1 spray into the nose 2 (two) times daily as needed (allergies).   Blood Glucose Monitoring Suppl (ACCU-CHEK GUIDE ME)  w/Device KIT USE AS DIRECTED TO TEST SUGAR DAILY   carvedilol (COREG) 6.25 MG tablet Take 6.25 mg by mouth 2 (two) times a day.    Cholecalciferol (VITAMIN D3) 50 MCG (2000 UT) TABS Take 2,000 Units by mouth daily.   CINNAMON PO Take 1,000 mg by mouth daily.   [employer] SENNA 8.6 MG tablet TAKE 1 TABLET (8.6 MG TOTAL) BY MOUTH DAILY AS NEEDED FOR MILD CONSTIPATION.   EPINEPHrine 0.3 mg/0.3 mL IJ SOAJ injection Inject 0.3 mg into the muscle as needed for anaphylaxis.   furosemide (LASIX) 20 MG tablet Take 20 mg by mouth once a week. In the morning   gabapentin (NEURONTIN) 800 MG tablet Take 800-1,600 mg by mouth See admin instructions. Take 1 tablet (800 mg) by mouth in the morning, 1 tablet (800 mg) by mouth at noon, & take 2 tablets (1600 mg) by mouth at bedtime.   metFORMIN (GLUCOPHAGE-XR) 500 MG 24 hr tablet Take 500 mg by mouth daily with breakfast.   nitroGLYCERIN (NITROSTAT) 0.4 MG SL tablet Place 0.4 mg under the tongue every 5 (five) minutes as needed for chest pain.   Omega-3 Fatty Acids (FISH OIL) 1000 MG CAPS Take 1,000 mg by mouth 3 (three) times daily.   potassium chloride SA (KLOR-CON M) 20 MEQ tablet Take 20 mEq by mouth daily.   Propylene Glycol (SYSTANE BALANCE) 0.6 % SOLN Place 1 drop into both eyes daily as needed (dry eyes).   rosuvastatin (CRESTOR) 20 MG tablet Take 20 mg by mouth at bedtime.   tiZANidine (ZANAFLEX) 2 MG tablet TAKE 1 TABLET BY MOUTH 3 TIMES DAILY. (Patient taking differently: Take 2 mg by mouth every 8 (eight) hours as needed for muscle spasms.)   [DISCONTINUED] Ferrous Sulfate (SLOW RELEASE IRON PO) Take 1 tablet by mouth once a week.   [DISCONTINUED] hydrochlorothiazide (HYDRODIURIL) 25 MG tablet Take by mouth daily.   [DISCONTINUED] loratadine (CLARITIN) 10 MG tablet Take 10 mg by mouth daily as needed for allergies.   [DISCONTINUED] pantoprazole (PROTONIX) 40 MG tablet Take 1 tablet (40 mg total) by mouth daily.     Allergies:   Venlafaxine, Codeine, and  Tramadol   Social History   Socioeconomic History   Marital status: Widowed    Spouse name: Not on file   Number of children: Not on file   Years of education: Not on file   Highest education level: Not on file  Occupational History   Not on file  Tobacco Use   Smoking status: Every Day    Packs/day: 0.25    Years: 35.00    Additional pack years: 0.00    Total pack years: 8.75    Types: Cigarettes   Smokeless tobacco: Never  Vaping Use   Vaping Use: Never used  Substance and Sexual Activity   Alcohol use: Yes   Drug use: No  Sexual activity: Not on file  Other Topics Concern   Not on file  Social History Narrative   Son lives with her. Spouse died 03-Jun-2019   Social Determinants of Health   Financial Resource Strain: Not on file  Food Insecurity: No Food Insecurity (07/26/2022)   Hunger Vital Sign    Worried About Running Out of Food in the Last Year: Never true    Ran Out of Food in the Last Year: Never true  Transportation Needs: No Transportation Needs (07/26/2022)   PRAPARE - Administrator, Civil Service (Medical): No    Lack of Transportation (Non-Medical): No  Physical Activity: Not on file  Stress: Not on file  Social Connections: Not on file     Family History: The patient's family history is negative for Breast cancer.  ROS:   Please see the history of present illness.    All other systems reviewed and are negative.  EKGs/Labs/Other Studies Reviewed:    The following studies were reviewed today: Echo 03-10-2022: 2D DIMENSIONS  AORTA                  Values   Normal Range   MAIN PA         Values    Normal Range                Annulus: 1.8 cm       [2.1-2.5]         PA Main: nm*       [1.5-2.1]              Aorta Sin: 2.5 cm       [2.7-3.3]    RIGHT VENTRICLE            ST Junction: nm*          [2.3-2.9]         RV Base: nm*       [<4.2]             Asc.Aorta: 3.0 cm       [2.3-3.1]          RV Mid: 2.5 cm    [<3.5]  LEFT VENTRICLE                                       RV Length: nm*       [<8.6]                 LVIDd: 5.1 cm       [3.9-5.3]    INFERIOR VENA CAVA                  LVIDs: 3.5 cm                        Max. IVC: nm*       [<=2.1]                    FS: 31.5 %       [>25]            Min. IVC: nm*                    SWT: 1.2 cm       [0.5-0.9]    ------------------  PWT: 1.1 cm       [0.5-0.9]    nm* - not measured  LEFT ATRIUM                LA Diam: 3.8 cm       [2.7-3.8]            LA A4C Area: nm*          [<20]             LA Volume: nm*          [22-52]  _________________________________________________________________________________________  ECHOCARDIOGRAPHIC DESCRIPTIONS  AORTIC ROOT                   Size: Normal             Dissection: INDETERM FOR DISSECTION  AORTIC VALVE               Leaflets: Tricuspid                   Morphology: MODERATELY THICKENED               Mobility: Fully mobile  LEFT VENTRICLE                   Size: Normal                        Anterior: Normal            Contraction: Normal                         Lateral: Normal             Closest EF: >55% (Estimated)                Septal: Normal              LV Masses: No Masses                       Apical: Normal                    LVH: MILD LVH                      Inferior: Normal                                                      Posterior: Normal           Dias.FxClass: (Grade 1) relaxation abnormal, E/A reversal  MITRAL VALVE               Leaflets: Normal                        Mobility: Fully mobile             Morphology: Normal  LEFT ATRIUM                   Size: Normal                       LA Masses: No masses              IA Septum: Normal IAS  MAIN PA                   Size: Normal  PULMONIC VALVE             Morphology: Normal                        Mobility: Fully mobile  RIGHT VENTRICLE              RV Masses: No Masses                         Size: Normal              Free Wall:  Normal                     Contraction: Normal  TRICUSPID VALVE               Leaflets: Normal                        Mobility: Fully mobile             Morphology: Normal  RIGHT ATRIUM                   Size: Normal                        RA Other: None                RA Mass: No masses  PERICARDIUM                 Fluid: No effusion  INFERIOR VENACAVA                   Size: Normal Normal respiratory collapse  _________________________________________________________________________________________   DOPPLER ECHO and OTHER SPECIAL PROCEDURES                 Aortic: MILD AR                    SEVERE AS                         428.0 cm/sec peak vel      73.3 mmHg peak grad                         35.5 mmHg mean grad                 Mitral: MILD MR                    No MS                         MV Inflow E Vel = 89.6 cm/sec     MV Annulus E'Vel = 5.9 cm/sec                         E/E'Ratio = 15.2              Tricuspid: MILD TR                    No TS  253.0 cm/sec peak TR vel   28.6 mmHg peak RV pressure              Pulmonary: TRIVIAL PR                 No PS                         83.4 cm/sec peak vel       2.8 mmHg peak grad  _________________________________________________________________________________________  INTERPRETATION  NORMAL LEFT VENTRICULAR SYSTOLIC FUNCTION   WITH MILD LVH  NORMAL RIGHT VENTRICULAR SYSTOLIC FUNCTION  MILD VALVULAR REGURGITATION (See above)  MODERATE VALVULAR STENOSIS (See above)  AVA(VTI)= .73cm^2  MILD MR, TR, AR  MODERATE to SEVERE AS  TRIVIAL PR  EF >55%   EKG:  EKG is ordered today.  The ekg ordered today demonstrates normal sinus rhythm 73 bpm, nonspecific ST abnormality  Recent Labs: 07/27/2022: ALT 13 07/29/2022: Magnesium 2.1 09/08/2022: BUN 21; Creatinine, Ser 1.01; Hemoglobin 10.6; Platelets 232; Potassium 4.4; Sodium 145  Recent Lipid Panel    Component Value Date/Time   CHOL 109 10/22/2011 0340   TRIG  225 (H) 10/22/2011 0340   HDL 16 (L) 10/22/2011 0340   VLDL 45 (H) 10/22/2011 0340   LDLCALC 48 10/22/2011 0340     Risk Assessment/Calculations:          Physical Exam:    VS:  BP (!) 144/80   Pulse 73   Ht  (1.6 m)   Wt 162 lb 6.4 oz (73.7 kg)   SpO2 99%   BMI 28.77 kg/m     Wt Readings from Last 3 Encounters:  09/08/22 162 lb 6.4 oz (73.7 kg)  08/11/22 168 lb (76.2 kg)  07/28/22 62 lb 14 oz (28.5 kg)     GEN:  Well nourished, well developed in no acute distress HEENT: Normal NECK: No JVD; No carotid bruits LYMPHATICS: No lymphadenopathy CARDIAC: RRR, 3/6 harsh late peaking systolic murmur at the right upper sternal border with absent A2 RESPIRATORY:  Clear to auscultation without rales, wheezing or rhonchi  ABDOMEN: Soft, non-tender, non-distended MUSCULOSKELETAL:  No edema; No deformity  SKIN: Warm and dry NEUROLOGIC:  Alert and oriented x 3 PSYCHIATRIC:  Normal affect   ASSESSMENT:    1. Nonrheumatic aortic valve stenosis   2. Pre-procedural cardiovascular examination    PLAN:    In order of problems listed above:  The patient has severe, stage D1 aortic stenosis with NYHA functional class I-II symptoms of fatigue and exertional dyspnea.  She had a recent GI bleed likely related to AVMs but her symptoms seem to have resolved and her hemoglobin has stabilized.  Hemoglobin today is 10.6, up from 8.3 in February.  I reviewed the natural history of aortic stenosis with the patient and her sister today.  We discussed the progressive nature of the disease, indications for intervention, and contrasted the pros and cons of TAVR versus surgical aortic valve replacement.  With her previous heart surgery and comorbid conditions of recent GI bleed, stage IIIa chronic kidney disease, and type 2 diabetes, TAVR would be a reasonable treatment option for her.  She will need to undergo coronary angiography to assess for native coronary and graft patency for the procedure.   She will need to undergo CTA studies of the chest, abdomen, and pelvis, as well as a gated CTA of the heart to evaluate for TAVR feasibility and access. I have reviewed the risks,  indications, and alternatives to cardiac catheterization, possible angioplasty, and stenting with the patient. Risks include but are not limited to bleeding, infection, vascular injury, stroke, myocardial infection, arrhythmia, kidney injury, radiation-related injury in the case of prolonged fluoroscopy use, emergency cardiac surgery, and death. The patient understands the risks of serious complication is 1-2 in 1000 with diagnostic cardiac cath and 1-2% or less with angioplasty/stenting.  Once her preoperative studies are completed, she will be referred for formal cardiac surgical consultation as part of a multidisciplinary approach to her care.  The patient reports regular dental care with no problems involving her teeth.      Shared Decision Making/Informed Consent The risks [stroke (1 in 1000), death (1 in 1000), kidney failure [usually temporary] (1 in 500), bleeding (1 in 200), allergic reaction [possibly serious] (1 in 200)], benefits (diagnostic support and management of coronary artery disease) and alternatives of a cardiac catheterization were discussed in detail with Ms. Lanae BoastGarner and she is willing to proceed.    Medication Adjustments/Labs and Tests Ordered: Current medicines are reviewed at length with the patient today.  Concerns regarding medicines are outlined above.  Orders Placed This Encounter  Procedures   CT ANGIO CHEST AORTA W/CM & OR WO/CM   CT ANGIO ABDOMEN PELVIS  W &/OR WO CONTRAST   CT CORONARY MORPH W/CTA COR W/SCORE W/CA W/CM &/OR WO/CM   CBC   Basic metabolic panel   EKG 12-Lead   No orders of the defined types were placed in this encounter.   Patient Instructions  Medication Instructions:  Your physician recommends that you continue on your current medications as directed. Please refer  to the Current Medication list given to you today.  *If you need a refill on your cardiac medications before your next appointment, please call your pharmacy*   Lab Work: CBC, BMET today If you have labs (blood work) drawn today and your tests are completely normal, you will receive your results only by: MyChart Message (if you have MyChart) OR A paper copy in the mail If you have any lab test that is abnormal or we need to change your treatment, we will call you to review the results.   Testing/Procedures: R & L heart catheterization Your physician has requested that you have a cardiac catheterization. Cardiac catheterization is used to diagnose and/or treat various heart conditions. Doctors may recommend this procedure for a number of different reasons. The most common reason is to evaluate chest pain. Chest pain can be a symptom of coronary artery disease (CAD), and cardiac catheterization can show whether plaque is narrowing or blocking your heart's arteries. This procedure is also used to evaluate the valves, as well as measure the blood flow and oxygen levels in different parts of your heart. For further information please visit [email or web address]. Please follow instruction sheet, as given.  Follow-Up: At [facility], you and your health needs are our priority.  As part of our continuing mission to provide you with exceptional heart care, we have created designated Provider Care Teams.  These Care Teams include your primary Cardiologist (physician) and Advanced Practice Providers (APPs -  Physician Assistants and Nurse Practitioners) who all work together to provide you with the care you need, when you need it.  We recommend signing up for the patient portal called "MyChart".  Sign up information is provided on this After Visit Summary.  MyChart is used to connect with patients for Virtual Visits (Telemedicine).  Patients are able to view lab/test results, encounter  notes,  upcoming appointments, etc.  Non-urgent messages can be sent to your provider as well.   To learn more about what you can do with MyChart, go to [email or web address].    Your next appointment:   Structural team will follow-up  Provider:   Tonny Bollman, MD    Other Instructions       Cardiac/Peripheral Catheterization   You are scheduled for a Cardiac Catheterization on Tuesday, April 9 with Dr. Tonny Bollman.  1. Please arrive at the Main Entrance A at [facility]: [address] [place], [place] [address] on April 9 at 7:30 AM (This time is four hours before your procedure to ensure your preparation). Free valet parking service is available. You will check in at ADMITTING. The support person will be asked to wait in the waiting room.  It is OK to have someone drop you off and come back when you are ready to be discharged.        Special note: Every effort is made to have your procedure done on time. Please understand that emergencies sometimes delay scheduled procedures.   . 2. Diet: Do not eat solid foods after midnight.  You may have clear liquids until 5 AM the day of the procedure.  3. Labs: Today  4. Medication instructions in preparation for your procedure:   Contrast Allergy: No  DO NOT TAKE Furosemide or Potassium the day before or same day of procedure  DO NOT TAKE Metformin the day of procedure AND must hold for 2 days afterwards (resume Friday)  On the morning of your procedure, take Aspirin 81 mg and any morning medicines NOT listed above.  You may use sips of water.  5. Plan to go home the same day, you will only stay overnight if medically necessary. 6. You MUST have a responsible adult to drive you home. 7. An adult MUST be with you the first 24 hours after you arrive home. 8. Bring a current list of your medications, and the last time and date medication taken. 9. Bring ID and current insurance cards. 10.Please wear clothes that are easy to  get on and off and wear slip-on shoes.  Thank you for allowing [place] to care for you!   -- [facility]    Signed, Tonny Bollman, MD  09/14/2022 5:57 PM     [facility]

## 2022-09-08 NOTE — H&P (View-Only) (Signed)
Cardiology Office Note:    Date:  09/14/2022   ID:  Renee [name], DOB [birth year], MRN [id number]  PCP:  Renee Ferrier, MD   [facility] Providers Cardiologist:  None     Referring MD: Renee Ferrier, MD   Chief Complaint  Patient presents with   Shortness of Breath    History of Present Illness:    Renee [name] is a 72 y.o. female referred by Dr. Juliann [name] for evaluation of severe aortic stenosis.  The patient is a delightful 72 year old woman who is here with her sister today.  She has a longstanding heart murmur and she has been followed by Dr. Juliann [name] for moderate aortic stenosis.  The patient has a remote history of CABG in 1996.  She recalls that this was done at [facility] but she does not remember which surgeon operated on her.  I cannot find records and we are going to try to find a paper records with her surgical anatomy.  She does not remember how many bypasses were done.  She does not think any veins were taken from her legs.  The patient was hospitalized in February 2024 with melena and lower GI bleeding.  She states that all of this has now resolved.  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  She was placed on a proton pump inhibitor empirically.  The patient's most recent echocardiogram demonstrated worsening of her aortic stenosis.  The echo from [place] showed severe aortic stenosis with mild aortic insufficiency.  The peak transaortic velocity is 4.3 m/s with a peak gradient of 73 mmHg and a mean gradient of 36 mmHg.  The patient's aortic valve area calculates to 0.73 cm.  Her LVEF is normal at greater than 55%.  From a symptomatic perspective, the patient is doing pretty well.  She denies chest pain, chest pressure, lightheadedness, heart palpitations, or syncope.  She has very mild dyspnea with exertion mild fatigue,  otherwise no specific complaints.  Past Medical History:  Diagnosis Date   Arthritis    Chronic kidney disease    stage 3   Coronary artery disease    Diabetes mellitus without complication    GERD (gastroesophageal reflux disease)    no meds   Heart murmur    Hypertension     Past Surgical History:  Procedure Laterality Date   ABDOMINAL HYSTERECTOMY     BREAST EXCISIONAL BIOPSY Right 20+ yrs ago   benign   CARDIAC SURGERY     cabg   CARPAL TUNNEL RELEASE Bilateral    COLONOSCOPY N/A 02/24/2017   Procedure: COLONOSCOPY;  Surgeon: Toledo, Boykin Nearing, MD;  Location: [facility] ENDOSCOPY;  Service: Endoscopy;  Laterality: N/A;   COLONOSCOPY N/A 07/28/2022   Procedure: COLONOSCOPY;  Surgeon: Toledo, Boykin Nearing, MD;  Location: [facility] ENDOSCOPY;  Service: Gastroenterology;  Laterality: N/A;   CORONARY ARTERY BYPASS GRAFT     1996   ESOPHAGOGASTRODUODENOSCOPY (EGD) WITH PROPOFOL N/A 07/27/2022   Procedure: ESOPHAGOGASTRODUODENOSCOPY (EGD) WITH PROPOFOL;  Surgeon: Toledo, Boykin Nearing, MD;  Location: [facility] ENDOSCOPY;  Service: Gastroenterology;  Laterality: N/A;   HERNIA REPAIR     umbilical   LUMBAR LAMINECTOMY/DECOMPRESSION MICRODISCECTOMY N/A 06/25/2021   Procedure: L2-3 DECOMPRESSION;  Surgeon: Venetia Night, MD;  Location: [facility] ORS;  Service: Neurosurgery;  Laterality: N/A;   TOTAL KNEE ARTHROPLASTY Left 10/25/2018  Procedure: TOTAL KNEE ARTHROPLASTY - LEFT - DIABETIC;  Surgeon: Kennedy Bucker, MD;  Location: [facility] ORS;  Service: Orthopedics;  Laterality: Left;    Current Medications: Current Meds  Medication Sig   ACCU-CHEK GUIDE test strip USE TO TEST SUGAR DAILY   Accu-Chek Softclix Lancets lancets daily.   Alpha-Lipoic Acid 600 MG CAPS Take 600 mg by mouth daily.   amLODipine (NORVASC) 5 MG tablet Take 5 mg by mouth daily.   Azelastine HCl 137 MCG/SPRAY SOLN Place 1 spray into the nose 2 (two) times daily as needed (allergies).   Blood Glucose Monitoring Suppl (ACCU-CHEK GUIDE ME)  w/Device KIT USE AS DIRECTED TO TEST SUGAR DAILY   carvedilol (COREG) 6.25 MG tablet Take 6.25 mg by mouth 2 (two) times a day.    Cholecalciferol (VITAMIN D3) 50 MCG (2000 UT) TABS Take 2,000 Units by mouth daily.   CINNAMON PO Take 1,000 mg by mouth daily.   [employer] SENNA 8.6 MG tablet TAKE 1 TABLET (8.6 MG TOTAL) BY MOUTH DAILY AS NEEDED FOR MILD CONSTIPATION.   EPINEPHrine 0.3 mg/0.3 mL IJ SOAJ injection Inject 0.3 mg into the muscle as needed for anaphylaxis.   furosemide (LASIX) 20 MG tablet Take 20 mg by mouth once a week. In the morning   gabapentin (NEURONTIN) 800 MG tablet Take 800-1,600 mg by mouth See admin instructions. Take 1 tablet (800 mg) by mouth in the morning, 1 tablet (800 mg) by mouth at noon, & take 2 tablets (1600 mg) by mouth at bedtime.   metFORMIN (GLUCOPHAGE-XR) 500 MG 24 hr tablet Take 500 mg by mouth daily with breakfast.   nitroGLYCERIN (NITROSTAT) 0.4 MG SL tablet Place 0.4 mg under the tongue every 5 (five) minutes as needed for chest pain.   Omega-3 Fatty Acids (FISH OIL) 1000 MG CAPS Take 1,000 mg by mouth 3 (three) times daily.   potassium chloride SA (KLOR-CON M) 20 MEQ tablet Take 20 mEq by mouth daily.   Propylene Glycol (SYSTANE BALANCE) 0.6 % SOLN Place 1 drop into both eyes daily as needed (dry eyes).   rosuvastatin (CRESTOR) 20 MG tablet Take 20 mg by mouth at bedtime.   tiZANidine (ZANAFLEX) 2 MG tablet TAKE 1 TABLET BY MOUTH 3 TIMES DAILY. (Patient taking differently: Take 2 mg by mouth every 8 (eight) hours as needed for muscle spasms.)   [DISCONTINUED] Ferrous Sulfate (SLOW RELEASE IRON PO) Take 1 tablet by mouth once a week.   [DISCONTINUED] hydrochlorothiazide (HYDRODIURIL) 25 MG tablet Take by mouth daily.   [DISCONTINUED] loratadine (CLARITIN) 10 MG tablet Take 10 mg by mouth daily as needed for allergies.   [DISCONTINUED] pantoprazole (PROTONIX) 40 MG tablet Take 1 tablet (40 mg total) by mouth daily.     Allergies:   Venlafaxine, Codeine, and  Tramadol   Social History   Socioeconomic History   Marital status: Widowed    Spouse name: Not on file   Number of children: Not on file   Years of education: Not on file   Highest education level: Not on file  Occupational History   Not on file  Tobacco Use   Smoking status: Every Day    Packs/day: 0.25    Years: 35.00    Additional pack years: 0.00    Total pack years: 8.75    Types: Cigarettes   Smokeless tobacco: Never  Vaping Use   Vaping Use: Never used  Substance and Sexual Activity   Alcohol use: Yes   Drug use: No  Sexual activity: Not on file  Other Topics Concern   Not on file  Social History Narrative   Son lives with her. Spouse died 06/02/19   Social Determinants of Health   Financial Resource Strain: Not on file  Food Insecurity: No Food Insecurity (07/26/2022)   Hunger Vital Sign    Worried About Running Out of Food in the Last Year: Never true    Ran Out of Food in the Last Year: Never true  Transportation Needs: No Transportation Needs (07/26/2022)   PRAPARE - Administrator, Civil Service (Medical): No    Lack of Transportation (Non-Medical): No  Physical Activity: Not on file  Stress: Not on file  Social Connections: Not on file     Family History: The patient's family history is negative for Breast cancer.  ROS:   Please see the history of present illness.    All other systems reviewed and are negative.  EKGs/Labs/Other Studies Reviewed:    The following studies were reviewed today: Echo 03-10-2022: 2D DIMENSIONS  AORTA                  Values   Normal Range   MAIN PA         Values    Normal Range                Annulus: 1.8 cm       [2.1-2.5]         PA Main: nm*       [1.5-2.1]              Aorta Sin: 2.5 cm       [2.7-3.3]    RIGHT VENTRICLE            ST Junction: nm*          [2.3-2.9]         RV Base: nm*       [<4.2]             Asc.Aorta: 3.0 cm       [2.3-3.1]          RV Mid: 2.5 cm    [<3.5]  LEFT VENTRICLE                                       RV Length: nm*       [<8.6]                 LVIDd: 5.1 cm       [3.9-5.3]    INFERIOR VENA CAVA                  LVIDs: 3.5 cm                        Max. IVC: nm*       [<=2.1]                    FS: 31.5 %       [>25]            Min. IVC: nm*                    SWT: 1.2 cm       [0.5-0.9]    ------------------  PWT: 1.1 cm       [0.5-0.9]    nm* - not measured  LEFT ATRIUM                LA Diam: 3.8 cm       [2.7-3.8]            LA A4C Area: nm*          [<20]             LA Volume: nm*          [22-52]  _________________________________________________________________________________________  ECHOCARDIOGRAPHIC DESCRIPTIONS  AORTIC ROOT                   Size: Normal             Dissection: INDETERM FOR DISSECTION  AORTIC VALVE               Leaflets: Tricuspid                   Morphology: MODERATELY THICKENED               Mobility: Fully mobile  LEFT VENTRICLE                   Size: Normal                        Anterior: Normal            Contraction: Normal                         Lateral: Normal             Closest EF: >55% (Estimated)                Septal: Normal              LV Masses: No Masses                       Apical: Normal                    LVH: MILD LVH                      Inferior: Normal                                                      Posterior: Normal           Dias.FxClass: (Grade 1) relaxation abnormal, E/A reversal  MITRAL VALVE               Leaflets: Normal                        Mobility: Fully mobile             Morphology: Normal  LEFT ATRIUM                   Size: Normal                       LA Masses: No masses              IA Septum: Normal IAS  MAIN PA                   Size: Normal  PULMONIC VALVE             Morphology: Normal                        Mobility: Fully mobile  RIGHT VENTRICLE              RV Masses: No Masses                         Size: Normal              Free Wall:  Normal                     Contraction: Normal  TRICUSPID VALVE               Leaflets: Normal                        Mobility: Fully mobile             Morphology: Normal  RIGHT ATRIUM                   Size: Normal                        RA Other: None                RA Mass: No masses  PERICARDIUM                 Fluid: No effusion  INFERIOR VENACAVA                   Size: Normal Normal respiratory collapse  _________________________________________________________________________________________   DOPPLER ECHO and OTHER SPECIAL PROCEDURES                 Aortic: MILD AR                    SEVERE AS                         428.0 cm/sec peak vel      73.3 mmHg peak grad                         35.5 mmHg mean grad                 Mitral: MILD MR                    No MS                         MV Inflow E Vel = 89.6 cm/sec     MV Annulus E'Vel = 5.9 cm/sec                         E/E'Ratio = 15.2              Tricuspid: MILD TR                    No TS  253.0 cm/sec peak TR vel   28.6 mmHg peak RV pressure              Pulmonary: TRIVIAL PR                 No PS                         83.4 cm/sec peak vel       2.8 mmHg peak grad  _________________________________________________________________________________________  INTERPRETATION  NORMAL LEFT VENTRICULAR SYSTOLIC FUNCTION   WITH MILD LVH  NORMAL RIGHT VENTRICULAR SYSTOLIC FUNCTION  MILD VALVULAR REGURGITATION (See above)  MODERATE VALVULAR STENOSIS (See above)  AVA(VTI)= .73cm^2  MILD MR, TR, AR  MODERATE to SEVERE AS  TRIVIAL PR  EF >55%   EKG:  EKG is ordered today.  The ekg ordered today demonstrates normal sinus rhythm 73 bpm, nonspecific ST abnormality  Recent Labs: 07/27/2022: ALT 13 07/29/2022: Magnesium 2.1 09/08/2022: BUN 21; Creatinine, Ser 1.01; Hemoglobin 10.6; Platelets 232; Potassium 4.4; Sodium 145  Recent Lipid Panel    Component Value Date/Time   CHOL 109 10/22/2011 0340   TRIG  225 (H) 10/22/2011 0340   HDL 16 (L) 10/22/2011 0340   VLDL 45 (H) 10/22/2011 0340   LDLCALC 48 10/22/2011 0340     Risk Assessment/Calculations:          Physical Exam:    VS:  BP (!) 144/80   Pulse 73   Ht  (1.6 m)   Wt 162 lb 6.4 oz (73.7 kg)   SpO2 99%   BMI 28.77 kg/m     Wt Readings from Last 3 Encounters:  09/08/22 162 lb 6.4 oz (73.7 kg)  08/11/22 168 lb (76.2 kg)  07/28/22 62 lb 14 oz (28.5 kg)     GEN:  Well nourished, well developed in no acute distress HEENT: Normal NECK: No JVD; No carotid bruits LYMPHATICS: No lymphadenopathy CARDIAC: RRR, 3/6 harsh late peaking systolic murmur at the right upper sternal border with absent A2 RESPIRATORY:  Clear to auscultation without rales, wheezing or rhonchi  ABDOMEN: Soft, non-tender, non-distended MUSCULOSKELETAL:  No edema; No deformity  SKIN: Warm and dry NEUROLOGIC:  Alert and oriented x 3 PSYCHIATRIC:  Normal affect   ASSESSMENT:    1. Nonrheumatic aortic valve stenosis   2. Pre-procedural cardiovascular examination    PLAN:    In order of problems listed above:  The patient has severe, stage D1 aortic stenosis with NYHA functional class I-II symptoms of fatigue and exertional dyspnea.  She had a recent GI bleed likely related to AVMs but her symptoms seem to have resolved and her hemoglobin has stabilized.  Hemoglobin today is 10.6, up from 8.3 in February.  I reviewed the natural history of aortic stenosis with the patient and her sister today.  We discussed the progressive nature of the disease, indications for intervention, and contrasted the pros and cons of TAVR versus surgical aortic valve replacement.  With her previous heart surgery and comorbid conditions of recent GI bleed, stage IIIa chronic kidney disease, and type 2 diabetes, TAVR would be a reasonable treatment option for her.  She will need to undergo coronary angiography to assess for native coronary and graft patency for the procedure.   She will need to undergo CTA studies of the chest, abdomen, and pelvis, as well as a gated CTA of the heart to evaluate for TAVR feasibility and access. I have reviewed the risks,  indications, and alternatives to cardiac catheterization, possible angioplasty, and stenting with the patient. Risks include but are not limited to bleeding, infection, vascular injury, stroke, myocardial infection, arrhythmia, kidney injury, radiation-related injury in the case of prolonged fluoroscopy use, emergency cardiac surgery, and death. The patient understands the risks of serious complication is 1-2 in 1000 with diagnostic cardiac cath and 1-2% or less with angioplasty/stenting.  Once her preoperative studies are completed, she will be referred for formal cardiac surgical consultation as part of a multidisciplinary approach to her care.  The patient reports regular dental care with no problems involving her teeth.      Shared Decision Making/Informed Consent The risks [stroke (1 in 1000), death (1 in 1000), kidney failure [usually temporary] (1 in 500), bleeding (1 in 200), allergic reaction [possibly serious] (1 in 200)], benefits (diagnostic support and management of coronary artery disease) and alternatives of a cardiac catheterization were discussed in detail with Ms. Lanae BoastGarner and she is willing to proceed.    Medication Adjustments/Labs and Tests Ordered: Current medicines are reviewed at length with the patient today.  Concerns regarding medicines are outlined above.  Orders Placed This Encounter  Procedures   CT ANGIO CHEST AORTA W/CM & OR WO/CM   CT ANGIO ABDOMEN PELVIS  W &/OR WO CONTRAST   CT CORONARY MORPH W/CTA COR W/SCORE W/CA W/CM &/OR WO/CM   CBC   Basic metabolic panel   EKG 12-Lead   No orders of the defined types were placed in this encounter.   Patient Instructions  Medication Instructions:  Your physician recommends that you continue on your current medications as directed. Please refer  to the Current Medication list given to you today.  *If you need a refill on your cardiac medications before your next appointment, please call your pharmacy*   Lab Work: CBC, BMET today If you have labs (blood work) drawn today and your tests are completely normal, you will receive your results only by: MyChart Message (if you have MyChart) OR A paper copy in the mail If you have any lab test that is abnormal or we need to change your treatment, we will call you to review the results.   Testing/Procedures: R & L heart catheterization Your physician has requested that you have a cardiac catheterization. Cardiac catheterization is used to diagnose and/or treat various heart conditions. Doctors may recommend this procedure for a number of different reasons. The most common reason is to evaluate chest pain. Chest pain can be a symptom of coronary artery disease (CAD), and cardiac catheterization can show whether plaque is narrowing or blocking your heart's arteries. This procedure is also used to evaluate the valves, as well as measure the blood flow and oxygen levels in different parts of your heart. For further information please visit [email or web address]. Please follow instruction sheet, as given.  Follow-Up: At [facility], you and your health needs are our priority.  As part of our continuing mission to provide you with exceptional heart care, we have created designated Provider Care Teams.  These Care Teams include your primary Cardiologist (physician) and Advanced Practice Providers (APPs -  Physician Assistants and Nurse Practitioners) who all work together to provide you with the care you need, when you need it.  We recommend signing up for the patient portal called "MyChart".  Sign up information is provided on this After Visit Summary.  MyChart is used to connect with patients for Virtual Visits (Telemedicine).  Patients are able to view lab/test results, encounter  notes,  upcoming appointments, etc.  Non-urgent messages can be sent to your provider as well.   To learn more about what you can do with MyChart, go to [email or web address].    Your next appointment:   Structural team will follow-up  Provider:   Tonny Bollman, MD    Other Instructions       Cardiac/Peripheral Catheterization   You are scheduled for a Cardiac Catheterization on Tuesday, April 9 with Dr. Tonny Bollman.  1. Please arrive at the Main Entrance A at [facility]: [address] [place], [place] [address] on April 9 at 7:30 AM (This time is four hours before your procedure to ensure your preparation). Free valet parking service is available. You will check in at ADMITTING. The support person will be asked to wait in the waiting room.  It is OK to have someone drop you off and come back when you are ready to be discharged.        Special note: Every effort is made to have your procedure done on time. Please understand that emergencies sometimes delay scheduled procedures.   . 2. Diet: Do not eat solid foods after midnight.  You may have clear liquids until 5 AM the day of the procedure.  3. Labs: Today  4. Medication instructions in preparation for your procedure:   Contrast Allergy: No  DO NOT TAKE Furosemide or Potassium the day before or same day of procedure  DO NOT TAKE Metformin the day of procedure AND must hold for 2 days afterwards (resume Friday)  On the morning of your procedure, take Aspirin 81 mg and any morning medicines NOT listed above.  You may use sips of water.  5. Plan to go home the same day, you will only stay overnight if medically necessary. 6. You MUST have a responsible adult to drive you home. 7. An adult MUST be with you the first 24 hours after you arrive home. 8. Bring a current list of your medications, and the last time and date medication taken. 9. Bring ID and current insurance cards. 10.Please wear clothes that are easy to  get on and off and wear slip-on shoes.  Thank you for allowing [place] to care for you!   -- [facility]    Signed, Tonny Bollman, MD  09/14/2022 5:57 PM    [facility]

## 2022-09-08 NOTE — Patient Instructions (Signed)
Medication Instructions:  Your physician recommends that you continue on your current medications as directed. Please refer to the Current Medication list given to you today.  *If you need a refill on your cardiac medications before your next appointment, please call your pharmacy*   Lab Work: CBC, BMET today If you have labs (blood work) drawn today and your tests are completely normal, you will receive your results only by: [place] (if you have MyChart) OR A paper copy in the mail If you have any lab test that is abnormal or we need to change your treatment, we will call you to review the results.   Testing/Procedures: R & L heart catheterization Your physician has requested that you have a cardiac catheterization. Cardiac catheterization is used to diagnose and/or treat various heart conditions. Doctors may recommend this procedure for a number of different reasons. The most common reason is to evaluate chest pain. Chest pain can be a symptom of coronary artery disease (CAD), and cardiac catheterization can show whether plaque is narrowing or blocking your heart's arteries. This procedure is also used to evaluate the valves, as well as measure the blood flow and oxygen levels in different parts of your heart. For further information please visit [email or web address]. Please follow instruction sheet, as given.  Follow-Up: At [facility], you and your health needs are our priority.  As part of our continuing mission to provide you with exceptional heart care, we have created designated Provider Care Teams.  These Care Teams include your primary Cardiologist (physician) and Advanced Practice Providers (APPs -  Physician Assistants and Nurse Practitioners) who all work together to provide you with the care you need, when you need it.  We recommend signing up for the patient portal called "MyChart".  Sign up information is provided on this After Visit Summary.  MyChart is used to  connect with patients for Virtual Visits (Telemedicine).  Patients are able to view lab/test results, encounter notes, upcoming appointments, etc.  Non-urgent messages can be sent to your provider as well.   To learn more about what you can do with MyChart, go to [email or web address].    Your next appointment:   Structural team will follow-up  Provider:   Sherren Mocha, MD    Other Instructions       Cardiac/Peripheral Catheterization   You are scheduled for a Cardiac Catheterization on Tuesday, April 9 with Dr. Sherren Mocha.  1. Please arrive at the Main Entrance A at [facility]: [place], [place] [address] on April 9 at 7:30 AM (This time is four hours before your procedure to ensure your preparation). Free valet parking service is available. You will check in at ADMITTING. The support person will be asked to wait in the waiting room.  It is OK to have someone drop you off and come back when you are ready to be discharged.        Special note: Every effort is made to have your procedure done on time. Please understand that emergencies sometimes delay scheduled procedures.   . 2. Diet: Do not eat solid foods after midnight.  You may have clear liquids until 5 AM the day of the procedure.  3. Labs: Today  4. Medication instructions in preparation for your procedure:   Contrast Allergy: No  DO NOT TAKE Furosemide or Potassium the day before or same day of procedure  DO NOT TAKE Metformin the day of procedure AND must hold for 2 days afterwards (  resume Friday)  On the morning of your procedure, take Aspirin 81 mg and any morning medicines NOT listed above.  You may use sips of water.  5. Plan to go home the same day, you will only stay overnight if medically necessary. 6. You MUST have a responsible adult to drive you home. 7. An adult MUST be with you the first 24 hours after you arrive home. 8. Bring a current list of your medications, and the  last time and date medication taken. 9. Bring ID and current insurance cards. 10.Please wear clothes that are easy to get on and off and wear slip-on shoes.  Thank you for allowing [place] to care for you!   -- [facility]

## 2022-09-08 NOTE — Progress Notes (Signed)
Pre Surgical Assessment: 5 M Walk Test  47M=16.45ft  5 Meter Walk Test- trial 1: 5.07 seconds 5 Meter Walk Test- trial 2: 4.59 seconds 5 Meter Walk Test- trial 3: 4.06 seconds 5 Meter Walk Test Average: 4.57 seconds

## 2022-09-09 LAB — BASIC METABOLIC PANEL
BUN/Creatinine Ratio: 21 (ref 12–28)
BUN: 21 mg/dL (ref 8–27)
CO2: 21 mmol/L (ref 20–29)
Calcium: 9.9 mg/dL (ref 8.7–10.3)
Chloride: 105 mmol/L (ref 96–106)
Creatinine, Ser: 1.01 mg/dL — ABNORMAL HIGH (ref 0.57–1.00)
Glucose: 84 mg/dL (ref 70–99)
Potassium: 4.4 mmol/L (ref 3.5–5.2)
Sodium: 145 mmol/L — ABNORMAL HIGH (ref 134–144)
eGFR: 59 mL/min/{1.73_m2} — ABNORMAL LOW (ref >59)

## 2022-09-09 LAB — CBC
Hematocrit: 33.9 % — ABNORMAL LOW (ref 34.0–46.6)
Hemoglobin: 10.6 g/dL — ABNORMAL LOW (ref 11.1–15.9)
MCH: 30.1 pg (ref 26.6–33.0)
MCHC: 31.3 g/dL — ABNORMAL LOW (ref 31.5–35.7)
MCV: 96 fL (ref 79–97)
Platelets: 232 10*3/uL (ref 150–450)
RBC: 3.52 x10E6/uL — ABNORMAL LOW (ref 3.77–5.28)
RDW: 17 % — ABNORMAL HIGH (ref 11.7–15.4)
WBC: 8.1 10*3/uL (ref 3.4–10.8)

## 2022-09-11 DIAGNOSIS — M545 Low back pain, unspecified: Secondary | ICD-10-CM | POA: Diagnosis not present

## 2022-09-11 DIAGNOSIS — I2510 Atherosclerotic heart disease of native coronary artery without angina pectoris: Secondary | ICD-10-CM | POA: Diagnosis not present

## 2022-09-11 DIAGNOSIS — G629 Polyneuropathy, unspecified: Secondary | ICD-10-CM | POA: Diagnosis not present

## 2022-09-11 DIAGNOSIS — R011 Cardiac murmur, unspecified: Secondary | ICD-10-CM | POA: Diagnosis not present

## 2022-09-11 DIAGNOSIS — G8929 Other chronic pain: Secondary | ICD-10-CM | POA: Diagnosis not present

## 2022-09-14 ENCOUNTER — Telehealth: Payer: Self-pay | Admitting: *Deleted

## 2022-09-14 ENCOUNTER — Encounter: Payer: Self-pay | Admitting: Cardiovascular Disease

## 2022-09-14 NOTE — Telephone Encounter (Addendum)
Cardiac Catheterization scheduled at [facility] for: Tuesday September 15, 2022 12 Noon Arrival time [facility] Main Entrance A at: 7:30 AM-pre-procedure hydration Copied from 08/31/22 Office Note Dr Callwood-"Chronic renal insufficiency stage III, recommend hydration and follow-up with nephrology"  Nothing to eat after midnight prior to procedure, clear liquids until 5 AM day of procedure.  Medication instructions: -Hold:  Lasix/KCl-day before and day of procedure-per protocol -GFR 59  Metformin-day of procedure and 48 hours post procedure -Other usual morning medications can be taken with sips of water including aspirin 81 mg.  Confirmed patient has responsible adult to drive home post procedure and be with patient first 24 hours after arriving home.  Plan to go home the same day, you will only stay overnight if medically necessary.  Reviewed procedure instructions/pre-procedure hydration with patient.

## 2022-09-15 ENCOUNTER — Ambulatory Visit (HOSPITAL_COMMUNITY)
Admission: RE | Disposition: A | Discharge status: Home / Self Care | Payer: Self-pay | Source: Home / Self Care | Attending: Cardiovascular Disease

## 2022-09-15 ENCOUNTER — Ambulatory Visit (HOSPITAL_COMMUNITY)
Admission: RE | Admit: 2022-09-15 | Discharge: 2022-09-15 | Disposition: A | Discharge status: Home / Self Care | Payer: No Typology Code available for payment source | Attending: Cardiovascular Disease | Admitting: Cardiovascular Disease

## 2022-09-15 ENCOUNTER — Ambulatory Visit (HOSPITAL_BASED_OUTPATIENT_CLINIC_OR_DEPARTMENT_OTHER): Payer: No Typology Code available for payment source

## 2022-09-15 DIAGNOSIS — I083 Combined rheumatic disorders of mitral, aortic and tricuspid valves: Secondary | ICD-10-CM | POA: Diagnosis not present

## 2022-09-15 DIAGNOSIS — R0609 Other forms of dyspnea: Secondary | ICD-10-CM | POA: Diagnosis not present

## 2022-09-15 DIAGNOSIS — R5383 Other fatigue: Secondary | ICD-10-CM | POA: Insufficient documentation

## 2022-09-15 DIAGNOSIS — I350 Nonrheumatic aortic (valve) stenosis: Secondary | ICD-10-CM

## 2022-09-15 DIAGNOSIS — N1831 Chronic kidney disease, stage 3a: Secondary | ICD-10-CM | POA: Insufficient documentation

## 2022-09-15 DIAGNOSIS — Z951 Presence of aortocoronary bypass graft: Secondary | ICD-10-CM | POA: Diagnosis not present

## 2022-09-15 DIAGNOSIS — E1122 Type 2 diabetes mellitus with diabetic chronic kidney disease: Secondary | ICD-10-CM | POA: Insufficient documentation

## 2022-09-15 HISTORY — PX: CORONARY/GRAFT ANGIOGRAPHY: CATH118237

## 2022-09-15 LAB — ECHOCARDIOGRAM COMPLETE
AR max vel: 0.95 cm2
AV Area VTI: 0.99 cm2
AV Area mean vel: 0.92 cm2
AV Mean grad: 38 mmHg
AV Peak grad: 63.7 mmHg
Ao pk vel: 3.99 m/s
Area-P 1/2: 2.78 cm2
Est EF: >75
Height: 63 in
MV M vel: 2.1 m/s
MV Peak grad: 17.6 mmHg
MV VTI: 2.44 cm2
P 1/2 time: 762 msec
S' Lateral: 3.5 cm
Weight: 2560 oz

## 2022-09-15 LAB — GLUCOSE, CAPILLARY
Glucose-Capillary: 96 mg/dL (ref 70–99)
Glucose-Capillary: 81 mg/dL (ref 70–99)

## 2022-09-15 SURGERY — CORONARY/GRAFT ANGIOGRAPHY
Anesthesia: LOCAL

## 2022-09-15 MED ORDER — SODIUM CHLORIDE 0.9 % IV SOLN: 250.0000 mL | INTRAVENOUS | Status: DC | PRN

## 2022-09-15 MED ORDER — LIDOCAINE HCL (PF) 1 % IJ SOLN
INTRAMUSCULAR | Status: AC
  Filled 2022-09-15: qty 30

## 2022-09-15 MED ORDER — HEPARIN (PORCINE) IN NACL 1000-0.9 UT/500ML-% IV SOLN
INTRAVENOUS | Status: DC | PRN
  Administered 2022-09-15 (×2): 500 mL

## 2022-09-15 MED ORDER — SODIUM CHLORIDE 0.9% FLUSH: 3.0000 mL | Freq: Two times a day (BID) | INTRAVENOUS | Status: DC

## 2022-09-15 MED ORDER — HEPARIN SODIUM (PORCINE) 1000 UNIT/ML IJ SOLN
INTRAMUSCULAR | Status: DC | PRN
  Administered 2022-09-15: 4000 [IU] via INTRAVENOUS

## 2022-09-15 MED ORDER — VERAPAMIL HCL 2.5 MG/ML IV SOLN
INTRAVENOUS | Status: AC
  Filled 2022-09-15: qty 2

## 2022-09-15 MED ORDER — HEPARIN SODIUM (PORCINE) 1000 UNIT/ML IJ SOLN
INTRAMUSCULAR | Status: AC
  Filled 2022-09-15: qty 10

## 2022-09-15 MED ORDER — MIDAZOLAM HCL 2 MG/2ML IJ SOLN
INTRAMUSCULAR | Status: DC | PRN
  Administered 2022-09-15: 2 mg via INTRAVENOUS

## 2022-09-15 MED ORDER — ONDANSETRON HCL 4 MG/2ML IJ SOLN: 4.0000 mg | Freq: Four times a day (QID) | INTRAMUSCULAR | Status: DC | PRN

## 2022-09-15 MED ORDER — LABETALOL HCL 5 MG/ML IV SOLN: 10.0000 mg | INTRAVENOUS | Status: DC | PRN

## 2022-09-15 MED ORDER — PERFLUTREN LIPID MICROSPHERE
1.0000 mL | INTRAVENOUS | Status: AC | PRN
  Administered 2022-09-15: 2 mL via INTRAVENOUS

## 2022-09-15 MED ORDER — VERAPAMIL HCL 2.5 MG/ML IV SOLN
INTRAVENOUS | Status: DC | PRN
  Administered 2022-09-15: 10 mL via INTRA_ARTERIAL

## 2022-09-15 MED ORDER — ASPIRIN 81 MG PO CHEW: 81.0000 mg | CHEWABLE_TABLET | ORAL | Status: DC

## 2022-09-15 MED ORDER — SODIUM CHLORIDE 0.9 % WEIGHT BASED INFUSION
3.0000 mL/kg/h | INTRAVENOUS | Status: AC
  Administered 2022-09-15: 3 mL/kg/h via INTRAVENOUS

## 2022-09-15 MED ORDER — SODIUM CHLORIDE 0.9 % IV SOLN: INTRAVENOUS | Status: DC

## 2022-09-15 MED ORDER — FENTANYL CITRATE (PF) 100 MCG/2ML IJ SOLN
INTRAMUSCULAR | Status: DC | PRN
  Administered 2022-09-15: 25 ug via INTRAVENOUS

## 2022-09-15 MED ORDER — SODIUM CHLORIDE 0.9 % WEIGHT BASED INFUSION: 1.0000 mL/kg/h | INTRAVENOUS | Status: DC

## 2022-09-15 MED ORDER — FENTANYL CITRATE (PF) 100 MCG/2ML IJ SOLN
INTRAMUSCULAR | Status: AC
  Filled 2022-09-15: qty 2

## 2022-09-15 MED ORDER — LIDOCAINE HCL (PF) 1 % IJ SOLN
INTRAMUSCULAR | Status: DC | PRN
  Administered 2022-09-15 (×2): 2 mL via INTRADERMAL

## 2022-09-15 MED ORDER — IODIXANOL 320 MG/ML IV SOLN
INTRAVENOUS | Status: DC | PRN
  Administered 2022-09-15: 60 mL

## 2022-09-15 MED ORDER — SODIUM CHLORIDE 0.9% FLUSH: 3.0000 mL | INTRAVENOUS | Status: DC | PRN

## 2022-09-15 MED ORDER — HYDRALAZINE HCL 20 MG/ML IJ SOLN: 10.0000 mg | INTRAMUSCULAR | Status: DC | PRN

## 2022-09-15 MED ORDER — ACETAMINOPHEN 325 MG PO TABS: 650.0000 mg | ORAL_TABLET | ORAL | Status: DC | PRN

## 2022-09-15 MED ORDER — MIDAZOLAM HCL 2 MG/2ML IJ SOLN
INTRAMUSCULAR | Status: AC
  Filled 2022-09-15: qty 2

## 2022-09-15 SURGICAL SUPPLY — 12 items
CATH BALLN WEDGE 5F 110CM (CATHETERS) IMPLANT
CATH INFINITI 5 FR IM (CATHETERS) IMPLANT
CATH INFINITI 5FR MULTPACK ANG (CATHETERS) IMPLANT
DEVICE RAD TR BAND REGULAR (VASCULAR PRODUCTS) IMPLANT
GLIDESHEATH SLEND SS 6F .021 (SHEATH) IMPLANT
GUIDEWIRE INQWIRE 1.5J.035X260 (WIRE) IMPLANT
INQWIRE 1.5J .035X260CM (WIRE) ×1
KIT HEART LEFT (KITS) ×1 IMPLANT
PACK CARDIAC CATHETERIZATION (CUSTOM PROCEDURE TRAY) ×1 IMPLANT
SHEATH GLIDE SLENDER 4/5FR (SHEATH) IMPLANT
TRANSDUCER W/STOPCOCK (MISCELLANEOUS) ×1 IMPLANT
TUBING CIL FLEX 10 FLL-RA (TUBING) ×1 IMPLANT

## 2022-09-15 NOTE — Interval H&P Note (Signed)
History and Physical Interval Note:  09/15/2022 3:14 PM  Renee [name]  has presented today for surgery, with the diagnosis of aortic stenosis.  The various methods of treatment have been discussed with the patient and family. After consideration of risks, benefits and other options for treatment, the patient has consented to  Procedure(s): RIGHT/LEFT HEART CATH AND CORONARY/GRAFT ANGIOGRAPHY (N/A) as a surgical intervention.  The patient's history has been reviewed, patient examined, no change in status, stable for surgery.  I have reviewed the patient's chart and labs.  Questions were answered to the patient's satisfaction.     Tonny Bollman

## 2022-09-15 NOTE — Progress Notes (Signed)
  Echocardiogram 2D Echocardiogram has been performed.  Milda Smart 09/15/2022, 12:13 PM

## 2022-09-15 NOTE — Progress Notes (Signed)
TR BAND REMOVAL  LOCATION:    left radial  DEFLATED PER PROTOCOL:    Yes.    TIME BAND OFF / DRESSING APPLIED: 09/15/22 at 1735   SITE UPON ARRIVAL:    Level 0  SITE AFTER BAND REMOVAL:    Level 0  CIRCULATION SENSATION AND MOVEMENT:    Within Normal Limits   Yes.    COMMENTS:

## 2022-09-16 ENCOUNTER — Encounter (HOSPITAL_COMMUNITY): Payer: Self-pay | Admitting: Cardiovascular Disease

## 2022-09-16 MED FILL — Heparin Sodium (Porcine) Inj 1000 Unit/ML: INTRAMUSCULAR | Qty: 10 | Status: AC

## 2022-09-16 MED FILL — Lidocaine HCl Local Preservative Free (PF) Inj 1%: INTRAMUSCULAR | Qty: 30 | Status: AC

## 2022-09-22 ENCOUNTER — Other Ambulatory Visit: Payer: Self-pay | Admitting: Neurosurgery

## 2022-09-23 ENCOUNTER — Ambulatory Visit (HOSPITAL_COMMUNITY)
Admission: RE | Admit: 2022-09-23 | Discharge: 2022-09-23 | Disposition: A | Discharge status: Home / Self Care | Payer: No Typology Code available for payment source | Source: Ambulatory Visit | Attending: Cardiovascular Disease | Admitting: Cardiovascular Disease

## 2022-09-23 DIAGNOSIS — Z01818 Encounter for other preprocedural examination: Secondary | ICD-10-CM | POA: Diagnosis not present

## 2022-09-23 DIAGNOSIS — I350 Nonrheumatic aortic (valve) stenosis: Secondary | ICD-10-CM

## 2022-09-23 MED ORDER — IOHEXOL 350 MG/ML SOLN
100.0000 mL | Freq: Once | INTRAVENOUS | Status: AC | PRN
  Administered 2022-09-23: 100 mL via INTRAVENOUS

## 2022-09-28 ENCOUNTER — Encounter: Payer: No Typology Code available for payment source | Admitting: Thoracic Surgery (Cardiothoracic Vascular Surgery)

## 2022-09-30 ENCOUNTER — Institutional Professional Consult (permissible substitution): Payer: No Typology Code available for payment source | Admitting: Surgery

## 2022-09-30 ENCOUNTER — Encounter: Payer: Self-pay | Admitting: Surgery

## 2022-09-30 VITALS — BP 155/77 | HR 74 | Resp 18 | Ht 63.0 in | Wt 160.0 lb

## 2022-09-30 DIAGNOSIS — I350 Nonrheumatic aortic (valve) stenosis: Secondary | ICD-10-CM

## 2022-09-30 NOTE — Progress Notes (Signed)
[facility]   [facility]LIN[facility]VE CLINIC       [address] [place].[address]       [name] [address]             [phone]          CARDIOTHORACIC SURGERY CONSULTATION REPORT  PCP is Lynnea Ferrier, MD Referring Provider is Tonny Bollman, MD Primary Cardiologist is Dorothyann Peng, MD  Reason for consultation: Severe aortic stenosis  HPI:  The patient is a 72 year old woman with a history of hypertension, diabetes, stage III chronic kidney disease, coronary artery disease status post CABG x 1 with a LIMA to the LAD by me in 1996, and aortic stenosis that has been followed with periodic echocardiogram by Dr. Juliann Pares.  Her most recent echocardiogram from [facility] clinic showed a mean gradient across aortic valve of 36 mmHg with a peak of 73 mmHg.  Peak velocity was 4.3 m/s.  Aortic valve area was 0.73 cm with a left ventricular ejection fraction of greater than 55%.  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  This has subsequently resolved.  She did receive 2 units of packed red blood cells.  She reports feeling fairly well overall.  She denies any chest pain or pressure.  She stays busy but she does not get any regular exercise or take walks.  She holds onto a cart or rides a scooter at the grocery store.  She has had mild exertional shortness of breath.  She denies any dizziness or syncope.  She denies peripheral edema.  She denies orthopnea.  Past Medical History:  Diagnosis Date   Arthritis    Chronic kidney disease    stage 3   Coronary artery disease    Diabetes mellitus without complication    GERD (gastroesophageal reflux disease)    no meds   Heart murmur    Hypertension     Past Surgical History:  Procedure Laterality Date   ABDOMINAL HYSTERECTOMY     BREAST EXCISIONAL BIOPSY Right 20+ yrs ago   benign   CARDIAC SURGERY     cabg   CARPAL TUNNEL  RELEASE Bilateral    COLONOSCOPY N/A 02/24/2017   Procedure: COLONOSCOPY;  Surgeon: Toledo, Boykin Nearing, MD;  Location: [facility] ENDOSCOPY;  Service: Endoscopy;  Laterality: N/A;   COLONOSCOPY N/A 07/28/2022   Procedure: COLONOSCOPY;  Surgeon: Toledo, Boykin Nearing, MD;  Location: [facility] ENDOSCOPY;  Service: Gastroenterology;  Laterality: N/A;   CORONARY ARTERY BYPASS GRAFT     1996   CORONARY/GRAFT ANGIOGRAPHY N/A 09/15/2022   Procedure: CORONARY/GRAFT ANGIOGRAPHY;  Surgeon: Tonny Bollman, MD;  Location: [facility] INVASIVE CV LAB;  Service: Cardiovascular;  Laterality: N/A;   ESOPHAGOGASTRODUODENOSCOPY (EGD) WITH PROPOFOL N/A 07/27/2022   Procedure: ESOPHAGOGASTRODUODENOSCOPY (EGD) WITH PROPOFOL;  Surgeon: Toledo, Boykin Nearing, MD;  Location: [facility] ENDOSCOPY;  Service: Gastroenterology;  Laterality: N/A;   HERNIA REPAIR     umbilical   LUMBAR LAMINECTOMY/DECOMPRESSION MICRODISCECTOMY N/A 06/25/2021   Procedure: L2-3 DECOMPRESSION;  Surgeon: Venetia Night, MD;  Location: [facility] ORS;  Service: Neurosurgery;  Laterality: N/A;   TOTAL KNEE ARTHROPLASTY Left 10/25/2018   Procedure: TOTAL KNEE ARTHROPLASTY - LEFT - DIABETIC;  Surgeon: Kennedy Bucker, MD;  Location: [facility] ORS;  Service: Orthopedics;  Laterality: Left;    Family History  Problem Relation Age of Onset   Breast cancer Neg Hx     Social History   Socioeconomic History  Marital status: Widowed    Spouse name: Not on file   Number of children: Not on file   Years of education: Not on file   Highest education level: Not on file  Occupational History   Not on file  Tobacco Use   Smoking status: Every Day    Packs/day: 0.25    Years: 35.00    Additional pack years: 0.00    Total pack years: 8.75    Types: Cigarettes   Smokeless tobacco: Never  Vaping Use   Vaping Use: Never used  Substance and Sexual Activity   Alcohol use: Yes   Drug use: No   Sexual activity: Not on file  Other Topics Concern   Not on file  Social History Narrative   Son  lives with her. Spouse died May 20, 2019   Social Determinants of Health   Financial Resource Strain: Not on file  Food Insecurity: No Food Insecurity (07/26/2022)   Hunger Vital Sign    Worried About Running Out of Food in the Last Year: Never true    Ran Out of Food in the Last Year: Never true  Transportation Needs: No Transportation Needs (07/26/2022)   PRAPARE - Administrator, Civil Service (Medical): No    Lack of Transportation (Non-Medical): No  Physical Activity: Not on file  Stress: Not on file  Social Connections: Not on file  Intimate Partner Violence: Not At Risk (07/26/2022)   Humiliation, Afraid, Rape, and Kick questionnaire    Fear of Current or Ex-Partner: No    Emotionally Abused: No    Physically Abused: No    Sexually Abused: No    Prior to Admission medications   Medication Sig Start Date End Date Taking? Authorizing Provider  ACCU-CHEK GUIDE test strip USE TO TEST SUGAR DAILY 08/05/22  Yes [provider]  Accu-Chek Softclix Lancets lancets daily. 08/05/22  Yes [provider]  Alpha-Lipoic Acid 600 MG CAPS Take 600 mg by mouth daily.   Yes [provider]  amLODipine (NORVASC) 5 MG tablet Take 5 mg by mouth daily.   Yes [provider]  aspirin EC 81 MG tablet Take 81 mg by mouth daily. Swallow whole.   Yes [provider]  Azelastine HCl 137 MCG/SPRAY SOLN Place 1 spray into the nose 2 (two) times daily as needed (allergies). 05/29/21  Yes [provider]  Blood Glucose Monitoring Suppl (ACCU-CHEK GUIDE ME) w/Device KIT USE AS DIRECTED TO TEST SUGAR DAILY 08/05/22  Yes [provider]  carvedilol (COREG) 6.25 MG tablet Take 6.25 mg by mouth 2 (two) times a day.    Yes [provider]  Cholecalciferol (VITAMIN D3) 50 MCG (2000 UT) TABS Take 2,000 Units by mouth daily.   Yes [provider]  CINNAMON PO Take 1,000 mg by mouth daily.   Yes [provider]  [employer]S SENNA 8.6  MG tablet TAKE 1 TABLET (8.6 MG TOTAL) BY MOUTH DAILY AS NEEDED FOR MILD CONSTIPATION. 06/25/22  Yes Susanne Borders, PA  EPINEPHrine 0.3 mg/0.3 mL IJ SOAJ injection Inject 0.3 mg into the muscle as needed for anaphylaxis. 05/04/22  Yes [provider]  Ferrous Sulfate (IRON SLOW RELEASE PO) Take 1 tablet by mouth 3 (three) times a week.   Yes [provider]  furosemide (LASIX) 20 MG tablet Take 20 mg by mouth once a week. In the morning   Yes [provider]  gabapentin (NEURONTIN) 800 MG tablet Take 800-1,600 mg by mouth  See admin instructions. Take 1 tablet (800 mg) by mouth in the morning, 1 tablet (800 mg) by mouth at noon, & take 2 tablets (1600 mg) by mouth at bedtime.   Yes [provider]  loratadine (CLARITIN) 10 MG tablet Take 10 mg by mouth daily.   Yes [provider]  metFORMIN (GLUCOPHAGE-XR) 500 MG 24 hr tablet Take 500 mg by mouth daily with breakfast. 03/28/21  Yes [provider]  nitroGLYCERIN (NITROSTAT) 0.4 MG SL tablet Place 0.4 mg under the tongue every 5 (five) minutes as needed for chest pain.   Yes [provider]  Omega-3 Fatty Acids (FISH OIL) 1000 MG CAPS Take 1,000 mg by mouth 3 (three) times daily.   Yes [provider]  potassium chloride SA (KLOR-CON M) 20 MEQ tablet Take 20 mEq by mouth daily. 06/22/22  Yes [provider]  Propylene Glycol (SYSTANE BALANCE) 0.6 % SOLN Place 1 drop into both eyes daily as needed (dry eyes).   Yes [provider]  rosuvastatin (CRESTOR) 20 MG tablet Take 20 mg by mouth at bedtime.   Yes [provider]  tiZANidine (ZANAFLEX) 2 MG tablet Take 1 tablet (2 mg total) by mouth every 8 (eight) hours as needed for muscle spasms. 09/22/22  Yes Susanne Borders, PA    Current Outpatient Medications  Medication Sig Dispense Refill   ACCU-CHEK GUIDE test strip USE TO TEST SUGAR DAILY     Accu-Chek Softclix Lancets lancets daily.      Alpha-Lipoic Acid 600 MG CAPS Take 600 mg by mouth daily.     amLODipine (NORVASC) 5 MG tablet Take 5 mg by mouth daily.     aspirin EC 81 MG tablet Take 81 mg by mouth daily. Swallow whole.     Azelastine HCl 137 MCG/SPRAY SOLN Place 1 spray into the nose 2 (two) times daily as needed (allergies).     Blood Glucose Monitoring Suppl (ACCU-CHEK GUIDE ME) w/Device KIT USE AS DIRECTED TO TEST SUGAR DAILY     carvedilol (COREG) 6.25 MG tablet Take 6.25 mg by mouth 2 (two) times a day.      Cholecalciferol (VITAMIN D3) 50 MCG (2000 UT) TABS Take 2,000 Units by mouth daily.     CINNAMON PO Take 1,000 mg by mouth daily.     CVS SENNA 8.6 MG tablet TAKE 1 TABLET (8.6 MG TOTAL) BY MOUTH DAILY AS NEEDED FOR MILD CONSTIPATION. 30 tablet 0   EPINEPHrine 0.3 mg/0.3 mL IJ SOAJ injection Inject 0.3 mg into the muscle as needed for anaphylaxis.     Ferrous Sulfate (IRON SLOW RELEASE PO) Take 1 tablet by mouth 3 (three) times a week.     furosemide (LASIX) 20 MG tablet Take 20 mg by mouth once a week. In the morning     gabapentin (NEURONTIN) 800 MG tablet Take 800-1,600 mg by mouth See admin instructions. Take 1 tablet (800 mg) by mouth in the morning, 1 tablet (800 mg) by mouth at noon, & take 2 tablets (1600 mg) by mouth at bedtime.     loratadine (CLARITIN) 10 MG tablet Take 10 mg by mouth daily.     metFORMIN (GLUCOPHAGE-XR) 500 MG 24 hr tablet Take 500 mg by mouth daily with breakfast.     nitroGLYCERIN (NITROSTAT) 0.4 MG SL tablet Place 0.4 mg under the tongue every 5 (five) minutes as needed for chest pain.     Omega-3 Fatty Acids (FISH OIL) 1000 MG CAPS Take 1,000 mg by  mouth 3 (three) times daily.     potassium chloride SA (KLOR-CON M) 20 MEQ tablet Take 20 mEq by mouth daily.     Propylene Glycol (SYSTANE BALANCE) 0.6 % SOLN Place 1 drop into both eyes daily as needed (dry eyes).     rosuvastatin (CRESTOR) 20 MG tablet Take 20 mg by mouth at bedtime.     tiZANidine (ZANAFLEX) 2 MG tablet Take 1 tablet  (2 mg total) by mouth every 8 (eight) hours as needed for muscle spasms. 90 tablet 0   No current facility-administered medications for this visit.    Allergies  Allergen Reactions   Venlafaxine Other (See Comments)    intolerant   Codeine Nausea And Vomiting   Tramadol Nausea And Vomiting      Review of Systems:   General:  normal appetite, + decreased energy, no weight gain, no weight loss, no fever  Cardiac:  no chest pain with exertion, no chest pain at rest, +SOB with moderate exertion, no resting SOB, no PND, no orthopnea, no palpitations, no arrhythmia, no atrial fibrillation, no LE edema, no dizzy spells, no syncope  Respiratory:  + exertional shortness of breath, no home oxygen, no productive cough, no dry cough, no bronchitis, no wheezing, no hemoptysis, no asthma, no pain with inspiration or cough, no sleep apnea, no CPAP at night  GI:   no difficulty swallowing, no reflux, no frequent heartburn, no hiatal hernia, no abdominal pain, no constipation, no diarrhea, no hematochezia, no hematemesis, no melena  GU:   no dysuria,  no frequency, no urinary tract infection, no hematuria, no kidney stones, + chronic kidney disease  Vascular:  no pain suggestive of claudication, no pain in feet, no leg cramps, no varicose veins, no DVT, no non-healing foot ulcer  Neuro:   no stroke, no TIA's, no seizures, no headaches, no temporary blindness one eye,  no slurred speech, no peripheral neuropathy, no chronic pain, no instability of gait, no memory/cognitive dysfunction  Musculoskeletal: no arthritis, no joint swelling, no myalgias, no difficulty walking, normal mobility   Skin:   no rash, no itching, no skin infections, no pressure sores or ulcerations  Psych:   no anxiety, no depression, no nervousness, no unusual recent stress  Eyes:   no blurry vision, no floaters, no recent vision changes, + wears glasses   ENT:   no hearing loss, no loose or painful teeth, + dentures, sees dentist  regularly  Hematologic:  no easy bruising, no abnormal bleeding, no clotting disorder, no frequent epistaxis  Endocrine:  + diabetes, does check CBG's at home     Physical Exam:   BP (!) 155/77 (BP Location: Left Arm, Patient Position: Sitting)   Pulse 74   Resp 18   Ht 5\' 3"  (1.6 m)   Wt 160 lb (72.6 kg)   SpO2 96% Comment: RA  BMI 28.34 kg/m   General:  Elderly,  well-appearing  HEENT:  Unremarkable, NCAT, PERLA, EOMI  Neck:   no JVD, no bruits, no adenopathy   Chest:   clear to auscultation, symmetrical breath sounds, no wheezes, no rhonchi   CV:   RRR, 3/6 systolic murmur RSB, no diastolic murmur  Abdomen:  soft, non-tender, no masses   Extremities:  warm, well-perfused, pedal pulses palpable,  lower extremity edema  Rectal/GU  Deferred  Neuro:   Grossly non-focal and symmetrical throughout  Skin:   Clean and dry, no rashes, no breakdown  Diagnostic Tests:      ECHOCARDIOGRAM REPORT  Patient Name:   Renee [name] Date of Exam: 09/15/2022  Medical Rec #:  [id number]     Height:       63.0 in  Accession #:    [id number]    Weight:       160.0 lb  Date of Birth:  [birth year]     BSA:          1.759 m  Patient Age:    71 years      BP:           117/68 mmHg  Patient Gender: F             HR:           65 bpm.  Exam Location:  Inpatient   Procedure: 2D Echo, Color Doppler, Cardiac Doppler and Intracardiac             Opacification Agent   Indications:    Aortic stenosis    History:        Patient has prior history of Echocardiogram examinations,  most                 recent 03/10/2022. CAD, Prior CABG, Signs/Symptoms:Murmur;  Risk                 Factors:Hypertension and Diabetes. CKD.    Sonographer:    Milda Smart  Referring Phys: Tonny Bollman     Sonographer Comments: Image acquisition challenging due to patient body  habitus and Image acquisition challenging due to respiratory motion.  IMPRESSIONS     1. Mildly elevated intracavitary gradient  (1.6 m/s) due to vigorous LV  function. Calcified aortic valve with severe AS (mean gradient 45 mmHg,  peak velocity 4.3 m/s, AVA 0.9 cm2).   2. Left ventricular ejection fraction, by estimation, is >75%. The left  ventricle has hyperdynamic function. The left ventricle has no regional  wall motion abnormalities. There is mild concentric left ventricular  hypertrophy. Left ventricular diastolic  parameters are consistent with Grade I diastolic dysfunction (impaired  relaxation). Elevated left atrial pressure.   3. Right ventricular systolic function is normal. The right ventricular  size is normal.   4. The mitral valve is normal in structure. Trivial mitral valve  regurgitation. No evidence of mitral stenosis. Moderate mitral annular  calcification.   5. Tricuspid valve regurgitation is mild to moderate.   6. The aortic valve is calcified. Aortic valve regurgitation is mild.  Severe aortic valve stenosis.   7. The inferior vena cava is normal in size with greater than 50%  respiratory variability, suggesting right atrial pressure of 3 mmHg.   FINDINGS   Left Ventricle: Left ventricular ejection fraction, by estimation, is  >75%. The left ventricle has hyperdynamic function. The left ventricle has  no regional wall motion abnormalities. Definity contrast agent was given  IV to delineate the left  ventricular endocardial borders. The left ventricular internal cavity size  was normal in size. There is mild concentric left ventricular hypertrophy.  Left ventricular diastolic parameters are consistent with Grade I  diastolic dysfunction (impaired  relaxation). Elevated left atrial pressure.   Right Ventricle: The right ventricular size is normal. Right ventricular  systolic function is normal.   Left Atrium: Left atrial size was normal in size.   Right Atrium: Right atrial size was normal in size.   Pericardium: There is no evidence of pericardial effusion.   Mitral Valve: The  mitral valve is normal in structure. Moderate mitral  annular calcification. Trivial mitral valve regurgitation. No evidence of  mitral valve stenosis. MV peak gradient, 6.0 mmHg. The mean mitral valve  gradient is 3.0 mmHg.   Tricuspid Valve: The tricuspid valve is normal in structure. Tricuspid  valve regurgitation is mild to moderate. No evidence of tricuspid  stenosis.   Aortic Valve: The aortic valve is calcified. Aortic valve regurgitation is  mild. Aortic regurgitation PHT measures 762 msec. Severe aortic stenosis  is present. Aortic valve mean gradient measures 38.0 mmHg. Aortic valve  peak gradient measures 63.7 mmHg.  Aortic valve area, by VTI measures 0.99 cm.   Pulmonic Valve: The pulmonic valve was normal in structure. Pulmonic valve  regurgitation is trivial. No evidence of pulmonic stenosis.   Aorta: The aortic root is normal in size and structure.   Venous: The inferior vena cava is normal in size with greater than 50%  respiratory variability, suggesting right atrial pressure of 3 mmHg.   IAS/Shunts: No atrial level shunt detected by color flow Doppler.   Additional Comments: Mildly elevated intracavitary gradient (1.6 m/s) due  to vigorous LV function. Calcified aortic valve with severe AS (mean  gradient 45 mmHg, peak velocity 4.3 m/s, AVA 0.9 cm2).     LEFT VENTRICLE  PLAX 2D  LVIDd:         4.55 cm   Diastology  LVIDs:         3.50 cm   LV e' medial:    4.87 cm/s  LV PW:         1.30 cm   LV E/e' medial:  17.0  LV IVS:        1.20 cm   LV e' lateral:   6.73 cm/s  LVOT diam:     2.10 cm   LV E/e' lateral: 12.3  LV SV:         95  LV SV Index:   54  LVOT Area:     3.46 cm     RIGHT VENTRICLE            IVC  RV S prime:     9.86 cm/s  IVC diam: 1.40 cm  TAPSE (M-mode): 1.7 cm   LEFT ATRIUM             Index        RIGHT ATRIUM           Index  LA diam:        3.90 cm 2.22 cm/m   RA Area:     16.10 cm  LA Vol (A2C):   48.6 ml 27.63 ml/m  RA  Volume:   38.00 ml  21.61 ml/m  LA Vol (A4C):   45.1 ml 25.62 ml/m  LA Biplane Vol: 47.4 ml 26.95 ml/m   AORTIC VALVE  AV Area (Vmax):    0.95 cm  AV Area (Vmean):   0.92 cm  AV Area (VTI):     0.99 cm  AV Vmax:           399.00 cm/s  AV Vmean:          295.000 cm/s  AV VTI:            0.956 m  AV Peak Grad:      63.7 mmHg  AV Mean Grad:      38.0 mmHg  LVOT Vmax:         110.00 cm/s  LVOT Vmean:        78.100 cm/s  LVOT VTI:          0.273 m  LVOT/AV VTI ratio: 0.29  AI PHT:            762 msec    AORTA  Ao Root diam: 2.80 cm  Ao Asc diam:  3.30 cm   MITRAL VALVE               TRICUSPID VALVE  MV Area (PHT): 2.78 cm    TR Peak grad:   24.6 mmHg  MV Area VTI:   2.44 cm    TR Mean grad:   17.0 mmHg  MV Peak grad:  6.0 mmHg    TR Vmax:        248.00 cm/s  MV Mean grad:  3.0 mmHg    TR Vmean:       200.0 cm/s  MV Vmax:       1.22 m/s  MV Vmean:      81.6 cm/s   SHUNTS  MV Decel Time: 273 msec    Systemic VTI:  0.27 m  MR Peak grad: 17.6 mmHg    Systemic Diam: 2.10 cm  MR Vmax:      210.00 cm/s  MV E velocity: 82.80 cm/s  MV A velocity: 96.90 cm/s  MV E/A ratio:  0.85   Olga Millers MD  Electronically signed by Olga Millers MD  Signature Date/Time: 09/15/2022/12:27:21 PM        Final      hysicians  Panel Physicians Referring Physician Case Authorizing Physician  Tonny Bollman, MD (Primary)     Procedures  CORONARY/GRAFT ANGIOGRAPHY   Conclusion  1.  Widely patent coronary arteries with no obstructive CAD identified 2.  Continued patency of the LIMA to LAD graft 3.  Calcified, restricted aortic valve seen on fluoroscopy with no attempt made to cross the valve (severe AS by echo with mean transvalvular gradient 45 mmHg)   Plan: Continue TAVR evaluation   Indications  Nonrheumatic aortic (valve) stenosis [I35.0 (ICD-10-CM)]   Procedural Details  Technical Details INDICATION: Severe aortic stenosis.  72 year old woman with severe, stage D1  aortic stenosis.  She had remote single-vessel CABG in 1996 by Dr. Laneta Simmers.  She presents today for cardiac catheterization as part of preop assessment for aortic valve replacement.  Echocardiogram demonstrates severe aortic stenosis with mean transaortic gradient of 45 mmHg.  PROCEDURAL DETAILS: There is an indwelling peripheral IV in the right antecubital vein.  I tried to change this out over a wire for a 4/5 French slender sheath but I never could get a wire to pass even though there was good venous return.  Right heart cath is aborted as the patient just had an echocardiogram showing no evidence of right heart failure or pulmonary hypertension.  Attention is turned to the left wrist where radial access is obtained.  The left wrist is prepped, draped, and anesthetized with 1% lidocaine. Using the modified Seldinger technique, a 5/6 French Slender sheath is introduced into the left radial artery. 3 mg of verapamil is administered through the sheath, weight-based unfractionated heparin was administered intravenously. Standard Judkins catheters are used for selective coronary angiography.  A LIMA catheter is used for LIMA angiography.  No attempt is made to cross the aortic valve.  Catheter exchanges are performed over an exchange length guidewire. There are no immediate procedural complications. A TR band is used for radial hemostasis at the completion of the procedure.  The patient was transferred to the post catheterization recovery  area for further monitoring.      Estimated blood loss <50 mL.   During this procedure medications were administered to achieve and maintain moderate conscious sedation while the patient's heart rate, blood pressure, and oxygen saturation were continuously monitored and I was present face-to-face 100% of this time.   Medications (Filter: Administrations occurring from 1503 to 1554 on 09/15/22)  important  Continuous medications are totaled by the amount administered until  09/15/22 1554.   fentaNYL (SUBLIMAZE) injection (mcg)  Total dose: 25 mcg Date/Time Rate/Dose/Volume Action   09/15/22 1518 25 mcg Given   midazolam (VERSED) injection (mg)  Total dose: 2 mg Date/Time Rate/Dose/Volume Action   09/15/22 1518 2 mg Given   lidocaine (PF) (XYLOCAINE) 1 % injection (mL)  Total volume: 4 mL Date/Time Rate/Dose/Volume Action   09/15/22 1527 2 mL Given   1531 2 mL Given   Radial Cocktail/Verapamil only (mL)  Total volume: 10 mL Date/Time Rate/Dose/Volume Action   09/15/22 1533 10 mL Given   heparin sodium (porcine) injection (Units)  Total dose: 4,000 Units Date/Time Rate/Dose/Volume Action   09/15/22 1534 4,000 Units Given   Heparin (Porcine) in NaCl 1000-0.9 UT/500ML-% SOLN (mL)  Total volume: 1,000 mL Date/Time Rate/Dose/Volume Action   09/15/22 1550 500 mL Given   1550 500 mL Given   iodixanol (VISIPAQUE) 320 MG/ML injection (mL)  Total volume: 60 mL Date/Time Rate/Dose/Volume Action   09/15/22 1550 60 mL Given    Sedation Time  Sedation Time Physician-1: 27 minutes 19 seconds Contrast     Administrations occurring from 1503 to 1554 on 09/15/22:  Medication Name Total Dose  iodixanol (VISIPAQUE) 320 MG/ML injection 60 mL   Radiation/Fluoro  Fluoro time: 4.2 (min) DAP: 9038 (mGycm2) Cumulative Air Kerma: 158 (mGy) Complications  Complications documented before study signed (09/15/2022  3:59 PM)   No complications were associated with this study.  Documented by Elmon Else, RT - 09/15/2022  3:48 PM     Coronary Findings  Diagnostic Dominance: Right Left Main  Vessel is angiographically normal.    Left Anterior Descending  The vessel exhibits minimal luminal irregularities. The LAD is patent throughout. The proximal vessel has mild irregularities without stenosis. There is competitive filling in the mid and distal LAD after the insertion of the LIMA graft.    Left Circumflex  Vessel is angiographically normal. The  circumflex is angiographically normal and supplies a large first OM branch with no stenosis.    Right Coronary Artery  The vessel exhibits minimal luminal irregularities. Dominant vessel with no obstructive disease. The RCA is patent throughout and supplies a PDA with no stenosis.    LIMA LIMA Graft To Mid LAD  LIMA graft was visualized by angiography and is normal in caliber. The graft exhibits no disease. The LIMA to LAD graft is patent and beyond the graft insertion site there is competitive filling from native LAD flow.    Intervention   No interventions have been documented.   Coronary Diagrams  Diagnostic Dominance: Right   Narrative & Impression  CLINICAL DATA:  Aortic valve replacement (TAVR), pre-op eval   EXAM: Cardiac TAVR CT   TECHNIQUE: The patient was scanned on a Siemens Force 192 slice scanner. A 120 kV retrospective scan was triggered in the descending thoracic aorta at 111 HU's. Gantry rotation speed was 270 msecs and collimation was .9 mm. The 3D data set was reconstructed in 5% intervals of the R-R cycle. Systolic and diastolic phases were analyzed on a dedicated  work station using MPR, MIP and VRT modes. The patient received OMNIPAQUE IOHEXOL 350 MG/ML SOLN of contrast.   FINDINGS: Aortic Valve:   Tricuspid aortic valve with severely reduced cusp excursion. Severely thickened and severely calcified aortic valve cusps.   AV calcium score: 1241   Virtual Basal Annulus Measurements:   Maximum/Minimum Diameter: 23.2 x 19.6 mm   Perimeter: 66.9 mm   Area:  346 mm2   No significant LVOT calcifications.   Membranous septal length: 5 mm   Based on these measurements, the annulus would be suitable for a 23 mm Sapien 3 valve. Alternatively, Heart Team can consider 26 mm Evolut valve. Recommend Heart Team discussion for valve selection.   Sinus of Valsalva Measurements:   Non-coronary:  29 mm   Right - coronary:  27 mm   Left - coronary:   29 mm   Sinus of Valsalva Height:   Left: 18 mm   Right: 21 mm   Aorta: Conventional 3 vessel branch pattern of aortic arch.   Sinotubular Junction:  26 mm   Ascending Thoracic Aorta:  32 mm   Aortic Arch:  29 mm   Descending Thoracic Aorta:  25 mm   Coronary Artery Height above Annulus:   Left main: 12 mm   Right coronary: 17 mm   Coronary Arteries: The study was performed without use of NTG and insufficient for plaque evaluation. Coronary artery calcium seen in 3 vessel distribution. S/p CABG, patent LIMA to LAD.   Optimum Fluoroscopic Angle for Delivery: LAO 1 CAU 1   OTHER:   Left atrial appendage: No thrombus.   Mitral valve: Degenerative, moderate-severe mitral annular calcifications.   Pulmonary artery: Mildly dilated, 29 mm.   Pulmonary veins: Normal anatomy.   IMPRESSION: 1. Tricuspid aortic valve with severely reduced cusp excursion. Severely thickened and severely calcified aortic valve cusps. 2. Aortic valve calcium score: 1241 3. Annulus area: 346 mm2, suitable for 23 mm Sapien 3 valve. No LVOT calcifications. Membranous septal length 5 mm. 4. Sufficient coronary artery heights from annulus. 5. Optimum fluoroscopic angle for delivery: LAO 1 CAU 1 6. Moderate-severe mitral annular calcifications.   Electronically Signed: By: Weston Brass M.D. On: 09/23/2022 12:39   Narrative & Impression  CLINICAL DATA:  Preop evaluation for aortic valve replacement   EXAM: CT ANGIOGRAPHY CHEST, ABDOMEN AND PELVIS   TECHNIQUE: Non-contrast CT of the chest was initially obtained.   Multidetector CT imaging through the chest, abdomen and pelvis was performed using the standard protocol during bolus administration of intravenous contrast. Multiplanar reconstructed images and MIPs were obtained and reviewed to evaluate the vascular anatomy.   RADIATION DOSE REDUCTION: This exam was performed according to the departmental dose-optimization program which  includes automated exposure control, adjustment of the mA and/or kV according to patient size and/or use of iterative reconstruction technique.   CONTRAST:  OMNIPAQUE IOHEXOL 350 MG/ML SOLN   COMPARISON:  None Available.   FINDINGS: CTA CHEST FINDINGS   Cardiovascular: Normal heart size. No pericardial effusion. Normal caliber thoracic aorta with moderate atherosclerotic disease. Aortic valve thickening and calcifications. Mitral annular calcifications. Coronary artery calcifications status post CABG.   Mediastinum/Nodes: Small hiatal hernia. Thyroid is unremarkable. No enlarged lymph nodes seen in the chest.   Lungs/Pleura: Central airways are patent. No consolidation, pleural effusion or pneumothorax. Small solid pulmonary nodule of the right upper lobe measuring 3 mm on series 5, image 44.   Musculoskeletal: Moderate multilevel degenerative disc disease with endplate sclerosis.  No chest wall abnormality. No acute or significant osseous findings.   CTA ABDOMEN AND PELVIS FINDINGS   Hepatobiliary: No focal liver abnormality is seen. No gallstones, gallbladder wall thickening, or biliary dilatation.   Pancreas: Unremarkable. No pancreatic ductal dilatation or surrounding inflammatory changes.   Spleen: Normal in size without focal abnormality.   Adrenals/Urinary Tract: Bilateral adrenal glands are unremarkable. No hydronephrosis or nephrolithiasis. Small bilateral low-attenuation lesions which are too small to accurately characterize but likely simple cysts, no specific follow-up imaging is recommended. Punctate nonobstructing stone of the lower pole of the right kidney. Bladder is unremarkable.   Stomach/Bowel: Stomach is within normal limits. Diverticulosis. No evidence of bowel wall thickening, distention, or inflammatory changes.   Vascular/lymphatic: Normal caliber abdominal aorta with moderate atherosclerotic disease of the suprarenal abdominal aorta and  severe atherosclerotic disease of the infrarenal abdominal aorta. No enlarged lymph nodes seen in the abdomen or pelvis.   Reproductive: No adnexal mass.   Other: Small fat containing right inguinal hernia. No abdominopelvic ascites.   Musculoskeletal: No aggressive appearing osseous lesions.   VASCULAR MEASUREMENTS PERTINENT TO TAVR:   AORTA:   Minimal Aortic Diameter-11.9 mm   Severity of Aortic Calcification-severe   RIGHT PELVIS:   Right Common Iliac Artery -   Minimal Diameter-6.5 mm   Tortuosity-mild   Calcification-severe   Right External Iliac Artery -   Minimal Diameter-4.2 mm   Tortuosity-mild   Calcification-moderate   Right Common Femoral Artery -   Minimal Diameter-4.6 mm   Tortuosity-none   Calcification-moderate   LEFT PELVIS:   Left Common Iliac Artery -   Minimal Diameter-4.9 mm   Tortuosity-mild   Calcification-severe   Left External Iliac Artery -   Minimal Diameter-6.6 mm   Tortuosity-mild   Calcification-none   Left Common Femoral Artery -   Minimal Diameter-4.5 mm   Tortuosity-none   Calcification-moderate   Review of the MIP images confirms the above findings.   IMPRESSION: 1. Vascular findings and measurements pertinent to potential TAVR procedure, as detailed above. 2. Thickening and calcification of the aortic valve, compatible with reported clinical history of aortic stenosis. 3. 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 4. Small solid pulmonary nodule of the right upper lobe measuring 3 mm. No follow-up needed if patient is low-risk. Non-contrast chest CT can be considered in 12 months if patient is high-risk. This recommendation follows the consensus statement: Guidelines for Management of Incidental Pulmonary Nodules Detected on CT Images: From the Fleischner Society 2017; Radiology 2017; 284:228-243.     Electronically Signed   By: Allegra Lai M.D.   On:  09/23/2022 13:16   STS Surgical risk calculation:   Procedure Type: Isolated AVR PERIOPERATIVE OUTCOME ESTIMATE % Operative Mortality 2.34% Morbidity & Mortality 11.1% Stroke 1.76% Renal Failure 2.01% Reoperation 4.18% Prolonged Ventilation 6.75% Deep Sternal Wound Infection 0.123% Long Hospital Stay (>14 days) 6.89% Short Hospital Stay (<6 days)* 44.6%    Impression:  This 72 year old woman has stage D, severe, symptomatic aortic stenosis with NYHA class II symptoms of exertional fatigue and shortness of breath consistent with chronic diastolic congestive heart failure.  I personally reviewed her 2D echocardiogram, cardiac catheterization, and CTA studies.  Her recent echocardiogram shows a severely calcified aortic valve with restricted leaflet mobility.  The mean gradient was 45 mmHg with a peak gradient of 64 mmHg and a valve area of 0.9 cm.  There was mild aortic insufficiency.  Left ventricular ejection fraction was greater than 75%.  Cardiac  catheterization showed continued patency of the LIMA to the LAD.  There was no other significant coronary disease.  The aortic valve was not crossed.  I agree that aortic valve replacement is indicated in this patient for relief of her symptoms and to prevent progressive left ventricular dysfunction.  Given her age and prior coronary bypass surgery I think that transcatheter aortic valve replacement be the best treatment for her.  Her gated cardiac CTA shows anatomy suitable for TAVR using a 23 mm SAPIEN 3 valve.  Her abdominal and pelvic CTA shows adequate pelvic vascular anatomy to allow transfemoral insertion.  The patient and her sister were counseled at length regarding treatment alternatives for management of severe symptomatic aortic stenosis. The risks and benefits of surgical intervention has been discussed in detail. Long-term prognosis with medical therapy was discussed. Alternative approaches such as conventional surgical aortic valve  replacement, transcatheter aortic valve replacement, and palliative medical therapy were compared and contrasted at length. This discussion was placed in the context of the patient's own specific clinical presentation and past medical history. All of their questions have been addressed.   Following the decision to proceed with transcatheter aortic valve replacement, a discussion was held regarding what types of management strategies would be attempted intraoperatively in the event of life-threatening complications, including whether or not the patient would be considered a candidate for the use of cardiopulmonary bypass and/or conversion to open sternotomy for attempted surgical intervention.  I do not think she is a candidate for emergent sternotomy to manage any intraoperative complications with prior coronary bypass surgery.  The patient is aware of the fact that transient use of cardiopulmonary bypass may be necessary. The patient has been advised of a variety of complications that might develop including but not limited to risks of death, stroke, paravalvular leak, aortic dissection or other major vascular complications, aortic annulus rupture, device embolization, cardiac rupture or perforation, mitral regurgitation, acute myocardial infarction, arrhythmia, heart block or bradycardia requiring permanent pacemaker placement, congestive heart failure, respiratory failure, renal failure, pneumonia, infection, other late complications related to structural valve deterioration or migration, or other complications that might ultimately cause a temporary or permanent loss of functional independence or other long term morbidity. The patient provides full informed consent for the procedure as described and all questions were answered.     Plan:  She will be scheduled for transfemoral TAVR using a SAPIEN 3 valve on 10/13/2022.  I spent 60 minutes performing this consultation and > 50% of this time was spent face  to face counseling and coordinating the care of this patient's severe symptomatic aortic stenosis.   Alleen Borne, MD 09/30/2022

## 2022-10-02 ENCOUNTER — Other Ambulatory Visit: Payer: Self-pay

## 2022-10-02 DIAGNOSIS — I350 Nonrheumatic aortic (valve) stenosis: Secondary | ICD-10-CM

## 2022-10-07 ENCOUNTER — Encounter: Payer: Self-pay | Admitting: Surgery

## 2022-10-09 ENCOUNTER — Ambulatory Visit (HOSPITAL_COMMUNITY)
Admission: RE | Admit: 2022-10-09 | Discharge: 2022-10-09 | Disposition: A | Discharge status: Home / Self Care | Payer: No Typology Code available for payment source | Source: Ambulatory Visit | Attending: Cardiovascular Disease

## 2022-10-09 ENCOUNTER — Other Ambulatory Visit: Payer: Self-pay

## 2022-10-09 ENCOUNTER — Ambulatory Visit (HOSPITAL_COMMUNITY)
Admission: RE | Admit: 2022-10-09 | Discharge: 2022-10-09 | Disposition: A | Discharge status: Home / Self Care | Payer: No Typology Code available for payment source | Source: Ambulatory Visit | Attending: Cardiovascular Disease | Admitting: Cardiovascular Disease

## 2022-10-09 DIAGNOSIS — Z1152 Encounter for screening for COVID-19: Secondary | ICD-10-CM | POA: Insufficient documentation

## 2022-10-09 DIAGNOSIS — I350 Nonrheumatic aortic (valve) stenosis: Secondary | ICD-10-CM | POA: Insufficient documentation

## 2022-10-09 DIAGNOSIS — Z01818 Encounter for other preprocedural examination: Secondary | ICD-10-CM | POA: Diagnosis not present

## 2022-10-09 LAB — CBC
HCT: 34.2 % — ABNORMAL LOW (ref 36.0–46.0)
Hemoglobin: 11.1 g/dL — ABNORMAL LOW (ref 12.0–15.0)
MCH: 32.5 pg (ref 26.0–34.0)
MCHC: 32.5 g/dL (ref 30.0–36.0)
MCV: 100 fL (ref 80.0–100.0)
Platelets: 193 10*3/uL (ref 150–400)
RBC: 3.42 MIL/uL — ABNORMAL LOW (ref 3.87–5.11)
RDW: 17.7 % — ABNORMAL HIGH (ref 11.5–15.5)
WBC: 5.5 10*3/uL (ref 4.0–10.5)
nRBC: 0 % (ref 0.0–0.2)

## 2022-10-09 LAB — COMPREHENSIVE METABOLIC PANEL
ALT: 20 U/L (ref 0–44)
AST: 21 U/L (ref 15–41)
Albumin: 3.7 g/dL (ref 3.5–5.0)
Alkaline Phosphatase: 64 U/L (ref 38–126)
Anion gap: 7 (ref 5–15)
BUN: 24 mg/dL — ABNORMAL HIGH (ref 8–23)
CO2: 25 mmol/L (ref 22–32)
Calcium: 9.4 mg/dL (ref 8.9–10.3)
Chloride: 107 mmol/L (ref 98–111)
Creatinine, Ser: 1.01 mg/dL — ABNORMAL HIGH (ref 0.44–1.00)
GFR, Estimated: 59 mL/min — ABNORMAL LOW (ref >60)
Glucose, Bld: 104 mg/dL — ABNORMAL HIGH (ref 70–99)
Potassium: 4.1 mmol/L (ref 3.5–5.1)
Sodium: 139 mmol/L (ref 135–145)
Total Bilirubin: 0.2 mg/dL — ABNORMAL LOW (ref 0.3–1.2)
Total Protein: 7 g/dL (ref 6.5–8.1)

## 2022-10-09 LAB — URINALYSIS, ROUTINE W REFLEX MICROSCOPIC
Bacteria, UA: NONE SEEN
Bilirubin Urine: NEGATIVE
Glucose, UA: NEGATIVE mg/dL
Hgb urine dipstick: NEGATIVE
Ketones, ur: NEGATIVE mg/dL
Leukocytes,Ua: NEGATIVE
Nitrite: NEGATIVE
Protein, ur: NEGATIVE mg/dL
Specific Gravity, Urine: 1.012 (ref 1.005–1.030)
pH: 5 (ref 5.0–8.0)

## 2022-10-09 LAB — TYPE AND SCREEN
ABO/RH(D): O POS
Antibody Screen: NEGATIVE

## 2022-10-09 LAB — SARS CORONAVIRUS 2 (TAT 6-24 HRS): SARS Coronavirus 2: NEGATIVE

## 2022-10-09 LAB — SURGICAL PCR SCREEN
MRSA, PCR: NEGATIVE
Staphylococcus aureus: NEGATIVE

## 2022-10-09 LAB — PROTIME-INR
INR: 1.2 (ref 0.8–1.2)
Prothrombin Time: 14.8 seconds (ref 11.4–15.2)

## 2022-10-09 NOTE — Progress Notes (Addendum)
Patient signed all consents at PAT lab appointment. CHG soap and instructions were given to patient. CHG surgical prep reviewed with patient and all questions answered.  Pt denies any respiratory illness/infection in the last two months. 

## 2022-10-12 MED ORDER — CEFAZOLIN SODIUM-DEXTROSE 2-4 GM/100ML-% IV SOLN
2.0000 g | INTRAVENOUS | Status: AC
  Administered 2022-10-13: 2 g via INTRAVENOUS
  Filled 2022-10-12: qty 100

## 2022-10-12 MED ORDER — HEPARIN 30,000 UNITS/1000 ML (OHS) CELLSAVER SOLUTION
Status: DC
  Filled 2022-10-12 (×2): qty 1000

## 2022-10-12 MED ORDER — MAGNESIUM SULFATE 50 % IJ SOLN
40.0000 meq | INTRAMUSCULAR | Status: DC
  Filled 2022-10-12 (×2): qty 9.85

## 2022-10-12 MED ORDER — DEXMEDETOMIDINE HCL IN NACL 400 MCG/100ML IV SOLN
0.1000 ug/kg/h | INTRAVENOUS | Status: AC
  Administered 2022-10-13: 72.6 ug via INTRAVENOUS
  Filled 2022-10-12 (×2): qty 100

## 2022-10-12 MED ORDER — POTASSIUM CHLORIDE 2 MEQ/ML IV SOLN
80.0000 meq | INTRAVENOUS | Status: DC
  Filled 2022-10-12 (×2): qty 40

## 2022-10-12 MED ORDER — NOREPINEPHRINE 4 MG/250ML-% IV SOLN
0.0000 ug/min | INTRAVENOUS | Status: DC
  Filled 2022-10-12: qty 250

## 2022-10-12 NOTE — H&P (Signed)
[address]       [name] [address]             [phone]      Cardiothoracic Surgery Admission History and Physical   PCP is Lynnea Ferrier, MD Referring Provider is Tonny Bollman, MD Primary Cardiologist is Dorothyann Peng, MD   Reason for admission: Severe aortic stenosis   HPI:   The patient is a 72 year old woman with a history of hypertension, diabetes, stage III chronic kidney disease, coronary artery disease status post CABG x 1 with a LIMA to the LAD by me in 1996, and aortic stenosis that has been followed with periodic echocardiogram by Dr. Juliann Pares.  Her most recent echocardiogram from [facility] clinic showed a mean gradient across aortic valve of 36 mmHg with a peak of 73 mmHg.  Peak velocity was 4.3 m/s.  Aortic valve area was 0.73 cm with a left ventricular ejection fraction of greater than 55%.   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  This has subsequently resolved.  She did receive 2 units of packed red blood cells.   She reports feeling fairly well overall.  She denies any chest pain or pressure.  She stays busy but she does not get any regular exercise or take walks.  She holds onto a cart or rides a scooter at the grocery store.  She has had mild exertional shortness of breath.  She denies any dizziness or syncope.  She denies peripheral edema.  She denies orthopnea.       Past Medical History:  Diagnosis Date   Arthritis     Chronic kidney disease      stage 3   Coronary artery disease     Diabetes mellitus without complication     GERD (gastroesophageal reflux disease)      no meds   Heart murmur     Hypertension             Past Surgical History:  Procedure Laterality Date   ABDOMINAL HYSTERECTOMY       BREAST EXCISIONAL BIOPSY Right 20+ yrs ago    benign   CARDIAC SURGERY        cabg   CARPAL TUNNEL RELEASE Bilateral     COLONOSCOPY  N/A 02/24/2017    Procedure: COLONOSCOPY;  Surgeon: Toledo, Boykin Nearing, MD;  Location: [facility] ENDOSCOPY;  Service: Endoscopy;  Laterality: N/A;   COLONOSCOPY N/A 07/28/2022    Procedure: COLONOSCOPY;  Surgeon: Toledo, Boykin Nearing, MD;  Location: [facility] ENDOSCOPY;  Service: Gastroenterology;  Laterality: N/A;   CORONARY ARTERY BYPASS GRAFT        1996   CORONARY/GRAFT ANGIOGRAPHY N/A 09/15/2022    Procedure: CORONARY/GRAFT ANGIOGRAPHY;  Surgeon: Tonny Bollman, MD;  Location: [facility] INVASIVE CV LAB;  Service: Cardiovascular;  Laterality: N/A;   ESOPHAGOGASTRODUODENOSCOPY (EGD) WITH PROPOFOL N/A 07/27/2022    Procedure: ESOPHAGOGASTRODUODENOSCOPY (EGD) WITH PROPOFOL;  Surgeon: Toledo, Boykin Nearing, MD;  Location: [facility] ENDOSCOPY;  Service: Gastroenterology;  Laterality: N/A;   HERNIA REPAIR        umbilical   LUMBAR LAMINECTOMY/DECOMPRESSION MICRODISCECTOMY N/A 06/25/2021    Procedure: L2-3 DECOMPRESSION;  Surgeon: Venetia Night, MD;  Location: [facility] ORS;  Service: Neurosurgery;  Laterality: N/A;   TOTAL KNEE ARTHROPLASTY Left 10/25/2018    Procedure: TOTAL KNEE ARTHROPLASTY - LEFT - DIABETIC;  Surgeon: Kennedy Bucker, MD;  Location: [facility] ORS;  Service: Orthopedics;  Laterality: Left;           Family History  Problem Relation Age of Onset   Breast cancer Neg Hx        Social History         Socioeconomic History   Marital status: Widowed      Spouse name: Not on file   Number of children: Not on file   Years of education: Not on file   Highest education level: Not on file  Occupational History   Not on file  Tobacco Use   Smoking status: Every Day      Packs/day: 0.25      Years: 35.00      Additional pack years: 0.00      Total pack years: 8.75      Types: Cigarettes   Smokeless tobacco: Never  Vaping Use   Vaping Use: Never used  Substance and Sexual Activity   Alcohol use: Yes   Drug use: No   Sexual activity: Not on file  Other Topics Concern   Not on file  Social History  Narrative    Son lives with her. Spouse died 2019/06/07    Social Determinants of Health        Financial Resource Strain: Not on file  Food Insecurity: No Food Insecurity (07/26/2022)    Hunger Vital Sign     Worried About Running Out of Food in the Last Year: Never true     Ran Out of Food in the Last Year: Never true  Transportation Needs: No Transportation Needs (07/26/2022)    PRAPARE - Therapist, art (Medical): No     Lack of Transportation (Non-Medical): No  Physical Activity: Not on file  Stress: Not on file  Social Connections: Not on file  Intimate Partner Violence: Not At Risk (07/26/2022)    Humiliation, Afraid, Rape, and Kick questionnaire     Fear of Current or Ex-Partner: No     Emotionally Abused: No     Physically Abused: No     Sexually Abused: No             Prior to Admission medications   Medication Sig Start Date End Date Taking? Authorizing Provider  ACCU-CHEK GUIDE test strip USE TO TEST SUGAR DAILY 08/05/22   Yes [provider]  Accu-Chek Softclix Lancets lancets daily. 08/05/22   Yes [provider]  Alpha-Lipoic Acid 600 MG CAPS Take 600 mg by mouth daily.     Yes [provider]  amLODipine (NORVASC) 5 MG tablet Take 5 mg by mouth daily.     Yes [provider]  aspirin EC 81 MG tablet Take 81 mg by mouth daily. Swallow whole.     Yes [provider]  Azelastine HCl 137 MCG/SPRAY SOLN Place 1 spray into the nose 2 (two) times daily as needed (allergies). 2021/06/06   Yes [provider]  Blood Glucose Monitoring Suppl (ACCU-CHEK GUIDE ME) w/Device KIT USE AS DIRECTED TO TEST SUGAR DAILY 08/05/22   Yes [provider]  carvedilol (COREG) 6.25 MG tablet Take 6.25 mg by mouth 2 (two) times a day.      Yes [provider]  Cholecalciferol (VITAMIN D3) 50 MCG (2000 UT) TABS Take 2,000 Units by mouth daily.     Yes [provider]  CINNAMON PO Take 1,000 mg by  mouth daily.     Yes [provider]  [facility]  8.6 MG tablet TAKE 1 TABLET (8.6 MG TOTAL) BY MOUTH DAILY AS NEEDED FOR MILD CONSTIPATION. 06/25/22   Yes Susanne Borders, PA  EPINEPHrine 0.3 mg/0.3 mL IJ SOAJ injection Inject 0.3 mg into the muscle as needed for anaphylaxis. 05/04/22   Yes [provider]  Ferrous Sulfate (IRON SLOW RELEASE PO) Take 1 tablet by mouth 3 (three) times a week.     Yes [provider]  furosemide (LASIX) 20 MG tablet Take 20 mg by mouth once a week. In the morning     Yes [provider]  gabapentin (NEURONTIN) 800 MG tablet Take 800-1,600 mg by mouth See admin instructions. Take 1 tablet (800 mg) by mouth in the morning, 1 tablet (800 mg) by mouth at noon, & take 2 tablets (1600 mg) by mouth at bedtime.     Yes [provider]  loratadine (CLARITIN) 10 MG tablet Take 10 mg by mouth daily.     Yes [provider]  metFORMIN (GLUCOPHAGE-XR) 500 MG 24 hr tablet Take 500 mg by mouth daily with breakfast. 03/28/21   Yes [provider]  nitroGLYCERIN (NITROSTAT) 0.4 MG SL tablet Place 0.4 mg under the tongue every 5 (five) minutes as needed for chest pain.     Yes [provider]  Omega-3 Fatty Acids (FISH OIL) 1000 MG CAPS Take 1,000 mg by mouth 3 (three) times daily.     Yes [provider]  potassium chloride SA (KLOR-CON M) 20 MEQ tablet Take 20 mEq by mouth daily. 06/22/22   Yes [provider]  Propylene Glycol (SYSTANE BALANCE) 0.6 % SOLN Place 1 drop into both eyes daily as needed (dry eyes).     Yes [provider]  rosuvastatin (CRESTOR) 20 MG tablet Take 20 mg by mouth at bedtime.     Yes [provider]  tiZANidine (ZANAFLEX) 2 MG tablet Take 1 tablet (2 mg total) by mouth every 8 (eight) hours as needed for muscle spasms. 09/22/22   Yes Susanne Borders, PA            Current Outpatient Medications  Medication Sig Dispense Refill   ACCU-CHEK GUIDE  test strip USE TO TEST SUGAR DAILY       Accu-Chek Softclix Lancets lancets daily.       Alpha-Lipoic Acid 600 MG CAPS Take 600 mg by mouth daily.       amLODipine (NORVASC) 5 MG tablet Take 5 mg by mouth daily.       aspirin EC 81 MG tablet Take 81 mg by mouth daily. Swallow whole.       Azelastine HCl 137 MCG/SPRAY SOLN Place 1 spray into the nose 2 (two) times daily as needed (allergies).       Blood Glucose Monitoring Suppl (ACCU-CHEK GUIDE ME) w/Device KIT USE AS DIRECTED TO TEST SUGAR DAILY       carvedilol (COREG) 6.25 MG tablet Take 6.25 mg by mouth 2 (two) times a day.        Cholecalciferol (VITAMIN D3) 50 MCG (2000 UT) TABS Take 2,000 Units by mouth daily.       CINNAMON PO Take 1,000 mg by mouth daily.       [facility]S SENNA 8.6 MG tablet TAKE 1 TABLET (8.6 MG TOTAL) BY MOUTH DAILY AS NEEDED FOR MILD CONSTIPATION. 30 tablet 0   EPINEPHrine 0.3 mg/0.3 mL IJ SOAJ injection Inject 0.3 mg into the muscle as needed for anaphylaxis.  Ferrous Sulfate (IRON SLOW RELEASE PO) Take 1 tablet by mouth 3 (three) times a week.       furosemide (LASIX) 20 MG tablet Take 20 mg by mouth once a week. In the morning       gabapentin (NEURONTIN) 800 MG tablet Take 800-1,600 mg by mouth See admin instructions. Take 1 tablet (800 mg) by mouth in the morning, 1 tablet (800 mg) by mouth at noon, & take 2 tablets (1600 mg) by mouth at bedtime.       loratadine (CLARITIN) 10 MG tablet Take 10 mg by mouth daily.       metFORMIN (GLUCOPHAGE-XR) 500 MG 24 hr tablet Take 500 mg by mouth daily with breakfast.       nitroGLYCERIN (NITROSTAT) 0.4 MG SL tablet Place 0.4 mg under the tongue every 5 (five) minutes as needed for chest pain.       Omega-3 Fatty Acids (FISH OIL) 1000 MG CAPS Take 1,000 mg by mouth 3 (three) times daily.       potassium chloride SA (KLOR-CON M) 20 MEQ tablet Take 20 mEq by mouth daily.       Propylene Glycol (SYSTANE BALANCE) 0.6 % SOLN Place 1 drop into both eyes daily as needed (dry eyes).        rosuvastatin (CRESTOR) 20 MG tablet Take 20 mg by mouth at bedtime.       tiZANidine (ZANAFLEX) 2 MG tablet Take 1 tablet (2 mg total) by mouth every 8 (eight) hours as needed for muscle spasms. 90 tablet 0    No current facility-administered medications for this visit.           Allergies  Allergen Reactions   Venlafaxine Other (See Comments)      intolerant   Codeine Nausea And Vomiting   Tramadol Nausea And Vomiting          Review of Systems:               General:                      normal appetite, + decreased energy, no weight gain, no weight loss, no fever             Cardiac:                       no chest pain with exertion, no chest pain at rest, +SOB with moderate exertion, no resting SOB, no PND, no orthopnea, no palpitations, no arrhythmia, no atrial fibrillation, no LE edema, no dizzy spells, no syncope             Respiratory:                 + exertional shortness of breath, no home oxygen, no productive cough, no dry cough, no bronchitis, no wheezing, no hemoptysis, no asthma, no pain with inspiration or cough, no sleep apnea, no CPAP at night             GI:                               no difficulty swallowing, no reflux, no frequent heartburn, no hiatal hernia, no abdominal pain, no constipation, no diarrhea, no hematochezia, no hematemesis, no melena             GU:  no dysuria,  no frequency, no urinary tract infection, no hematuria, no kidney stones, + chronic kidney disease             Vascular:                     no pain suggestive of claudication, no pain in feet, no leg cramps, no varicose veins, no DVT, no non-healing foot ulcer             Neuro:                         no stroke, no TIA's, no seizures, no headaches, no temporary blindness one eye,  no slurred speech, no peripheral neuropathy, no chronic pain, no instability of gait, no memory/cognitive dysfunction             Musculoskeletal:         no arthritis, no joint  swelling, no myalgias, no difficulty walking, normal mobility              Skin:                            no rash, no itching, no skin infections, no pressure sores or ulcerations             Psych:                         no anxiety, no depression, no nervousness, no unusual recent stress             Eyes:                           no blurry vision, no floaters, no recent vision changes, + wears glasses              ENT:                            no hearing loss, no loose or painful teeth, + dentures, sees dentist regularly             Hematologic:               no easy bruising, no abnormal bleeding, no clotting disorder, no frequent epistaxis             Endocrine:                   + diabetes, does check CBG's at home                            Physical Exam:               BP (!) 155/77 (BP Location: Left Arm, Patient Position: Sitting)   Pulse 74   Resp 18   Ht 5\' 3"  (1.6 m)   Wt 160 lb (72.6 kg)   SpO2 96% Comment: RA  BMI 28.34 kg/m              General:                      Elderly,  well-appearing             HEENT:  Unremarkable, NCAT, PERLA, EOMI             Neck:                           no JVD, no bruits, no adenopathy              Chest:                          clear to auscultation, symmetrical breath sounds, no wheezes, no rhonchi              CV:                              RRR, 3/6 systolic murmur RSB, no diastolic murmur             Abdomen:                    soft, non-tender, no masses              Extremities:                 warm, well-perfused, pedal pulses palpable,  lower extremity edema             Rectal/GU                   Deferred             Neuro:                         Grossly non-focal and symmetrical throughout             Skin:                            Clean and dry, no rashes, no breakdown   Diagnostic Tests:       ECHOCARDIOGRAM REPORT       Patient Name:   EDRIE NEUMAN Date of Exam: 09/15/2022  Medical Rec #:   [id number]     Height:       63.0 in  Accession #:    [id number]    Weight:       160.0 lb  Date of Birth:  05/28/1951     BSA:          1.759 m  Patient Age:    71 years      BP:           117/68 mmHg  Patient Gender: F             HR:           65 bpm.  Exam Location:  Inpatient   Procedure: 2D Echo, Color Doppler, Cardiac Doppler and Intracardiac             Opacification Agent   Indications:    Aortic stenosis    History:        Patient has prior history of Echocardiogram examinations,  most                 recent 03/10/2022. CAD, Prior CABG, Signs/Symptoms:Murmur;  Risk                 Factors:Hypertension and Diabetes. CKD.    Sonographer:    Carollee Herter  O'Grady  Referring Phys: Tonny Bollman     Sonographer Comments: Image acquisition challenging due to patient body  habitus and Image acquisition challenging due to respiratory motion.  IMPRESSIONS     1. Mildly elevated intracavitary gradient (1.6 m/s) due to vigorous LV  function. Calcified aortic valve with severe AS (mean gradient 45 mmHg,  peak velocity 4.3 m/s, AVA 0.9 cm2).   2. Left ventricular ejection fraction, by estimation, is >75%. The left  ventricle has hyperdynamic function. The left ventricle has no regional  wall motion abnormalities. There is mild concentric left ventricular  hypertrophy. Left ventricular diastolic  parameters are consistent with Grade I diastolic dysfunction (impaired  relaxation). Elevated left atrial pressure.   3. Right ventricular systolic function is normal. The right ventricular  size is normal.   4. The mitral valve is normal in structure. Trivial mitral valve  regurgitation. No evidence of mitral stenosis. Moderate mitral annular  calcification.   5. Tricuspid valve regurgitation is mild to moderate.   6. The aortic valve is calcified. Aortic valve regurgitation is mild.  Severe aortic valve stenosis.   7. The inferior vena cava is normal in size with greater than 50%   respiratory variability, suggesting right atrial pressure of 3 mmHg.   FINDINGS   Left Ventricle: Left ventricular ejection fraction, by estimation, is  >75%. The left ventricle has hyperdynamic function. The left ventricle has  no regional wall motion abnormalities. Definity contrast agent was given  IV to delineate the left  ventricular endocardial borders. The left ventricular internal cavity size  was normal in size. There is mild concentric left ventricular hypertrophy.  Left ventricular diastolic parameters are consistent with Grade I  diastolic dysfunction (impaired  relaxation). Elevated left atrial pressure.   Right Ventricle: The right ventricular size is normal. Right ventricular  systolic function is normal.   Left Atrium: Left atrial size was normal in size.   Right Atrium: Right atrial size was normal in size.   Pericardium: There is no evidence of pericardial effusion.   Mitral Valve: The mitral valve is normal in structure. Moderate mitral  annular calcification. Trivial mitral valve regurgitation. No evidence of  mitral valve stenosis. MV peak gradient, 6.0 mmHg. The mean mitral valve  gradient is 3.0 mmHg.   Tricuspid Valve: The tricuspid valve is normal in structure. Tricuspid  valve regurgitation is mild to moderate. No evidence of tricuspid  stenosis.   Aortic Valve: The aortic valve is calcified. Aortic valve regurgitation is  mild. Aortic regurgitation PHT measures 762 msec. Severe aortic stenosis  is present. Aortic valve mean gradient measures 38.0 mmHg. Aortic valve  peak gradient measures 63.7 mmHg.  Aortic valve area, by VTI measures 0.99 cm.   Pulmonic Valve: The pulmonic valve was normal in structure. Pulmonic valve  regurgitation is trivial. No evidence of pulmonic stenosis.   Aorta: The aortic root is normal in size and structure.   Venous: The inferior vena cava is normal in size with greater than 50%  respiratory variability, suggesting  right atrial pressure of 3 mmHg.   IAS/Shunts: No atrial level shunt detected by color flow Doppler.   Additional Comments: Mildly elevated intracavitary gradient (1.6 m/s) due  to vigorous LV function. Calcified aortic valve with severe AS (mean  gradient 45 mmHg, peak velocity 4.3 m/s, AVA 0.9 cm2).     LEFT VENTRICLE  PLAX 2D  LVIDd:         4.55 cm   Diastology  LVIDs:  3.50 cm   LV e' medial:    4.87 cm/s  LV PW:         1.30 cm   LV E/e' medial:  17.0  LV IVS:        1.20 cm   LV e' lateral:   6.73 cm/s  LVOT diam:     2.10 cm   LV E/e' lateral: 12.3  LV SV:         95  LV SV Index:   54  LVOT Area:     3.46 cm     RIGHT VENTRICLE            IVC  RV S prime:     9.86 cm/s  IVC diam: 1.40 cm  TAPSE (M-mode): 1.7 cm   LEFT ATRIUM             Index        RIGHT ATRIUM           Index  LA diam:        3.90 cm 2.22 cm/m   RA Area:     16.10 cm  LA Vol (A2C):   48.6 ml 27.63 ml/m  RA Volume:   38.00 ml  21.61 ml/m  LA Vol (A4C):   45.1 ml 25.62 ml/m  LA Biplane Vol: 47.4 ml 26.95 ml/m   AORTIC VALVE  AV Area (Vmax):    0.95 cm  AV Area (Vmean):   0.92 cm  AV Area (VTI):     0.99 cm  AV Vmax:           399.00 cm/s  AV Vmean:          295.000 cm/s  AV VTI:            0.956 m  AV Peak Grad:      63.7 mmHg  AV Mean Grad:      38.0 mmHg  LVOT Vmax:         110.00 cm/s  LVOT Vmean:        78.100 cm/s  LVOT VTI:          0.273 m  LVOT/AV VTI ratio: 0.29  AI PHT:            762 msec    AORTA  Ao Root diam: 2.80 cm  Ao Asc diam:  3.30 cm   MITRAL VALVE               TRICUSPID VALVE  MV Area (PHT): 2.78 cm    TR Peak grad:   24.6 mmHg  MV Area VTI:   2.44 cm    TR Mean grad:   17.0 mmHg  MV Peak grad:  6.0 mmHg    TR Vmax:        248.00 cm/s  MV Mean grad:  3.0 mmHg    TR Vmean:       200.0 cm/s  MV Vmax:       1.22 m/s  MV Vmean:      81.6 cm/s   SHUNTS  MV Decel Time: 273 msec    Systemic VTI:  0.27 m  MR Peak grad: 17.6 mmHg    Systemic Diam: 2.10  cm  MR Vmax:      210.00 cm/s  MV E velocity: 82.80 cm/s  MV A velocity: 96.90 cm/s  MV E/A ratio:  0.85   Olga Millers MD  Electronically signed by Olga Millers MD  Signature Date/Time: 09/15/2022/12:27:21 PM  Final        hysicians   Panel Physicians Referring Physician Case Authorizing Physician  Tonny Bollman, MD (Primary)        Procedures   CORONARY/GRAFT ANGIOGRAPHY    Conclusion   1.  Widely patent coronary arteries with no obstructive CAD identified 2.  Continued patency of the LIMA to LAD graft 3.  Calcified, restricted aortic valve seen on fluoroscopy with no attempt made to cross the valve (severe AS by echo with mean transvalvular gradient 45 mmHg)   Plan: Continue TAVR evaluation   Indications   Nonrheumatic aortic (valve) stenosis [I35.0 (ICD-10-CM)]    Procedural Details   Technical Details INDICATION: Severe aortic stenosis.  72 year old woman with severe, stage D1 aortic stenosis.  She had remote single-vessel CABG in 1996 by Dr. Laneta Simmers.  She presents today for cardiac catheterization as part of preop assessment for aortic valve replacement.  Echocardiogram demonstrates severe aortic stenosis with mean transaortic gradient of 45 mmHg.  PROCEDURAL DETAILS: There is an indwelling peripheral IV in the right antecubital vein.  I tried to change this out over a wire for a 4/5 French slender sheath but I never could get a wire to pass even though there was good venous return.  Right heart cath is aborted as the patient just had an echocardiogram showing no evidence of right heart failure or pulmonary hypertension.  Attention is turned to the left wrist where radial access is obtained.  The left wrist is prepped, draped, and anesthetized with 1% lidocaine. Using the modified Seldinger technique, a 5/6 French Slender sheath is introduced into the left radial artery. 3 mg of verapamil is administered through the sheath, weight-based unfractionated heparin  was administered intravenously. Standard Judkins catheters are used for selective coronary angiography.  A LIMA catheter is used for LIMA angiography.  No attempt is made to cross the aortic valve.  Catheter exchanges are performed over an exchange length guidewire. There are no immediate procedural complications. A TR band is used for radial hemostasis at the completion of the procedure.  The patient was transferred to the post catheterization recovery area for further monitoring.      Estimated blood loss <50 mL.   During this procedure medications were administered to achieve and maintain moderate conscious sedation while the patient's heart rate, blood pressure, and oxygen saturation were continuously monitored and I was present face-to-face 100% of this time.    Medications (Filter: Administrations occurring from 1503 to 1554 on 09/15/22)  important  Continuous medications are totaled by the amount administered until 09/15/22 1554.    fentaNYL (SUBLIMAZE) injection (mcg)  Total dose: 25 mcg Date/Time Rate/Dose/Volume Action    09/15/22 1518 25 mcg Given    midazolam (VERSED) injection (mg)  Total dose: 2 mg Date/Time Rate/Dose/Volume Action    09/15/22 1518 2 mg Given    lidocaine (PF) (XYLOCAINE) 1 % injection (mL)  Total volume: 4 mL Date/Time Rate/Dose/Volume Action    09/15/22 1527 2 mL Given    1531 2 mL Given    Radial Cocktail/Verapamil only (mL)  Total volume: 10 mL Date/Time Rate/Dose/Volume Action    09/15/22 1533 10 mL Given    heparin sodium (porcine) injection (Units)  Total dose: 4,000 Units Date/Time Rate/Dose/Volume Action    09/15/22 1534 4,000 Units Given    Heparin (Porcine) in NaCl 1000-0.9 UT/500ML-% SOLN (mL)  Total volume: 1,000 mL Date/Time Rate/Dose/Volume Action    09/15/22 1550 500 mL Given    1550 500 mL  Given    iodixanol (VISIPAQUE) 320 MG/ML injection (mL)  Total volume: 60 mL Date/Time Rate/Dose/Volume Action    09/15/22 1550 60 mL Given       Sedation Time   Sedation Time Physician-1: 27 minutes 19 seconds Contrast        Administrations occurring from 1503 to 1554 on 09/15/22:  Medication Name Total Dose  iodixanol (VISIPAQUE) 320 MG/ML injection 60 mL    Radiation/Fluoro   Fluoro time: 4.2 (min) DAP: 9038 (mGycm2) Cumulative Air Kerma: 158 (mGy) Complications   Complications documented before study signed (09/15/2022  3:59 PM)    No complications were associated with this study.  Documented by Elmon Else, RT - 09/15/2022  3:48 PM      Coronary Findings   Diagnostic Dominance: Right Left Main  Vessel is angiographically normal.    Left Anterior Descending  The vessel exhibits minimal luminal irregularities. The LAD is patent throughout. The proximal vessel has mild irregularities without stenosis. There is competitive filling in the mid and distal LAD after the insertion of the LIMA graft.    Left Circumflex  Vessel is angiographically normal. The circumflex is angiographically normal and supplies a large first OM branch with no stenosis.    Right Coronary Artery  The vessel exhibits minimal luminal irregularities. Dominant vessel with no obstructive disease. The RCA is patent throughout and supplies a PDA with no stenosis.    LIMA LIMA Graft To Mid LAD  LIMA graft was visualized by angiography and is normal in caliber. The graft exhibits no disease. The LIMA to LAD graft is patent and beyond the graft insertion site there is competitive filling from native LAD flow.    Intervention    No interventions have been documented.    Coronary Diagrams   Diagnostic Dominance: Right    Narrative & Impression  CLINICAL DATA:  Aortic valve replacement (TAVR), pre-op eval   EXAM: Cardiac TAVR CT   TECHNIQUE: The patient was scanned on a Siemens Force 192 slice scanner. A 120 kV retrospective scan was triggered in the descending thoracic aorta at 111 HU's. Gantry rotation speed was 270 msecs and  collimation was .9 mm. The 3D data set was reconstructed in 5% intervals of the R-R cycle. Systolic and diastolic phases were analyzed on a dedicated work station using MPR, MIP and VRT modes. The patient received OMNIPAQUE IOHEXOL 350 MG/ML SOLN of contrast.   FINDINGS: Aortic Valve:   Tricuspid aortic valve with severely reduced cusp excursion. Severely thickened and severely calcified aortic valve cusps.   AV calcium score: 1241   Virtual Basal Annulus Measurements:   Maximum/Minimum Diameter: 23.2 x 19.6 mm   Perimeter: 66.9 mm   Area:  346 mm2   No significant LVOT calcifications.   Membranous septal length: 5 mm   Based on these measurements, the annulus would be suitable for a 23 mm Sapien 3 valve. Alternatively, Heart Team can consider 26 mm Evolut valve. Recommend Heart Team discussion for valve selection.   Sinus of Valsalva Measurements:   Non-coronary:  29 mm   Right - coronary:  27 mm   Left - coronary:  29 mm   Sinus of Valsalva Height:   Left: 18 mm   Right: 21 mm   Aorta: Conventional 3 vessel branch pattern of aortic arch.   Sinotubular Junction:  26 mm   Ascending Thoracic Aorta:  32 mm   Aortic Arch:  29 mm   Descending Thoracic Aorta:  25  mm   Coronary Artery Height above Annulus:   Left main: 12 mm   Right coronary: 17 mm   Coronary Arteries: The study was performed without use of NTG and insufficient for plaque evaluation. Coronary artery calcium seen in 3 vessel distribution. S/p CABG, patent LIMA to LAD.   Optimum Fluoroscopic Angle for Delivery: LAO 1 CAU 1   OTHER:   Left atrial appendage: No thrombus.   Mitral valve: Degenerative, moderate-severe mitral annular calcifications.   Pulmonary artery: Mildly dilated, 29 mm.   Pulmonary veins: Normal anatomy.   IMPRESSION: 1. Tricuspid aortic valve with severely reduced cusp excursion. Severely thickened and severely calcified aortic valve cusps. 2. Aortic  valve calcium score: 1241 3. Annulus area: 346 mm2, suitable for 23 mm Sapien 3 valve. No LVOT calcifications. Membranous septal length 5 mm. 4. Sufficient coronary artery heights from annulus. 5. Optimum fluoroscopic angle for delivery: LAO 1 CAU 1 6. Moderate-severe mitral annular calcifications.   Electronically Signed: By: Weston Brass M.D. On: 09/23/2022 12:39    Narrative & Impression  CLINICAL DATA:  Preop evaluation for aortic valve replacement   EXAM: CT ANGIOGRAPHY CHEST, ABDOMEN AND PELVIS   TECHNIQUE: Non-contrast CT of the chest was initially obtained.   Multidetector CT imaging through the chest, abdomen and pelvis was performed using the standard protocol during bolus administration of intravenous contrast. Multiplanar reconstructed images and MIPs were obtained and reviewed to evaluate the vascular anatomy.   RADIATION DOSE REDUCTION: This exam was performed according to the departmental dose-optimization program which includes automated exposure control, adjustment of the mA and/or kV according to patient size and/or use of iterative reconstruction technique.   CONTRAST:  OMNIPAQUE IOHEXOL 350 MG/ML SOLN   COMPARISON:  None Available.   FINDINGS: CTA CHEST FINDINGS   Cardiovascular: Normal heart size. No pericardial effusion. Normal caliber thoracic aorta with moderate atherosclerotic disease. Aortic valve thickening and calcifications. Mitral annular calcifications. Coronary artery calcifications status post CABG.   Mediastinum/Nodes: Small hiatal hernia. Thyroid is unremarkable. No enlarged lymph nodes seen in the chest.   Lungs/Pleura: Central airways are patent. No consolidation, pleural effusion or pneumothorax. Small solid pulmonary nodule of the right upper lobe measuring 3 mm on series 5, image 44.   Musculoskeletal: Moderate multilevel degenerative disc disease with endplate sclerosis. No chest wall abnormality. No acute  or significant osseous findings.   CTA ABDOMEN AND PELVIS FINDINGS   Hepatobiliary: No focal liver abnormality is seen. No gallstones, gallbladder wall thickening, or biliary dilatation.   Pancreas: Unremarkable. No pancreatic ductal dilatation or surrounding inflammatory changes.   Spleen: Normal in size without focal abnormality.   Adrenals/Urinary Tract: Bilateral adrenal glands are unremarkable. No hydronephrosis or nephrolithiasis. Small bilateral low-attenuation lesions which are too small to accurately characterize but likely simple cysts, no specific follow-up imaging is recommended. Punctate nonobstructing stone of the lower pole of the right kidney. Bladder is unremarkable.   Stomach/Bowel: Stomach is within normal limits. Diverticulosis. No evidence of bowel wall thickening, distention, or inflammatory changes.   Vascular/lymphatic: Normal caliber abdominal aorta with moderate atherosclerotic disease of the suprarenal abdominal aorta and severe atherosclerotic disease of the infrarenal abdominal aorta. No enlarged lymph nodes seen in the abdomen or pelvis.   Reproductive: No adnexal mass.   Other: Small fat containing right inguinal hernia. No abdominopelvic ascites.   Musculoskeletal: No aggressive appearing osseous lesions.   VASCULAR MEASUREMENTS PERTINENT TO TAVR:   AORTA:   Minimal Aortic Diameter-11.9 mm   Severity of  Aortic Calcification-severe   RIGHT PELVIS:   Right Common Iliac Artery -   Minimal Diameter-6.5 mm   Tortuosity-mild   Calcification-severe   Right External Iliac Artery -   Minimal Diameter-4.2 mm   Tortuosity-mild   Calcification-moderate   Right Common Femoral Artery -   Minimal Diameter-4.6 mm   Tortuosity-none   Calcification-moderate   LEFT PELVIS:   Left Common Iliac Artery -   Minimal Diameter-4.9 mm   Tortuosity-mild   Calcification-severe   Left External Iliac Artery -   Minimal Diameter-6.6  mm   Tortuosity-mild   Calcification-none   Left Common Femoral Artery -   Minimal Diameter-4.5 mm   Tortuosity-none   Calcification-moderate   Review of the MIP images confirms the above findings.   IMPRESSION: 1. Vascular findings and measurements pertinent to potential TAVR procedure, as detailed above. 2. Thickening and calcification of the aortic valve, compatible with reported clinical history of aortic stenosis. 3. 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 4. Small solid pulmonary nodule of the right upper lobe measuring 3 mm. No follow-up needed if patient is low-risk. Non-contrast chest CT can be considered in 12 months if patient is high-risk. This recommendation follows the consensus statement: Guidelines for Management of Incidental Pulmonary Nodules Detected on CT Images: From the Fleischner Society 2017; Radiology 2017; 284:228-243.     Electronically Signed   By: Allegra Lai M.D.   On: 09/23/2022 13:16     STS Surgical risk calculation:    Procedure Type: Isolated AVR PERIOPERATIVE OUTCOME     ESTIMATE % Operative Mortality         2.34% Morbidity & Mortality      11.1% Stroke  1.76% Renal Failure            2.01% Reoperation            4.18% Prolonged Ventilation      6.75% Deep Sternal Wound Infection        0.123% Long Hospital Stay (>14 days)       6.89% Short Hospital Stay (<6 days)*        44.6%      Impression:   This 72 year old woman has stage D, severe, symptomatic aortic stenosis with NYHA class II symptoms of exertional fatigue and shortness of breath consistent with chronic diastolic congestive heart failure.  I personally reviewed her 2D echocardiogram, cardiac catheterization, and CTA studies.  Her recent echocardiogram shows a severely calcified aortic valve with restricted leaflet mobility.  The mean gradient was 45 mmHg with a peak gradient of 64 mmHg and a valve area of 0.9 cm.  There was  mild aortic insufficiency.  Left ventricular ejection fraction was greater than 75%.  Cardiac catheterization showed continued patency of the LIMA to the LAD.  There was no other significant coronary disease.  The aortic valve was not crossed.  I agree that aortic valve replacement is indicated in this patient for relief of her symptoms and to prevent progressive left ventricular dysfunction.  Given her age and prior coronary bypass surgery I think that transcatheter aortic valve replacement be the best treatment for her.  Her gated cardiac CTA shows anatomy suitable for TAVR using a 23 mm SAPIEN 3 valve.  Her abdominal and pelvic CTA shows adequate pelvic vascular anatomy to allow transfemoral insertion.   The patient and her sister were counseled at length regarding treatment alternatives for management of severe symptomatic aortic stenosis. The risks and benefits of surgical intervention has  been discussed in detail. Long-term prognosis with medical therapy was discussed. Alternative approaches such as conventional surgical aortic valve replacement, transcatheter aortic valve replacement, and palliative medical therapy were compared and contrasted at length. This discussion was placed in the context of the patient's own specific clinical presentation and past medical history. All of their questions have been addressed.    Following the decision to proceed with transcatheter aortic valve replacement, a discussion was held regarding what types of management strategies would be attempted intraoperatively in the event of life-threatening complications, including whether or not the patient would be considered a candidate for the use of cardiopulmonary bypass and/or conversion to open sternotomy for attempted surgical intervention.  I do not think she is a candidate for emergent sternotomy to manage any intraoperative complications with prior coronary bypass surgery.  The patient is aware of the fact that transient  use of cardiopulmonary bypass may be necessary. The patient has been advised of a variety of complications that might develop including but not limited to risks of death, stroke, paravalvular leak, aortic dissection or other major vascular complications, aortic annulus rupture, device embolization, cardiac rupture or perforation, mitral regurgitation, acute myocardial infarction, arrhythmia, heart block or bradycardia requiring permanent pacemaker placement, congestive heart failure, respiratory failure, renal failure, pneumonia, infection, other late complications related to structural valve deterioration or migration, or other complications that might ultimately cause a temporary or permanent loss of functional independence or other long term morbidity. The patient provides full informed consent for the procedure as described and all questions were answered.     Plan:   Transfemoral TAVR using a SAPIEN 3 valve.    Alleen Borne, MD

## 2022-10-13 ENCOUNTER — Inpatient Hospital Stay (HOSPITAL_COMMUNITY): Payer: No Typology Code available for payment source | Admitting: Physician Assistant

## 2022-10-13 ENCOUNTER — Inpatient Hospital Stay (HOSPITAL_COMMUNITY): Payer: No Typology Code available for payment source | Admitting: Certified Registered"

## 2022-10-13 ENCOUNTER — Inpatient Hospital Stay (HOSPITAL_COMMUNITY)
Admission: RE | Admit: 2022-10-13 | Discharge: 2022-10-14 | DRG: 267 | Disposition: A | Discharge status: Home / Self Care | Payer: No Typology Code available for payment source | Attending: Cardiovascular Disease | Admitting: Cardiovascular Disease

## 2022-10-13 ENCOUNTER — Other Ambulatory Visit: Payer: Self-pay

## 2022-10-13 ENCOUNTER — Encounter (HOSPITAL_COMMUNITY): Payer: Self-pay | Admitting: Cardiovascular Disease

## 2022-10-13 ENCOUNTER — Other Ambulatory Visit: Payer: Self-pay | Admitting: Physician Assistant

## 2022-10-13 ENCOUNTER — Encounter (HOSPITAL_COMMUNITY)
Admission: RE | Disposition: A | Discharge status: Home / Self Care | Payer: Self-pay | Source: Home / Self Care | Attending: Cardiovascular Disease

## 2022-10-13 ENCOUNTER — Inpatient Hospital Stay (HOSPITAL_COMMUNITY): Payer: No Typology Code available for payment source

## 2022-10-13 DIAGNOSIS — R911 Solitary pulmonary nodule: Secondary | ICD-10-CM | POA: Diagnosis present

## 2022-10-13 DIAGNOSIS — M1990 Unspecified osteoarthritis, unspecified site: Secondary | ICD-10-CM | POA: Diagnosis present

## 2022-10-13 DIAGNOSIS — I350 Nonrheumatic aortic (valve) stenosis: Secondary | ICD-10-CM

## 2022-10-13 DIAGNOSIS — N1831 Chronic kidney disease, stage 3a: Secondary | ICD-10-CM | POA: Diagnosis present

## 2022-10-13 DIAGNOSIS — Z885 Allergy status to narcotic agent status: Secondary | ICD-10-CM

## 2022-10-13 DIAGNOSIS — I060 Rheumatic aortic stenosis: Principal | ICD-10-CM | POA: Diagnosis present

## 2022-10-13 DIAGNOSIS — Z6828 Body mass index (BMI) 28.0-28.9, adult: Secondary | ICD-10-CM

## 2022-10-13 DIAGNOSIS — E1149 Type 2 diabetes mellitus with other diabetic neurological complication: Secondary | ICD-10-CM | POA: Diagnosis not present

## 2022-10-13 DIAGNOSIS — E1122 Type 2 diabetes mellitus with diabetic chronic kidney disease: Secondary | ICD-10-CM | POA: Diagnosis not present

## 2022-10-13 DIAGNOSIS — I2510 Atherosclerotic heart disease of native coronary artery without angina pectoris: Secondary | ICD-10-CM | POA: Diagnosis not present

## 2022-10-13 DIAGNOSIS — I129 Hypertensive chronic kidney disease with stage 1 through stage 4 chronic kidney disease, or unspecified chronic kidney disease: Secondary | ICD-10-CM | POA: Diagnosis present

## 2022-10-13 DIAGNOSIS — F17210 Nicotine dependence, cigarettes, uncomplicated: Secondary | ICD-10-CM | POA: Diagnosis present

## 2022-10-13 DIAGNOSIS — Z96652 Presence of left artificial knee joint: Secondary | ICD-10-CM | POA: Diagnosis present

## 2022-10-13 DIAGNOSIS — Z7984 Long term (current) use of oral hypoglycemic drugs: Secondary | ICD-10-CM | POA: Diagnosis not present

## 2022-10-13 DIAGNOSIS — Z7982 Long term (current) use of aspirin: Secondary | ICD-10-CM

## 2022-10-13 DIAGNOSIS — Z952 Presence of prosthetic heart valve: Secondary | ICD-10-CM

## 2022-10-13 DIAGNOSIS — I5032 Chronic diastolic (congestive) heart failure: Secondary | ICD-10-CM | POA: Diagnosis present

## 2022-10-13 DIAGNOSIS — Z951 Presence of aortocoronary bypass graft: Secondary | ICD-10-CM

## 2022-10-13 DIAGNOSIS — E663 Overweight: Secondary | ICD-10-CM | POA: Diagnosis present

## 2022-10-13 DIAGNOSIS — Z79899 Other long term (current) drug therapy: Secondary | ICD-10-CM | POA: Diagnosis not present

## 2022-10-13 DIAGNOSIS — Z006 Encounter for examination for normal comparison and control in clinical research program: Secondary | ICD-10-CM

## 2022-10-13 DIAGNOSIS — I10 Essential (primary) hypertension: Secondary | ICD-10-CM | POA: Diagnosis present

## 2022-10-13 DIAGNOSIS — Z888 Allergy status to other drugs, medicaments and biological substances status: Secondary | ICD-10-CM | POA: Diagnosis not present

## 2022-10-13 DIAGNOSIS — E1142 Type 2 diabetes mellitus with diabetic polyneuropathy: Secondary | ICD-10-CM | POA: Diagnosis present

## 2022-10-13 DIAGNOSIS — F17200 Nicotine dependence, unspecified, uncomplicated: Secondary | ICD-10-CM | POA: Diagnosis not present

## 2022-10-13 HISTORY — PX: TRANSCATHETER AORTIC VALVE REPLACEMENT, TRANSFEMORAL: SHX6400

## 2022-10-13 HISTORY — DX: Nonrheumatic aortic (valve) stenosis: I35.0

## 2022-10-13 HISTORY — DX: Presence of prosthetic heart valve: Z95.2

## 2022-10-13 HISTORY — PX: INTRAOPERATIVE TRANSTHORACIC ECHOCARDIOGRAM: SHX6523

## 2022-10-13 LAB — GLUCOSE, CAPILLARY
Glucose-Capillary: 101 mg/dL — ABNORMAL HIGH (ref 70–99)
Glucose-Capillary: 134 mg/dL — ABNORMAL HIGH (ref 70–99)
Glucose-Capillary: 94 mg/dL (ref 70–99)

## 2022-10-13 LAB — POCT I-STAT, CHEM 8
BUN: 27 mg/dL — ABNORMAL HIGH (ref 8–23)
Calcium, Ion: 1.3 mmol/L (ref 1.15–1.40)
Chloride: 109 mmol/L (ref 98–111)
Creatinine, Ser: 0.8 mg/dL (ref 0.44–1.00)
Glucose, Bld: 118 mg/dL — ABNORMAL HIGH (ref 70–99)
HCT: 29 % — ABNORMAL LOW (ref 36.0–46.0)
Hemoglobin: 9.9 g/dL — ABNORMAL LOW (ref 12.0–15.0)
Potassium: 4.4 mmol/L (ref 3.5–5.1)
Sodium: 143 mmol/L (ref 135–145)
TCO2: 24 mmol/L (ref 22–32)

## 2022-10-13 LAB — ECHOCARDIOGRAM LIMITED
AR max vel: 3.03 cm2
AV Area VTI: 2.82 cm2
AV Area mean vel: 3.03 cm2
AV Mean grad: 4 mmHg
AV Peak grad: 7.4 mmHg
Ao pk vel: 1.36 m/s
S' Lateral: 2.7 cm

## 2022-10-13 LAB — POCT ACTIVATED CLOTTING TIME
Activated Clotting Time: 147 seconds
Activated Clotting Time: 331 seconds

## 2022-10-13 SURGERY — IMPLANTATION, AORTIC VALVE, TRANSCATHETER, FEMORAL APPROACH
Anesthesia: Monitor Anesthesia Care | Laterality: Right

## 2022-10-13 MED ORDER — OXYCODONE HCL 5 MG PO TABS
5.0000 mg | ORAL_TABLET | ORAL | Status: DC | PRN
  Administered 2022-10-13 – 2022-10-14 (×2): 10 mg via ORAL
  Filled 2022-10-13 (×2): qty 2

## 2022-10-13 MED ORDER — SODIUM CHLORIDE 0.9% FLUSH: 3.0000 mL | Freq: Two times a day (BID) | INTRAVENOUS | Status: DC

## 2022-10-13 MED ORDER — ACETAMINOPHEN 650 MG RE SUPP: 650.0000 mg | Freq: Four times a day (QID) | RECTAL | Status: DC | PRN

## 2022-10-13 MED ORDER — LIDOCAINE HCL (PF) 1 % IJ SOLN
INTRAMUSCULAR | Status: DC | PRN
  Administered 2022-10-13 (×2): 10 mL

## 2022-10-13 MED ORDER — LIDOCAINE HCL (PF) 1 % IJ SOLN
INTRAMUSCULAR | Status: AC
  Filled 2022-10-13: qty 30

## 2022-10-13 MED ORDER — CEFAZOLIN SODIUM-DEXTROSE 2-4 GM/100ML-% IV SOLN
2.0000 g | Freq: Three times a day (TID) | INTRAVENOUS | Status: AC
  Administered 2022-10-13 (×2): 2 g via INTRAVENOUS
  Filled 2022-10-13 (×2): qty 100

## 2022-10-13 MED ORDER — ONDANSETRON HCL 4 MG/2ML IJ SOLN
INTRAMUSCULAR | Status: DC | PRN
  Administered 2022-10-13: 4 mg via INTRAVENOUS

## 2022-10-13 MED ORDER — HEPARIN SODIUM (PORCINE) 1000 UNIT/ML IJ SOLN
INTRAMUSCULAR | Status: DC | PRN
  Administered 2022-10-13: 11000 [IU] via INTRAVENOUS

## 2022-10-13 MED ORDER — ONDANSETRON HCL 4 MG/2ML IJ SOLN: 4.0000 mg | Freq: Four times a day (QID) | INTRAMUSCULAR | Status: DC | PRN

## 2022-10-13 MED ORDER — INSULIN ASPART 100 UNIT/ML IJ SOLN
0.0000 [IU] | Freq: Three times a day (TID) | INTRAMUSCULAR | Status: DC
  Administered 2022-10-13: 2 [IU] via SUBCUTANEOUS

## 2022-10-13 MED ORDER — SODIUM CHLORIDE 0.9 % IV SOLN: 250.0000 mL | INTRAVENOUS | Status: DC | PRN

## 2022-10-13 MED ORDER — ASPIRIN 81 MG PO TBEC
81.0000 mg | DELAYED_RELEASE_TABLET | Freq: Every day | ORAL | Status: DC
  Administered 2022-10-13 – 2022-10-14 (×2): 81 mg via ORAL
  Filled 2022-10-13 (×2): qty 1

## 2022-10-13 MED ORDER — CHLORHEXIDINE GLUCONATE 4 % EX SOLN: 60.0000 mL | Freq: Once | CUTANEOUS | Status: DC

## 2022-10-13 MED ORDER — SODIUM CHLORIDE 0.9 % IV SOLN: INTRAVENOUS | Status: AC

## 2022-10-13 MED ORDER — LACTATED RINGERS IV SOLN: INTRAVENOUS | Status: DC

## 2022-10-13 MED ORDER — AMLODIPINE BESYLATE 5 MG PO TABS
5.0000 mg | ORAL_TABLET | Freq: Every day | ORAL | Status: DC
  Administered 2022-10-13 – 2022-10-14 (×2): 5 mg via ORAL
  Filled 2022-10-13 (×2): qty 1

## 2022-10-13 MED ORDER — CARVEDILOL 6.25 MG PO TABS
6.2500 mg | ORAL_TABLET | Freq: Two times a day (BID) | ORAL | Status: DC
  Administered 2022-10-14: 6.25 mg via ORAL
  Filled 2022-10-13: qty 1

## 2022-10-13 MED ORDER — ROSUVASTATIN CALCIUM 20 MG PO TABS
20.0000 mg | ORAL_TABLET | Freq: Every day | ORAL | Status: DC
  Administered 2022-10-13: 20 mg via ORAL
  Filled 2022-10-13: qty 1

## 2022-10-13 MED ORDER — PROPOFOL 500 MG/50ML IV EMUL
INTRAVENOUS | Status: DC | PRN
  Administered 2022-10-13: 50 ug/kg/min via INTRAVENOUS

## 2022-10-13 MED ORDER — ACETAMINOPHEN 325 MG PO TABS
650.0000 mg | ORAL_TABLET | Freq: Four times a day (QID) | ORAL | Status: DC | PRN
  Administered 2022-10-13: 650 mg via ORAL
  Filled 2022-10-13: qty 2

## 2022-10-13 MED ORDER — HEPARIN (PORCINE) IN NACL 1000-0.9 UT/500ML-% IV SOLN
INTRAVENOUS | Status: DC | PRN
  Administered 2022-10-13 (×3): 500 mL

## 2022-10-13 MED ORDER — CHLORHEXIDINE GLUCONATE 0.12 % MT SOLN
15.0000 mL | Freq: Once | OROMUCOSAL | Status: AC
  Administered 2022-10-13: 15 mL via OROMUCOSAL
  Filled 2022-10-13: qty 15

## 2022-10-13 MED ORDER — POTASSIUM CHLORIDE CRYS ER 20 MEQ PO TBCR
20.0000 meq | EXTENDED_RELEASE_TABLET | Freq: Every day | ORAL | Status: DC
  Administered 2022-10-13 – 2022-10-14 (×2): 20 meq via ORAL
  Filled 2022-10-13 (×2): qty 1

## 2022-10-13 MED ORDER — PROTAMINE SULFATE 10 MG/ML IV SOLN
INTRAVENOUS | Status: DC | PRN
  Administered 2022-10-13: 10 mg via INTRAVENOUS
  Administered 2022-10-13: 100 mg via INTRAVENOUS

## 2022-10-13 MED ORDER — CHLORHEXIDINE GLUCONATE 4 % EX SOLN: 30.0000 mL | CUTANEOUS | Status: DC

## 2022-10-13 MED ORDER — IOHEXOL 350 MG/ML SOLN
INTRAVENOUS | Status: DC | PRN
  Administered 2022-10-13: 60 mL

## 2022-10-13 MED ORDER — SODIUM CHLORIDE 0.9 % IV SOLN: INTRAVENOUS | Status: DC

## 2022-10-13 MED ORDER — SODIUM CHLORIDE 0.9% FLUSH: 3.0000 mL | INTRAVENOUS | Status: DC | PRN

## 2022-10-13 MED ORDER — NITROGLYCERIN IN D5W 200-5 MCG/ML-% IV SOLN: 0.0000 ug/min | INTRAVENOUS | Status: DC

## 2022-10-13 SURGICAL SUPPLY — 33 items
BAG SNAP BAND KOVER 36X36 (MISCELLANEOUS) ×4 IMPLANT
CABLE ADAPT PACING TEMP 12FT (ADAPTER) IMPLANT
CATH 23 ULTRA DELIVERY (CATHETERS) IMPLANT
CATH DIAG 6FR PIGTAIL ANGLED (CATHETERS) IMPLANT
CATH INFINITI 5FR ANG PIGTAIL (CATHETERS) IMPLANT
CATH INFINITI 6F AL1 (CATHETERS) IMPLANT
CATH S G BIP PACING (CATHETERS) IMPLANT
CLOSURE MYNX CONTROL 6F/7F (Vascular Products) IMPLANT
CLOSURE PERCLOSE PROSTYLE (VASCULAR PRODUCTS) IMPLANT
CRIMPER (MISCELLANEOUS) IMPLANT
DEVICE INFLATION ATRION QL2530 (MISCELLANEOUS) IMPLANT
KIT HEART LEFT (KITS) ×2 IMPLANT
KIT MICROPUNCTURE NIT STIFF (SHEATH) IMPLANT
KIT SAPIAN 3 ULTRA RESILIA 23 (Valve) IMPLANT
PACK CARDIAC CATHETERIZATION (CUSTOM PROCEDURE TRAY) ×2 IMPLANT
SHEATH BRITE TIP 7FR 35CM (SHEATH) IMPLANT
SHEATH INTRODUCER SET 20-26 (SHEATH) IMPLANT
SHEATH PINNACLE 6F 10CM (SHEATH) IMPLANT
SHEATH PINNACLE 8F 10CM (SHEATH) IMPLANT
SHEATH PROBE COVER 6X72 (BAG) IMPLANT
SHIELD RADPAD SCOOP 12X17 (MISCELLANEOUS) IMPLANT
SLEEVE REPOSITIONING LENGTH 30 (MISCELLANEOUS) IMPLANT
STOPCOCK MORSE 400PSI 3WAY (MISCELLANEOUS) ×4 IMPLANT
TRANSDUCER W/STOPCOCK (MISCELLANEOUS) ×4 IMPLANT
TUBING ART PRESS 72  MALE/FEM (TUBING) ×4
TUBING ART PRESS 72 MALE/FEM (TUBING) IMPLANT
TUBING CIL FLEX 10 FLL-RA (TUBING) IMPLANT
TUBING CONTRAST HIGH PRESS 48 (TUBING) IMPLANT
WIRE AMPLATZ SS-J .035X180CM (WIRE) IMPLANT
WIRE EMERALD 3MM-J .035X150CM (WIRE) IMPLANT
WIRE EMERALD 3MM-J .035X260CM (WIRE) IMPLANT
WIRE EMERALD ST .035X260CM (WIRE) IMPLANT
WIRE SAFARI SM CURVE 275 (WIRE) IMPLANT

## 2022-10-13 NOTE — Discharge Summary (Incomplete)
[facility]   MULTIDISCIPLINARY HEART VALVE TEAM  Discharge Summary    Patient ID: Renee [name] MRN: [id number]; DOB: 1951/04/14  Admit date: 10/13/2022 Discharge date: 10/14/2022  Primary Care Provider: Lynnea Ferrier, MD  Primary Cardiologist: Dr. Juliann Pares Dr. Excell Seltzer & Dr Laneta Simmers (TAVR)   Discharge Diagnoses    Principal Problem:   S/P TAVR (transcatheter aortic valve replacement) Active Problems:   CAD in native artery   Essential hypertension   Severe aortic stenosis   Type 2 diabetes mellitus with diabetic polyneuropathy, without long-term current use of insulin (HCC)   Stage 3a chronic kidney disease (CKD) (HCC)   Overweight (BMI 25.0-29.9)   Hx of CABG   Allergies Allergies  Allergen Reactions   Tizanidine     headaches   Venlafaxine Other (See Comments)    intolerant   Codeine Nausea And Vomiting   Tramadol Nausea And Vomiting    Diagnostic Studies/Procedures    TAVR OPERATIVE NOTE     Date of Procedure:                10/13/2022   Preoperative Diagnosis:      Severe Aortic Stenosis    Postoperative Diagnosis:    Same    Procedure:        Transcatheter Aortic Valve Replacement - Percutaneous Transfemoral Approach             Edwards Sapien 3 Ultra Resilia THV (size 23 mm, serial # [id number])              Co-Surgeons:                        Evelene Croon, MD and Tonny Bollman, MD   Anesthesiologist:                  Achille Rich, MD   Echocardiographer:              Guinevere Scarlet, MD   Pre-operative Echo Findings: Severe aortic stenosis Normal/vigorous left ventricular systolic function   Post-operative Echo Findings: No paravalvular leak Normal left ventricular systolic function _____________    Echo 10/14/22: completed but pending formal read at the time of discharge   History of Present Illness     Renee [name] is a 72 y.o. 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   She has a longstanding heart murmur and she has been followed by Dr. Juliann Pares for moderate aortic stenosis.  The patient has a remote history of CABG x 1 with a LIMA to the LAD by Dr. Laneta Simmers in 1996. The patient was hospitalized in February 2024 with melena and lower GI bleeding. 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 She was placed on a PPI empirically.   The patient's most recent echocardiogram 09/15/22 demonstrated worsening of her aortic stenosis with a mean grad 38 mmHg, peak grad 63.7 mmHg, AVA 0.99 cm2, DVI 0.29, SVI 54, as well as mild-mod TR and mild AI. EF >75%. [facility] 09/12/22 showed widely patent coronary arteries with no obstructive CAD and continued patency of the LIMA to LAD graft.    The patient was evaluated by the multidisciplinary  valve team and felt to have severe, symptomatic aortic stenosis and to be a suitable candidate for TAVR, which was set up for 10/13/22.   Hospital Course     Consultants: none   Severe AS: s/p successful TAVR with a 23 mm Edwards Sapien 3 Ultra Resilia THV via the TF approach on 10/13/22. Post operative echo completed but pending formal read. Groin sites are stable. ECG with sinus and no high grade heart block. Continued on home Asprin 81 mg daily. Plan for discharge home today with close follow up in the outpatient setting.   CAD s/p CABGx1V: recent cath showed widely patent coronary arteries with no obstructive CAD and continued patency of the LIMA to LAD graft. Continue aspirin, BB and statin.   NSVT: noted to have a 10 second run of NSVT on tele. Resumed on home Coreg.  HTN: BP mildly elevated. Resume home medications  CKD stage IIIa: creat stable ~1.12.   DMT2: treated with SSI while admitted. Resume home  meds at discharge. Okay to resume Metformin after 48 hours after contrast dye exposure (5/9 AM)   Previous GI bleeding with anemia: hg has remained stable ~11.   Pulmonary nodule: pre TAVR CT showed a "small solid pulmonary nodule of the right upper lobe measuring 3 mm. No follow-up needed if patient is low-risk. Non-contrast chest CT can be considered in 12 months if patient is high-risk." Will discuss in the outpatient setting.    _____________  Discharge Vitals Blood pressure (!) 144/69, pulse 65, temperature 97.9 F (36.6 C), temperature source Oral, resp. rate 13, height 5\' 3"  (1.6 m), weight 72.6 kg, SpO2 98 %.  Filed Weights   10/13/22 0741  Weight: 72.6 kg    GEN: Well nourished, well developed, in no acute distress HEENT: normal Neck: no JVD or masses Cardiac: RRR; no murmurs, rubs, or gallops,no edema  Respiratory:  clear to auscultation bilaterally, normal work of breathing GI: soft, nontender, nondistended, + BS MS: no deformity or atrophy Skin: warm and dry, no rash  Groin sites clear without hematoma or ecchymosis  Neuro:  Alert and Oriented x 3, Strength and sensation are intact Psych: euthymic mood, full affect   Labs & Radiologic Studies    CBC Recent Labs    10/13/22 1114 10/14/22 0102  WBC  --  6.6  HGB 9.9* 9.8*  HCT 29.0* 29.2*  MCV  --  98.0  PLT  --  147*   Basic Metabolic Panel Recent Labs    16/10/96 1114 10/14/22 0102  NA 143 139  K 4.4 3.8  CL 109 108  CO2  --  23  GLUCOSE 118* 95  BUN 27* 24*  CREATININE 0.80 1.12*  CALCIUM  --  8.9  MG  --  2.0   Liver Function Tests No results for input(s): "AST", "ALT", "ALKPHOS", "BILITOT", "PROT", "ALBUMIN" in the last 72 hours. No results for input(s): "LIPASE", "AMYLASE" in the last 72 hours. Cardiac Enzymes No results for input(s): "CKTOTAL", "CKMB", "CKMBINDEX", "TROPONINI" in the last 72 hours. BNP Invalid input(s): "POCBNP" D-Dimer No results for input(s): "DDIMER" in the last 72  hours. Hemoglobin A1C No results for input(s): "HGBA1C" in the last 72 hours. Fasting Lipid Panel No results for input(s): "CHOL", "HDL", "LDLCALC", "TRIG", "CHOLHDL", "LDLDIRECT" in the last 72 hours. Thyroid Function Tests No results for input(s): "TSH", "T4TOTAL", "T3FREE", "THYROIDAB" in the last 72 hours.  Invalid input(s): "FREET3" _____________  ECHOCARDIOGRAM LIMITED  Result Date: 10/13/2022    ECHOCARDIOGRAM LIMITED  REPORT   Patient Name:   Renee [name] Date of Exam: 10/13/2022 Medical Rec #:  [id number]     Height:       63.0 in Accession #:    [id number]    Weight:       160.0 lb Date of Birth:  1951/06/03     BSA:          1.759 m Patient Age:    72 years      BP:           154/70 mmHg Patient Gender: F             HR:           79 bpm. Exam Location:  Inpatient Procedure: Limited Echo, Cardiac Doppler and Color Doppler Indications:     I35.0 Nonrheumatic aortic (valve) stenosis  History:         Patient has prior history of Echocardiogram examinations, most                  recent 09/15/2022. Prior CABG, Aortic Valve Disease,                  Signs/Symptoms:Murmur; Risk Factors:Hypertension, Diabetes and                  Dyslipidemia. Aortic stenosis.                  Aortic Valve: 23 mm Sapien prosthetic, stented (TAVR) valve is                  present in the aortic position. Procedure Date: 10/13/2022.  Sonographer:     Sheralyn Boatman RDCS Referring Phys:  [id number] Lininger Diagnosing Phys: Lennie Odor MD IMPRESSIONS  1. TTE guided TAVR. 23 mm S3. Vmax 1.4 m/s, MG 4 mmHG, EOA 2.82 cm2. No regurgitation or paravalvular leak. The aortic valve has been repaired/replaced. Aortic valve regurgitation is not visualized. There is a 23 mm Sapien prosthetic (TAVR) valve present in the aortic position. Procedure Date: 10/13/2022. Echo findings are consistent with normal structure and function of the aortic valve prosthesis. Aortic valve area, by VTI measures 2.82 cm. Aortic valve mean gradient measures 4.0  mmHg. Aortic valve Vmax measures 1.36 m/s.  2. Left ventricular ejection fraction, by estimation, is 65 to 70%. The left ventricle has hyperdynamic function. There is moderate concentric left ventricular hypertrophy.  3. Right ventricular systolic function is normal. The right ventricular size is normal.  4. The mitral valve is grossly normal. Mild mitral valve regurgitation. FINDINGS  Left Ventricle: Left ventricular ejection fraction, by estimation, is 65 to 70%. The left ventricle has hyperdynamic function. The left ventricular internal cavity size was normal in size. There is moderate concentric left ventricular hypertrophy. Right Ventricle: The right ventricular size is normal. No increase in right ventricular wall thickness. Right ventricular systolic function is normal. Pericardium: There is no evidence of pericardial effusion. Mitral Valve: The mitral valve is grossly normal. Mild mitral annular calcification. Mild mitral valve regurgitation. Tricuspid Valve: Tricuspid valve regurgitation is mild. Aortic Valve: TTE guided TAVR. 23 mm S3. Vmax 1.4 m/s, MG 4 mmHG, EOA 2.82 cm2. No regurgitation or paravalvular leak. The aortic valve has been repaired/replaced. Aortic valve regurgitation is not visualized. Aortic valve mean gradient measures 4.0 mmHg. Aortic valve peak gradient measures 7.4 mmHg. Aortic valve area, by VTI measures 2.82 cm. There is a 23 mm Sapien prosthetic, stented (TAVR) valve present in the aortic  position. Procedure Date: 10/13/2022. Echo findings are consistent with normal structure and function of the aortic valve prosthesis. Aorta: The aortic root and ascending aorta are structurally normal, with no evidence of dilitation. Additional Comments: Spectral Doppler performed. Color Doppler performed.  LEFT VENTRICLE PLAX 2D LVIDd:         4.30 cm LVIDs:         2.70 cm LV PW:         1.30 cm LV IVS:        1.40 cm LVOT diam:     2.09 cm LV SV:         99 LV SV Index:   57 LVOT Area:     3.43  cm  AORTIC VALVE AV Area (Vmax):    3.03 cm AV Area (Vmean):   3.03 cm AV Area (VTI):     2.82 cm AV Vmax:           136.00 cm/s AV Vmean:          92.750 cm/s AV VTI:            0.353 m AV Peak Grad:      7.4 mmHg AV Mean Grad:      4.0 mmHg LVOT Vmax:         120.00 cm/s LVOT Vmean:        81.900 cm/s LVOT VTI:          0.290 m LVOT/AV VTI ratio: 0.82  AORTA Ao Asc diam: 2.90 cm TRICUSPID VALVE TR Peak grad:   22.3 mmHg TR Vmax:        236.00 cm/s  SHUNTS Systemic VTI:  0.29 m Systemic Diam: 2.09 cm Lennie Odor MD Electronically signed by Lennie Odor MD Signature Date/Time: 10/13/2022/5:15:27 PM    Final    Structural Heart Procedure  Result Date: 10/13/2022 See surgical note for result.  DG Chest 2 View  Result Date: 10/13/2022 CLINICAL DATA:  Preop for aortic valve stenosis. EXAM: CHEST - 2 VIEW COMPARISON:  07/13/2020 FINDINGS: The heart size and mediastinal contours are within normal limits. CABG. Both lungs are clear. Severe generalized thoracic spine degeneration. IMPRESSION: No active cardiopulmonary disease. Electronically Signed   By: Tiburcio Pea M.D.   On: 10/13/2022 07:27   CT ANGIO CHEST AORTA W/CM & OR WO/CM  Result Date: 09/23/2022 CLINICAL DATA:  Preop evaluation for aortic valve replacement EXAM: CT ANGIOGRAPHY CHEST, ABDOMEN AND PELVIS TECHNIQUE: Non-contrast CT of the chest was initially obtained. Multidetector CT imaging through the chest, abdomen and pelvis was performed using the standard protocol during bolus administration of intravenous contrast. Multiplanar reconstructed images and MIPs were obtained and reviewed to evaluate the vascular anatomy. RADIATION DOSE REDUCTION: This exam was performed according to the departmental dose-optimization program which includes automated exposure control, adjustment of the mA and/or kV according to patient size and/or use of iterative reconstruction technique. CONTRAST:  OMNIPAQUE IOHEXOL 350 MG/ML SOLN COMPARISON:  None  Available. FINDINGS: CTA CHEST FINDINGS Cardiovascular: Normal heart size. No pericardial effusion. Normal caliber thoracic aorta with moderate atherosclerotic disease. Aortic valve thickening and calcifications. Mitral annular calcifications. Coronary artery calcifications status post CABG. Mediastinum/Nodes: Small hiatal hernia. Thyroid is unremarkable. No enlarged lymph nodes seen in the chest. Lungs/Pleura: Central airways are patent. No consolidation, pleural effusion or pneumothorax. Small solid pulmonary nodule of the right upper lobe measuring 3 mm on series 5, image 44. Musculoskeletal: Moderate multilevel degenerative disc disease with endplate sclerosis. No chest wall abnormality. No  acute or significant osseous findings. CTA ABDOMEN AND PELVIS FINDINGS Hepatobiliary: No focal liver abnormality is seen. No gallstones, gallbladder wall thickening, or biliary dilatation. Pancreas: Unremarkable. No pancreatic ductal dilatation or surrounding inflammatory changes. Spleen: Normal in size without focal abnormality. Adrenals/Urinary Tract: Bilateral adrenal glands are unremarkable. No hydronephrosis or nephrolithiasis. Small bilateral low-attenuation lesions which are too small to accurately characterize but likely simple cysts, no specific follow-up imaging is recommended. Punctate nonobstructing stone of the lower pole of the right kidney. Bladder is unremarkable. Stomach/Bowel: Stomach is within normal limits. Diverticulosis. No evidence of bowel wall thickening, distention, or inflammatory changes. Vascular/lymphatic: Normal caliber abdominal aorta with moderate atherosclerotic disease of the suprarenal abdominal aorta and severe atherosclerotic disease of the infrarenal abdominal aorta. No enlarged lymph nodes seen in the abdomen or pelvis. Reproductive: No adnexal mass. Other: Small fat containing right inguinal hernia. No abdominopelvic ascites. Musculoskeletal: No aggressive appearing osseous lesions.  VASCULAR MEASUREMENTS PERTINENT TO TAVR: AORTA: Minimal Aortic Diameter-11.9 mm Severity of Aortic Calcification-severe RIGHT PELVIS: Right Common Iliac Artery - Minimal Diameter-6.5 mm Tortuosity-mild Calcification-severe Right External Iliac Artery - Minimal Diameter-4.2 mm Tortuosity-mild Calcification-moderate Right Common Femoral Artery - Minimal Diameter-4.6 mm Tortuosity-none Calcification-moderate LEFT PELVIS: Left Common Iliac Artery - Minimal Diameter-4.9 mm Tortuosity-mild Calcification-severe Left External Iliac Artery - Minimal Diameter-6.6 mm Tortuosity-mild Calcification-none Left Common Femoral Artery - Minimal Diameter-4.5 mm Tortuosity-none Calcification-moderate Review of the MIP images confirms the above findings. IMPRESSION: 1. Vascular findings and measurements pertinent to potential TAVR procedure, as detailed above. 2. Thickening and calcification of the aortic valve, compatible with reported clinical history of aortic stenosis. 3. 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 4. Small solid pulmonary nodule of the right upper lobe measuring 3 mm. No follow-up needed if patient is low-risk. Non-contrast chest CT can be considered in 12 months if patient is high-risk. This recommendation follows the consensus statement: Guidelines for Management of Incidental Pulmonary Nodules Detected on CT Images: From the Fleischner Society 2017; Radiology 2017; 284:228-243. Electronically Signed   By: Allegra Lai M.D.   On: 09/23/2022 13:16   CT ANGIO ABDOMEN PELVIS  W &/OR WO CONTRAST  Result Date: 09/23/2022 CLINICAL DATA:  Preop evaluation for aortic valve replacement EXAM: CT ANGIOGRAPHY CHEST, ABDOMEN AND PELVIS TECHNIQUE: Non-contrast CT of the chest was initially obtained. Multidetector CT imaging through the chest, abdomen and pelvis was performed using the standard protocol during bolus administration of intravenous contrast. Multiplanar reconstructed images  and MIPs were obtained and reviewed to evaluate the vascular anatomy. RADIATION DOSE REDUCTION: This exam was performed according to the departmental dose-optimization program which includes automated exposure control, adjustment of the mA and/or kV according to patient size and/or use of iterative reconstruction technique. CONTRAST:  OMNIPAQUE IOHEXOL 350 MG/ML SOLN COMPARISON:  None Available. FINDINGS: CTA CHEST FINDINGS Cardiovascular: Normal heart size. No pericardial effusion. Normal caliber thoracic aorta with moderate atherosclerotic disease. Aortic valve thickening and calcifications. Mitral annular calcifications. Coronary artery calcifications status post CABG. Mediastinum/Nodes: Small hiatal hernia. Thyroid is unremarkable. No enlarged lymph nodes seen in the chest. Lungs/Pleura: Central airways are patent. No consolidation, pleural effusion or pneumothorax. Small solid pulmonary nodule of the right upper lobe measuring 3 mm on series 5, image 44. Musculoskeletal: Moderate multilevel degenerative disc disease with endplate sclerosis. No chest wall abnormality. No acute or significant osseous findings. CTA ABDOMEN AND PELVIS FINDINGS Hepatobiliary: No focal liver abnormality is seen. No gallstones, gallbladder wall thickening, or biliary dilatation. Pancreas: Unremarkable. No pancreatic ductal  dilatation or surrounding inflammatory changes. Spleen: Normal in size without focal abnormality. Adrenals/Urinary Tract: Bilateral adrenal glands are unremarkable. No hydronephrosis or nephrolithiasis. Small bilateral low-attenuation lesions which are too small to accurately characterize but likely simple cysts, no specific follow-up imaging is recommended. Punctate nonobstructing stone of the lower pole of the right kidney. Bladder is unremarkable. Stomach/Bowel: Stomach is within normal limits. Diverticulosis. No evidence of bowel wall thickening, distention, or inflammatory changes. Vascular/lymphatic:  Normal caliber abdominal aorta with moderate atherosclerotic disease of the suprarenal abdominal aorta and severe atherosclerotic disease of the infrarenal abdominal aorta. No enlarged lymph nodes seen in the abdomen or pelvis. Reproductive: No adnexal mass. Other: Small fat containing right inguinal hernia. No abdominopelvic ascites. Musculoskeletal: No aggressive appearing osseous lesions. VASCULAR MEASUREMENTS PERTINENT TO TAVR: AORTA: Minimal Aortic Diameter-11.9 mm Severity of Aortic Calcification-severe RIGHT PELVIS: Right Common Iliac Artery - Minimal Diameter-6.5 mm Tortuosity-mild Calcification-severe Right External Iliac Artery - Minimal Diameter-4.2 mm Tortuosity-mild Calcification-moderate Right Common Femoral Artery - Minimal Diameter-4.6 mm Tortuosity-none Calcification-moderate LEFT PELVIS: Left Common Iliac Artery - Minimal Diameter-4.9 mm Tortuosity-mild Calcification-severe Left External Iliac Artery - Minimal Diameter-6.6 mm Tortuosity-mild Calcification-none Left Common Femoral Artery - Minimal Diameter-4.5 mm Tortuosity-none Calcification-moderate Review of the MIP images confirms the above findings. IMPRESSION: 1. Vascular findings and measurements pertinent to potential TAVR procedure, as detailed above. 2. Thickening and calcification of the aortic valve, compatible with reported clinical history of aortic stenosis. 3. 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 4. Small solid pulmonary nodule of the right upper lobe measuring 3 mm. No follow-up needed if patient is low-risk. Non-contrast chest CT can be considered in 12 months if patient is high-risk. This recommendation follows the consensus statement: Guidelines for Management of Incidental Pulmonary Nodules Detected on CT Images: From the Fleischner Society 2017; Radiology 2017; 284:228-243. Electronically Signed   By: Allegra Lai M.D.   On: 09/23/2022 13:16   CT CORONARY MORPH W/CTA COR W/SCORE  W/CA W/CM &/OR WO/CM  Addendum Date: 09/23/2022   ADDENDUM REPORT: 09/23/2022 12:56 EXAM: OVER-READ INTERPRETATION  CT CHEST The following report is an over-read performed by radiologist Dr. Jacob Moores [facility] on 09/23/2022. This over-read does not include interpretation of cardiac or coronary anatomy or pathology. The cardiac TAVR interpretation by the cardiologist is attached. COMPARISON:  None. FINDINGS: Extracardiac findings will be described separately under dictation for contemporaneously obtained CTA chest, abdomen and pelvis. IMPRESSION: Please see separate dictation for contemporaneously obtained CTA chest, abdomen and pelvis dated 09/23/2022 for full description of relevant extracardiac findings. Electronically Signed   By: Allegra Lai M.D.   On: 09/23/2022 12:56   Result Date: 09/23/2022 CLINICAL DATA:  Aortic valve replacement (TAVR), pre-op eval EXAM: Cardiac TAVR CT TECHNIQUE: The patient was scanned on a Siemens Force 192 slice scanner. A 120 kV retrospective scan was triggered in the descending thoracic aorta at 111 HU's. Gantry rotation speed was 270 msecs and collimation was .9 mm. The 3D data set was reconstructed in 5% intervals of the R-R cycle. Systolic and diastolic phases were analyzed on a dedicated work station using MPR, MIP and VRT modes. The patient received OMNIPAQUE IOHEXOL 350 MG/ML SOLN of contrast. FINDINGS: Aortic Valve: Tricuspid aortic valve with severely reduced cusp excursion. Severely thickened and severely calcified aortic valve cusps. AV calcium score: 1241 Virtual Basal Annulus Measurements: Maximum/Minimum Diameter: 23.2 x 19.6 mm Perimeter: 66.9 mm Area:  346 mm2 No significant LVOT calcifications. Membranous septal length: 5 mm Based on  these measurements, the annulus would be suitable for a 23 mm Sapien 3 valve. Alternatively, Heart Team can consider 26 mm Evolut valve. Recommend Heart Team discussion for valve selection. Sinus of  Valsalva Measurements: Non-coronary:  29 mm Right - coronary:  27 mm Left - coronary:  29 mm Sinus of Valsalva Height: Left: 18 mm Right: 21 mm Aorta: Conventional 3 vessel branch pattern of aortic arch. Sinotubular Junction:  26 mm Ascending Thoracic Aorta:  32 mm Aortic Arch:  29 mm Descending Thoracic Aorta:  25 mm Coronary Artery Height above Annulus: Left main: 12 mm Right coronary: 17 mm Coronary Arteries: The study was performed without use of NTG and insufficient for plaque evaluation. Coronary artery calcium seen in 3 vessel distribution. S/p CABG, patent LIMA to LAD. Optimum Fluoroscopic Angle for Delivery: LAO 1 CAU 1 OTHER: Left atrial appendage: No thrombus. Mitral valve: Degenerative, moderate-severe mitral annular calcifications. Pulmonary artery: Mildly dilated, 29 mm. Pulmonary veins: Normal anatomy. IMPRESSION: 1. Tricuspid aortic valve with severely reduced cusp excursion. Severely thickened and severely calcified aortic valve cusps. 2. Aortic valve calcium score: 1241 3. Annulus area: 346 mm2, suitable for 23 mm Sapien 3 valve. No LVOT calcifications. Membranous septal length 5 mm. 4. Sufficient coronary artery heights from annulus. 5. Optimum fluoroscopic angle for delivery: LAO 1 CAU 1 6. Moderate-severe mitral annular calcifications. Electronically Signed: By: Weston Brass M.D. On: 09/23/2022 12:39   CARDIAC CATHETERIZATION  Result Date: 09/15/2022 1.  Widely patent coronary arteries with no obstructive CAD identified 2.  Continued patency of the LIMA to LAD graft 3.  Calcified, restricted aortic valve seen on fluoroscopy with no attempt made to cross the valve (severe AS by echo with mean transvalvular gradient 45 mmHg) Plan: Continue TAVR evaluation   ECHOCARDIOGRAM COMPLETE  Result Date: 09/15/2022    ECHOCARDIOGRAM REPORT   Patient Name:   Renee [name] Date of Exam: 09/15/2022 Medical Rec #:  [id number]     Height:       63.0 in Accession #:    [id number]    Weight:       160.0 lb  Date of Birth:  [birth year]     BSA:          1.759 m Patient Age:    71 years      BP:           117/68 mmHg Patient Gender: F             HR:           65 bpm. Exam Location:  Inpatient Procedure: 2D Echo, Color Doppler, Cardiac Doppler and Intracardiac            Opacification Agent Indications:    Aortic stenosis  History:        Patient has prior history of Echocardiogram examinations, most                 recent 03/10/2022. CAD, Prior CABG, Signs/Symptoms:Murmur; Risk                 Factors:Hypertension and Diabetes. CKD.  Sonographer:    Milda Smart Referring Phys: Tonny Bollman  Sonographer Comments: Image acquisition challenging due to patient body habitus and Image acquisition challenging due to respiratory motion. IMPRESSIONS  1. Mildly elevated intracavitary gradient (1.6 m/s) due to vigorous LV function. Calcified aortic valve with severe AS (mean gradient 45 mmHg, peak velocity 4.3 m/s, AVA 0.9 cm2).  2. Left ventricular ejection fraction, by estimation,  is >75%. The left ventricle has hyperdynamic function. The left ventricle has no regional wall motion abnormalities. There is mild concentric left ventricular hypertrophy. Left ventricular diastolic parameters are consistent with Grade I diastolic dysfunction (impaired relaxation). Elevated left atrial pressure.  3. Right ventricular systolic function is normal. The right ventricular size is normal.  4. The mitral valve is normal in structure. Trivial mitral valve regurgitation. No evidence of mitral stenosis. Moderate mitral annular calcification.  5. Tricuspid valve regurgitation is mild to moderate.  6. The aortic valve is calcified. Aortic valve regurgitation is mild. Severe aortic valve stenosis.  7. The inferior vena cava is normal in size with greater than 50% respiratory variability, suggesting right atrial pressure of 3 mmHg. FINDINGS  Left Ventricle: Left ventricular ejection fraction, by estimation, is >75%. The left ventricle has  hyperdynamic function. The left ventricle has no regional wall motion abnormalities. Definity contrast agent was given IV to delineate the left ventricular endocardial borders. The left ventricular internal cavity size was normal in size. There is mild concentric left ventricular hypertrophy. Left ventricular diastolic parameters are consistent with Grade I diastolic dysfunction (impaired relaxation). Elevated left atrial pressure. Right Ventricle: The right ventricular size is normal. Right ventricular systolic function is normal. Left Atrium: Left atrial size was normal in size. Right Atrium: Right atrial size was normal in size. Pericardium: There is no evidence of pericardial effusion. Mitral Valve: The mitral valve is normal in structure. Moderate mitral annular calcification. Trivial mitral valve regurgitation. No evidence of mitral valve stenosis. MV peak gradient, 6.0 mmHg. The mean mitral valve gradient is 3.0 mmHg. Tricuspid Valve: The tricuspid valve is normal in structure. Tricuspid valve regurgitation is mild to moderate. No evidence of tricuspid stenosis. Aortic Valve: The aortic valve is calcified. Aortic valve regurgitation is mild. Aortic regurgitation PHT measures 762 msec. Severe aortic stenosis is present. Aortic valve mean gradient measures 38.0 mmHg. Aortic valve peak gradient measures 63.7 mmHg. Aortic valve area, by VTI measures 0.99 cm. Pulmonic Valve: The pulmonic valve was normal in structure. Pulmonic valve regurgitation is trivial. No evidence of pulmonic stenosis. Aorta: The aortic root is normal in size and structure. Venous: The inferior vena cava is normal in size with greater than 50% respiratory variability, suggesting right atrial pressure of 3 mmHg. IAS/Shunts: No atrial level shunt detected by color flow Doppler. Additional Comments: Mildly elevated intracavitary gradient (1.6 m/s) due to vigorous LV function. Calcified aortic valve with severe AS (mean gradient 45 mmHg, peak  velocity 4.3 m/s, AVA 0.9 cm2).  LEFT VENTRICLE PLAX 2D LVIDd:         4.55 cm   Diastology LVIDs:         3.50 cm   LV e' medial:    4.87 cm/s LV PW:         1.30 cm   LV E/e' medial:  17.0 LV IVS:        1.20 cm   LV e' lateral:   6.73 cm/s LVOT diam:     2.10 cm   LV E/e' lateral: 12.3 LV SV:         95 LV SV Index:   54 LVOT Area:     3.46 cm  RIGHT VENTRICLE            IVC RV S prime:     9.86 cm/s  IVC diam: 1.40 cm TAPSE (M-mode): 1.7 cm LEFT ATRIUM             Index  RIGHT ATRIUM           Index LA diam:        3.90 cm 2.22 cm/m   RA Area:     16.10 cm LA Vol (A2C):   48.6 ml 27.63 ml/m  RA Volume:   38.00 ml  21.61 ml/m LA Vol (A4C):   45.1 ml 25.62 ml/m LA Biplane Vol: 47.4 ml 26.95 ml/m  AORTIC VALVE AV Area (Vmax):    0.95 cm AV Area (Vmean):   0.92 cm AV Area (VTI):     0.99 cm AV Vmax:           399.00 cm/s AV Vmean:          295.000 cm/s AV VTI:            0.956 m AV Peak Grad:      63.7 mmHg AV Mean Grad:      38.0 mmHg LVOT Vmax:         110.00 cm/s LVOT Vmean:        78.100 cm/s LVOT VTI:          0.273 m LVOT/AV VTI ratio: 0.29 AI PHT:            762 msec  AORTA Ao Root diam: 2.80 cm Ao Asc diam:  3.30 cm MITRAL VALVE               TRICUSPID VALVE MV Area (PHT): 2.78 cm    TR Peak grad:   24.6 mmHg MV Area VTI:   2.44 cm    TR Mean grad:   17.0 mmHg MV Peak grad:  6.0 mmHg    TR Vmax:        248.00 cm/s MV Mean grad:  3.0 mmHg    TR Vmean:       200.0 cm/s MV Vmax:       1.22 m/s MV Vmean:      81.6 cm/s   SHUNTS MV Decel Time: 273 msec    Systemic VTI:  0.27 m MR Peak grad: 17.6 mmHg    Systemic Diam: 2.10 cm MR Vmax:      210.00 cm/s MV E velocity: 82.80 cm/s MV A velocity: 96.90 cm/s MV E/A ratio:  0.85 Olga Millers MD Electronically signed by Olga Millers MD Signature Date/Time: 09/15/2022/12:27:21 PM    Final    Disposition   Pt is being discharged home today in good condition.  Follow-up Plans & Appointments     Follow-up Information     Janetta Hora,  PA-C. Go on 10/23/2022.   Specialties: Cardiology, Radiology Why: @ 9:50am. please arrive at least 10 min early. Contact information: [address] [place] [address] [phone]                Discharge Instructions     Amb Referral to Cardiac Rehabilitation   Complete by: As directed    Diagnosis: Valve Replacement   Valve: Aortic   After initial evaluation and assessments completed: Virtual Based Care may be provided alone or in conjunction with Phase 2 Cardiac Rehab based on patient barriers.: Yes   Intensive Cardiac Rehabilitation (ICR) [facility] location only OR Traditional Cardiac Rehabilitation (TCR) *If criteria for ICR are not met will enroll in TCR [facility] only): Yes       Discharge Medications   Allergies as of 10/14/2022       Reactions   Tizanidine    headaches   Venlafaxine Other (  See Comments)   intolerant   Codeine Nausea And Vomiting   Tramadol Nausea And Vomiting        Medication List     TAKE these medications    Accu-Chek Guide Me w/Device Kit USE AS DIRECTED TO TEST SUGAR DAILY   Accu-Chek Guide test strip Generic drug: glucose blood USE TO TEST SUGAR DAILY   Accu-Chek Softclix Lancets lancets daily.   Alpha-Lipoic Acid 600 MG Caps Take 600 mg by mouth daily.   amLODipine 5 MG tablet Commonly known as: NORVASC Take 5 mg by mouth daily.   aspirin EC 81 MG tablet Take 81 mg by mouth daily. Swallow whole.   Azelastine HCl 137 MCG/SPRAY Soln Place 1 spray into the nose 2 (two) times daily as needed (allergies).   carvedilol 6.25 MG tablet Commonly known as: COREG Take 6.25 mg by mouth 2 (two) times a day.   CINNAMON PO Take 1,000 mg by mouth daily.   [employer] Senna 8.6 MG tablet Generic drug: senna TAKE 1 TABLET (8.6 MG TOTAL) BY MOUTH DAILY AS NEEDED FOR MILD CONSTIPATION.   EPINEPHrine 0.3 mg/0.3 mL Soaj injection Commonly known as: EPI-PEN Inject 0.3 mg into the muscle as needed for anaphylaxis.   Fish Oil  1000 MG Caps Take 1,000 mg by mouth 3 (three) times daily.   furosemide 20 MG tablet Commonly known as: LASIX Take 20 mg by mouth once a week. In the morning   gabapentin 800 MG tablet Commonly known as: NEURONTIN Take 800-1,600 mg by mouth See admin instructions. Take 1 tablet (800 mg) by mouth in the morning, 1 tablet (800 mg) by mouth at noon, & take 2 tablets (1600 mg) by mouth at bedtime.   loratadine 10 MG tablet Commonly known as: CLARITIN Take 10 mg by mouth daily as needed for allergies.   metFORMIN 500 MG 24 hr tablet Commonly known as: GLUCOPHAGE-XR Take 500 mg by mouth daily with breakfast. Notes to patient: Okay to resume Metformin after 48 hours after contrast dye exposure (5/9 AM)    nitroGLYCERIN 0.4 MG SL tablet Commonly known as: NITROSTAT Place 0.4 mg under the tongue every 5 (five) minutes as needed for chest pain.   potassium chloride SA 20 MEQ tablet Commonly known as: KLOR-CON M Take 20 mEq by mouth daily.   rosuvastatin 20 MG tablet Commonly known as: CRESTOR Take 20 mg by mouth at bedtime.   Slow Fe 142 (45 Fe) MG Tbcr tablet Generic drug: ferrous sulfate ER Take 45 mg by mouth every Monday, Wednesday, and Friday.   Systane Balance 0.6 % Soln Generic drug: Propylene Glycol Place 1 drop into both eyes daily.   tiZANidine 2 MG tablet Commonly known as: ZANAFLEX Take 1 tablet (2 mg total) by mouth every 8 (eight) hours as needed for muscle spasms.   triamcinolone cream 0.5 % Commonly known as: KENALOG Apply 1 Application topically daily as needed (irritation).   Vitamin D3 50 MCG (2000 UT) Tabs Take 2,000 Units by mouth daily.          Outstanding Labs/Studies   None   Duration of Discharge Encounter   Greater than 30 minutes including physician time.  Byrd Hesselbach, PA-C 10/14/2022, 9:16 AM [phone]  Patient seen, examined. Available data reviewed. Agree with findings, assessment, and plan as outlined by Carlean Jews, PA-C.  The patient is independently interviewed and examined.  She is alert, oriented, in no distress.  Lungs are clear, JVP is normal, carotid upstrokes normal  without bruits, heart is regular rate and rhythm with no murmur or gallop, lungs are clear, abdomen soft nontender, bilateral groin sites are clear with no hematoma or ecchymosis, there is no lower extremity edema.  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  The patient is medically stable for hospital discharge today.  Hospital follow-up is arranged as above.  I discussed post-TAVR restrictions with her.  All of her questions were answered.  I agree with the medication program outlined above.  Tonny Bollman, M.D. 10/14/2022 4:24 PM

## 2022-10-13 NOTE — Progress Notes (Signed)
  [facility]   MULTIDISCIPLINARY HEART VALVE TEAM  Patient doing well s/p TAVR. She is hemodynamically stable. Groin sites stable. ECG with sinus and no high grade block. Plan to transfer to 4E once bed available. Plan for early ambulation after bedrest completed and hopeful discharge over the next 24-48 hours.   Cline Crock PA-C  [facility]  Pager [phone]

## 2022-10-13 NOTE — Progress Notes (Signed)
  Echocardiogram 2D Echocardiogram has been performed.  Renee [name] 10/13/2022, 11:05 AM

## 2022-10-13 NOTE — Op Note (Signed)
[facility]   MULTIDISCIPLINARY HEART VALVE TEAM   TAVR OPERATIVE NOTE   Date of Procedure:  10/13/2022  Preoperative Diagnosis: Severe Aortic Stenosis   Postoperative Diagnosis: Same   Procedure:   Transcatheter Aortic Valve Replacement - Percutaneous Right Transfemoral Approach  Edwards Sapien 3 Ultra Resilia THV (size 23 mm, model # 9755RSL serial # [id number])   Co-Surgeons:  Alleen Borne, MD and Tonny Bollman, MD   Anesthesiologist:  A. Hodierne, MD  Echocardiographer:  W. O'Neal, MD  Pre-operative Echo Findings: Severe aortic stenosis Normal left ventricular systolic function  Post-operative Echo Findings: No paravalvular leak Normal left ventricular systolic function   BRIEF CLINICAL NOTE AND INDICATIONS FOR SURGERY  This 72 year old woman has stage D, severe, symptomatic aortic stenosis with NYHA class II symptoms of exertional fatigue and shortness of breath consistent with chronic diastolic congestive heart failure.  I personally reviewed her 2D echocardiogram, cardiac catheterization, and CTA studies.  Her recent echocardiogram shows a severely calcified aortic valve with restricted leaflet mobility.  The mean gradient was 45 mmHg with a peak gradient of 64 mmHg and a valve area of 0.9 cm.  There was mild aortic insufficiency.  Left ventricular ejection fraction was greater than 75%.  Cardiac catheterization showed continued patency of the LIMA to the LAD.  There was no other significant coronary disease.  The aortic valve was not crossed.  I agree that aortic valve replacement is indicated in this patient for relief of her symptoms and to prevent progressive left ventricular dysfunction.  Given her age and prior coronary bypass surgery I think that transcatheter aortic valve replacement be the best treatment for her.  Her gated cardiac CTA shows anatomy suitable for TAVR using a 23 mm SAPIEN 3 valve.  Her abdominal and pelvic CTA shows adequate pelvic  vascular anatomy to allow transfemoral insertion.   The patient and her sister were counseled at length regarding treatment alternatives for management of severe symptomatic aortic stenosis. The risks and benefits of surgical intervention has been discussed in detail. Long-term prognosis with medical therapy was discussed. Alternative approaches such as conventional surgical aortic valve replacement, transcatheter aortic valve replacement, and palliative medical therapy were compared and contrasted at length. This discussion was placed in the context of the patient's own specific clinical presentation and past medical history. All of their questions have been addressed.    Following the decision to proceed with transcatheter aortic valve replacement, a discussion was held regarding what types of management strategies would be attempted intraoperatively in the event of life-threatening complications, including whether or not the patient would be considered a candidate for the use of cardiopulmonary bypass and/or conversion to open sternotomy for attempted surgical intervention.  I do not think she is a candidate for emergent sternotomy to manage any intraoperative complications with prior coronary bypass surgery.  The patient is aware of the fact that transient use of cardiopulmonary bypass may be necessary. The patient has been advised of a variety of complications that might develop including but not limited to risks of death, stroke, paravalvular leak, aortic dissection or other major vascular complications, aortic annulus rupture, device embolization, cardiac rupture or perforation, mitral regurgitation, acute myocardial infarction, arrhythmia, heart block or bradycardia requiring permanent pacemaker placement, congestive heart failure, respiratory failure, renal failure, pneumonia, infection, other late complications related to structural valve deterioration or migration, or other complications that might  ultimately cause a temporary or permanent loss of functional independence or other long term morbidity. The  patient provides full informed consent for the procedure as described and all questions were answered.     DETAILS OF THE OPERATIVE PROCEDURE  PREPARATION:    The patient was brought to the operating room on the above mentioned date and appropriate monitoring was established by the anesthesia team. The patient was placed in the supine position on the operating table.  Intravenous antibiotics were administered. The patient was monitored closely throughout the procedure under conscious sedation.    Baseline transthoracic echocardiogram was performed. The patient's abdomen and both groins were prepped and draped in a sterile manner. A time out procedure was performed.   PERIPHERAL ACCESS:    Using the modified Seldinger technique, femoral arterial and venous access was obtained with placement of 6 Fr sheaths on the left side.  A pigtail diagnostic catheter was passed through the left arterial sheath under fluoroscopic guidance into the aortic root.  A temporary transvenous pacemaker catheter was passed through the left femoral venous sheath under fluoroscopic guidance into the right ventricle.  The pacemaker was tested to ensure stable lead placement and pacemaker capture. Aortic root angiography was performed in order to determine the optimal angiographic angle for valve deployment.   TRANSFEMORAL ACCESS:   Percutaneous transfemoral access and sheath placement was performed using ultrasound guidance.  The right common femoral artery was cannulated using a micropuncture needle and appropriate location was verified using hand injection angiogram.  A pair of Abbott Perclose percutaneous closure devices were placed and a 6 French sheath replaced into the femoral artery.  The patient was heparinized systemically and ACT verified > 250 seconds.    A 14 Fr transfemoral E-sheath was introduced into  the right common femoral artery after progressively dilating over an Amplatz superstiff wire. An AL-1 catheter was used to direct a straight-tip exchange length wire across the native aortic valve into the left ventricle. This was exchanged out for a pigtail catheter and position was confirmed in the LV apex. Simultaneous LV and Ao pressures were recorded.  The pigtail catheter was exchanged for a Safari wire in the LV apex.   BALLOON AORTIC VALVULOPLASTY:   Not performed   TRANSCATHETER HEART VALVE DEPLOYMENT:   An Edwards Sapien 3 Ultra transcatheter heart valve (size 23 mm) was prepared and crimped per manufacturer's guidelines, and the proper orientation of the valve is confirmed on the [employer] delivery system. The valve was advanced through the introducer sheath using normal technique until in an appropriate position in the abdominal aorta beyond the sheath tip. The balloon was then retracted and using the fine-tuning wheel was centered on the valve. The valve was then advanced across the aortic arch using appropriate flexion of the catheter. The valve was carefully positioned across the aortic valve annulus. The Commander catheter was retracted using normal technique. Once final position of the valve has been confirmed by angiographic assessment, the valve is deployed during rapid ventricular pacing to maintain systolic blood pressure < 50 mmHg and pulse pressure < 10 mmHg. The balloon inflation is held for >3 seconds after reaching full deployment volume. Once the balloon has fully deflated the balloon is retracted into the ascending aorta and valve function is assessed using echocardiography. There is felt to be no paravalvular leak and no central aortic insufficiency.  The patient's hemodynamic recovery following valve deployment is good.  The deployment balloon and guidewire are both removed.    PROCEDURE COMPLETION:   The sheath was removed and femoral artery closure performed.   Protamine  was administered once femoral arterial repair was complete. The temporary pacemaker, pigtail catheter and femoral sheaths were removed with manual pressure used for venous hemostasis.  A Mynx femoral closure device was utilized following removal of the diagnostic sheath in the left femoral artery.  The patient tolerated the procedure well and is transported to the cath lab recovery area in stable condition. There were no immediate intraoperative complications. All sponge instrument and needle counts are verified correct at completion of the operation.   No blood products were administered during the operation.  The patient received a total of 60 mL of intravenous contrast during the procedure.   Alleen Borne, MD 10/13/2022

## 2022-10-13 NOTE — Anesthesia Preprocedure Evaluation (Signed)
Anesthesia Evaluation  Patient identified by MRN, date of birth, ID band Patient awake    Reviewed: Allergy & Precautions, H&P , NPO status , Patient's Chart, lab work & pertinent test results  Airway Mallampati: II   Neck ROM: full    Dental   Pulmonary Current Smoker and Patient abstained from smoking.   breath sounds clear to auscultation       Cardiovascular hypertension, + CAD and + CABG  + Valvular Problems/Murmurs AS  Rhythm:regular Rate:Normal  Severe AS with mean PG 38 mmHg.   Neuro/Psych  Neuromuscular disease    GI/Hepatic ,GERD  ,,  Endo/Other  diabetes, Type 2    Renal/GU Renal InsufficiencyRenal disease     Musculoskeletal  (+) Arthritis ,    Abdominal   Peds  Hematology   Anesthesia Other Findings   Reproductive/Obstetrics                             Anesthesia Physical Anesthesia Plan  ASA: 3  Anesthesia Plan: MAC   Post-op Pain Management:    Induction: Intravenous  PONV Risk Score and Plan: 1 and Propofol infusion and Treatment may vary due to age or medical condition  Airway Management Planned: Simple Face Mask  Additional Equipment:   Intra-op Plan:   Post-operative Plan:   Informed Consent: I have reviewed the patients History and Physical, chart, labs and discussed the procedure including the risks, benefits and alternatives for the proposed anesthesia with the patient or authorized representative who has indicated his/her understanding and acceptance.     Dental advisory given  Plan Discussed with: CRNA, Anesthesiologist and Surgeon  Anesthesia Plan Comments:        Anesthesia Quick Evaluation

## 2022-10-13 NOTE — Anesthesia Procedure Notes (Signed)
Procedure Name: MAC Date/Time: 10/13/2022 9:40 AM  Performed by: Macie Burows, CRNAPre-anesthesia Checklist: Patient identified, Emergency Drugs available, Suction available, Patient being monitored and Timeout performed Patient Re-evaluated:Patient Re-evaluated prior to induction Oxygen Delivery Method: Simple face mask Induction Type: IV induction Placement Confirmation: positive ETCO2 and breath sounds checked- equal and bilateral Dental Injury: Teeth and Oropharynx as per pre-operative assessment

## 2022-10-13 NOTE — Transfer of Care (Signed)
Immediate Anesthesia Transfer of Care Note  Patient: Renee [name]  Procedure(s) Performed: Transcatheter Aortic Valve Replacement, Transfemoral (Right) INTRAOPERATIVE TRANSTHORACIC ECHOCARDIOGRAM  Patient Location: Cath Lab  Anesthesia Type:MAC  Level of Consciousness: awake, alert , and oriented  Airway & Oxygen Therapy: Patient Spontanous Breathing and Patient connected to nasal cannula oxygen  Post-op Assessment: Report given to RN and Post -op Vital signs reviewed and stable  Post vital signs: Reviewed and stable  Last Vitals:  Vitals Value Taken Time  BP    Temp    Pulse 67 10/13/22 1114  Resp 13 10/13/22 1114  SpO2 93 % 10/13/22 1114  Vitals shown include unvalidated device data.  Last Pain:  Vitals:   10/13/22 0741  TempSrc: Oral  PainSc: 0-No pain         Complications: There were no known notable events for this encounter.

## 2022-10-13 NOTE — Op Note (Signed)
[facility]   MULTIDISCIPLINARY HEART VALVE TEAM   TAVR OPERATIVE NOTE   Date of Procedure:  10/13/2022  Preoperative Diagnosis: Severe Aortic Stenosis   Postoperative Diagnosis: Same   Procedure:   Transcatheter Aortic Valve Replacement - Percutaneous Transfemoral Approach  Edwards Sapien 3 Ultra Resilia THV (size 23 mm, serial # [id number])   Co-Surgeons:  Evelene Croon, MD and Tonny Bollman, MD  Anesthesiologist:  Achille Rich, MD  Echocardiographer:  Guinevere Scarlet, MD  Pre-operative Echo Findings: Severe aortic stenosis Normal/vigorous left ventricular systolic function  Post-operative Echo Findings: No paravalvular leak Normal left ventricular systolic function  BRIEF CLINICAL NOTE AND INDICATIONS FOR SURGERY  72 year old Renee [name] presenting for TAVR.  The patient has developed severe, symptomatic aortic stenosis.  She underwent remote single-vessel CABG in 1996.  She has a history of gastric AVMs with GI bleeding, requiring packed red blood cell transfusion in February 2024.  She has recovered from this with stabilization of her anemia and no further bleeding problems.  The patient has developed progressive aortic stenosis with New York Heart Association functional class II symptoms of exertional dyspnea, calculated aortic valve area 0.7 cm.  Preoperative testing indicates appropriate anatomy for transfemoral access and a 23 mm Edwards SAPIEN 3 ultra valve.  During the course of the patient's preoperative work up they have been evaluated comprehensively by a multidisciplinary team of specialists coordinated through the Multidisciplinary Heart Valve Clinic in the [facility].  They have been demonstrated to suffer from symptomatic severe aortic stenosis as noted above. The patient has been counseled extensively as to the relative risks and benefits of all options for the treatment of severe aortic stenosis including long term medical therapy,  conventional surgery for aortic valve replacement, and transcatheter aortic valve replacement.  The patient has been independently evaluated in formal cardiac surgical consultation by Dr Laneta Simmers, who deemed the patient appropriate for TAVR. Based upon review of all of the patient's preoperative diagnostic tests they are felt to be candidate for transcatheter aortic valve replacement using the transfemoral approach as an alternative to conventional surgery.    Following the decision to proceed with transcatheter aortic valve replacement, a discussion has been held regarding what types of management strategies would be attempted intraoperatively in the event of life-threatening complications, including whether or not the patient would be considered a candidate for the use of cardiopulmonary bypass and/or conversion to open sternotomy for attempted surgical intervention.  The patient has been advised of a variety of complications that might develop peculiar to this approach including but not limited to risks of death, stroke, paravalvular leak, aortic dissection or other major vascular complications, aortic annulus rupture, device embolization, cardiac rupture or perforation, acute myocardial infarction, arrhythmia, heart block or bradycardia requiring permanent pacemaker placement, congestive heart failure, respiratory failure, renal failure, pneumonia, infection, other late complications related to structural valve deterioration or migration, or other complications that might ultimately cause a temporary or permanent loss of functional independence or other long term morbidity.  The patient provides full informed consent for the procedure as described and all questions were answered preoperatively.  DETAILS OF THE OPERATIVE PROCEDURE  PREPARATION:   The patient is brought to the operating room on the above mentioned date and central monitoring was established by the anesthesia team including placement of a  radial arterial line. The patient is placed in the supine position on the operating table.  Intravenous antibiotics are administered. The patient is monitored closely throughout the  procedure under conscious sedation.  Baseline transthoracic echocardiogram is performed. The patient's chest, abdomen, both groins, and both lower extremities are prepared and draped in a sterile manner. A time out procedure is performed.   PERIPHERAL ACCESS:   Using ultrasound guidance, femoral arterial and venous access is obtained with placement of 6 Fr sheaths on the left side.  [place] images are digitally captured and stored in the patient's chart. A pigtail diagnostic catheter was passed through the femoral arterial sheath under fluoroscopic guidance into the aortic root.  A temporary transvenous pacemaker catheter was passed through the femoral venous sheath under fluoroscopic guidance into the right ventricle.  The pacemaker was tested to ensure stable lead placement and pacemaker capture. Aortic root angiography was performed in order to determine the optimal angiographic angle for valve deployment.  TRANSFEMORAL ACCESS:  A micropuncture technique is used to access the right femoral artery under fluoroscopic and ultrasound guidance.  2 Perclose devices are deployed at 10' and 2' positions to 'PreClose' the femoral artery. An 8 French sheath is placed and then an Amplatz Superstiff wire is advanced through the sheath. This is changed out for a 14 French transfemoral E-Sheath after progressively dilating over the Superstiff wire.  An AL-1 catheter was used to direct a straight-tip exchange length wire across the native aortic valve into the left ventricle. This was exchanged out for a pigtail catheter and position was confirmed in the LV apex. Simultaneous LV and Ao pressures were recorded.  The pigtail catheter was exchanged for a Safari wire in the LV apex.    BALLOON AORTIC VALVULOPLASTY:  Not performed  TRANSCATHETER  HEART VALVE DEPLOYMENT:  An Edwards Sapien 3 Ultra Resilia transcatheter heart valve (size 23 mm) was prepared and crimped per manufacturer's guidelines, and the proper orientation of the valve is confirmed on the [employer] delivery system. The valve was advanced through the introducer sheath using normal technique until in an appropriate position in the abdominal aorta beyond the sheath tip. The balloon was then retracted and using the fine-tuning wheel was centered on the valve. The valve was then advanced across the aortic arch using appropriate flexion of the catheter. The valve was carefully positioned across the aortic valve annulus. The Commander catheter was retracted using normal technique. Once final position of the valve has been confirmed by angiographic assessment, the valve is deployed while temporarily holding ventilation and during rapid ventricular pacing to maintain systolic blood pressure < 50 mmHg and pulse pressure < 10 mmHg. The balloon inflation is held for >3 seconds after reaching full deployment volume. Once the balloon has fully deflated the balloon is retracted into the ascending aorta and valve function is assessed using echocardiography. The patient's hemodynamic recovery following valve deployment is good.  The deployment balloon and guidewire are both removed. Echo demostrated acceptable post-procedural gradients, stable mitral valve function, and no aortic insufficiency.    PROCEDURE COMPLETION:  The sheath was removed and femoral artery closure is performed using the 2 previously deployed Perclose devices.  Protamine is administered once femoral arterial repair was complete. The site is clear with no evidence of bleeding or hematoma after the sutures are tightened. The temporary pacemaker and pigtail catheters are removed. Mynx closure is used for contralateral femoral arterial hemostasis for the 6 Fr sheath.  The patient tolerated the procedure well and is  transported to the recovery area in stable condition. There were no immediate intraoperative complications. All sponge instrument and needle counts are verified correct at completion  of the operation.   The patient received a total of 60 mL of intravenous contrast during the procedure.  EBL: minimal  LVEDP: 18 mmHg   Tonny Bollman, MD 10/13/2022 9:37 AM

## 2022-10-13 NOTE — Discharge Instructions (Signed)

## 2022-10-13 NOTE — Interval H&P Note (Signed)
History and Physical Interval Note:  10/13/2022 9:23 AM  Renee [name]  has presented today for surgery, with the diagnosis of Severe Aortic Stenosis.  The various methods of treatment have been discussed with the patient and family. After consideration of risks, benefits and other options for treatment, the patient has consented to  Procedure(s): Transcatheter Aortic Valve Replacement, Transfemoral (Right) INTRAOPERATIVE TRANSTHORACIC ECHOCARDIOGRAM (N/A) as a surgical intervention.  The patient's history has been reviewed, patient examined, no change in status, stable for surgery.  I have reviewed the patient's chart and labs.  Questions were answered to the patient's satisfaction.     Alleen Borne

## 2022-10-14 ENCOUNTER — Inpatient Hospital Stay (HOSPITAL_COMMUNITY): Payer: No Typology Code available for payment source

## 2022-10-14 DIAGNOSIS — Z952 Presence of prosthetic heart valve: Secondary | ICD-10-CM

## 2022-10-14 LAB — CBC
HCT: 29.2 % — ABNORMAL LOW (ref 36.0–46.0)
Hemoglobin: 9.8 g/dL — ABNORMAL LOW (ref 12.0–15.0)
MCH: 32.9 pg (ref 26.0–34.0)
MCHC: 33.6 g/dL (ref 30.0–36.0)
MCV: 98 fL (ref 80.0–100.0)
Platelets: 147 10*3/uL — ABNORMAL LOW (ref 150–400)
RBC: 2.98 MIL/uL — ABNORMAL LOW (ref 3.87–5.11)
RDW: 17.2 % — ABNORMAL HIGH (ref 11.5–15.5)
WBC: 6.6 10*3/uL (ref 4.0–10.5)
nRBC: 0 % (ref 0.0–0.2)

## 2022-10-14 LAB — ECHOCARDIOGRAM COMPLETE: Weight: 2560.02 oz

## 2022-10-14 LAB — BASIC METABOLIC PANEL
Anion gap: 8 (ref 5–15)
BUN: 24 mg/dL — ABNORMAL HIGH (ref 8–23)
CO2: 23 mmol/L (ref 22–32)
Calcium: 8.9 mg/dL (ref 8.9–10.3)
Chloride: 108 mmol/L (ref 98–111)
Creatinine, Ser: 1.12 mg/dL — ABNORMAL HIGH (ref 0.44–1.00)
GFR, Estimated: 52 mL/min — ABNORMAL LOW (ref >60)
Glucose, Bld: 95 mg/dL (ref 70–99)
Potassium: 3.8 mmol/L (ref 3.5–5.1)
Sodium: 139 mmol/L (ref 135–145)

## 2022-10-14 LAB — GLUCOSE, CAPILLARY
Glucose-Capillary: 92 mg/dL (ref 70–99)
Glucose-Capillary: 110 mg/dL — ABNORMAL HIGH (ref 70–99)
Glucose-Capillary: 97 mg/dL (ref 70–99)

## 2022-10-14 LAB — MAGNESIUM: Magnesium: 2 mg/dL (ref 1.7–2.4)

## 2022-10-14 NOTE — Anesthesia Postprocedure Evaluation (Signed)
Anesthesia Post Note  Patient: Renee [name]  Procedure(s) Performed: Transcatheter Aortic Valve Replacement, Transfemoral (Right) INTRAOPERATIVE TRANSTHORACIC ECHOCARDIOGRAM     Patient location during evaluation: Cath Lab Anesthesia Type: MAC Level of consciousness: awake and alert Pain management: pain level controlled Vital Signs Assessment: post-procedure vital signs reviewed and stable Respiratory status: spontaneous breathing, nonlabored ventilation, respiratory function stable and patient connected to nasal cannula oxygen Cardiovascular status: stable and blood pressure returned to baseline Postop Assessment: no apparent nausea or vomiting Anesthetic complications: no   There were no known notable events for this encounter.  Last Vitals:  Vitals:   10/14/22 0000 10/14/22 0728  BP:  (!) 144/69  Pulse:  65  Resp: 13 13  Temp:  36.6 C  SpO2:  98%    Last Pain:  Vitals:   10/14/22 0728  TempSrc: Oral  PainSc: 0-No pain                 Godric Lavell S

## 2022-10-14 NOTE — Progress Notes (Signed)
  Echocardiogram 2D Echocardiogram has been performed.  Renee [name] 10/14/2022, 10:23 AM

## 2022-10-14 NOTE — Progress Notes (Signed)
CARDIAC REHAB PHASE I   PRE:  Rate/Rhythm: 78 SR  BP:  Sitting: 144/69      SaO2: 96 RA  MODE:  Ambulation: 150 ft   POST:  Rate/Rhythm: 82 SR      SaO2: 97 RA  Pt ambulated in hall independently, using front wheel walker. Tolerated well with no dizziness, SOB or pain. Returned to bed with call bell and bedside table with each. Post TAVR education including site care, restrictions, risk factors, exercise guidelines, smoking cessation, heart healthy diabetic diet and CRP2 reviewed. All questions and concerns addressed. Will refer to [facility] for CRP2. Plan for home today.   [id number]    Woodroe Chen, RN BSN 10/14/2022 9:12 AM

## 2022-10-15 ENCOUNTER — Telehealth: Payer: Self-pay | Admitting: Physician Assistant

## 2022-10-15 NOTE — Telephone Encounter (Signed)
  [facility]   MULTIDISCIPLINARY HEART VALVE TEAM   Patient contacted regarding discharge from [facility] on 10/14/22  Patient understands to follow up with a structural heart APP on 5/17 at [address].  Patient understands discharge instructions? yes Patient understands medications and regimen? yes Patient understands to bring all medications to this visit? yes  Cline Crock PA-C  [facility]

## 2022-10-19 ENCOUNTER — Other Ambulatory Visit: Payer: Self-pay | Admitting: Neurosurgery

## 2022-10-20 DIAGNOSIS — Z953 Presence of xenogenic heart valve: Secondary | ICD-10-CM | POA: Diagnosis not present

## 2022-10-20 DIAGNOSIS — Z8719 Personal history of other diseases of the digestive system: Secondary | ICD-10-CM | POA: Diagnosis not present

## 2022-10-20 DIAGNOSIS — E1142 Type 2 diabetes mellitus with diabetic polyneuropathy: Secondary | ICD-10-CM | POA: Diagnosis not present

## 2022-10-20 DIAGNOSIS — M48062 Spinal stenosis, lumbar region with neurogenic claudication: Secondary | ICD-10-CM | POA: Diagnosis not present

## 2022-10-20 DIAGNOSIS — G63 Polyneuropathy in diseases classified elsewhere: Secondary | ICD-10-CM | POA: Diagnosis not present

## 2022-10-20 DIAGNOSIS — I10 Essential (primary) hypertension: Secondary | ICD-10-CM | POA: Diagnosis not present

## 2022-10-21 LAB — ECHOCARDIOGRAM COMPLETE
AR max vel: 2.66 cm2
AV Area VTI: 2.63 cm2
AV Area mean vel: 2.69 cm2
AV Mean grad: 10 mmHg
AV Peak grad: 18 mmHg
Ao pk vel: 2.12 m/s
Area-P 1/2: 3.31 cm2
Est EF: >75
Height: 63 in
S' Lateral: 2.9 cm

## 2022-10-22 NOTE — Progress Notes (Signed)
[facility]   MULTIDISCIPLINARY HEART VALVE CLINIC                                     Cardiology Office Note:    Date:  10/23/2022   ID:  Renee [name], DOB 12-28-1950, MRN [id number]  PCP:  Lynnea Ferrier, MD  [facility] Cardiologist:  Dr. Juliann Pares /  Dr. Excell Seltzer & Dr Laneta Simmers (TAVR)  [facility] Electrophysiologist:  None   Referring MD: Lynnea Ferrier, MD   [facility] s/p TAVR  History of Present Illness:    Renee [name] is a 72 y.o. 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   She has a longstanding heart murmur and she has been followed by Dr. Juliann Pares for moderate aortic stenosis.  The patient has a remote history of CABG x 1 with a LIMA to the LAD by Dr. Laneta Simmers in 1996. The patient was hospitalized in February 2024 with melena and lower GI bleeding. 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 She was placed on a PPI empirically.   The patient's most recent echocardiogram 09/15/22 demonstrated worsening of her aortic stenosis with a mean grad 38 mmHg, peak grad 63.7 mmHg, AVA 0.99 cm2, DVI 0.29, SVI 54, as well as mild-mod TR and mild AI. EF >75%. [facility] 09/12/22 showed widely patent coronary arteries with no obstructive CAD and continued patency of the LIMA to LAD graft.    The patient was evaluated by the multidisciplinary valve team and underwent successful TAVR with a 23 mm Edwards Sapien 3 Ultra Resilia THV via the TF approach on 10/13/22. Post operative echo showed EF >75%, normally functioning TAVR with a mean gradient of 10 mmHg and no PVL as well as mild to moderate TR. She was discharged on a baby aspirin 81mg  daily.   Today the patient presents to clinic for follow  up. No CP or SOB. No LE edema, orthopnea or PND. No dizziness or syncope. No blood in stool or urine. No palpitations. Very motivated to quit smoking now.     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 Excell Seltzer & Dr. Laneta Simmers   Severe aortic stenosis     Past Surgical History:  Procedure Laterality Date   ABDOMINAL HYSTERECTOMY     BREAST EXCISIONAL BIOPSY Right 20+ yrs ago   benign   CARDIAC SURGERY     cabg   CARPAL TUNNEL RELEASE Bilateral    COLONOSCOPY N/A 02/24/2017   Procedure: COLONOSCOPY;  Surgeon: Toledo, Boykin Nearing, MD;  Location: [facility] ENDOSCOPY;  Service: Endoscopy;  Laterality: N/A;   COLONOSCOPY N/A 07/28/2022   Procedure: COLONOSCOPY;  Surgeon: Toledo, Boykin Nearing, MD;  Location: [facility] ENDOSCOPY;  Service: Gastroenterology;  Laterality: N/A;   CORONARY ARTERY BYPASS GRAFT     1996   CORONARY/GRAFT ANGIOGRAPHY N/A 09/15/2022   Procedure: CORONARY/GRAFT ANGIOGRAPHY;  Surgeon: Tonny Bollman, MD;  Location: [facility] INVASIVE CV LAB;  Service: Cardiovascular;  Laterality: N/A;   ESOPHAGOGASTRODUODENOSCOPY (EGD) WITH PROPOFOL N/A 07/27/2022   Procedure: ESOPHAGOGASTRODUODENOSCOPY (EGD) WITH PROPOFOL;  Surgeon: Toledo, Boykin Nearing, MD;  Location: [facility] ENDOSCOPY;  Service: Gastroenterology;  Laterality: N/A;   HERNIA REPAIR     umbilical   INTRAOPERATIVE TRANSTHORACIC ECHOCARDIOGRAM N/A 10/13/2022   Procedure: INTRAOPERATIVE TRANSTHORACIC ECHOCARDIOGRAM;  Surgeon: Tonny Bollman, MD;  Location: [facility] INVASIVE CV LAB;  Service: Open Heart Surgery;  Laterality: N/A;   LUMBAR LAMINECTOMY/DECOMPRESSION MICRODISCECTOMY N/A 06/25/2021   Procedure: L2-3 DECOMPRESSION;  Surgeon: Venetia Night, MD;  Location: [facility] ORS;  Service: Neurosurgery;  Laterality: N/A;   TOTAL KNEE ARTHROPLASTY Left 10/25/2018   Procedure: TOTAL KNEE ARTHROPLASTY - LEFT - DIABETIC;  Surgeon: Kennedy Bucker, MD;  Location: [facility] ORS;  Service: Orthopedics;  Laterality: Left;   TRANSCATHETER AORTIC VALVE REPLACEMENT, TRANSFEMORAL Right 10/13/2022   Procedure: Transcatheter Aortic Valve Replacement, Transfemoral;  Surgeon: Tonny Bollman, MD;  Location: [facility] INVASIVE CV LAB;  Service: Open Heart Surgery;  Laterality: Right;    Current Medications: Current Meds  Medication Sig   ACCU-CHEK GUIDE test strip USE TO TEST SUGAR DAILY   Accu-Chek Softclix Lancets lancets daily.   Alpha-Lipoic Acid 600 MG CAPS Take 600 mg by mouth daily.   amLODipine (NORVASC) 5 MG tablet Take 5 mg by mouth daily.   amoxicillin (AMOXIL) 500 MG tablet Take 4 tablets (2,000 mg total) by mouth as directed. 1 hour prior to dental work including cleanings   aspirin EC 81 MG tablet Take 81 mg by mouth daily. Swallow whole.   Azelastine HCl 137 MCG/SPRAY SOLN Place 1 spray into the nose 2 (two) times daily as needed (allergies).   Blood Glucose Monitoring Suppl (ACCU-CHEK GUIDE ME) w/Device KIT USE AS DIRECTED TO TEST SUGAR DAILY   carvedilol (COREG) 6.25 MG tablet Take 6.25 mg by mouth 2 (two) times a day.    Cholecalciferol (VITAMIN D3) 50 MCG (2000 UT) TABS Take 2,000 Units by mouth daily.   CINNAMON PO Take 1,000 mg by mouth daily.   [employer] SENNA 8.6 MG tablet TAKE 1 TABLET (8.6 MG TOTAL) BY MOUTH DAILY AS NEEDED FOR MILD CONSTIPATION.   EPINEPHrine 0.3 mg/0.3 mL IJ SOAJ injection Inject 0.3 mg into the muscle as needed for anaphylaxis.   ferrous sulfate ER (SLOW FE) 142 (45 Fe) MG TBCR tablet Take 45 mg by mouth every Monday, Wednesday, and Friday.   furosemide (LASIX) 20 MG tablet Take 20 mg by mouth once a week. In the morning   gabapentin (NEURONTIN) 800 MG tablet Take 800-1,600 mg by mouth See admin instructions. Take 1 tablet (800  mg) by mouth in the morning, 1 tablet (800 mg) by mouth at noon, & take 2 tablets (1600 mg) by mouth at bedtime.   loratadine (CLARITIN) 10 MG tablet Take 10 mg by mouth daily as needed for allergies.   metFORMIN (GLUCOPHAGE-XR) 500 MG 24 hr tablet Take 500 mg by mouth daily with breakfast.   nitroGLYCERIN (NITROSTAT) 0.4 MG SL tablet Place 0.4 mg under the tongue every 5 (five) minutes as needed for chest pain.   Omega-3 Fatty Acids (FISH OIL) 1000 MG CAPS Take 1,000 mg by mouth 3 (three) times daily.   potassium chloride SA (KLOR-CON M) 20 MEQ tablet Take 20 mEq by  mouth daily.   Propylene Glycol (SYSTANE BALANCE) 0.6 % SOLN Place 1 drop into both eyes daily.   rosuvastatin (CRESTOR) 20 MG tablet Take 20 mg by mouth at bedtime.   triamcinolone cream (KENALOG) 0.5 % Apply 1 Application topically daily as needed (irritation).     Allergies:   Tizanidine, Venlafaxine, Codeine, and Tramadol   Social History   Socioeconomic History   Marital status: Widowed    Spouse name: Not on file   Number of children: Not on file   Years of education: Not on file   Highest education level: Not on file  Occupational History   Not on file  Tobacco Use   Smoking status: Every Day    Packs/day: 0.25    Years: 35.00    Additional pack years: 0.00    Total pack years: 8.75    Types: Cigarettes   Smokeless tobacco: Never  Vaping Use   Vaping Use: Never used  Substance and Sexual Activity   Alcohol use: Yes   Drug use: No   Sexual activity: Not on file  Other Topics Concern   Not on file  Social History Narrative   Son lives with her. Spouse died 06/13/2019   Social Determinants of Health   Financial Resource Strain: Not on file  Food Insecurity: No Food Insecurity (07/26/2022)   Hunger Vital Sign    Worried About Running Out of Food in the Last Year: Never true    Ran Out of Food in the Last Year: Never true  Transportation Needs: No Transportation Needs (07/26/2022)   PRAPARE - Therapist, art (Medical): No    Lack of Transportation (Non-Medical): No  Physical Activity: Not on file  Stress: Not on file  Social Connections: Not on file     Family History: The patient's family history is negative for Breast cancer.  ROS:   Please see the history of present illness.    All other systems reviewed and are negative.  EKGs/Labs/Other Studies Reviewed:    TAVR OPERATIVE NOTE     Date of Procedure:                10/13/2022   Preoperative Diagnosis:      Severe Aortic Stenosis    Postoperative Diagnosis:    Same    Procedure:        Transcatheter Aortic Valve Replacement - Percutaneous Transfemoral Approach             Edwards Sapien 3 Ultra Resilia THV (size 23 mm, serial # [id number])              Co-Surgeons:                        Evelene Croon, MD and Tonny Bollman, MD   Anesthesiologist:                  Achille Rich, MD   Echocardiographer:              Guinevere Scarlet, MD   Pre-operative Echo Findings: Severe aortic stenosis Normal/vigorous left ventricular systolic function   Post-operative Echo Findings: No paravalvular leak Normal left ventricular systolic function  _____________________________  Cardiac Studies & Procedures   CARDIAC CATHETERIZATION  CARDIAC CATHETERIZATION 09/15/2022  Narrative 1.  Widely patent coronary arteries with no obstructive CAD identified 2.  Continued patency of the LIMA to LAD graft 3.  Calcified, restricted aortic valve seen on fluoroscopy  with no attempt made to cross the valve (severe AS by echo with mean transvalvular gradient 45 mmHg)  Plan: Continue TAVR evaluation  Findings Coronary Findings Diagnostic  Dominance: Right  Left Main Vessel is angiographically normal.  Left Anterior Descending The vessel exhibits minimal luminal irregularities. The LAD is patent throughout.  The proximal vessel has mild irregularities without stenosis.  There is competitive filling in the mid and distal  LAD after the insertion of the LIMA graft.  Left Circumflex Vessel is angiographically normal. The circumflex is angiographically normal and supplies a large first OM branch with no stenosis.  Right Coronary Artery The vessel exhibits minimal luminal irregularities. Dominant vessel with no obstructive disease.  The RCA is patent throughout and supplies a PDA with no stenosis.  LIMA LIMA Graft To Mid LAD LIMA graft was visualized by angiography and is normal in caliber.  The graft exhibits no disease. The LIMA to LAD graft is patent and beyond the graft insertion site there is competitive filling from native LAD flow.  Intervention  No interventions have been documented.     ECHOCARDIOGRAM  ECHOCARDIOGRAM COMPLETE 10/21/2022  Narrative ECHOCARDIOGRAM REPORT    Patient Name:   Renee [name] Date of Exam: 10/14/2022 Medical Rec #:  [id number]     Height:       63.0 in Accession #:    [id number]    Weight:       160.0 lb Date of Birth:  [birth year]     BSA:          1.759 m Patient Age:    72 years      BP:           144/69 mmHg Patient Gender: F             HR:           74 bpm. Exam Location:  Inpatient  Procedure: 2D Echo, Color Doppler and Cardiac Doppler  Indications:    Post TAVR evaluation  History:        Patient has prior history of Echocardiogram examinations, most recent 10/13/2022. CAD, Prior CABG, chronic kidney disease; Risk Factors:Diabetes and Hypertension. Aortic Valve: 23 mm Edwards S3UR valve is present in the aortic position.  Sonographer:    Delcie Roch RDCS Referring Phys: [id number] Octavian Godek R Crescencio Jozwiak  IMPRESSIONS   1. Left ventricular ejection fraction, by estimation, is >75%. The left ventricle has hyperdynamic function. The left ventricle has no regional wall motion abnormalities. Left ventricular diastolic parameters are consistent with Grade I diastolic dysfunction (impaired relaxation). 2. Right ventricular systolic function is normal. The right  ventricular size is normal. There is normal pulmonary artery systolic pressure. The estimated right ventricular systolic pressure is 27.6 mmHg. 3. The mitral valve is degenerative. Trivial mitral valve regurgitation. No evidence of mitral stenosis. Moderate mitral annular calcification. 4. Tricuspid valve regurgitation is mild to moderate. 5. 23 mm S3. Vmax 2.1 m/s, MG 10 mmHG, EOA 2.63. No regurgitation or paravalvular leak. Normal prosthesis. The aortic valve has been repaired/replaced. Aortic valve regurgitation is not visualized. There is a 23 mm Edwards S3UR valve present in the aortic position. Echo findings are consistent with normal structure and function of the aortic valve prosthesis. 6. The inferior vena cava is normal in size with greater than 50% respiratory variability, suggesting right atrial pressure of 3 mmHg.  FINDINGS Left Ventricle: Left ventricular ejection fraction, by estimation, is >75%. The left ventricle has hyperdynamic function. The left ventricle has no  regional wall motion abnormalities. The left ventricular internal cavity size was normal in size. There is no left ventricular hypertrophy. Left ventricular diastolic parameters are consistent with Grade I diastolic dysfunction (impaired relaxation).  Right Ventricle: The right ventricular size is normal. No increase in right ventricular wall thickness. Right ventricular systolic function is normal. There is normal pulmonary artery systolic pressure. The tricuspid regurgitant velocity is 2.48 m/s, and with an assumed right atrial pressure of 3 mmHg, the estimated right ventricular systolic pressure is 27.6 mmHg.  Left Atrium: Left atrial size was normal in size.  Right Atrium: Right atrial size was normal in size.  Pericardium: There is no evidence of pericardial effusion. Presence of epicardial fat layer.  Mitral Valve: The mitral valve is degenerative in appearance. Moderate mitral annular calcification. Trivial  mitral valve regurgitation. No evidence of mitral valve stenosis.  Tricuspid Valve: The tricuspid valve is grossly normal. Tricuspid valve regurgitation is mild to moderate. No evidence of tricuspid stenosis.  Aortic Valve: 23 mm S3. Vmax 2.1 m/s, MG 10 mmHG, EOA 2.63. No regurgitation or paravalvular leak. Normal prosthesis. The aortic valve has been repaired/replaced. Aortic valve regurgitation is not visualized. Aortic valve mean gradient measures 10.0 mmHg. Aortic valve peak gradient measures 18.0 mmHg. Aortic valve area, by VTI measures 2.63 cm. There is a 23 mm Edwards S3UR valve present in the aortic position. Echo findings are consistent with normal structure and function of the aortic valve prosthesis.  Pulmonic Valve: The pulmonic valve was grossly normal. Pulmonic valve regurgitation is not visualized. No evidence of pulmonic stenosis.  Aorta: The aortic root and ascending aorta are structurally normal, with no evidence of dilitation.  Venous: The inferior vena cava is normal in size with greater than 50% respiratory variability, suggesting right atrial pressure of 3 mmHg.  IAS/Shunts: The atrial septum is grossly normal.   LEFT VENTRICLE PLAX 2D LVIDd:         4.70 cm   Diastology LVIDs:         2.90 cm   LV e' medial:    7.07 cm/s LV PW:         1.10 cm   LV E/e' medial:  14.4 LV IVS:        1.30 cm   LV e' lateral:   8.05 cm/s LVOT diam:     2.00 cm   LV E/e' lateral: 12.7 LV SV:         117 LV SV Index:   67 LVOT Area:     3.14 cm   RIGHT VENTRICLE             IVC RV Basal diam:  3.00 cm     IVC diam: 2.00 cm RV S prime:     11.30 cm/s TAPSE (M-mode): 1.8 cm  LEFT ATRIUM             Index        RIGHT ATRIUM           Index LA diam:        4.20 cm 2.39 cm/m   RA Area:     15.50 cm LA Vol (A2C):   59.6 ml 33.89 ml/m  RA Volume:   41.60 ml  23.65 ml/m LA Vol (A4C):   58.7 ml 33.38 ml/m LA Biplane Vol: 60.8 ml 34.57 ml/m AORTIC VALVE AV Area (Vmax):    2.66  cm AV Area (Vmean):   2.69 cm AV Area (VTI):     2.63  cm AV Vmax:           212.00 cm/s AV Vmean:          143.000 cm/s AV VTI:            0.447 m AV Peak Grad:      18.0 mmHg AV Mean Grad:      10.0 mmHg LVOT Vmax:         179.50 cm/s LVOT Vmean:        122.500 cm/s LVOT VTI:          0.374 m LVOT/AV VTI ratio: 0.84  AORTA Ao Asc diam: 3.20 cm  MITRAL VALVE                TRICUSPID VALVE MV Area (PHT): 3.31 cm     TR Peak grad:   24.6 mmHg MV Decel Time: 229 msec     TR Vmax:        248.00 cm/s MV E velocity: 102.00 cm/s MV A velocity: 116.00 cm/s  SHUNTS MV E/A ratio:  0.88         Systemic VTI:  0.37 m Systemic Diam: 2.00 cm  Lennie Odor MD Electronically signed by Lennie Odor MD Signature Date/Time: 10/21/2022/8:47:31 PM    Final     CT SCANS  CT CORONARY MORPH W/CTA COR W/SCORE 09/23/2022  Addendum 09/23/2022 12:59 PM ADDENDUM REPORT: 09/23/2022 12:56  EXAM: OVER-READ INTERPRETATION  CT CHEST  The following report is an over-read performed by radiologist Dr. Jacob Moores [facility] on 09/23/2022. This over-read does not include interpretation of cardiac or coronary anatomy or pathology. The cardiac TAVR interpretation by the cardiologist is attached.  COMPARISON:  None.  FINDINGS: Extracardiac findings will be described separately under dictation for contemporaneously obtained CTA chest, abdomen and pelvis.  IMPRESSION: Please see separate dictation for contemporaneously obtained CTA chest, abdomen and pelvis dated 09/23/2022 for full description of relevant extracardiac findings.   Electronically Signed By: Allegra Lai M.D. On: 09/23/2022 12:56  Narrative CLINICAL DATA:  Aortic valve replacement (TAVR), pre-op eval  EXAM: Cardiac TAVR CT  TECHNIQUE: The patient was scanned on a Siemens Force 192 slice scanner. A 120 kV retrospective scan was triggered in the descending thoracic aorta at 111 HU's. Gantry  rotation speed was 270 msecs and collimation was .9 mm. The 3D data set was reconstructed in 5% intervals of the R-R cycle. Systolic and diastolic phases were analyzed on a dedicated work station using MPR, MIP and VRT modes. The patient received OMNIPAQUE IOHEXOL 350 MG/ML SOLN of contrast.  FINDINGS: Aortic Valve:  Tricuspid aortic valve with severely reduced cusp excursion. Severely thickened and severely calcified aortic valve cusps.  AV calcium score: 1241  Virtual Basal Annulus Measurements:  Maximum/Minimum Diameter: 23.2 x 19.6 mm  Perimeter: 66.9 mm  Area:  346 mm2  No significant LVOT calcifications.  Membranous septal length: 5 mm  Based on these measurements, the annulus would be suitable for a 23 mm Sapien 3 valve. Alternatively, Heart Team can consider 26 mm Evolut valve. Recommend Heart Team discussion for valve selection.  Sinus of Valsalva Measurements:  Non-coronary:  29 mm  Right - coronary:  27 mm  Left - coronary:  29 mm  Sinus of Valsalva Height:  Left: 18 mm  Right: 21 mm  Aorta: Conventional 3 vessel branch pattern of aortic arch.  Sinotubular Junction:  26 mm  Ascending Thoracic Aorta:  32 mm  Aortic Arch:  29 mm  Descending Thoracic Aorta:  25 mm  Coronary Artery Height above Annulus:  Left main: 12 mm  Right coronary: 17 mm  Coronary Arteries: The study was performed without use of NTG and insufficient for plaque evaluation. Coronary artery calcium seen in 3 vessel distribution. S/p CABG, patent LIMA to LAD.  Optimum Fluoroscopic Angle for Delivery: LAO 1 CAU 1  OTHER:  Left atrial appendage: No thrombus.  Mitral valve: Degenerative, moderate-severe mitral annular calcifications.  Pulmonary artery: Mildly dilated, 29 mm.  Pulmonary veins: Normal anatomy.  IMPRESSION: 1. Tricuspid aortic valve with severely reduced cusp excursion. Severely thickened and severely calcified aortic valve cusps. 2. Aortic  valve calcium score: 1241 3. Annulus area: 346 mm2, suitable for 23 mm Sapien 3 valve. No LVOT calcifications. Membranous septal length 5 mm. 4. Sufficient coronary artery heights from annulus. 5. Optimum fluoroscopic angle for delivery: LAO 1 CAU 1 6. Moderate-severe mitral annular calcifications.  Electronically Signed: By: Weston Brass M.D. On: 09/23/2022 12:39          EKG:  EKG is ordered today.  The ekg ordered today demonstrates sinus with nonspecific ST/TW abnormality. HR 77  Recent Labs: 10/09/2022: ALT 20 10/14/2022: BUN 24; Creatinine, Ser 1.12; Hemoglobin 9.8; Magnesium 2.0; Platelets 147; Potassium 3.8; Sodium 139  Recent Lipid Panel    Component Value Date/Time   CHOL 109 10/22/2011 0340   TRIG 225 (H) 10/22/2011 0340   HDL 16 (L) 10/22/2011 0340   VLDL 45 (H) 10/22/2011 0340   LDLCALC 48 10/22/2011 0340     Risk Assessment/Calculations:       Physical Exam:    VS:  BP 132/66   Pulse 77   Ht 5\' 3"  (1.6 m)   Wt 160 lb 3.2 oz (72.7 kg)   SpO2 92%   BMI 28.38 kg/m     Wt Readings from Last 3 Encounters:  10/23/22 160 lb 3.2 oz (72.7 kg)  10/13/22 160 lb (72.6 kg)  09/30/22 160 lb (72.6 kg)     GEN:  Well nourished, well developed in no acute distress HEENT: Normal NECK: No JVD LYMPHATICS: No lymphadenopathy CARDIAC: RRR, no murmurs, rubs, gallops RESPIRATORY:  Clear to auscultation without rales, wheezing or rhonchi  ABDOMEN: Soft, non-tender, non-distended MUSCULOSKELETAL:  No edema; No deformity  SKIN: Warm and dry.  Groin sites clear without hematoma or ecchymosis  NEUROLOGIC:  Alert and oriented x 3 PSYCHIATRIC:  Normal affect   ASSESSMENT:    1. S/P TAVR (transcatheter aortic valve replacement)   2. Hx of CABG   3. Essential hypertension   4. 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 Pulmonary nodule   6. Tobacco abuse    PLAN:    In order of problems listed above:  Severe AS s/p TAVR: doing well 1  week out from TAVR. Groins look good. ECG with no HAVB. Continue Asprin 81 mg daily. SBE prophylaxis discussed; I have RX'd amoxicillin. I will see her back next month for follow up and echo.   CAD s/p CABGx1V: recent cath showed widely patent coronary arteries with no obstructive CAD and continued patency of the LIMA to LAD graft. Continue aspirin, BB and statin.     HTN: BP well controlled. No changes made.   Previous GI bleeding with anemia: hg has remained stable ~11. No s/s bleeding.    Pulmonary nodule: pre TAVR CT showed a "small solid pulmonary nodule of the right upper lobe measuring 3 mm. No follow-up needed  if patient is low-risk. Non-contrast chest CT can be considered in 12 months if patient is high-risk."   Given long smoking history will get this set up   Tobacco abuse: down on her cigarette consumption. Had long talk with PCP recently and very motivated to quit because she "wants to live." I provided encouragement and offered nicotine replacement therapy but for now she would like to try on her own. We spent about 5 minutes discussing this.      Cardiac Rehabilitation Eligibility Assessment  The patient is ready to start cardiac rehabilitation from a cardiac standpoint.     Medication Adjustments/Labs and Tests Ordered: Current medicines are reviewed at length with the patient today.  Concerns regarding medicines are outlined above.  Orders Placed This Encounter  Procedures   EKG 12-Lead   Meds ordered this encounter  Medications   amoxicillin (AMOXIL) 500 MG tablet    Sig: Take 4 tablets (2,000 mg total) by mouth as directed. 1 hour prior to dental work including cleanings    Dispense:  12 tablet    Refill:  12    Order Specific Question:   Supervising Provider    Answer:   Tonny Bollman [[id number]]    Patient Instructions  Medication Instructions:  Start Amoxicillin 500 mg, take 4 tablets by mouth 1 hour prior to dental procedures and cleanings.   *If you need a  refill on your cardiac medications before your next appointment, please call your pharmacy*   Lab Work: None ordered   If you have labs (blood work) drawn today and your tests are completely normal, you will receive your results only by: MyChart Message (if you have MyChart) OR A paper copy in the mail If you have any lab test that is abnormal or we need to change your treatment, we will call you to review the results.   Testing/Procedures: Echocardiogram as scheduled    Follow-Up: Follow up as scheduled   Other Instructions     Signed, Cline Crock, PA-C  10/23/2022 10:11 AM     [facility]

## 2022-10-23 ENCOUNTER — Ambulatory Visit: Payer: No Typology Code available for payment source | Attending: Physician Assistant | Admitting: Physician Assistant

## 2022-10-23 VITALS — BP 132/66 | HR 77 | Ht 63.0 in | Wt 160.2 lb

## 2022-10-23 DIAGNOSIS — F17210 Nicotine dependence, cigarettes, uncomplicated: Secondary | ICD-10-CM | POA: Diagnosis not present

## 2022-10-23 DIAGNOSIS — Z951 Presence of aortocoronary bypass graft: Secondary | ICD-10-CM | POA: Diagnosis not present

## 2022-10-23 DIAGNOSIS — Z952 Presence of prosthetic heart valve: Secondary | ICD-10-CM

## 2022-10-23 DIAGNOSIS — I10 Essential (primary) hypertension: Secondary | ICD-10-CM | POA: Diagnosis not present

## 2022-10-23 DIAGNOSIS — K31811 Angiodysplasia of stomach and duodenum with bleeding: Secondary | ICD-10-CM

## 2022-10-23 DIAGNOSIS — Z720 Tobacco use: Secondary | ICD-10-CM

## 2022-10-23 DIAGNOSIS — R911 Solitary pulmonary nodule: Secondary | ICD-10-CM

## 2022-10-23 MED ORDER — AMOXICILLIN 500 MG PO TABS: 2000.0000 mg | ORAL_TABLET | ORAL | 12 refills | Status: AC

## 2022-10-23 NOTE — Patient Instructions (Addendum)
Medication Instructions:  Start Amoxicillin 500 mg, take 4 tablets by mouth 1 hour prior to dental procedures and cleanings.   *If you need a refill on your cardiac medications before your next appointment, please call your pharmacy*   Lab Work: None ordered   If you have labs (blood work) drawn today and your tests are completely normal, you will receive your results only by: MyChart Message (if you have MyChart) OR A paper copy in the mail If you have any lab test that is abnormal or we need to change your treatment, we will call you to review the results.   Testing/Procedures: Echocardiogram as scheduled    Follow-Up: Follow up as scheduled   Other Instructions

## 2022-10-26 DIAGNOSIS — Z8719 Personal history of other diseases of the digestive system: Secondary | ICD-10-CM | POA: Diagnosis not present

## 2022-10-26 DIAGNOSIS — D509 Iron deficiency anemia, unspecified: Secondary | ICD-10-CM | POA: Diagnosis not present

## 2022-10-27 ENCOUNTER — Other Ambulatory Visit (HOSPITAL_COMMUNITY): Payer: No Typology Code available for payment source

## 2022-10-29 DIAGNOSIS — I10 Essential (primary) hypertension: Secondary | ICD-10-CM | POA: Diagnosis not present

## 2022-10-29 DIAGNOSIS — E7800 Pure hypercholesterolemia, unspecified: Secondary | ICD-10-CM | POA: Diagnosis not present

## 2022-10-29 DIAGNOSIS — E1142 Type 2 diabetes mellitus with diabetic polyneuropathy: Secondary | ICD-10-CM | POA: Diagnosis not present

## 2022-10-29 DIAGNOSIS — Z8719 Personal history of other diseases of the digestive system: Secondary | ICD-10-CM | POA: Diagnosis not present

## 2022-10-29 DIAGNOSIS — D509 Iron deficiency anemia, unspecified: Secondary | ICD-10-CM | POA: Diagnosis not present

## 2022-11-05 DIAGNOSIS — Z953 Presence of xenogenic heart valve: Secondary | ICD-10-CM | POA: Diagnosis not present

## 2022-11-05 DIAGNOSIS — E7800 Pure hypercholesterolemia, unspecified: Secondary | ICD-10-CM | POA: Diagnosis not present

## 2022-11-05 DIAGNOSIS — D649 Anemia, unspecified: Secondary | ICD-10-CM | POA: Diagnosis not present

## 2022-11-05 DIAGNOSIS — G63 Polyneuropathy in diseases classified elsewhere: Secondary | ICD-10-CM | POA: Diagnosis not present

## 2022-11-05 DIAGNOSIS — I10 Essential (primary) hypertension: Secondary | ICD-10-CM | POA: Diagnosis not present

## 2022-11-05 DIAGNOSIS — I2510 Atherosclerotic heart disease of native coronary artery without angina pectoris: Secondary | ICD-10-CM | POA: Diagnosis not present

## 2022-11-05 DIAGNOSIS — M48062 Spinal stenosis, lumbar region with neurogenic claudication: Secondary | ICD-10-CM | POA: Diagnosis not present

## 2022-11-05 DIAGNOSIS — E1142 Type 2 diabetes mellitus with diabetic polyneuropathy: Secondary | ICD-10-CM | POA: Diagnosis not present

## 2022-11-05 DIAGNOSIS — N1831 Chronic kidney disease, stage 3a: Secondary | ICD-10-CM | POA: Diagnosis not present

## 2022-11-16 NOTE — Progress Notes (Signed)
[facility]   MULTIDISCIPLINARY HEART VALVE CLINIC                                     Cardiology Office Note:    Date:  11/19/2022   ID:  Renee [name], DOB May 29, 1951, MRN [id number]  PCP:  Lynnea Ferrier, MD  [facility] Cardiologist:  Dr. Juliann Pares /  Dr. Excell Seltzer & Dr Laneta Simmers (TAVR)  [facility] Electrophysiologist:  None   Referring MD: Lynnea Ferrier, MD   1 month s/p TAVR  History of Present Illness:    Renee [name] is a 72 y.o. female with a hx of CABG x 1 in 1996, HTN, CKD stage III, T2DM, GI bleeding, and severe aortic stenosis s/p TAVR (10/13/22) who presents to clinic for follow up.   She has a longstanding heart murmur and she has been followed by Dr. Juliann Pares for moderate aortic stenosis. The patient has a remote history of CABG x 1 with a LIMA to the LAD by Dr. Laneta Simmers in 1996. The patient was hospitalized in 07/2022 with melena and lower GI bleeding. 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 She was placed on a PPI empirically.   The patient's most recent echocardiogram 09/15/22 demonstrated worsening of her aortic stenosis with a mean grad 38 mmHg, peak grad 63.7 mmHg, AVA 0.99 cm2, DVI 0.29, SVI 54, as well as mild-mod TR and mild AI. EF >75%. [facility] 09/12/22 showed widely patent coronary arteries with no obstructive CAD and continued patency of the LIMA to LAD graft.    The patient was evaluated by the multidisciplinary valve team and underwent successful TAVR with a 23 mm Edwards Sapien 3 Ultra Resilia THV via the TF approach on 10/13/22. Post operative echo showed EF >75%, normally functioning TAVR with a mean gradient of 10 mmHg and no PVL as well as mild to moderate TR. She was discharged on a baby aspirin 81mg  daily.   Today the patient presents to clinic for follow up. Cutting back on smoking but hasn't quit. No CP or SOB. No LE edema, orthopnea or PND. No  dizziness or syncope. No blood in stool or urine. No palpitations.   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 Excell Seltzer & Dr. Laneta Simmers   Severe aortic stenosis     Past Surgical History:  Procedure Laterality Date   ABDOMINAL HYSTERECTOMY     BREAST EXCISIONAL BIOPSY Right 20+ yrs ago   benign   CARDIAC SURGERY     cabg   CARPAL TUNNEL RELEASE Bilateral    COLONOSCOPY N/A 02/24/2017   Procedure: COLONOSCOPY;  Surgeon: Toledo, Boykin Nearing, MD;  Location: [facility] ENDOSCOPY;  Service: Endoscopy;  Laterality: N/A;   COLONOSCOPY N/A 07/28/2022   Procedure: COLONOSCOPY;  Surgeon: Toledo, Boykin Nearing, MD;  Location: [facility] ENDOSCOPY;  Service: Gastroenterology;  Laterality: N/A;   CORONARY ARTERY BYPASS GRAFT     1996  CORONARY/GRAFT ANGIOGRAPHY N/A 09/15/2022   Procedure: CORONARY/GRAFT ANGIOGRAPHY;  Surgeon: Tonny Bollman, MD;  Location: [facility] INVASIVE CV LAB;  Service: Cardiovascular;  Laterality: N/A;   ESOPHAGOGASTRODUODENOSCOPY (EGD) WITH PROPOFOL N/A 07/27/2022   Procedure: ESOPHAGOGASTRODUODENOSCOPY (EGD) WITH PROPOFOL;  Surgeon: Toledo, Boykin Nearing, MD;  Location: [facility] ENDOSCOPY;  Service: Gastroenterology;  Laterality: N/A;   HERNIA REPAIR     umbilical   INTRAOPERATIVE TRANSTHORACIC ECHOCARDIOGRAM N/A 10/13/2022   Procedure: INTRAOPERATIVE TRANSTHORACIC ECHOCARDIOGRAM;  Surgeon: Tonny Bollman, MD;  Location: [facility] INVASIVE CV LAB;  Service: Open Heart Surgery;  Laterality: N/A;   LUMBAR LAMINECTOMY/DECOMPRESSION MICRODISCECTOMY N/A 06/25/2021   Procedure: L2-3 DECOMPRESSION;  Surgeon: Venetia Night, MD;  Location: [facility] ORS;  Service: Neurosurgery;  Laterality: N/A;   TOTAL KNEE  ARTHROPLASTY Left 10/25/2018   Procedure: TOTAL KNEE ARTHROPLASTY - LEFT - DIABETIC;  Surgeon: Kennedy Bucker, MD;  Location: [facility] ORS;  Service: Orthopedics;  Laterality: Left;   TRANSCATHETER AORTIC VALVE REPLACEMENT, TRANSFEMORAL Right 10/13/2022   Procedure: Transcatheter Aortic Valve Replacement, Transfemoral;  Surgeon: Tonny Bollman, MD;  Location: [facility] INVASIVE CV LAB;  Service: Open Heart Surgery;  Laterality: Right;    Current Medications: Current Meds  Medication Sig   ACCU-CHEK GUIDE test strip USE TO TEST SUGAR DAILY   Accu-Chek Softclix Lancets lancets daily.   Alpha-Lipoic Acid 600 MG CAPS Take 600 mg by mouth daily.   amLODipine (NORVASC) 5 MG tablet Take 5 mg by mouth daily.   amoxicillin (AMOXIL) 500 MG tablet Take 4 tablets (2,000 mg total) by mouth as directed. 1 hour prior to dental work including cleanings   aspirin EC 81 MG tablet Take 81 mg by mouth daily. Swallow whole.   Azelastine HCl 137 MCG/SPRAY SOLN Place 1 spray into the nose 2 (two) times daily as needed (allergies).   Blood Glucose Monitoring Suppl (ACCU-CHEK GUIDE ME) w/Device KIT USE AS DIRECTED TO TEST SUGAR DAILY   carvedilol (COREG) 6.25 MG tablet Take 6.25 mg by mouth 2 (two) times a day.    Cholecalciferol (VITAMIN D3) 50 MCG (2000 UT) TABS Take 2,000 Units by mouth daily.   CINNAMON PO Take 1,000 mg by mouth daily.   [employer] SENNA 8.6 MG tablet TAKE 1 TABLET (8.6 MG TOTAL) BY MOUTH DAILY AS NEEDED FOR MILD CONSTIPATION.   EPINEPHrine 0.3 mg/0.3 mL IJ SOAJ injection Inject 0.3 mg into the muscle as needed for anaphylaxis.   ferrous sulfate ER (SLOW FE) 142 (45 Fe) MG TBCR tablet Take 45 mg by mouth every Monday, Wednesday, and Friday.   furosemide (LASIX) 20 MG tablet Take 20 mg by mouth once a week. In the morning   gabapentin (NEURONTIN) 800 MG tablet Take 800-1,600 mg by mouth See admin instructions. Take 1 tablet (800 mg) by mouth in the morning, 1 tablet (800 mg) by mouth at noon, & take 2 tablets (1600  mg) by mouth at bedtime.   loratadine (CLARITIN) 10 MG tablet Take 10 mg by mouth daily as needed for allergies.   metFORMIN (GLUCOPHAGE-XR) 500 MG 24 hr tablet Take 500 mg by mouth daily with breakfast.   nitroGLYCERIN (NITROSTAT) 0.4 MG SL tablet Place 0.4 mg under the tongue every 5 (five) minutes as needed for chest pain.   Omega-3 Fatty Acids (FISH OIL) 1000 MG CAPS Take 1,000 mg by mouth 3 (three) times daily.   potassium chloride SA (KLOR-CON M) 20 MEQ tablet Take 20 mEq by mouth daily.   Propylene Glycol (SYSTANE BALANCE) 0.6 % SOLN Place 1 drop into  both eyes daily.   rosuvastatin (CRESTOR) 20 MG tablet Take 20 mg by mouth at bedtime.   triamcinolone cream (KENALOG) 0.5 % Apply 1 Application topically daily as needed (irritation).     Allergies:   Tizanidine, Venlafaxine, Codeine, and Tramadol   Social History   Socioeconomic History   Marital status: Widowed    Spouse name: Not on file   Number of children: Not on file   Years of education: Not on file   Highest education level: Not on file  Occupational History   Not on file  Tobacco Use   Smoking status: Every Day    Packs/day: 0.25    Years: 35.00    Additional pack years: 0.00    Total pack years: 8.75    Types: Cigarettes   Smokeless tobacco: Never  Vaping Use   Vaping Use: Never used  Substance and Sexual Activity   Alcohol use: Yes   Drug use: No   Sexual activity: Not on file  Other Topics Concern   Not on file  Social History Narrative   Son lives with her. Spouse died 06-13-19   Social Determinants of Health   Financial Resource Strain: Not on file  Food Insecurity: No Food Insecurity (07/26/2022)   Hunger Vital Sign    Worried About Running Out of Food in the Last Year: Never true    Ran Out of Food in the Last Year: Never true  Transportation Needs: No Transportation Needs (07/26/2022)   PRAPARE - Administrator, Civil Service (Medical): No    Lack of Transportation (Non-Medical): No   Physical Activity: Not on file  Stress: Not on file  Social Connections: Not on file     Family History: The patient's family history is negative for Breast cancer.  ROS:   Please see the history of present illness.    All other systems reviewed and are negative.  EKGs/Labs/Other Studies Reviewed:     Cardiac Studies & Procedures   CARDIAC CATHETERIZATION  CARDIAC CATHETERIZATION 09/15/2022  Narrative 1.  Widely patent coronary arteries with no obstructive CAD identified 2.  Continued patency of the LIMA to LAD graft 3.  Calcified, restricted aortic valve seen on fluoroscopy with no attempt made to cross the valve (severe AS by echo with mean transvalvular gradient 45 mmHg)  Plan: Continue TAVR evaluation  Findings Coronary Findings Diagnostic  Dominance: Right  Left Main Vessel is angiographically normal.  Left Anterior Descending The vessel exhibits minimal luminal irregularities. The LAD is patent throughout.  The proximal vessel has mild irregularities without stenosis.  There is competitive filling in the mid and distal LAD after the insertion of the LIMA graft.  Left Circumflex Vessel is angiographically normal. The circumflex is angiographically normal and supplies a large first OM branch with no stenosis.  Right Coronary Artery The vessel exhibits minimal luminal irregularities. Dominant vessel with no obstructive disease.  The RCA is patent throughout and supplies a PDA with no stenosis.  LIMA LIMA Graft To Mid LAD LIMA graft was visualized by angiography and is normal in caliber.  The graft exhibits no disease. The LIMA to LAD graft is patent and beyond the graft insertion site there is competitive filling from native LAD flow.  Intervention  No interventions have been documented.     ECHOCARDIOGRAM  ECHOCARDIOGRAM COMPLETE 11/18/2022  Narrative ECHOCARDIOGRAM REPORT    Patient Name:   Renee [name] Date of Exam: 11/18/2022 Medical Rec #:   [id number]  Height:       63.0 in Accession #:    [id number]    Weight:       160.2 lb Date of Birth:  [birth year]     BSA:          1.760 m Patient Age:    72 years      BP:           132/66 mmHg Patient Gender: F             HR:           66 bpm. Exam Location:  [address]  Procedure: 2D Echo, Cardiac Doppler and Color Doppler  Indications:    Z95.2 Post TAVR evaluation (23mm Edwards Sapien 3 Ultra Resilia)  History:        Patient has prior history of Echocardiogram examinations, most recent 10/14/2022. CAD, Prior CABG; Risk Factors:Hypertension, Diabetes and Dyslipidemia. Severe aortic stenosis. Chronic kidney disease. Overweight.  Sonographer:    Cathie Beams RCS Referring Phys: [id number] Karyn Brull R Lynnann Knudsen  IMPRESSIONS   1. Left ventricular ejection fraction, by estimation, is 65 to 70%. The left ventricle has normal function. The left ventricle has no regional wall motion abnormalities. Left ventricular diastolic parameters were normal. 2. Right ventricular systolic function is normal. The right ventricular size is normal. 3. Left atrial size was mildly dilated. 4. The mitral valve is normal in structure. Trivial mitral valve regurgitation. No evidence of mitral stenosis. 5. Trivial perivalvular leak arond TAVR. The aortic valve is normal in structure. Aortic valve regurgitation is not visualized. No aortic stenosis is present. Echo findings are consistent with normal structure and function of the aortic valve prosthesis. Aortic valve area, by VTI measures 2.19 cm. Aortic valve mean gradient measures 7.0 mmHg. Aortic valve Vmax measures 1.90 m/s. 6. The inferior vena cava is normal in size with greater than 50% respiratory variability, suggesting right atrial pressure of 3 mmHg.  FINDINGS Left Ventricle: Left ventricular ejection fraction, by estimation, is 65 to 70%. The left ventricle has normal function. The left ventricle has no regional wall motion abnormalities. The  left ventricular internal cavity size was normal in size. There is no left ventricular hypertrophy. Left ventricular diastolic parameters were normal.  Right Ventricle: The right ventricular size is normal. No increase in right ventricular wall thickness. Right ventricular systolic function is normal.  Left Atrium: Left atrial size was mildly dilated.  Right Atrium: Right atrial size was normal in size.  Pericardium: There is no evidence of pericardial effusion.  Mitral Valve: The mitral valve is normal in structure. Mild mitral annular calcification. Trivial mitral valve regurgitation. No evidence of mitral valve stenosis.  Tricuspid Valve: The tricuspid valve is normal in structure. Tricuspid valve regurgitation is mild . No evidence of tricuspid stenosis.  Aortic Valve: Trivial perivalvular leak arond TAVR. The aortic valve is normal in structure. Aortic valve regurgitation is not visualized. No aortic stenosis is present. Aortic valve mean gradient measures 7.0 mmHg. Aortic valve peak gradient measures 14.4 mmHg. Aortic valve area, by VTI measures 2.19 cm. There is a 23 mm Sapien prosthetic, stented (TAVR) valve present in the aortic position. Echo findings are consistent with normal structure and function of the aortic valve prosthesis.  Pulmonic Valve: The pulmonic valve was normal in structure. Pulmonic valve regurgitation is trivial. No evidence of pulmonic stenosis.  Aorta: The aortic root is normal in size and structure.  Venous: The inferior vena cava is normal in size with greater than  50% respiratory variability, suggesting right atrial pressure of 3 mmHg.  IAS/Shunts: No atrial level shunt detected by color flow Doppler.   LEFT VENTRICLE PLAX 2D LVIDd:         4.50 cm   Diastology LVIDs:         2.50 cm   LV e' medial:    7.62 cm/s LV PW:         1.10 cm   LV E/e' medial:  13.6 LV IVS:        1.10 cm   LV e' lateral:   9.57 cm/s LVOT diam:     2.30 cm   LV E/e'  lateral: 10.9 LV SV:         94 LV SV Index:   53 LVOT Area:     4.15 cm   RIGHT VENTRICLE RV Basal diam:  2.90 cm RV S prime:     12.20 cm/s TAPSE (M-mode): 1.6 cm  LEFT ATRIUM             Index        RIGHT ATRIUM           Index LA diam:        4.20 cm 2.39 cm/m   RA Area:     15.30 cm LA Vol (A2C):   58.1 ml 33.02 ml/m  RA Volume:   36.60 ml  20.80 ml/m LA Vol (A4C):   25.9 ml 14.72 ml/m LA Biplane Vol: 39.5 ml 22.45 ml/m AORTIC VALVE AV Area (Vmax):    2.08 cm AV Area (Vmean):   2.10 cm AV Area (VTI):     2.19 cm AV Vmax:           190.00 cm/s AV Vmean:          124.000 cm/s AV VTI:            0.429 m AV Peak Grad:      14.4 mmHg AV Mean Grad:      7.0 mmHg LVOT Vmax:         95.00 cm/s LVOT Vmean:        62.600 cm/s LVOT VTI:          0.226 m LVOT/AV VTI ratio: 0.53  AORTA Ao Asc diam: 3.40 cm  MITRAL VALVE                TRICUSPID VALVE MV Area (PHT): 3.66 cm     TR Peak grad:   23.2 mmHg MV Decel Time: 207 msec     TR Vmax:        241.00 cm/s MV E velocity: 104.00 cm/s MV A velocity: 93.80 cm/s   SHUNTS MV E/A ratio:  1.11         Systemic VTI:  0.23 m Systemic Diam: 2.30 cm  Arvilla Meres MD Electronically signed by Arvilla Meres MD Signature Date/Time: 11/18/2022/3:54:24 PM    Final     CT SCANS  CT CORONARY MORPH W/CTA COR W/SCORE 09/23/2022  Addendum 09/23/2022 12:59 PM ADDENDUM REPORT: 09/23/2022 12:56  EXAM: OVER-READ INTERPRETATION  CT CHEST  The following report is an over-read performed by radiologist Dr. Jacob Moores [facility] on 09/23/2022. This over-read does not include interpretation of cardiac or coronary anatomy or pathology. The cardiac TAVR interpretation by the cardiologist is attached.  COMPARISON:  None.  FINDINGS: Extracardiac findings will be described separately under dictation for contemporaneously obtained CTA chest, abdomen and pelvis.  IMPRESSION: Please see separate dictation  for contemporaneously obtained CTA chest, abdomen and pelvis dated 09/23/2022 for full description of relevant extracardiac findings.   Electronically Signed By: Allegra Lai M.D. On: 09/23/2022 12:56  Narrative CLINICAL DATA:  Aortic valve replacement (TAVR), pre-op eval  EXAM: Cardiac TAVR CT  TECHNIQUE: The patient was scanned on a Siemens Force 192 slice scanner. A 120 kV retrospective scan was triggered in the descending thoracic aorta at 111 HU's. Gantry rotation speed was 270 msecs and collimation was .9 mm. The 3D data set was reconstructed in 5% intervals of the R-R cycle. Systolic and diastolic phases were analyzed on a dedicated work station using MPR, MIP and VRT modes. The patient received OMNIPAQUE IOHEXOL 350 MG/ML SOLN of contrast.  FINDINGS: Aortic Valve:  Tricuspid aortic valve with severely reduced cusp excursion. Severely thickened and severely calcified aortic valve cusps.  AV calcium score: 1241  Virtual Basal Annulus Measurements:  Maximum/Minimum Diameter: 23.2 x 19.6 mm  Perimeter: 66.9 mm  Area:  346 mm2  No significant LVOT calcifications.  Membranous septal length: 5 mm  Based on these measurements, the annulus would be suitable for a 23 mm Sapien 3 valve. Alternatively, Heart Team can consider 26 mm Evolut valve. Recommend Heart Team discussion for valve selection.  Sinus of Valsalva Measurements:  Non-coronary:  29 mm  Right - coronary:  27 mm  Left - coronary:  29 mm  Sinus of Valsalva Height:  Left: 18 mm  Right: 21 mm  Aorta: Conventional 3 vessel branch pattern of aortic arch.  Sinotubular Junction:  26 mm  Ascending Thoracic Aorta:  32 mm  Aortic Arch:  29 mm  Descending Thoracic Aorta:  25 mm  Coronary Artery Height above Annulus:  Left main: 12 mm  Right coronary: 17 mm  Coronary Arteries: The study was performed without use of NTG and insufficient for plaque evaluation. Coronary artery  calcium seen in 3 vessel distribution. S/p CABG, patent LIMA to LAD.  Optimum Fluoroscopic Angle for Delivery: LAO 1 CAU 1  OTHER:  Left atrial appendage: No thrombus.  Mitral valve: Degenerative, moderate-severe mitral annular calcifications.  Pulmonary artery: Mildly dilated, 29 mm.  Pulmonary veins: Normal anatomy.  IMPRESSION: 1. Tricuspid aortic valve with severely reduced cusp excursion. Severely thickened and severely calcified aortic valve cusps. 2. Aortic valve calcium score: 1241 3. Annulus area: 346 mm2, suitable for 23 mm Sapien 3 valve. No LVOT calcifications. Membranous septal length 5 mm. 4. Sufficient coronary artery heights from annulus. 5. Optimum fluoroscopic angle for delivery: LAO 1 CAU 1 6. Moderate-severe mitral annular calcifications.  Electronically Signed: By: Weston Brass M.D. On: 09/23/2022 12:39          EKG:  EKG is ordered today.  The ekg ordered today demonstrates sinus with nonspecific ST/TW abnormality. HR 77  Recent Labs: 10/09/2022: ALT 20 10/14/2022: BUN 24; Creatinine, Ser 1.12; Hemoglobin 9.8; Magnesium 2.0; Platelets 147; Potassium 3.8; Sodium 139  Recent Lipid Panel    Component Value Date/Time   CHOL 109 10/22/2011 0340   TRIG 225 (H) 10/22/2011 0340   HDL 16 (L) 10/22/2011 0340   VLDL 45 (H) 10/22/2011 0340   LDLCALC 48 10/22/2011 0340     Risk Assessment/Calculations:       Physical Exam:    VS:  BP (!) 140/64   Pulse (!) 58   Ht 5\' 3"  (1.6 m)   Wt 160 lb (72.6 kg)   SpO2 99%   BMI 28.34 kg/m     Wt  Readings from Last 3 Encounters:  11/18/22 160 lb (72.6 kg)  10/23/22 160 lb 3.2 oz (72.7 kg)  10/13/22 160 lb (72.6 kg)     GEN:  Well nourished, well developed in no acute distress HEENT: Normal NECK: No JVD LYMPHATICS: No lymphadenopathy CARDIAC: RRR, no murmurs, rubs, gallops RESPIRATORY:  Clear to auscultation without rales, wheezing or rhonchi  ABDOMEN: Soft, non-tender,  non-distended MUSCULOSKELETAL:  No edema; No deformity  SKIN: Warm and dry. NEUROLOGIC:  Alert and oriented x 3 PSYCHIATRIC:  Normal affect   ASSESSMENT:    1. S/P TAVR (transcatheter aortic valve replacement)   2. Hx of CABG   3. Essential hypertension   4. 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 Pulmonary nodule   6. Tobacco abuse     PLAN:    In order of problems listed above:  Severe AS s/p TAVR: echo today showed EF 65%, normally functioning TAVR with a mean gradient of 7 mmHg and trivial PVL. She has NYHA class I symptoms. Continue Asprin 81 mg daily. SBE prophylaxis discussed; she has RX'd amoxicillin. I will see her back next year for follow up and echo.   CAD s/p CABGx1V: recent cath showed widely patent coronary arteries with no obstructive CAD and continued patency of the LIMA to LAD graft. Continue aspirin, BB and statin.     HTN: BP with moderate control. No changes made.   Previous GI bleeding with anemia: hg has remained stable ~11. No s/s bleeding.    Pulmonary nodule: pre TAVR CT showed a "small solid pulmonary nodule of the right upper lobe measuring 3 mm. No follow-up needed if patient is low-risk. Non-contrast chest CT can be considered in 12 months if patient is high-risk."   Given long smoking history will get this set up at our one year visit   Tobacco abuse: still working on cutting back.    Medication Adjustments/Labs and Tests Ordered: Current medicines are reviewed at length with the patient today.  Concerns regarding medicines are outlined above.  No orders of the defined types were placed in this encounter.  No orders of the defined types were placed in this encounter.   Patient Instructions  Medication Instructions:  Your physician recommends that you continue on your current medications as directed. Please refer to the Current Medication list given to you today.  *If you need a refill on your cardiac medications  before your next appointment, please call your pharmacy*   Lab Work: NONE If you have labs (blood work) drawn today and your tests are completely normal, you will receive your results only by: MyChart Message (if you have MyChart) OR A paper copy in the mail If you have any lab test that is abnormal or we need to change your treatment, we will call you to review the results.   Testing/Procedures: NONE   Follow-Up: At [facility], you and your health needs are our priority.  As part of our continuing mission to provide you with exceptional heart care, we have created designated Provider Care Teams.  These Care Teams include your primary Cardiologist (physician) and Advanced Practice Providers (APPs -  Physician Assistants and Nurse Practitioners) who all work together to provide you with the care you need, when you need it.  We recommend signing up for the patient portal called "MyChart".  Sign up information is provided on this After Visit Summary.  MyChart is used to connect with patients for Virtual Visits (  Telemedicine).  Patients are able to view lab/test results, encounter notes, upcoming appointments, etc.  Non-urgent messages can be sent to your provider as well.   To learn more about what you can do with MyChart, go to [email or web address].    Your next appointment:   KEEP SCHEDULED FOLLOW-UP   Signed, Cline Crock, PA-C  11/19/2022 9:55 AM    [facility]

## 2022-11-18 ENCOUNTER — Ambulatory Visit: Payer: No Typology Code available for payment source | Admitting: Physician Assistant

## 2022-11-18 ENCOUNTER — Ambulatory Visit: Payer: No Typology Code available for payment source | Attending: Cardiology

## 2022-11-18 VITALS — BP 140/64 | HR 58 | Ht 63.0 in | Wt 160.0 lb

## 2022-11-18 DIAGNOSIS — K31811 Angiodysplasia of stomach and duodenum with bleeding: Secondary | ICD-10-CM | POA: Insufficient documentation

## 2022-11-18 DIAGNOSIS — R911 Solitary pulmonary nodule: Secondary | ICD-10-CM

## 2022-11-18 DIAGNOSIS — Z952 Presence of prosthetic heart valve: Secondary | ICD-10-CM

## 2022-11-18 DIAGNOSIS — Z951 Presence of aortocoronary bypass graft: Secondary | ICD-10-CM

## 2022-11-18 DIAGNOSIS — I10 Essential (primary) hypertension: Secondary | ICD-10-CM

## 2022-11-18 DIAGNOSIS — Z720 Tobacco use: Secondary | ICD-10-CM | POA: Insufficient documentation

## 2022-11-18 LAB — ECHOCARDIOGRAM COMPLETE
AR max vel: 2.08 cm2
AV Area VTI: 2.19 cm2
AV Area mean vel: 2.1 cm2
AV Mean grad: 7 mmHg
AV Peak grad: 14.4 mmHg
Ao pk vel: 1.9 m/s
Area-P 1/2: 3.66 cm2
S' Lateral: 2.5 cm

## 2022-11-18 NOTE — Patient Instructions (Signed)
Medication Instructions:  Your physician recommends that you continue on your current medications as directed. Please refer to the Current Medication list given to you today.  *If you need a refill on your cardiac medications before your next appointment, please call your pharmacy*   Lab Work: NONE If you have labs (blood work) drawn today and your tests are completely normal, you will receive your results only by: MyChart Message (if you have MyChart) OR A paper copy in the mail If you have any lab test that is abnormal or we need to change your treatment, we will call you to review the results.   Testing/Procedures: NONE   Follow-Up: At [facility], you and your health needs are our priority.  As part of our continuing mission to provide you with exceptional heart care, we have created designated Provider Care Teams.  These Care Teams include your primary Cardiologist (physician) and Advanced Practice Providers (APPs -  Physician Assistants and Nurse Practitioners) who all work together to provide you with the care you need, when you need it.  We recommend signing up for the patient portal called "MyChart".  Sign up information is provided on this After Visit Summary.  MyChart is used to connect with patients for Virtual Visits (Telemedicine).  Patients are able to view lab/test results, encounter notes, upcoming appointments, etc.  Non-urgent messages can be sent to your provider as well.   To learn more about what you can do with MyChart, go to [email or web address].    Your next appointment:   KEEP SCHEDULED FOLLOW-UP 

## 2023-01-02 ENCOUNTER — Other Ambulatory Visit: Payer: Self-pay | Admitting: Neurosurgery

## 2023-01-04 NOTE — Telephone Encounter (Signed)
Refill received for tizanidine.   She has not been seen since April of 2023 and tizanidine is now listed as an allergy.   I have denied the refill. Please let her know if she needs a muscle relaxer to discuss with her PCP.

## 2023-01-05 ENCOUNTER — Other Ambulatory Visit: Payer: Self-pay | Admitting: Internal Medicine

## 2023-01-05 DIAGNOSIS — Z1231 Encounter for screening mammogram for malignant neoplasm of breast: Secondary | ICD-10-CM

## 2023-01-22 DIAGNOSIS — H26491 Other secondary cataract, right eye: Secondary | ICD-10-CM | POA: Diagnosis not present

## 2023-01-22 DIAGNOSIS — E113293 Type 2 diabetes mellitus with mild nonproliferative diabetic retinopathy without macular edema, bilateral: Secondary | ICD-10-CM | POA: Diagnosis not present

## 2023-01-22 DIAGNOSIS — Z961 Presence of intraocular lens: Secondary | ICD-10-CM | POA: Diagnosis not present

## 2023-02-02 ENCOUNTER — Other Ambulatory Visit: Payer: Self-pay | Admitting: Neurosurgery

## 2023-02-02 NOTE — Telephone Encounter (Signed)
Spoke with the patient she advised she was no longer taking that medication,looks like it was just an automatic refill request from [employer].

## 2023-02-09 ENCOUNTER — Ambulatory Visit
Admission: RE | Admit: 2023-02-09 | Discharge: 2023-02-09 | Disposition: A | Discharge status: Home / Self Care | Payer: No Typology Code available for payment source | Source: Ambulatory Visit | Attending: Internal Medicine | Admitting: Internal Medicine

## 2023-02-09 DIAGNOSIS — Z1231 Encounter for screening mammogram for malignant neoplasm of breast: Secondary | ICD-10-CM | POA: Diagnosis not present

## 2023-02-15 ENCOUNTER — Other Ambulatory Visit: Payer: Self-pay | Admitting: Internal Medicine

## 2023-02-15 DIAGNOSIS — R928 Other abnormal and inconclusive findings on diagnostic imaging of breast: Secondary | ICD-10-CM

## 2023-02-17 ENCOUNTER — Ambulatory Visit
Admission: RE | Admit: 2023-02-17 | Discharge: 2023-02-17 | Disposition: A | Discharge status: Home / Self Care | Payer: No Typology Code available for payment source | Source: Ambulatory Visit | Attending: Internal Medicine | Admitting: Internal Medicine

## 2023-02-17 ENCOUNTER — Ambulatory Visit
Admission: RE | Admit: 2023-02-17 | Discharge: 2023-02-17 | Discharge status: Home / Self Care | Payer: No Typology Code available for payment source | Source: Ambulatory Visit | Attending: Internal Medicine | Admitting: Internal Medicine

## 2023-02-17 DIAGNOSIS — R928 Other abnormal and inconclusive findings on diagnostic imaging of breast: Secondary | ICD-10-CM | POA: Diagnosis not present

## 2023-02-17 DIAGNOSIS — R92321 Mammographic fibroglandular density, right breast: Secondary | ICD-10-CM | POA: Diagnosis not present

## 2023-03-01 DIAGNOSIS — I10 Essential (primary) hypertension: Secondary | ICD-10-CM | POA: Diagnosis not present

## 2023-03-01 DIAGNOSIS — E782 Mixed hyperlipidemia: Secondary | ICD-10-CM | POA: Diagnosis not present

## 2023-03-01 DIAGNOSIS — Z955 Presence of coronary angioplasty implant and graft: Secondary | ICD-10-CM | POA: Diagnosis not present

## 2023-03-01 DIAGNOSIS — E1142 Type 2 diabetes mellitus with diabetic polyneuropathy: Secondary | ICD-10-CM | POA: Diagnosis not present

## 2023-03-01 DIAGNOSIS — I350 Nonrheumatic aortic (valve) stenosis: Secondary | ICD-10-CM | POA: Diagnosis not present

## 2023-03-01 DIAGNOSIS — M48062 Spinal stenosis, lumbar region with neurogenic claudication: Secondary | ICD-10-CM | POA: Diagnosis not present

## 2023-03-01 DIAGNOSIS — N183 Chronic kidney disease, stage 3 unspecified: Secondary | ICD-10-CM | POA: Diagnosis not present

## 2023-03-01 DIAGNOSIS — I2510 Atherosclerotic heart disease of native coronary artery without angina pectoris: Secondary | ICD-10-CM | POA: Diagnosis not present

## 2023-03-01 DIAGNOSIS — Z952 Presence of prosthetic heart valve: Secondary | ICD-10-CM | POA: Diagnosis not present

## 2023-04-09 DIAGNOSIS — Z953 Presence of xenogenic heart valve: Secondary | ICD-10-CM | POA: Diagnosis not present

## 2023-04-09 DIAGNOSIS — E1142 Type 2 diabetes mellitus with diabetic polyneuropathy: Secondary | ICD-10-CM | POA: Diagnosis not present

## 2023-04-09 DIAGNOSIS — R1084 Generalized abdominal pain: Secondary | ICD-10-CM | POA: Diagnosis not present

## 2023-04-09 DIAGNOSIS — K921 Melena: Secondary | ICD-10-CM | POA: Diagnosis not present

## 2023-04-09 DIAGNOSIS — R195 Other fecal abnormalities: Secondary | ICD-10-CM | POA: Diagnosis not present

## 2023-04-09 DIAGNOSIS — Z8719 Personal history of other diseases of the digestive system: Secondary | ICD-10-CM | POA: Diagnosis not present

## 2023-04-09 DIAGNOSIS — R109 Unspecified abdominal pain: Secondary | ICD-10-CM | POA: Diagnosis not present

## 2023-04-09 DIAGNOSIS — I10 Essential (primary) hypertension: Secondary | ICD-10-CM | POA: Diagnosis not present

## 2023-04-16 ENCOUNTER — Ambulatory Visit: Payer: No Typology Code available for payment source | Admitting: Cardiovascular Disease

## 2023-05-04 DIAGNOSIS — E7800 Pure hypercholesterolemia, unspecified: Secondary | ICD-10-CM | POA: Diagnosis not present

## 2023-05-04 DIAGNOSIS — E1142 Type 2 diabetes mellitus with diabetic polyneuropathy: Secondary | ICD-10-CM | POA: Diagnosis not present

## 2023-05-04 DIAGNOSIS — D649 Anemia, unspecified: Secondary | ICD-10-CM | POA: Diagnosis not present

## 2023-05-04 DIAGNOSIS — N1831 Chronic kidney disease, stage 3a: Secondary | ICD-10-CM | POA: Diagnosis not present

## 2023-05-12 ENCOUNTER — Emergency Department: Payer: No Typology Code available for payment source

## 2023-05-12 ENCOUNTER — Inpatient Hospital Stay
Admission: EM | Admit: 2023-05-12 | Discharge: 2023-05-15 | DRG: 039 | Disposition: A | Discharge status: Home / Self Care | Payer: No Typology Code available for payment source | Attending: Internal Medicine | Admitting: Internal Medicine

## 2023-05-12 ENCOUNTER — Inpatient Hospital Stay: Payer: No Typology Code available for payment source

## 2023-05-12 ENCOUNTER — Other Ambulatory Visit: Payer: Self-pay

## 2023-05-12 ENCOUNTER — Encounter: Payer: Self-pay | Admitting: Emergency Medicine

## 2023-05-12 DIAGNOSIS — M47812 Spondylosis without myelopathy or radiculopathy, cervical region: Secondary | ICD-10-CM | POA: Diagnosis present

## 2023-05-12 DIAGNOSIS — Z955 Presence of coronary angioplasty implant and graft: Secondary | ICD-10-CM | POA: Diagnosis not present

## 2023-05-12 DIAGNOSIS — Z7984 Long term (current) use of oral hypoglycemic drugs: Secondary | ICD-10-CM

## 2023-05-12 DIAGNOSIS — Z888 Allergy status to other drugs, medicaments and biological substances status: Secondary | ICD-10-CM | POA: Diagnosis not present

## 2023-05-12 DIAGNOSIS — E1122 Type 2 diabetes mellitus with diabetic chronic kidney disease: Secondary | ICD-10-CM | POA: Diagnosis present

## 2023-05-12 DIAGNOSIS — Z96652 Presence of left artificial knee joint: Secondary | ICD-10-CM | POA: Diagnosis not present

## 2023-05-12 DIAGNOSIS — G459 Transient cerebral ischemic attack, unspecified: Principal | ICD-10-CM

## 2023-05-12 DIAGNOSIS — I10 Essential (primary) hypertension: Secondary | ICD-10-CM | POA: Diagnosis not present

## 2023-05-12 DIAGNOSIS — F17210 Nicotine dependence, cigarettes, uncomplicated: Secondary | ICD-10-CM

## 2023-05-12 DIAGNOSIS — I129 Hypertensive chronic kidney disease with stage 1 through stage 4 chronic kidney disease, or unspecified chronic kidney disease: Secondary | ICD-10-CM | POA: Diagnosis not present

## 2023-05-12 DIAGNOSIS — Z951 Presence of aortocoronary bypass graft: Secondary | ICD-10-CM

## 2023-05-12 DIAGNOSIS — Z952 Presence of prosthetic heart valve: Secondary | ICD-10-CM | POA: Diagnosis not present

## 2023-05-12 DIAGNOSIS — Z885 Allergy status to narcotic agent status: Secondary | ICD-10-CM

## 2023-05-12 DIAGNOSIS — K219 Gastro-esophageal reflux disease without esophagitis: Secondary | ICD-10-CM | POA: Diagnosis present

## 2023-05-12 DIAGNOSIS — Z7982 Long term (current) use of aspirin: Secondary | ICD-10-CM | POA: Diagnosis not present

## 2023-05-12 DIAGNOSIS — D631 Anemia in chronic kidney disease: Secondary | ICD-10-CM | POA: Diagnosis not present

## 2023-05-12 DIAGNOSIS — I639 Cerebral infarction, unspecified: Secondary | ICD-10-CM | POA: Diagnosis not present

## 2023-05-12 DIAGNOSIS — E1169 Type 2 diabetes mellitus with other specified complication: Secondary | ICD-10-CM | POA: Diagnosis not present

## 2023-05-12 DIAGNOSIS — I2510 Atherosclerotic heart disease of native coronary artery without angina pectoris: Secondary | ICD-10-CM | POA: Diagnosis present

## 2023-05-12 DIAGNOSIS — Z79899 Other long term (current) drug therapy: Secondary | ICD-10-CM | POA: Diagnosis not present

## 2023-05-12 DIAGNOSIS — Z720 Tobacco use: Secondary | ICD-10-CM

## 2023-05-12 DIAGNOSIS — I6523 Occlusion and stenosis of bilateral carotid arteries: Secondary | ICD-10-CM | POA: Diagnosis not present

## 2023-05-12 DIAGNOSIS — I6521 Occlusion and stenosis of right carotid artery: Secondary | ICD-10-CM | POA: Diagnosis not present

## 2023-05-12 DIAGNOSIS — E1142 Type 2 diabetes mellitus with diabetic polyneuropathy: Secondary | ICD-10-CM | POA: Diagnosis not present

## 2023-05-12 DIAGNOSIS — R202 Paresthesia of skin: Secondary | ICD-10-CM | POA: Diagnosis not present

## 2023-05-12 DIAGNOSIS — N1831 Chronic kidney disease, stage 3a: Secondary | ICD-10-CM | POA: Diagnosis not present

## 2023-05-12 DIAGNOSIS — Z8673 Personal history of transient ischemic attack (TIA), and cerebral infarction without residual deficits: Secondary | ICD-10-CM | POA: Diagnosis not present

## 2023-05-12 DIAGNOSIS — E785 Hyperlipidemia, unspecified: Secondary | ICD-10-CM | POA: Diagnosis not present

## 2023-05-12 DIAGNOSIS — R200 Anesthesia of skin: Secondary | ICD-10-CM | POA: Diagnosis not present

## 2023-05-12 DIAGNOSIS — Z7902 Long term (current) use of antithrombotics/antiplatelets: Secondary | ICD-10-CM

## 2023-05-12 DIAGNOSIS — F17200 Nicotine dependence, unspecified, uncomplicated: Secondary | ICD-10-CM | POA: Diagnosis not present

## 2023-05-12 DIAGNOSIS — G5603 Carpal tunnel syndrome, bilateral upper limbs: Secondary | ICD-10-CM | POA: Diagnosis not present

## 2023-05-12 DIAGNOSIS — R519 Headache, unspecified: Secondary | ICD-10-CM | POA: Diagnosis not present

## 2023-05-12 DIAGNOSIS — I6529 Occlusion and stenosis of unspecified carotid artery: Secondary | ICD-10-CM

## 2023-05-12 DIAGNOSIS — I350 Nonrheumatic aortic (valve) stenosis: Secondary | ICD-10-CM

## 2023-05-12 DIAGNOSIS — R29818 Other symptoms and signs involving the nervous system: Secondary | ICD-10-CM | POA: Diagnosis not present

## 2023-05-12 DIAGNOSIS — E041 Nontoxic single thyroid nodule: Secondary | ICD-10-CM | POA: Diagnosis not present

## 2023-05-12 LAB — CBG MONITORING, ED: Glucose-Capillary: 118 mg/dL — ABNORMAL HIGH (ref 70–99)

## 2023-05-12 LAB — URINALYSIS, ROUTINE W REFLEX MICROSCOPIC
Bilirubin Urine: NEGATIVE
Glucose, UA: NEGATIVE mg/dL
Hgb urine dipstick: NEGATIVE
Ketones, ur: NEGATIVE mg/dL
Leukocytes,Ua: NEGATIVE
Nitrite: NEGATIVE
Protein, ur: NEGATIVE mg/dL
Specific Gravity, Urine: 1.009 (ref 1.005–1.030)
pH: 5 (ref 5.0–8.0)

## 2023-05-12 LAB — CBC WITH DIFFERENTIAL/PLATELET
Abs Immature Granulocytes: 0.02 10*3/uL (ref 0.00–0.07)
Basophils Absolute: 0 10*3/uL (ref 0.0–0.1)
Basophils Relative: 1 %
Eosinophils Absolute: 0.1 10*3/uL (ref 0.0–0.5)
Eosinophils Relative: 2 %
HCT: 32.8 % — ABNORMAL LOW (ref 36.0–46.0)
Hemoglobin: 10.9 g/dL — ABNORMAL LOW (ref 12.0–15.0)
Immature Granulocytes: 0 %
Lymphocytes Relative: 29 %
Lymphs Abs: 1.5 10*3/uL (ref 0.7–4.0)
MCH: 35.4 pg — ABNORMAL HIGH (ref 26.0–34.0)
MCHC: 33.2 g/dL (ref 30.0–36.0)
MCV: 106.5 fL — ABNORMAL HIGH (ref 80.0–100.0)
Monocytes Absolute: 0.7 10*3/uL (ref 0.1–1.0)
Monocytes Relative: 14 %
Neutro Abs: 2.7 10*3/uL (ref 1.7–7.7)
Neutrophils Relative %: 54 %
Platelets: 148 10*3/uL — ABNORMAL LOW (ref 150–400)
RBC: 3.08 MIL/uL — ABNORMAL LOW (ref 3.87–5.11)
RDW: 14.7 % (ref 11.5–15.5)
WBC: 5 10*3/uL (ref 4.0–10.5)
nRBC: 0 % (ref 0.0–0.2)

## 2023-05-12 LAB — BASIC METABOLIC PANEL
Anion gap: 8 (ref 5–15)
BUN: 22 mg/dL (ref 8–23)
CO2: 22 mmol/L (ref 22–32)
Calcium: 9.3 mg/dL (ref 8.9–10.3)
Chloride: 108 mmol/L (ref 98–111)
Creatinine, Ser: 0.94 mg/dL (ref 0.44–1.00)
GFR, Estimated: >60 mL/min (ref >60)
Glucose, Bld: 121 mg/dL — ABNORMAL HIGH (ref 70–99)
Potassium: 3.8 mmol/L (ref 3.5–5.1)
Sodium: 138 mmol/L (ref 135–145)

## 2023-05-12 LAB — MAGNESIUM: Magnesium: 2.3 mg/dL (ref 1.7–2.4)

## 2023-05-12 MED ORDER — LABETALOL HCL 5 MG/ML IV SOLN: 10.0000 mg | INTRAVENOUS | Status: DC | PRN

## 2023-05-12 MED ORDER — IOHEXOL 350 MG/ML SOLN
75.0000 mL | Freq: Once | INTRAVENOUS | Status: AC | PRN
  Administered 2023-05-12: 75 mL via INTRAVENOUS

## 2023-05-12 MED ORDER — ENOXAPARIN SODIUM 40 MG/0.4ML IJ SOSY
40.0000 mg | PREFILLED_SYRINGE | INTRAMUSCULAR | Status: DC
  Administered 2023-05-12: 40 mg via SUBCUTANEOUS
  Filled 2023-05-12: qty 0.4

## 2023-05-12 MED ORDER — SODIUM CHLORIDE 0.9 % IV SOLN: INTRAVENOUS | Status: AC

## 2023-05-12 MED ORDER — HEPARIN (PORCINE) 25000 UT/250ML-% IV SOLN
1100.0000 [IU]/h | INTRAVENOUS | Status: DC
  Administered 2023-05-13: 1100 [IU]/h via INTRAVENOUS
  Filled 2023-05-12: qty 250

## 2023-05-12 MED ORDER — NICOTINE 21 MG/24HR TD PT24
21.0000 mg | MEDICATED_PATCH | Freq: Every day | TRANSDERMAL | Status: DC
  Administered 2023-05-15: 21 mg via TRANSDERMAL
  Filled 2023-05-12 (×2): qty 1

## 2023-05-12 MED ORDER — ACETAMINOPHEN 160 MG/5ML PO SOLN: 650.0000 mg | ORAL | Status: DC | PRN

## 2023-05-12 MED ORDER — GADOBUTROL 1 MMOL/ML IV SOLN
6.0000 mL | Freq: Once | INTRAVENOUS | Status: AC | PRN
  Administered 2023-05-12: 6 mL via INTRAVENOUS

## 2023-05-12 MED ORDER — STROKE: EARLY STAGES OF RECOVERY BOOK: Freq: Once | Status: DC

## 2023-05-12 MED ORDER — METOCLOPRAMIDE HCL 5 MG/ML IJ SOLN
10.0000 mg | Freq: Once | INTRAMUSCULAR | Status: AC
  Administered 2023-05-12: 10 mg via INTRAVENOUS
  Filled 2023-05-12: qty 2

## 2023-05-12 MED ORDER — SENNOSIDES-DOCUSATE SODIUM 8.6-50 MG PO TABS: 1.0000 | ORAL_TABLET | Freq: Every evening | ORAL | Status: DC | PRN

## 2023-05-12 MED ORDER — INSULIN ASPART 100 UNIT/ML IJ SOLN: 0.0000 [IU] | Freq: Three times a day (TID) | INTRAMUSCULAR | Status: DC

## 2023-05-12 MED ORDER — ACETAMINOPHEN 650 MG RE SUPP: 650.0000 mg | RECTAL | Status: DC | PRN

## 2023-05-12 MED ORDER — ACETAMINOPHEN 325 MG PO TABS: 650.0000 mg | ORAL_TABLET | ORAL | Status: DC | PRN

## 2023-05-12 MED ORDER — DIPHENHYDRAMINE HCL 50 MG/ML IJ SOLN
12.5000 mg | Freq: Once | INTRAMUSCULAR | Status: AC
  Administered 2023-05-12: 12.5 mg via INTRAVENOUS
  Filled 2023-05-12: qty 1

## 2023-05-12 NOTE — Assessment & Plan Note (Signed)
Transient left upper and lower extremity weakness since 1 AM-now resolved Baseline risk factors include tobacco abuse, age, hypertension, hyperlipidemia type 2 diabetes Noted 70% stenosis in the proximal left ICA and 50% stenosis in the proximal right ICA on imaging  Will plan for formal TIA eval including MRI brain, 2D ECHO, risk stratification labs  Cont asa  Formal neurology consultation if MRI indicative of acute pathology  Follow

## 2023-05-12 NOTE — Assessment & Plan Note (Signed)
Baseline CAD s/p CABGx1V: recent cath 09/2022 showing widely patent coronary arteries with no obstructive CAD and continued patency of the LIMA to LAD graft  Cont asa and statin

## 2023-05-12 NOTE — ED Provider Notes (Signed)
[facility] Provider Note    Event Date/Time   First MD Initiated Contact with Patient 05/12/23 [phone]     (approximate)   History   Numbness and Headache   HPI  Renee [name] is a 72 y.o. female with CKD, aortic valve status post TAVR procedure, coronary artery bypass who comes in with concerns for numbness, headache.  Patient reports that she went to bed and had a little bit of a headache last night.  She states that she woke up at 1:30 in the morning with worsening severe headache and some left-sided tingling.  She reports that the tingling in her leg is just from the knee down and she intermittently gets this after having her knee surgery.  She states that she was not really worried about that it was more of the headache that had worried her.  She states that the arm just feels little different but she denies any weakness, changes in speech or any other concerns.  She states that she did have a stressful thing that happened a few days ago and that she feels like she is just reacting to that.  She does report a mild headache at this time but not as bad as it was at 130.  She states that she had called EMS they came and evaluated her but at that time she had declined coming to the emergency room.  She reports that she when she went to bed it was 11:00 and at that time she had none of the tingling just a little bit of the head mild headache.   Physical Exam   Triage Vital Signs: ED Triage Vitals  Encounter Vitals Group     BP 05/12/23 0943 (!) 156/84     Systolic BP Percentile --      Diastolic BP Percentile --      Pulse Rate 05/12/23 0943 76     Resp 05/12/23 0943 18     Temp 05/12/23 0943 98.1 F (36.7 C)     Temp Source 05/12/23 0943 Oral     SpO2 05/12/23 0943 100 %     Weight 05/12/23 0942 150 lb (68 kg)     Height 05/12/23 0942 5\' 3"  (1.6 m)     Head Circumference --      Peak Flow --      Pain Score 05/12/23 0942 5     Pain Loc --      Pain  Education --      Exclude from Growth Chart --     Most recent vital signs: Vitals:   05/12/23 0943  BP: (!) 156/84  Pulse: 76  Resp: 18  Temp: 98.1 F (36.7 C)  SpO2: 100%     General: Awake, no distress.  CV:  Good peripheral perfusion.  Resp:  Normal effort.  Abd:  No distention.  Other:  NIH stroke scale is 1.  She reports some sensation changes on the left arm but otherwise neuro intact.  No  drift noted   ED Results / Procedures / Treatments   Labs (all labs ordered are listed, but only abnormal results are displayed) Labs Reviewed  CBC WITH DIFFERENTIAL/PLATELET - Abnormal; Notable for the following components:      Result Value   RBC 3.08 (*)    Hemoglobin 10.9 (*)    HCT 32.8 (*)    MCV 106.5 (*)    MCH 35.4 (*)    Platelets 148 (*)  All other components within normal limits  MAGNESIUM  URINALYSIS, ROUTINE W REFLEX MICROSCOPIC  BASIC METABOLIC PANEL     EKG  My interpretation of EKG:  Normal sinus rate of 76 without any ST elevation or T wave inversions, normal intervals  RADIOLOGY I have reviewed the CT head personally interpreted and no ICH   PROCEDURES:  Critical Care performed: No  Procedures   MEDICATIONS ORDERED IN ED: Medications  metoCLOPramide (REGLAN) injection 10 mg (10 mg Intravenous Given 05/12/23 1020)  diphenhydrAMINE (BENADRYL) injection 12.5 mg (12.5 mg Intravenous Given 05/12/23 1020)     IMPRESSION / MDM / ASSESSMENT AND PLAN / ED COURSE  I reviewed the triage vital signs and the nursing notes.   Patient's presentation is most consistent with acute presentation with potential threat to life or bodily function.     Stroke code was not called given out of the window for TNK with last known normal of 03/11/2023 at 11 PM.  She does not have signs of LVO based upon examination.  Will get CT angio to evaluate for any aneurysm given she reports severe headache waking her up from sleep and she is out of the window for CT  head being completely negative.  Will get labs to evaluate for any electrolyte abnormalities, AKI.  Will treat with a migraine cocktail in case this could be a complex migraine.  Labs show clear UA, cbc stable hemoglobin, BMP shows normal CR  12:37 PM d/w vascular will see pt if inpatient nad depending on MRI results may need carotid intervention. Dr Gilda Crease in OR so discussed case with [facility].  D/w patient. Symptoms have resolved.  She is willing to be admitted for TIA workup.   The patient is on the cardiac monitor to evaluate for evidence of arrhythmia and/or significant heart rate changes.      FINAL CLINICAL IMPRESSION(S) / ED DIAGNOSES   Final diagnoses:  TIA (transient ischemic attack)  Internal carotid artery stenosis, bilateral     Rx / DC Orders   ED Discharge Orders     None        Note:  This document was prepared using Dragon voice recognition software and may include unintentional dictation errors.   Concha Se, MD 05/12/23 [phone]

## 2023-05-12 NOTE — Assessment & Plan Note (Signed)
SSI  A1C  

## 2023-05-12 NOTE — ED Triage Notes (Signed)
First Nurse Note;  Pt via POV from [facility]. Pt woke up around 0100 this AM, with L sided arm and leg numbness and headache. Denies hx of stroke. Pt is A&Ox4 and NAD, ambulatory to triage.

## 2023-05-12 NOTE — ED Notes (Signed)
Pt to MRI again.

## 2023-05-12 NOTE — ED Notes (Signed)
Called MRI

## 2023-05-12 NOTE — ED Notes (Signed)
ED Provider at bedside. 

## 2023-05-12 NOTE — ED Notes (Signed)
Patient in MRI 

## 2023-05-12 NOTE — H&P (Signed)
History and Physical    Patient: Renee [name] UJW:[id number] DOB: 03/09/1951 DOA: 05/12/2023 DOS: the patient was seen and examined on 05/12/2023 PCP: Lynnea Ferrier, MD  Patient coming from: Home  Chief Complaint:  Chief Complaint  Patient presents with   Numbness   Headache   HPI: Renee [name] is a 72 y.o. female with medical history significant of stage III CKD, CAD status post stenting, aortic stenosis status post TAVR, hypertension, type 2 diabetes presenting with TIA.  Patient reports having worsening headache around 1 AM.  Headache persisted with progression into left upper extremity weakness.  Also with left lower extremity weakness which is fairly chronic with patient.  Denies any prior episodes like this in the past.  No chest pain or shortness of breath.  No nausea or vomiting.  1/2 pack/day smoker.  Has been compliant with aspirin at home.  Follows Dr. Juliann Pares with [facility] cardiology for cardiac issues.  No reported syncope, fall. Presented to the ER afebrile, hemodynamically stable.  Satting well on room air.  White count 5, hemoglobin 11, platelets 148, urinalysis nonnegative infection, creatinine 0.94 with GFR greater than 60. CTA head and neck w/Greater than 70% stenosis in the proximal left ICA and 50% stenosis in the proximal right ICA.   Review of Systems: As mentioned in the history of present illness. All other systems reviewed and are negative. 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 Excell Seltzer & Dr. Laneta Simmers   Severe aortic stenosis    Past Surgical History:  Procedure Laterality Date   ABDOMINAL HYSTERECTOMY     BREAST EXCISIONAL BIOPSY Right 20+  yrs ago   benign   CARDIAC SURGERY     cabg   CARPAL TUNNEL RELEASE Bilateral    COLONOSCOPY N/A 02/24/2017   Procedure: COLONOSCOPY;  Surgeon: Toledo, Boykin Nearing, MD;  Location: [facility] ENDOSCOPY;  Service: Endoscopy;  Laterality: N/A;   COLONOSCOPY N/A 07/28/2022   Procedure: COLONOSCOPY;  Surgeon: Toledo, Boykin Nearing, MD;  Location: [facility] ENDOSCOPY;  Service: Gastroenterology;  Laterality: N/A;   CORONARY ARTERY BYPASS GRAFT     1996   CORONARY/GRAFT ANGIOGRAPHY N/A 09/15/2022   Procedure: CORONARY/GRAFT ANGIOGRAPHY;  Surgeon: Tonny Bollman, MD;  Location[facility] INVASIVE CV LAB;  Service: Cardiovascular;  Laterality: N/A;   ESOPHAGOGASTRODUODENOSCOPY (EGD) WITH PROPOFOL N/A 07/27/2022   Procedure: ESOPHAGOGASTRODUODENOSCOPY (EGD) WITH PROPOFOL;  Surgeon: Toledo, Boykin Nearing, MD;  Location: [facility] ENDOSCOPY;  Service: Gastroenterology;  Laterality: N/A;   HERNIA REPAIR     umbilical   INTRAOPERATIVE TRANSTHORACIC ECHOCARDIOGRAM N/A 10/13/2022   Procedure: INTRAOPERATIVE TRANSTHORACIC ECHOCARDIOGRAM;  Surgeon: Tonny Bollman, MD;  Location: [facility] INVASIVE CV LAB;  Service: Open Heart Surgery;  Laterality: N/A;   LUMBAR LAMINECTOMY/DECOMPRESSION MICRODISCECTOMY N/A 06/25/2021   Procedure: L2-3 DECOMPRESSION;  Surgeon: Venetia Night, MD;  Location: [facility] ORS;  Service: Neurosurgery;  Laterality: N/A;   TOTAL KNEE ARTHROPLASTY Left 10/25/2018   Procedure: TOTAL KNEE ARTHROPLASTY - LEFT - DIABETIC;  Surgeon: Kennedy Bucker, MD;  Location: [facility] ORS;  Service: Orthopedics;  Laterality: Left;   TRANSCATHETER AORTIC VALVE REPLACEMENT, TRANSFEMORAL Right 10/13/2022   Procedure:  Transcatheter Aortic Valve Replacement, Transfemoral;  Surgeon: Tonny Bollman, MD;  Location: [facility] INVASIVE CV LAB;  Service: Open Heart Surgery;  Laterality: Right;   Social History:  reports that she has been smoking cigarettes. She has a 8.8 pack-year smoking history. She has never used smokeless tobacco. She reports current alcohol use. She  reports that she does not use drugs.  Allergies  Allergen Reactions   Tizanidine     headaches   Venlafaxine Other (See Comments)    intolerant   Codeine Nausea And Vomiting   Tramadol Nausea And Vomiting    Family History  Problem Relation Age of Onset   Breast cancer Neg Hx     Prior to Admission medications   Medication Sig Start Date End Date Taking? Authorizing Provider  ACCU-CHEK GUIDE test strip USE TO TEST SUGAR DAILY 08/05/22   [provider]  Accu-Chek Softclix Lancets lancets daily. 08/05/22   [provider]  Alpha-Lipoic Acid 600 MG CAPS Take 600 mg by mouth daily.    [provider]  amLODipine (NORVASC) 5 MG tablet Take 5 mg by mouth daily.    [provider]  amoxicillin (AMOXIL) 500 MG tablet Take 4 tablets (2,000 mg total) by mouth as directed. 1 hour prior to dental work including cleanings 10/23/22   Janetta Hora, PA-C  aspirin EC 81 MG tablet Take 81 mg by mouth daily. Swallow whole.    [provider]  Azelastine HCl 137 MCG/SPRAY SOLN Place 1 spray into the nose 2 (two) times daily as needed (allergies). 05/29/21   [provider]  Blood Glucose Monitoring Suppl (ACCU-CHEK GUIDE ME) w/Device KIT USE AS DIRECTED TO TEST SUGAR DAILY 08/05/22   [provider]  carvedilol (COREG) 6.25 MG tablet Take 6.25 mg by mouth 2 (two) times a day.     [provider]  Cholecalciferol (VITAMIN D3) 50 MCG (2000 UT) TABS Take 2,000 Units by mouth daily.    [provider]  CINNAMON PO Take 1,000 mg by mouth daily.    [provider]  [employer]S SENNA 8.6 MG tablet TAKE 1 TABLET (8.6 MG TOTAL) BY MOUTH DAILY AS NEEDED FOR MILD CONSTIPATION. 06/25/22   Susanne Borders, PA  EPINEPHrine 0.3 mg/0.3 mL IJ SOAJ injection Inject 0.3 mg into the muscle as needed for anaphylaxis. 05/04/22   [provider]  ferrous sulfate ER (SLOW FE) 142 (45 Fe) MG TBCR tablet Take 45 mg by mouth every  Monday, Wednesday, and Friday.    [provider]  furosemide (LASIX) 20 MG tablet Take 20 mg by mouth once a week. In the morning    [provider]  gabapentin (NEURONTIN) 800 MG tablet Take 800-1,600 mg by mouth See admin instructions. Take 1 tablet (800 mg) by mouth in the morning, 1 tablet (800 mg) by mouth at noon, & take 2 tablets (1600 mg) by mouth at bedtime.    [provider]  loratadine (CLARITIN) 10 MG tablet Take 10 mg by mouth daily as needed for allergies.    [provider]  metFORMIN (GLUCOPHAGE-XR) 500 MG 24 hr tablet Take 500 mg by mouth daily with breakfast. 03/28/21   [provider]  nitroGLYCERIN (NITROSTAT) 0.4 MG SL tablet Place 0.4 mg under the tongue every 5 (five) minutes as needed for chest pain.    [provider]  Omega-3 Fatty Acids (FISH OIL) 1000 MG CAPS Take 1,000 mg by mouth 3 (three) times daily.  [provider]  potassium chloride SA (KLOR-CON M) 20 MEQ tablet Take 20 mEq by mouth daily. 06/22/22   [provider]  Propylene Glycol (SYSTANE BALANCE) 0.6 % SOLN Place 1 drop into both eyes daily.    [provider]  rosuvastatin (CRESTOR) 20 MG tablet Take 20 mg by mouth at bedtime.    [provider]  triamcinolone cream (KENALOG) 0.5 % Apply 1 Application topically daily as needed (irritation).    [provider]    Physical Exam: Vitals:   05/12/23 0942 05/12/23 0943 05/12/23 1135  BP:  (!) 156/84 126/69  Pulse:  76 78  Resp:  18 20  Temp:  98.1 F (36.7 C)   TempSrc:  Oral   SpO2:  100% 100%  Weight: 68 kg    Height: 5\' 3"  (1.6 m)     Physical Exam Constitutional:      Appearance: She is obese.  HENT:     Head: Normocephalic and atraumatic.     Nose: Nose normal.     Mouth/Throat:     Mouth: Mucous membranes are moist.  Eyes:     Pupils: Pupils are equal, round, and reactive to light.  Cardiovascular:     Rate and Rhythm: Normal rate and  regular rhythm.  Pulmonary:     Effort: Pulmonary effort is normal.  Abdominal:     General: Bowel sounds are normal.  Musculoskeletal:        General: Normal range of motion.  Skin:    General: Skin is warm.  Neurological:     General: No focal deficit present.  Psychiatric:        Mood and Affect: Mood normal.     Data Reviewed:  There are no new results to review at this time.  CT ANGIO HEAD NECK W WO CM CLINICAL DATA:  Left-sided arm and leg numbness and tingling; headache  EXAM: CT ANGIOGRAPHY HEAD AND NECK WITH AND WITHOUT CONTRAST  TECHNIQUE: Multidetector CT imaging of the head and neck was performed using the standard protocol during bolus administration of intravenous contrast. Multiplanar CT image reconstructions and MIPs were obtained to evaluate the vascular anatomy. Carotid stenosis measurements (when applicable) are obtained utilizing NASCET criteria, using the distal internal carotid diameter as the denominator.  RADIATION DOSE REDUCTION: This exam was performed according to the departmental dose-optimization program which includes automated exposure control, adjustment of the mA and/or kV according to patient size and/or use of iterative reconstruction technique.  CONTRAST:  75mL OMNIPAQUE IOHEXOL 350 MG/ML SOLN  COMPARISON:  No prior CTA available, correlation is made with CT head 10/31/2021  FINDINGS: CT HEAD FINDINGS  Brain: No evidence of acute infarct, hemorrhage, mass, mass effect, or midline shift. No hydrocephalus or extra-axial fluid collection. Cavum septum pellucidum et vergae. Periventricular white matter changes, likely the sequela of chronic small vessel ischemic disease. Normal cerebral volume for age.  Vascular: No hyperdense vessel.  Skull: Negative for fracture or focal lesion.  Sinuses/Orbits: No acute finding.  Other: The mastoid air cells are well aerated.  CTA NECK FINDINGS  Aortic arch: Standard branching. Imaged  portion shows no evidence of aneurysm or dissection. No significant stenosis of the major arch vessel origins. Aortic atherosclerosis  Right carotid system: 50% stenosis in the proximal right ICA (series 10, image 208), primarily secondary to calcified plaque. No evidence of dissection.  Left carotid system: Greater than 70% stenosis in the proximal left ICA (series 10, image 205), primarily secondary to calcified  plaque. No evidence of dissection.  Vertebral arteries: No evidence of dissection, occlusion, or hemodynamically significant stenosis (greater than 50%). The right vertebral artery is diminutive throughout its course  Skeleton: No acute osseous abnormality. Degenerative changes in the cervical spine. Status post median sternotomy.  Other neck: 1.3 cm hypoenhancing nodule in the left thyroid lobe, for which no follow-up is currently indicated. (Reference: J Am Coll Radiol. 2015 Feb;12(2): 143-50) with calcifications but  Upper chest: No focal pulmonary opacity or pleural effusion.  Review of the MIP images confirms the above findings  CTA HEAD FINDINGS  Anterior circulation: Both internal carotid arteries are patent to the termini, without significant stenosis.  A1 segments patent, with severe stenosis distal right A1 (series 10, image 110). Normal anterior communicating artery. Anterior cerebral arteries are patent to their distal aspects without significant stenosis.  No M1 stenosis or occlusion. MCA branches perfused to their distal aspects without significant stenosis.  Posterior circulation: The right vertebral artery is diminutive and is not well seen at the vertebrobasilar junction. The left vertebral artery patent to the vertebrobasilar junction without significant stenosis. The left PICA is patent. The right PICA is not definitively seen.  Basilar patent to its distal aspect without significant stenosis. Superior cerebellar arteries patent  proximally.  Patent, diminutive P1 segments. Near fetal origin of the bilateral PCAs from the posterior communicating arteries. PCAs perfused to their distal aspects without significant stenosis.  Venous sinuses: As permitted by contrast timing, patent.  Anatomic variants: Near fetal origin of the bilateral PCAs.  No evidence of aneurysm or vascular malformation.  Review of the MIP images confirms the above findings  IMPRESSION: 1. No acute intracranial process. 2. Greater than 70% stenosis in the proximal left ICA and 50% stenosis in the proximal right ICA. 3. Severe stenosis in the distal right A1. 4. Aortic atherosclerosis.  Aortic Atherosclerosis (ICD10-I70.0).  Electronically Signed   By: Wiliam Ke M.D.   On: 05/12/2023 12:00  Lab Results  Component Value Date   WBC 5.0 05/12/2023   HGB 10.9 (L) 05/12/2023   HCT 32.8 (L) 05/12/2023   MCV 106.5 (H) 05/12/2023   PLT 148 (L) 05/12/2023   Last metabolic panel Lab Results  Component Value Date   GLUCOSE 121 (H) 05/12/2023   NA 138 05/12/2023   K 3.8 05/12/2023   CL 108 05/12/2023   CO2 22 05/12/2023   BUN 22 05/12/2023   CREATININE 0.94 05/12/2023   GFRNONAA >60 05/12/2023   CALCIUM 9.3 05/12/2023   PHOS 4.8 (H) 07/26/2022   PROT 7.0 10/09/2022   ALBUMIN 3.7 10/09/2022   BILITOT 0.2 (L) 10/09/2022   ALKPHOS 64 10/09/2022   AST 21 10/09/2022   ALT 20 10/09/2022   ANIONGAP 8 05/12/2023    Assessment and Plan: TIA (transient ischemic attack) Transient left upper and lower extremity weakness since 1 AM-now resolved Baseline risk factors include tobacco abuse, age, hypertension, hyperlipidemia type 2 diabetes Noted 70% stenosis in the proximal left ICA and 50% stenosis in the proximal right ICA on imaging  Will plan for formal TIA eval including MRI brain, 2D ECHO, risk stratification labs  Cont asa  Formal neurology consultation if MRI indicative of acute pathology  Follow    Carotid stenosis Noted  greater than 70% stenosis in the proximal left ICA and 50% stenosis in the proximal right ICA on imaging  Per Dr. Fuller Plan, case discussed w/ on call vascular surgery  Tentative plan for intervention pending imaging studies including MRI brain  Follow up recommendations    Stage 3a chronic kidney disease (CKD) (HCC) Cr around 1 w/ FGR>60  Monitor    Type 2 diabetes mellitus with diabetic polyneuropathy, without long-term current use of insulin (HCC) SSI  A1C    Essential hypertension Allow for permissive HTN in setting of TIA eval  Prn IV labetalol for SBP>220 or DBP>110    Tobacco abuse 1/2 PPD smoker  Discussed cessation  Nicotine patch    S/P TAVR (transcatheter aortic valve replacement) Baseline Aortic stenosis, moderate to severe. 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 Severely thickened and severely calcified aortic valve cusps.  Cont ASA and statin    Hx of CABG Baseline CAD s/p CABGx1V: recent cath 09/2022 showing widely patent coronary arteries with no obstructive CAD and continued patency of the LIMA to LAD graft  Cont asa and statin        Advance Care Planning:   Code Status: Full Code   Consults: Vascular surgery, neurology as clinically appropriate   Family Communication: no family at the bedside   Severity of Illness: The appropriate patient status for this patient is INPATIENT. Inpatient status is judged to be reasonable and necessary in order to provide the required intensity of service to ensure the patient's safety. The patient's presenting symptoms, physical exam findings, and initial radiographic and laboratory data in the context of their chronic comorbidities is felt to place them at high risk for further clinical deterioration. Furthermore, it is not anticipated that the patient will be medically stable for discharge from the hospital within 2 midnights of admission.   * I  certify that at the point of admission it is my clinical judgment that the patient will require inpatient hospital care spanning beyond 2 midnights from the point of admission due to high intensity of service, high risk for further deterioration and high frequency of surveillance required.*  Author: Floydene Flock, MD 05/12/2023 1:29 PM  For on call review [email or web address].

## 2023-05-12 NOTE — ED Notes (Signed)
EDP at bedside performed [facility]

## 2023-05-12 NOTE — ED Notes (Signed)
Pt returned from MRI °

## 2023-05-12 NOTE — Assessment & Plan Note (Signed)
Baseline Aortic stenosis, moderate to severe. 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 Severely thickened and severely calcified aortic valve cusps.  Cont ASA and statin

## 2023-05-12 NOTE — Consult Note (Signed)
Hospital Consult    Reason for Consult:  TIA with Bilateral Carotid Stenosis Requesting Physician:  Dr Doree Albee MD MRN #:  [id number]  History of Present Illness: This is a 72 y.o. female who presents to [facility] emergency department with "not feeling right" and having numbness and tingling down both upper extremities.   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 Excell Seltzer & Dr. Laneta Simmers   Severe aortic stenosis     Past Surgical History:  Procedure Laterality Date   ABDOMINAL HYSTERECTOMY     BREAST EXCISIONAL BIOPSY Right 20+ yrs ago   benign   CARDIAC SURGERY     cabg   CARPAL TUNNEL RELEASE Bilateral    COLONOSCOPY N/A 02/24/2017   Procedure: COLONOSCOPY;  Surgeon: Toledo, Boykin Nearing, MD;  Location: [facility] ENDOSCOPY;  Service: Endoscopy;  Laterality: N/A;   COLONOSCOPY N/A 07/28/2022   Procedure: COLONOSCOPY;  Surgeon: Toledo, Boykin Nearing, MD;  Location: [facility] ENDOSCOPY;  Service: Gastroenterology;  Laterality: N/A;   CORONARY ARTERY BYPASS GRAFT     1996   CORONARY/GRAFT ANGIOGRAPHY N/A 09/15/2022   Procedure: CORONARY/GRAFT ANGIOGRAPHY;  Surgeon: Tonny Bollman, MD;  Location: [facility] INVASIVE CV LAB;  Service: Cardiovascular;  Laterality: N/A;   ESOPHAGOGASTRODUODENOSCOPY (EGD) WITH PROPOFOL N/A 07/27/2022   Procedure: ESOPHAGOGASTRODUODENOSCOPY (EGD) WITH PROPOFOL;  Surgeon: Toledo, Boykin Nearing, MD;  Location: [facility] ENDOSCOPY;  Service: Gastroenterology;  Laterality: N/A;   HERNIA REPAIR     umbilical   INTRAOPERATIVE TRANSTHORACIC ECHOCARDIOGRAM N/A 10/13/2022   Procedure: INTRAOPERATIVE TRANSTHORACIC ECHOCARDIOGRAM;  Surgeon: Tonny Bollman, MD;  Location: [facility] INVASIVE CV  LAB;  Service: Open Heart Surgery;  Laterality: N/A;   LUMBAR LAMINECTOMY/DECOMPRESSION MICRODISCECTOMY N/A 06/25/2021   Procedure: L2-3 DECOMPRESSION;  Surgeon: Venetia Night, MD;  Location: [facility] ORS;  Service: Neurosurgery;  Laterality: N/A;   TOTAL KNEE ARTHROPLASTY Left 10/25/2018   Procedure: TOTAL KNEE ARTHROPLASTY - LEFT - DIABETIC;  Surgeon: Kennedy Bucker, MD;  Location: [facility] ORS;  Service: Orthopedics;  Laterality: Left;   TRANSCATHETER AORTIC VALVE REPLACEMENT, TRANSFEMORAL Right 10/13/2022   Procedure: Transcatheter Aortic Valve Replacement, Transfemoral;  Surgeon: Tonny Bollman, MD;  Location: [facility] INVASIVE CV LAB;  Service: Open Heart Surgery;  Laterality: Right;    Allergies  Allergen Reactions   Tizanidine     headaches   Venlafaxine Other (See Comments)    intolerant   Codeine Nausea And Vomiting   Tramadol Nausea And Vomiting    Prior to Admission medications   Medication Sig Start Date End Date Taking? Authorizing Provider  acetaminophen (TYLENOL) 500 MG tablet Take 500 mg by mouth every 6 (six) hours as needed for headache or fever.   Yes [provider]  amLODipine (NORVASC) 5 MG tablet Take 5 mg by mouth daily.   Yes [provider]  aspirin EC 81 MG tablet Take 81 mg by mouth daily. Swallow whole.   Yes [provider]  carvedilol (COREG) 6.25 MG tablet Take 6.25 mg by mouth 2 (two) times a day.    Yes [provider]  Cholecalciferol (VITAMIN D3) 50 MCG (2000 UT) TABS Take 2,000 Units by mouth daily.  Yes [provider]  furosemide (LASIX) 20 MG tablet Take 20 mg by mouth once a week. In the morning   Yes [provider]  gabapentin (NEURONTIN) 800 MG tablet Take 800-1,600 mg by mouth See admin instructions. Take 1 tablet (800 mg) by mouth in the morning, 1 tablet (800 mg) by mouth at noon, & take 2 tablets (1600 mg) by mouth at bedtime.   Yes [provider]  potassium chloride SA (KLOR-CON M) 20 MEQ  tablet Take 20 mEq by mouth daily. 06/22/22  Yes [provider]  Propylene Glycol (SYSTANE BALANCE) 0.6 % SOLN Place 1 drop into both eyes daily.   Yes [provider]  rosuvastatin (CRESTOR) 20 MG tablet Take 20 mg by mouth at bedtime.   Yes [provider]  ACCU-CHEK GUIDE test strip USE TO TEST SUGAR DAILY 08/05/22   [provider]  Accu-Chek Softclix Lancets lancets daily. 08/05/22   [provider]  Alpha-Lipoic Acid 600 MG CAPS Take 600 mg by mouth daily.    [provider]  amoxicillin (AMOXIL) 500 MG tablet Take 4 tablets (2,000 mg total) by mouth as directed. 1 hour prior to dental work including cleanings 10/23/22   Janetta Hora, PA-C  Azelastine HCl 137 MCG/SPRAY SOLN Place 1 spray into the nose 2 (two) times daily as needed (allergies). 05/29/21   [provider]  Blood Glucose Monitoring Suppl (ACCU-CHEK GUIDE ME) w/Device KIT USE AS DIRECTED TO TEST SUGAR DAILY 08/05/22   [provider]  CINNAMON PO Take 1,000 mg by mouth daily.    [provider]  [employer] SENNA 8.6 MG tablet TAKE 1 TABLET (8.6 MG TOTAL) BY MOUTH DAILY AS NEEDED FOR MILD CONSTIPATION. 06/25/22   Susanne Borders, PA  EPINEPHrine 0.3 mg/0.3 mL IJ SOAJ injection Inject 0.3 mg into the muscle as needed for anaphylaxis. 05/04/22   [provider]  ferrous sulfate ER (SLOW FE) 142 (45 Fe) MG TBCR tablet Take 45 mg by mouth every Monday, Wednesday, and Friday. Patient not taking: Reported on 05/12/2023    [provider]  loratadine (CLARITIN) 10 MG tablet Take 10 mg by mouth daily as needed for allergies.    [provider]  metFORMIN (GLUCOPHAGE-XR) 500 MG 24 hr tablet Take 500 mg by mouth daily with breakfast. Patient not taking: Reported on 05/12/2023 03/28/21   [provider]  nitroGLYCERIN (NITROSTAT) 0.4 MG SL tablet Place 0.4 mg under the tongue every 5 (five) minutes as needed for chest pain.     [provider]  Omega-3 Fatty Acids (FISH OIL) 1000 MG CAPS Take 1,000 mg by mouth 3 (three) times daily.    [provider]  triamcinolone cream (KENALOG) 0.5 % Apply 1 Application topically daily as needed (irritation).    [provider]    Social History   Socioeconomic History   Marital status: Widowed    Spouse name: Not on file   Number of children: Not on file   Years of education: Not on file   Highest education level: Not on file  Occupational History   Not on file  Tobacco Use   Smoking status: Every Day    Current packs/day: 0.25    Average packs/day: 0.3 packs/day for 35.0 years (8.8 ttl pk-yrs)    Types: Cigarettes   Smokeless tobacco: Never  Vaping Use   Vaping status: Never Used  Substance and Sexual Activity   Alcohol use: Yes   Drug use:  No   Sexual activity: Not on file  Other Topics Concern   Not on file  Social History Narrative   Son lives with her. Spouse died May 17, 2019   Social Determinants of Health   Financial Resource Strain: Low Risk  (11/05/2022)   Received from [facility], [facility]   Overall Financial Resource Strain (CARDIA)    Difficulty of Paying Living Expenses: Not hard at all  Food Insecurity: No Food Insecurity (11/05/2022)   Received from [facility], [facility]   Hunger Vital Sign    Worried About Running Out of Food in the Last Year: Never true    Ran Out of Food in the Last Year: Never true  Transportation Needs: No Transportation Needs (11/05/2022)   Received from [facility], [facility]   [employer] - Transportation    In the past 12 months, has lack of transportation kept you from medical appointments or from getting medications?: No    Lack of Transportation (Non-Medical): No  Physical Activity: Not on file  Stress: Not on file  Social Connections: Not on file  Intimate Partner Violence:  Not At Risk (07/26/2022)   Humiliation, Afraid, Rape, and Kick questionnaire    Fear of Current or Ex-Partner: No    Emotionally Abused: No    Physically Abused: No    Sexually Abused: No     Family History  Problem Relation Age of Onset   Breast cancer Neg Hx     ROS: Otherwise negative unless mentioned in HPI  Physical Examination  Vitals:   05/12/23 1400 05/12/23 1435  BP:  108/78  Pulse:  66  Resp:  18  Temp: 98.1 F (36.7 C)   SpO2:  100%   Body mass index is 26.57 kg/m.  General:  WDWN in NAD Gait: Not observed HENT: WNL, normocephalic Pulmonary: normal non-labored breathing, without Rales, rhonchi,  wheezing Cardiac: regular, without  Murmurs, rubs or gallops; without carotid bruits Abdomen: Positive bowel sounds throughout, soft, NT/ND, no masses Skin: without rashes Vascular Exam/Pulses: Palpable pulses throughout. Extremities: without ischemic changes, without Gangrene , without cellulitis; without open wounds;  Musculoskeletal: no muscle wasting or atrophy  Neurologic: A&O X 3;  No focal weakness or paresthesias are detected; speech is fluent/normal Psychiatric:  The pt has Normal affect. Lymph:  Unremarkable  CBC    Component Value Date/Time   WBC 5.0 05/12/2023 1007   RBC 3.08 (L) 05/12/2023 1007   HGB 10.9 (L) 05/12/2023 1007   HGB 10.6 (L) 09/08/2022 1245   HCT 32.8 (L) 05/12/2023 1007   HCT 33.9 (L) 09/08/2022 1245   PLT 148 (L) 05/12/2023 1007   PLT 232 09/08/2022 1245   MCV 106.5 (H) 05/12/2023 1007   MCV 96 09/08/2022 1245   MCV 91 10/22/2011 0340   MCH 35.4 (H) 05/12/2023 1007   MCHC 33.2 05/12/2023 1007   RDW 14.7 05/12/2023 1007   RDW 17.0 (H) 09/08/2022 1245   RDW 14.1 10/22/2011 0340   LYMPHSABS 1.5 05/12/2023 1007   LYMPHSABS 2.1 10/22/2011 0340   MONOABS 0.7 05/12/2023 1007   MONOABS 0.9 10/22/2011 0340   EOSABS 0.1 05/12/2023 1007   EOSABS 0.0 10/22/2011 0340   BASOSABS 0.0 05/12/2023 1007   BASOSABS 0.0 10/22/2011 0340     BMET    Component Value Date/Time   NA 138 05/12/2023 1007   NA 145 (H) 09/08/2022 1245   NA 141 10/22/2011 0340  K 3.8 05/12/2023 1007   K 3.7 02/03/2012 1219   CL 108 05/12/2023 1007   CL 108 (H) 10/22/2011 0340   CO2 22 05/12/2023 1007   CO2 25 10/22/2011 0340   GLUCOSE 121 (H) 05/12/2023 1007   GLUCOSE 85 10/22/2011 0340   BUN 22 05/12/2023 1007   BUN 21 09/08/2022 1245   BUN 30 (H) 10/22/2011 0340   CREATININE 0.94 05/12/2023 1007   CREATININE 1.38 (H) 10/22/2011 0340   CALCIUM 9.3 05/12/2023 1007   CALCIUM 8.1 (L) 10/22/2011 0340   GFRNONAA >60 05/12/2023 1007   GFRNONAA 41 (L) 10/22/2011 0340   GFRAA >60 10/28/2018 0445   GFRAA 47 (L) 10/22/2011 0340    COAGS: Lab Results  Component Value Date   INR 1.2 10/09/2022   INR 1.2 07/26/2022   INR 1.1 06/17/2021     Non-Invasive Vascular Imaging:   MRI of the Head ordered but not completed at this time.   Statin:  Yes.   Beta Blocker:  Yes.   Aspirin:  Yes.   ACEI:  No. ARB:  No. CCB use:  Yes Other antiplatelets/anticoagulants:  No.    ASSESSMENT/PLAN: This is a 72 y.o. female ***   -I discussed in detail the plan with Dr Vilinda Flake MD and he agrees with the plan.    [name] 05/12/2023 4:27 PM

## 2023-05-12 NOTE — Assessment & Plan Note (Signed)
Allow for permissive HTN in setting of TIA eval  Prn IV labetalol for SBP>220 or DBP>110

## 2023-05-12 NOTE — Assessment & Plan Note (Signed)
Cr around 1 w/ FGR>60  Monitor

## 2023-05-12 NOTE — Assessment & Plan Note (Signed)
Noted greater than 70% stenosis in the proximal left ICA and 50% stenosis in the proximal right ICA on imaging  Per Dr. Fuller Plan, case discussed w/ on call vascular surgery  Tentative plan for intervention pending imaging studies including MRI brain  Follow up recommendations

## 2023-05-12 NOTE — Assessment & Plan Note (Signed)
1/2 PPD smoker  Discussed cessation  Nicotine patch

## 2023-05-12 NOTE — ED Notes (Signed)
Provided slip resistant socks

## 2023-05-12 NOTE — ED Triage Notes (Signed)
Pt via POV from home. Pt c/o intermittent headache started last night and pt woke up with L arm and leg numbness. LKW 1100 last night. Denies any pain. Pt has a hx of HTN and has been fluctuating. Pt is A&Ox4 and NAD

## 2023-05-13 ENCOUNTER — Inpatient Hospital Stay (HOSPITAL_COMMUNITY)
Admit: 2023-05-13 | Discharge: 2023-05-13 | Disposition: A | Discharge status: Home / Self Care | Payer: No Typology Code available for payment source | Attending: Family Medicine | Admitting: Family Medicine

## 2023-05-13 DIAGNOSIS — I6523 Occlusion and stenosis of bilateral carotid arteries: Principal | ICD-10-CM

## 2023-05-13 DIAGNOSIS — G459 Transient cerebral ischemic attack, unspecified: Secondary | ICD-10-CM

## 2023-05-13 LAB — HEPARIN LEVEL (UNFRACTIONATED)
Heparin Unfractionated: >1.1 [IU]/mL — ABNORMAL HIGH (ref 0.30–0.70)
Heparin Unfractionated: 0.47 [IU]/mL (ref 0.30–0.70)

## 2023-05-13 LAB — ECHOCARDIOGRAM COMPLETE
AR max vel: 2.34 cm2
AV Area VTI: 3.18 cm2
AV Area mean vel: 2.5 cm2
AV Mean grad: 5 mm[Hg]
AV Peak grad: 9.5 mm[Hg]
Ao pk vel: 1.55 m/s
Area-P 1/2: 2.8 cm2
Height: 63 in
MV VTI: 2.67 cm2
S' Lateral: 2.6 cm
Weight: 2400 [oz_av]

## 2023-05-13 LAB — TYPE AND SCREEN
ABO/RH(D): O POS
Antibody Screen: NEGATIVE

## 2023-05-13 LAB — LIPID PANEL
Cholesterol: 137 mg/dL (ref 0–200)
HDL: 57 mg/dL (ref >40)
LDL Cholesterol: 65 mg/dL (ref 0–99)
Total CHOL/HDL Ratio: 2.4 {ratio}
Triglycerides: 76 mg/dL (ref <150)
VLDL: 15 mg/dL (ref 0–40)

## 2023-05-13 LAB — HEMOGLOBIN A1C
Hgb A1c MFr Bld: 5.5 % (ref 4.8–5.6)
Mean Plasma Glucose: 111.15 mg/dL

## 2023-05-13 LAB — CBG MONITORING, ED: Glucose-Capillary: 85 mg/dL (ref 70–99)

## 2023-05-13 LAB — GLUCOSE, CAPILLARY
Glucose-Capillary: 97 mg/dL (ref 70–99)
Glucose-Capillary: 110 mg/dL — ABNORMAL HIGH (ref 70–99)
Glucose-Capillary: 101 mg/dL — ABNORMAL HIGH (ref 70–99)

## 2023-05-13 LAB — VITAMIN B12: Vitamin B-12: 612 pg/mL (ref 180–914)

## 2023-05-13 MED ORDER — ASPIRIN 81 MG PO TBEC
81.0000 mg | DELAYED_RELEASE_TABLET | Freq: Every day | ORAL | Status: DC
  Administered 2023-05-13: 81 mg via ORAL
  Filled 2023-05-13 (×2): qty 1

## 2023-05-13 MED ORDER — ROSUVASTATIN CALCIUM 10 MG PO TABS
20.0000 mg | ORAL_TABLET | Freq: Every day | ORAL | Status: DC
  Administered 2023-05-13 – 2023-05-14 (×2): 20 mg via ORAL
  Filled 2023-05-13: qty 2
  Filled 2023-05-13: qty 1

## 2023-05-13 MED ORDER — CEFAZOLIN SODIUM-DEXTROSE 2-4 GM/100ML-% IV SOLN
2.0000 g | INTRAVENOUS | Status: AC
  Administered 2023-05-14: 2 g via INTRAVENOUS
  Filled 2023-05-13: qty 100

## 2023-05-13 MED ORDER — GABAPENTIN 400 MG PO CAPS
800.0000 mg | ORAL_CAPSULE | Freq: Every morning | ORAL | Status: DC
  Administered 2023-05-15: 800 mg via ORAL
  Filled 2023-05-13: qty 2
  Filled 2023-05-13: qty 8
  Filled 2023-05-13: qty 2

## 2023-05-13 MED ORDER — GABAPENTIN 400 MG PO CAPS
1600.0000 mg | ORAL_CAPSULE | Freq: Every day | ORAL | Status: DC
  Administered 2023-05-13 – 2023-05-14 (×2): 1600 mg via ORAL
  Filled 2023-05-13: qty 4
  Filled 2023-05-13: qty 1

## 2023-05-13 MED ORDER — AMLODIPINE BESYLATE 5 MG PO TABS
5.0000 mg | ORAL_TABLET | Freq: Every day | ORAL | Status: DC
  Administered 2023-05-13 – 2023-05-15 (×2): 5 mg via ORAL
  Filled 2023-05-13 (×3): qty 1

## 2023-05-13 MED ORDER — GABAPENTIN 400 MG PO CAPS: 800.0000 mg | ORAL_CAPSULE | Freq: Every day | ORAL | Status: DC

## 2023-05-13 MED ORDER — SODIUM CHLORIDE 0.9 % IV SOLN: INTRAVENOUS | Status: DC

## 2023-05-13 MED ORDER — HEPARIN (PORCINE) 25000 UT/250ML-% IV SOLN
950.0000 [IU]/h | INTRAVENOUS | Status: DC
  Administered 2023-05-14: 950 [IU]/h via INTRAVENOUS
  Filled 2023-05-13: qty 250

## 2023-05-13 MED ORDER — CARVEDILOL 6.25 MG PO TABS
6.2500 mg | ORAL_TABLET | Freq: Two times a day (BID) | ORAL | Status: DC
  Administered 2023-05-13 – 2023-05-15 (×4): 6.25 mg via ORAL
  Filled 2023-05-13 (×4): qty 1

## 2023-05-13 NOTE — Evaluation (Signed)
Occupational Therapy Evaluation Patient Details Name: Renee [name] MRN: [id number] DOB: [birth year] Today's Date: 05/13/2023   History of Present Illness Pt is a 72 year old female admitted with TIA, carotid stenosis     PMH significant for stage III CKD, CAD status post stenting, aortic stenosis status post TAVR, hypertension, type 2 diabetes presenting with TIA   Clinical Impression   Chart reviewed, pt greeted in chair, agreeable to OT evaluation. Pt is alert and oriented x4, mildly impulsive throughout with vcs required for safe negotiation throughout room. Pt is performing ADL at a supervision level at this time. Pt reports her baseline is MOD I-I for ADL/IADL. Pt will benefit from acute OT to optimize functional status and to facilitate optimal ADL completion. Pt is left in chair, all needs met. OT will follow acutely.       If plan is discharge home, recommend the following: A little help with bathing/dressing/bathroom;Direct supervision/assist for medications management    Functional Status Assessment  Patient has had a recent decline in their functional status and demonstrates the ability to make significant improvements in function in a reasonable and predictable amount of time.  Equipment Recommendations  None recommended by OT    Recommendations for Other Services       Precautions / Restrictions Precautions Precautions: Fall Restrictions Weight Bearing Restrictions: No      Mobility Bed Mobility               General bed mobility comments: NT in recliner pre/post session    Transfers Overall transfer level: Modified independent                        Balance Overall balance assessment: Needs assistance Sitting-balance support: No upper extremity supported, Feet supported Sitting balance-Leahy Scale: Good     Standing balance support: No upper extremity supported Standing balance-Leahy Scale: Good                              ADL either performed or assessed with clinical judgement   ADL Overall ADL's : Needs assistance/impaired Eating/Feeding: Set up;Sitting   Grooming: Wash/dry face;Sitting;Supervision/safety           Upper Body Dressing : Supervision/safety   Lower Body Dressing: Contact guard assist   Toilet Transfer: Contact guard Designer, fashion/clothing- Clothing Manipulation and Hygiene: Supervision/safety;Sitting/lateral lean       Functional mobility during ADLs: Supervision/safety;Cueing for safety (approx 30' in room)       Vision Patient Visual Report: No change from baseline Vision Assessment?: Yes Ocular Range of Motion: Within Functional Limits Alignment/Gaze Preference: Within Defined Limits Tracking/Visual Pursuits: Able to track stimulus in all quads without difficulty Convergence: Within functional limits Visual Fields: No apparent deficits     Perception         Praxis         Pertinent Vitals/Pain       Extremity/Trunk Assessment Upper Extremity Assessment Upper Extremity Assessment: Overall WFL for tasks assessed   Lower Extremity Assessment Lower Extremity Assessment: Overall WFL for tasks assessed   Cervical / Trunk Assessment Cervical / Trunk Assessment: Normal   Communication Communication Communication: No apparent difficulties Cueing Techniques: Verbal cues;Tactile cues;Visual cues   Cognition Arousal: Alert Behavior During Therapy: WFL for tasks assessed/performed, Impulsive Overall Cognitive Status: No family/caregiver present to determine baseline cognitive functioning Area of Impairment: Safety/judgement, Problem solving  Safety/Judgement: Decreased awareness of safety   Problem Solving: Requires verbal cues General Comments: mildly impulsive throughout, requiring intermittent vcs throughout for safety     General Comments  vss throughout    Exercises Other Exercises Other  Exercises: edu re: role of OT, role of rehab   Shoulder Instructions      Home Living Family/patient expects to be discharged to:: Private residence Living Arrangements: Children Available Help at Discharge: Family Type of Home: House Home Access: Stairs to enter;Ramped entrance Entrance Stairs-Number of Steps: 5   Home Layout: One level     Bathroom Shower/Tub: Tub/shower unit                    Prior Functioning/Environment Prior Level of Function : Independent/Modified Independent             Mobility Comments: pt reports amb with no AD ADLs Comments: pt reports generally MOD I- I in ADL/IADL        OT Problem List: Decreased activity tolerance;Impaired balance (sitting and/or standing)      OT Treatment/Interventions: Self-care/ADL training;Therapeutic exercise;Patient/family education;Balance training;Therapeutic activities;DME and/or AE instruction    OT Goals(Current goals can be found in the care plan section) Acute Rehab OT Goals Patient Stated Goal: get stronger OT Goal Formulation: With patient Time For Goal Achievement: 05/27/23 Potential to Achieve Goals: Good ADL Goals Pt Will Perform Grooming: Independently Pt Will Perform Lower Body Dressing: Independently Pt Will Transfer to Toilet: Independently;ambulating Pt Will Perform Toileting - Clothing Manipulation and hygiene: Independently;sit to/from stand  OT Frequency: Min 1X/week    Co-evaluation              AM-PAC OT "6 Clicks" Daily Activity     Outcome Measure Help from another person eating meals?: None Help from another person taking care of personal grooming?: None Help from another person toileting, which includes using toliet, bedpan, or urinal?: None Help from another person bathing (including washing, rinsing, drying)?: A Little Help from another person to put on and taking off regular upper body clothing?: None Help from another person to put on and taking off regular  lower body clothing?: None 6 Click Score: 23   End of Session Equipment Utilized During Treatment: Gait belt  Activity Tolerance: Patient tolerated treatment well Patient left: in chair;with call bell/phone within reach;with chair alarm set  OT Visit Diagnosis: Other abnormalities of gait and mobility (R26.89)                Time: 1478-2956 OT Time Calculation (min): 17 min Charges:  OT General Charges $OT Visit: 1 Visit OT Evaluation $OT Eval Low Complexity: 1 Low  Oleta Mouse, OTD OTR/L  05/13/23, 2:27 PM

## 2023-05-13 NOTE — Evaluation (Signed)
Physical Therapy Evaluation Patient Details Name: ETSUKO HUMMEL MRN: [id number] DOB: Jul 11, 1950 Today's Date: 05/13/2023  History of Present Illness  Pt is a 72 year old female admitted with TIA, carotid stenosis     PMH significant for stage III CKD, CAD status post stenting, aortic stenosis status post TAVR, hypertension, type 2 diabetes presenting with TIA  Clinical Impression  Patient seated edge of bed upon arrival to room (recently arrived for admission from ED).  Supportive sister present at bedside.  Patient alert and oriented to basic information; follows commands, but does require frequent cuing for safety awareness.  Patient generally impulsive, but easily redirectable. Bilat UE/LE strength and ROM grossly symmetrical and WFL; no focal weakness, sensory or coordination deficits appreciated.  Able to complete bed mobility with indep; sit/stand, basic transfers and gait (200') without assist device, sup.  Demonstrates reciprocal stepping pattern, inconsistent step length/width (L > R); limited trunk rotation and L arm swing. Fair cadence (10' walk time, 6-7 seconds). Mild/mod deviation with head turns, reliant on LE step strategy for recovery.  Stairs deferred due to IV. Would benefit from skilled PT to address above deficits and promote optimal return to PLOF.; recommend post-acute PT follow up as indicated by interdisciplinary care team.             If plan is discharge home, recommend the following: A little help with walking and/or transfers;A little help with bathing/dressing/bathroom   Can travel by private vehicle        Equipment Recommendations    Recommendations for Other Services       Functional Status Assessment Patient has had a recent decline in their functional status and demonstrates the ability to make significant improvements in function in a reasonable and predictable amount of time.     Precautions / Restrictions Precautions Precautions:  Fall Restrictions Weight Bearing Restrictions: No      Mobility  Bed Mobility Overal bed mobility: Independent                  Transfers Overall transfer level: Modified independent Equipment used: None                    Ambulation/Gait Ambulation/Gait assistance: Supervision Gait Distance (Feet): 200 Feet Assistive device: None   Gait velocity: 10' walkt ime, 6-7 seconds     General Gait Details: reciprocal stepping pattern, inconsistent step length/width (L > R); limited trunk rotation and L arm swing.  Fair cadence.  Mild/mod deviation with head turns, reliant on LE step strategy for recovery  Stairs            Wheelchair Mobility     Tilt Bed    Modified Rankin (Stroke Patients Only)       Balance Overall balance assessment: Needs assistance Sitting-balance support: No upper extremity supported, Feet supported Sitting balance-Leahy Scale: Good     Standing balance support: No upper extremity supported Standing balance-Leahy Scale: Good                               Pertinent Vitals/Pain Pain Assessment Pain Assessment: No/denies pain    Home Living Family/patient expects to be discharged to:: Private residence Living Arrangements: Children Available Help at Discharge: Family Type of Home: House Home Access: Stairs to enter;Ramped entrance   Entrance Stairs-Number of Steps: 5   Home Layout: One level        Prior Function Prior Level  of Function : Independent/Modified Independent             Mobility Comments: pt reports amb with no AD ADLs Comments: pt reports generally MOD I- I in ADL/IADL     Extremity/Trunk Assessment   Upper Extremity Assessment Upper Extremity Assessment: Overall WFL for tasks assessed (grossly 4+/5 throughout; no focal weakness, sensory or coordination deficits appreciated)    Lower Extremity Assessment Lower Extremity Assessment:  (grossly 4+/5 throughout; no focal  weakness, sensory or coordination deficits appreciated)       Communication   Communication Communication: No apparent difficulties Cueing Techniques: Verbal cues;Tactile cues;Visual cues  Cognition                                                General Comments      Exercises Other Exercises Other Exercises: Toilet transfer, ambulatory with RW, close sup; sit/stand from standard height toilet, sup/mod indep; standing balance for hygiene, clothing management and hand hygiene, sup.  Generally impulsive with task, but no overt LOB noted   Assessment/Plan    PT Assessment Patient needs continued PT services  PT Problem List Decreased strength;Decreased range of motion;Decreased activity tolerance;Decreased balance;Decreased coordination;Decreased mobility;Decreased safety awareness;Decreased knowledge of precautions       PT Treatment Interventions DME instruction;Gait training;Stair training;Functional mobility training;Therapeutic exercise;Therapeutic activities;Balance training;Neuromuscular re-education;Cognitive remediation;Patient/family education    PT Goals (Current goals can be found in the Care Plan section)  Acute Rehab PT Goals Patient Stated Goal: to get up and go to the bathroom! PT Goal Formulation: With patient Time For Goal Achievement: 05/27/23 Potential to Achieve Goals: Good    Frequency Min 1X/week     Co-evaluation               AM-PAC PT "6 Clicks" Mobility  Outcome Measure Help needed turning from your back to your side while in a flat bed without using bedrails?: None Help needed moving from lying on your back to sitting on the side of a flat bed without using bedrails?: None Help needed moving to and from a bed to a chair (including a wheelchair)?: A Little Help needed standing up from a chair using your arms (e.g., wheelchair or bedside chair)?: A Little Help needed to walk in hospital room?: A Little Help needed  climbing 3-5 steps with a railing? : A Little 6 Click Score: 20    End of Session Equipment Utilized During Treatment: Gait belt Activity Tolerance: Patient tolerated treatment well Patient left: in chair;with chair alarm set;with call bell/phone within reach;with family/visitor present Nurse Communication: Mobility status PT Visit Diagnosis: Muscle weakness (generalized) (M62.81);Hemiplegia and hemiparesis Hemiplegia - Right/Left: Left Hemiplegia - dominant/non-dominant: Non-dominant Hemiplegia - caused by: Other cerebrovascular disease    Time: 1202-1228 PT Time Calculation (min) (ACUTE ONLY): 26 min   Charges:   PT Evaluation $PT Eval Moderate Complexity: 1 Mod   PT General Charges $$ ACUTE PT VISIT: 1 Visit         Lori-Ann Lindfors H. Manson Passey, PT, DPT, NCS 05/13/23, 2:17 PM [phone]

## 2023-05-13 NOTE — Progress Notes (Signed)
*  PRELIMINARY RESULTS* Echocardiogram 2D Echocardiogram has been performed.  Renee [name] 05/13/2023, 8:52 AM

## 2023-05-13 NOTE — Progress Notes (Signed)
*  PRELIMINARY RESULTS* Echocardiogram 2D Echocardiogram has been performed.  Cristela Blue 05/13/2023, 8:52 AM

## 2023-05-13 NOTE — Progress Notes (Signed)
PHARMACY - ANTICOAGULATION CONSULT NOTE  Pharmacy Consult for Heparin  Indication:  carotid artery stenosis  / CEA on 12/6   Allergies  Allergen Reactions   Tizanidine     headaches   Venlafaxine Other (See Comments)    intolerant   Codeine Nausea And Vomiting   Tramadol Nausea And Vomiting    Patient Measurements: Height: 5\' 3"  (160 cm) Weight: 68 kg (150 lb) IBW/kg (Calculated) : 52.4 Heparin Dosing Weight: 66.3 kg   Vital Signs: Temp: 98.4 F (36.9 C) (12/05 0750) Temp Source: Oral (12/05 0750) BP: 151/52 (12/05 0730) Pulse Rate: 65 (12/05 0730)  Labs: Recent Labs    05/12/23 1007  HGB 10.9*  HCT 32.8*  PLT 148*  CREATININE 0.94    Estimated Creatinine Clearance: 49.3 mL/min (by C-G formula based on SCr of 0.94 mg/dL).   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 Excell Seltzer & Dr. Laneta Simmers   Severe aortic stenosis     Medications:  (Not in a hospital admission)   Assessment: Pharmacy consulted to dose heparin in this 72 year old female admitted with CVA, carotid artery stenosis.  CEA planned for 12/6.   No anticoag PTA noted.  Pt did receive lovenox 40 mg SQ X 1 on 12/4 @ 2248.    CrCl = 49.3 ml/min   Date/Time HL Rate  Comment 12/5 0735 >1.1 1100 units/hr SUPRAtherapeutic   Goal of Therapy:  Heparin level 0.3-0.7 units/ml Monitor platelets by anticoagulation protocol: Yes   Plan:  Heparin level SUPRAtherapeutic  Hold heparin gtt for 1 hour and resume at a decreased rate of 950 units/hr Check heparin level in 8 hours Monitor CBC daily  Merryl Hacker, PharmD Clinical Pharmacist 05/13/2023,8:16 AM

## 2023-05-13 NOTE — ED Notes (Signed)
This RN called 1C to inform pt is coming up on hospital bed. Staff stating pt's room is still dirty. Charge RN made aware.

## 2023-05-13 NOTE — Progress Notes (Signed)
Progress Note    05/13/2023 11:57 AM * No surgery date entered *  Subjective:  Renee [name] is a 71 yo female who presented to [facility] emergency department with "just not feeling right" and having numbness and tingling down both upper extremities left greater than right and also complaining of a severe headache with tingling to her left leg.  Upon exam this morning patient is resting comfortably in bed.  She does endorse that she feels better today and the headache is not as bad.  Patient is somewhat nervous about surgery tomorrow but understands and wishes to proceed.  No complaints overnight.  Vitals are remained stable   Vitals:   05/13/23 1100 05/13/23 1130  BP: (!) 151/70   Pulse: 73 70  Resp: 20 13  Temp:    SpO2: 96% 99%   Physical Exam: Cardiac:  RRR, Normal S1, S2. No murmurs. Apprciate TAVR Valve replacement on auscultation. Noted Aortic Stenosis.  Lungs: Nonlabored, without rales rhonchi or wheezing. Incisions: None Extremities: Palpable pulses throughout. Abdomen: Positive bowel sounds throughout, soft, nontender and nondistended. Neurologic: Alert and oriented x 3.  No focal weakness in upper or lower extremities.  Patient's speech is fluent and normal at this time.  No neurologic deficits to note.  CBC    Component Value Date/Time   WBC 5.0 05/12/2023 1007   RBC 3.08 (L) 05/12/2023 1007   HGB 10.9 (L) 05/12/2023 1007   HGB 10.6 (L) 09/08/2022 1245   HCT 32.8 (L) 05/12/2023 1007   HCT 33.9 (L) 09/08/2022 1245   PLT 148 (L) 05/12/2023 1007   PLT 232 09/08/2022 1245   MCV 106.5 (H) 05/12/2023 1007   MCV 96 09/08/2022 1245   MCV 91 10/22/2011 0340   MCH 35.4 (H) 05/12/2023 1007   MCHC 33.2 05/12/2023 1007   RDW 14.7 05/12/2023 1007   RDW 17.0 (H) 09/08/2022 1245   RDW 14.1 10/22/2011 0340   LYMPHSABS 1.5 05/12/2023 1007   LYMPHSABS 2.1 10/22/2011 0340   MONOABS 0.7 05/12/2023 1007   MONOABS 0.9 10/22/2011 0340   EOSABS 0.1 05/12/2023 1007   EOSABS 0.0  10/22/2011 0340   BASOSABS 0.0 05/12/2023 1007   BASOSABS 0.0 10/22/2011 0340    BMET    Component Value Date/Time   NA 138 05/12/2023 1007   NA 145 (H) 09/08/2022 1245   NA 141 10/22/2011 0340   K 3.8 05/12/2023 1007   K 3.7 02/03/2012 1219   CL 108 05/12/2023 1007   CL 108 (H) 10/22/2011 0340   CO2 22 05/12/2023 1007   CO2 25 10/22/2011 0340   GLUCOSE 121 (H) 05/12/2023 1007   GLUCOSE 85 10/22/2011 0340   BUN 22 05/12/2023 1007   BUN 21 09/08/2022 1245   BUN 30 (H) 10/22/2011 0340   CREATININE 0.94 05/12/2023 1007   CREATININE 1.38 (H) 10/22/2011 0340   CALCIUM 9.3 05/12/2023 1007   CALCIUM 8.1 (L) 10/22/2011 0340   GFRNONAA >60 05/12/2023 1007   GFRNONAA 41 (L) 10/22/2011 0340   GFRAA >60 10/28/2018 0445   GFRAA 47 (L) 10/22/2011 0340    INR    Component Value Date/Time   INR 1.2 10/09/2022 1000     Intake/Output Summary (Last 24 hours) at 05/13/2023 1157 Last data filed at 05/12/2023 1353 Gross per 24 hour  Intake 30 ml  Output --  Net 30 ml     Assessment/Plan:  72 y.o. female who presented to [facility] emergency department with severe headache and symptoms of TIA.  Upon workup patient was noted to have bilateral carotid stenosis greater than 80%.* No surgery date entered *   PLAN: Patient is scheduled with vascular surgery for a right carotid open endarterectomy tomorrow on 05/14/2023.  I discussed again today the procedure benefits risk and complications with the patient.  She verbalizes her understanding again.  She wishes to proceed.  Patient was made n.p.o. after midnight for surgery tomorrow.  DVT prophylaxis: Heparin infusion  I discussed the plan in detail with Dr. Levora Dredge MD and he agrees with the plan   Renee [name] [facility] 05/13/2023 11:57 AM

## 2023-05-13 NOTE — Progress Notes (Addendum)
Progress Note    Renee [name]  NGE:[id number] DOB: 1950-07-27  DOA: 05/12/2023 PCP: Lynnea Ferrier, MD      Brief Narrative:    Medical records reviewed and are as summarized below:  Renee [name] is a 72 y.o. female with medical history significant of stage III CKD, CAD status post stenting, aortic stenosis status post TAVR, hypertension, type 2 diabetes, who presented to the hospital with headache, numbness in the left arm and left leg.  Last known normal was around 11 PM on 05/12/2023.  She woke up around 1:30 AM and 05/13/2023 with her symptoms.  She said she had left knee replacement some years ago and since then she has been having some numbness extending from the left knee to the left foot.  Workup was negative for acute stroke.  However, she was found to have bilateral internal carotid artery stenosis that is more severe on the left.         Assessment/Plan:   Principal Problem:   Internal carotid artery stenosis, bilateral Active Problems:   TIA (transient ischemic attack)   Stage 3a chronic kidney disease (CKD) (HCC)   Type 2 diabetes mellitus with diabetic polyneuropathy, without long-term current use of insulin (HCC)   Essential hypertension   Hx of CABG   S/P TAVR (transcatheter aortic valve replacement)   Tobacco abuse    Body mass index is 26.57 kg/m.    Proximal left ICA 70% stenosis, proximal right ICA 50% stenosis: IV heparin drip per vascular surgeon.  Monitor heparin level per protocol.  Continue aspirin and rosuvastatin.  Plan for surgical intervention. 2D echo showed EF estimate 30 to 65%, grade 1 diastolic dysfunction, moderate TR.   Left-sided numbness and headache: Improved.  It is not clear whether patient had a TIA.  Of note, patient also has moderate cervical spondylosis.  She also attributed left leg numbness to history of left knee replacement.   Hypertension: Continue amlodipine and carvedilol   Type II DM with polyneuropathy:  As needed for hyperglycemia.  Continue gabapentin.  Hemoglobin A1c was 5.5. Lipid panel showed LDL 65, HDL 57, total cholesterol 401, triglycerides 76.   S/p TAVR for aortic stenosis in May 2024.   CAD s/p CABG: Cardiac catheterization in April 2024 showed widely patent coronary arteries and patent LIMA to LAD graft.   Tobacco use disorder: Counseled to quit smoking cigarettes.   Probable CKD stage IIIa.  Of note, patient has fluctuating GFR.  Diet Order             Diet heart healthy/carb modified Room service appropriate? Yes; Fluid consistency: Thin  Diet effective now                            Consultants: Vascular surgeon  Procedures: None    Medications:     stroke: early stages of recovery book   Does not apply Once   insulin aspart  0-9 Units Subcutaneous TID WC   nicotine  21 mg Transdermal Daily   Continuous Infusions:  heparin 950 Units/hr (05/13/23 1037)     Anti-infectives (From admission, onward)    None              Family Communication/Anticipated D/C date and plan/Code Status   DVT prophylaxis:      Code Status: Full Code  Family Communication: Plan discussed with Talbert Forest, sister, at the bedside Disposition Plan: Plan to discharge  home   Status is: Inpatient Remains inpatient appropriate because: Severe right carotid artery stenosis       Subjective:   Interval events noted.  She has no complaints.  She feels better.  No numbness, tingling or weakness in the extremities.  No headache.  Objective:    Vitals:   05/13/23 1040 05/13/23 1100 05/13/23 1130 05/13/23 1230  BP: (!) 150/67 (!) 151/70  (!) 162/76  Pulse: 65 73 70 85  Resp: 17 20 13 18   Temp:    98.6 F (37 C)  TempSrc:    Oral  SpO2: 99% 96% 99% 97%  Weight:      Height:       No data found.  No intake or output data in the 24 hours ending 05/13/23 1528  Filed Weights   05/12/23 0942  Weight: 68 kg    Exam:  GEN: NAD SKIN:  Warm and dry EYES: No pallor or icterus ENT: MMM CV: RRR PULM: CTA B ABD: soft, ND, NT, +BS CNS: AAO x 3, non focal EXT: No edema or tenderness        Data Reviewed:   I have personally reviewed following labs and imaging studies:  Labs: Labs show the following:   Basic Metabolic Panel: Recent Labs  Lab 05/12/23 1007  NA 138  K 3.8  CL 108  CO2 22  GLUCOSE 121*  BUN 22  CREATININE 0.94  CALCIUM 9.3  MG 2.3   GFR Estimated Creatinine Clearance: 49.3 mL/min (by C-G formula based on SCr of 0.94 mg/dL). Liver Function Tests: No results for input(s): "AST", "ALT", "ALKPHOS", "BILITOT", "PROT", "ALBUMIN" in the last 168 hours. No results for input(s): "LIPASE", "AMYLASE" in the last 168 hours. No results for input(s): "AMMONIA" in the last 168 hours. Coagulation profile No results for input(s): "INR", "PROTIME" in the last 168 hours.  CBC: Recent Labs  Lab 05/12/23 1007  WBC 5.0  NEUTROABS 2.7  HGB 10.9*  HCT 32.8*  MCV 106.5*  PLT 148*   Cardiac Enzymes: No results for input(s): "CKTOTAL", "CKMB", "CKMBINDEX", "TROPONINI" in the last 168 hours. BNP (last 3 results) No results for input(s): "PROBNP" in the last 8760 hours. CBG: Recent Labs  Lab 05/12/23 1619 05/13/23 0749 05/13/23 1250  GLUCAP 118* 85 97   D-Dimer: No results for input(s): "DDIMER" in the last 72 hours. Hgb A1c: Recent Labs    05/12/23 1402  HGBA1C 5.5   Lipid Profile: Recent Labs    05/13/23 0735  CHOL 137  HDL 57  LDLCALC 65  TRIG 76  CHOLHDL 2.4   Thyroid function studies: No results for input(s): "TSH", "T4TOTAL", "T3FREE", "THYROIDAB" in the last 72 hours.  Invalid input(s): "FREET3" Anemia work up: No results for input(s): "VITAMINB12", "FOLATE", "FERRITIN", "TIBC", "IRON", "RETICCTPCT" in the last 72 hours. Sepsis Labs: Recent Labs  Lab 05/12/23 1007  WBC 5.0    Microbiology No results found for this or any previous visit (from the past 240  hour(s)).  Procedures and diagnostic studies:  ECHOCARDIOGRAM COMPLETE  Result Date: 05/13/2023    ECHOCARDIOGRAM REPORT   Patient Name:   Renee [name] Date of Exam: 05/13/2023 Medical Rec #:  [id number]     Height:       63.0 in Accession #:    [id number]    Weight:       150.0 lb Date of Birth:  January 14, 1951     BSA:          1.711  m Patient Age:    72 years      BP:           151/52 mmHg Patient Gender: F             HR:           65 bpm. Exam Location:  [facility] Procedure: 2D Echo, Color Doppler, Cardiac Doppler and Saline Contrast Bubble            Study Indications:     TIA G45.9  History:         Patient has prior history of Echocardiogram examinations, most                  recent 11/18/2022. Risk Factors:Hypertension and Diabetes. S/P                  TAVR.  Sonographer:     Cristela Blue Referring Phys:  [id number] Francoise Schaumann [place] Diagnosing Phys: Julien Nordmann MD IMPRESSIONS  1. Left ventricular ejection fraction, by estimation, is 60 to 65%. The left ventricle has normal function. The left ventricle has no regional wall motion abnormalities. There is mild left ventricular hypertrophy. Left ventricular diastolic parameters are consistent with Grade I diastolic dysfunction (impaired relaxation).  2. Right ventricular systolic function is normal. The right ventricular size is normal. There is mildly elevated pulmonary artery systolic pressure. The estimated right ventricular systolic pressure is 42.9 mmHg.  3. The mitral valve is normal in structure. Mild mitral valve regurgitation. No evidence of mitral stenosis.  4. Tricuspid valve regurgitation is moderate.  5. The aortic valve is normal in structure. Aortic valve regurgitation is not visualized. No aortic stenosis is present.  6. The inferior vena cava is normal in size with greater than 50% respiratory variability, suggesting right atrial pressure of 3 mmHg.  7. Agitated saline contrast bubble study was negative, with no evidence of any interatrial shunt.  FINDINGS  Left Ventricle: Left ventricular ejection fraction, by estimation, is 60 to 65%. The left ventricle has normal function. The left ventricle has no regional wall motion abnormalities. The left ventricular internal cavity size was normal in size. There is  mild left ventricular hypertrophy. Left ventricular diastolic parameters are consistent with Grade I diastolic dysfunction (impaired relaxation). Right Ventricle: The right ventricular size is normal. No increase in right ventricular wall thickness. Right ventricular systolic function is normal. There is mildly elevated pulmonary artery systolic pressure. The tricuspid regurgitant velocity is 3.08  m/s, and with an assumed right atrial pressure of 5 mmHg, the estimated right ventricular systolic pressure is 42.9 mmHg. Left Atrium: Left atrial size was normal in size. Right Atrium: Right atrial size was normal in size. Pericardium: There is no evidence of pericardial effusion. Mitral Valve: The mitral valve is normal in structure. Mild mitral valve regurgitation. No evidence of mitral valve stenosis. MV peak gradient, 5.5 mmHg. The mean mitral valve gradient is 2.0 mmHg. Tricuspid Valve: The tricuspid valve is normal in structure. Tricuspid valve regurgitation is moderate . No evidence of tricuspid stenosis. Aortic Valve: The aortic valve is normal in structure. Aortic valve regurgitation is not visualized. No aortic stenosis is present. Aortic valve mean gradient measures 5.0 mmHg. Aortic valve peak gradient measures 9.5 mmHg. Aortic valve area, by VTI measures 3.18 cm. Pulmonic Valve: The pulmonic valve was normal in structure. Pulmonic valve regurgitation is not visualized. No evidence of pulmonic stenosis. Aorta: The aortic root is normal in size and structure. Venous: The inferior vena  cava is normal in size with greater than 50% respiratory variability, suggesting right atrial pressure of 3 mmHg. IAS/Shunts: No atrial level shunt detected by color  flow Doppler. Agitated saline contrast was given intravenously to evaluate for intracardiac shunting. Agitated saline contrast bubble study was negative, with no evidence of any interatrial shunt. There  is no evidence of a patent foramen ovale. There is no evidence of an atrial septal defect.  LEFT VENTRICLE PLAX 2D LVIDd:         4.20 cm   Diastology LVIDs:         2.60 cm   LV e' medial:    5.66 cm/s LV PW:         1.30 cm   LV E/e' medial:  17.8 LV IVS:        1.40 cm   LV e' lateral:   6.96 cm/s LVOT diam:     2.00 cm   LV E/e' lateral: 14.5 LV SV:         100 LV SV Index:   58 LVOT Area:     3.14 cm  RIGHT VENTRICLE RV Basal diam:  2.80 cm RV Mid diam:    1.70 cm LEFT ATRIUM             Index        RIGHT ATRIUM           Index LA diam:        3.20 cm 1.87 cm/m   RA Area:     14.10 cm LA Vol (A2C):   39.2 ml 22.91 ml/m  RA Volume:   33.20 ml  19.40 ml/m LA Vol (A4C):   43.9 ml 25.66 ml/m LA Biplane Vol: 45.1 ml 26.36 ml/m  AORTIC VALVE AV Area (Vmax):    2.34 cm AV Area (Vmean):   2.50 cm AV Area (VTI):     3.18 cm AV Vmax:           154.50 cm/s AV Vmean:          103.000 cm/s AV VTI:            0.314 m AV Peak Grad:      9.5 mmHg AV Mean Grad:      5.0 mmHg LVOT Vmax:         115.00 cm/s LVOT Vmean:        82.000 cm/s LVOT VTI:          0.318 m LVOT/AV VTI ratio: 1.01  AORTA Ao Root diam: 2.10 cm MITRAL VALVE                TRICUSPID VALVE MV Area (PHT): 2.80 cm     TR Peak grad:   37.9 mmHg MV Area VTI:   2.67 cm     TR Vmax:        308.00 cm/s MV Peak grad:  5.5 mmHg MV Mean grad:  2.0 mmHg     SHUNTS MV Vmax:       1.17 m/s     Systemic VTI:  0.32 m MV Vmean:      69.9 cm/s    Systemic Diam: 2.00 cm MV Decel Time: 271 msec MV E velocity: 101.00 cm/s MV A velocity: 119.00 cm/s MV E/A ratio:  0.85 Julien Nordmann MD Electronically signed by Julien Nordmann MD Signature Date/Time: 05/13/2023/12:24:44 PM    Final    MR ANGIO HEAD WO CONTRAST  Result Date: 05/12/2023 CLINICAL DATA:  Worsening  headache and left-sided tingling EXAM: MRA NECK WITHOUT AND WITH CONTRAST MRA HEAD WITHOUT CONTRAST TECHNIQUE: Multiplanar and multiecho pulse sequences of the neck were obtained without and with intravenous contrast. Angiographic images of the neck were obtained using MRA technique without and with intravenous contrast; Angiographic images of the Circle of Willis were obtained using MRA technique without intravenous contrast. CONTRAST:  6mL GADAVIST GADOBUTROL 1 MMOL/ML IV SOLN COMPARISON:  No prior MRA, correlation is made with 05/12/2023 CTA head and neck and 05/12/2023 MRI head FINDINGS: MRA NECK FINDINGS Aortic arch: Standard aortic branching. No evidence of aneurysm or dissection in the imaged portion. No significant stenosis of the major arch vessel origins. Right carotid system: 50% stenosis in the proximal right ICA. No evidence of dissection. Left carotid system: 70% stenosis in the proximal left ICA. No evidence of dissection. Vertebral arteries: Left dominant system, with diminutive right vertebral artery. No evidence of dissection, occlusion, or hemodynamically significant stenosis (greater than 50%). MRA HEAD FINDINGS Anterior circulation: Both internal carotid arteries are patent to the termini, without significant stenosis. A1 segments patent, with moderate stenosis in the distal right A1. Normal anterior communicating artery. Anterior cerebral arteries are patent to their distal aspects without significant stenosis. No M1 stenosis or occlusion. Distal MCA branches perfused to their distal aspects without significant stenosis. Posterior circulation: Vertebral arteries patent to the vertebrobasilar junction without significant stenosis, although there is poor signal in the diminutive right vertebral artery, likely due to the size of the vessel. Posterior inferior cerebral arteries patent bilaterally, better visualized on the MRA neck. Basilar patent to its distal aspect. Superior cerebellar arteries  patent proximally. Patent, diminutive P1 segments. Near fetal origin the bilateral PCAs from the posterior communicating arteries. PCAs perfused to their distal aspects without significant stenosis. Anatomic variants: Near fetal origin of the bilateral PCAs. IMPRESSION: 1. 70% stenosis in the proximal left ICA. 2. 50% stenosis in the proximal right ICA. 3. No intracranial large vessel occlusion or significant stenosis. Electronically Signed   By: Wiliam Ke M.D.   On: 05/12/2023 19:38   MR ANGIO NECK W WO CONTRAST  Result Date: 05/12/2023 CLINICAL DATA:  Worsening headache and left-sided tingling EXAM: MRA NECK WITHOUT AND WITH CONTRAST MRA HEAD WITHOUT CONTRAST TECHNIQUE: Multiplanar and multiecho pulse sequences of the neck were obtained without and with intravenous contrast. Angiographic images of the neck were obtained using MRA technique without and with intravenous contrast; Angiographic images of the Circle of Willis were obtained using MRA technique without intravenous contrast. CONTRAST:  6mL GADAVIST GADOBUTROL 1 MMOL/ML IV SOLN COMPARISON:  No prior MRA, correlation is made with 05/12/2023 CTA head and neck and 05/12/2023 MRI head FINDINGS: MRA NECK FINDINGS Aortic arch: Standard aortic branching. No evidence of aneurysm or dissection in the imaged portion. No significant stenosis of the major arch vessel origins. Right carotid system: 50% stenosis in the proximal right ICA. No evidence of dissection. Left carotid system: 70% stenosis in the proximal left ICA. No evidence of dissection. Vertebral arteries: Left dominant system, with diminutive right vertebral artery. No evidence of dissection, occlusion, or hemodynamically significant stenosis (greater than 50%). MRA HEAD FINDINGS Anterior circulation: Both internal carotid arteries are patent to the termini, without significant stenosis. A1 segments patent, with moderate stenosis in the distal right A1. Normal anterior communicating artery.  Anterior cerebral arteries are patent to their distal aspects without significant stenosis. No M1 stenosis or occlusion. Distal MCA branches perfused to their distal aspects without significant stenosis. Posterior circulation: Vertebral  arteries patent to the vertebrobasilar junction without significant stenosis, although there is poor signal in the diminutive right vertebral artery, likely due to the size of the vessel. Posterior inferior cerebral arteries patent bilaterally, better visualized on the MRA neck. Basilar patent to its distal aspect. Superior cerebellar arteries patent proximally. Patent, diminutive P1 segments. Near fetal origin the bilateral PCAs from the posterior communicating arteries. PCAs perfused to their distal aspects without significant stenosis. Anatomic variants: Near fetal origin of the bilateral PCAs. IMPRESSION: 1. 70% stenosis in the proximal left ICA. 2. 50% stenosis in the proximal right ICA. 3. No intracranial large vessel occlusion or significant stenosis. Electronically Signed   By: Wiliam Ke M.D.   On: 05/12/2023 19:38   MR BRAIN WO CONTRAST  Result Date: 05/12/2023 CLINICAL DATA:  Neuro deficit, acute, stroke suspected. Left-sided arm and leg numbness and tingling. EXAM: MRI HEAD WITHOUT CONTRAST TECHNIQUE: Multiplanar, multiecho pulse sequences of the brain and surrounding structures were obtained without intravenous contrast. COMPARISON:  CT studies same day.  MRI 12/23/2003. FINDINGS: Brain: Diffusion imaging does not show any acute or subacute infarction or other cause of restricted diffusion. No focal abnormality affects the brainstem or cerebellum. Cerebral hemispheres show minimal small vessel change of the deep white matter, less than often seen at this age. 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 This is not significant. No evidence of obstructive hydrocephalus. No mass, hemorrhage or extra-axial collection. Vascular: Major  vessels at the base of the brain show flow. Skull and upper cervical spine: Negative Sinuses/Orbits: Clear/normal Other: None IMPRESSION: No acute or reversible finding. Minimal small vessel change of the deep white matter, less than often seen at this age. Electronically Signed   By: Paulina Fusi M.D.   On: 05/12/2023 17:05   CT ANGIO HEAD NECK W WO CM  Result Date: 05/12/2023 CLINICAL DATA:  Left-sided arm and leg numbness and tingling; headache EXAM: CT ANGIOGRAPHY HEAD AND NECK WITH AND WITHOUT CONTRAST TECHNIQUE: Multidetector CT imaging of the head and neck was performed using the standard protocol during bolus administration of intravenous contrast. Multiplanar CT image reconstructions and MIPs were obtained to evaluate the vascular anatomy. Carotid stenosis measurements (when applicable) are obtained utilizing NASCET criteria, using the distal internal carotid diameter as the denominator. RADIATION DOSE REDUCTION: This exam was performed according to the departmental dose-optimization program which includes automated exposure control, adjustment of the mA and/or kV according to patient size and/or use of iterative reconstruction technique. CONTRAST:  75mL OMNIPAQUE IOHEXOL 350 MG/ML SOLN COMPARISON:  No prior CTA available, correlation is made with CT head 10/31/2021 FINDINGS: CT HEAD FINDINGS Brain: No evidence of acute infarct, hemorrhage, mass, mass effect, or midline shift. No hydrocephalus or extra-axial fluid collection. Cavum septum pellucidum et vergae. Periventricular white matter changes, likely the sequela of chronic small vessel ischemic disease. Normal cerebral volume for age. Vascular: No hyperdense vessel. Skull: Negative for fracture or focal lesion. Sinuses/Orbits: No acute finding. Other: The mastoid air cells are well aerated. CTA NECK FINDINGS Aortic arch: Standard branching. Imaged portion shows no evidence of aneurysm or dissection. No significant stenosis of the major arch vessel  origins. Aortic atherosclerosis Right carotid system: 50% stenosis in the proximal right ICA (series 10, image 208), primarily secondary to calcified plaque. No evidence of dissection. Left carotid system: Greater than 70% stenosis in the proximal left ICA (series 10, image 205), primarily secondary to calcified plaque. No evidence of dissection. Vertebral arteries: No  evidence of dissection, occlusion, or hemodynamically significant stenosis (greater than 50%). The right vertebral artery is diminutive throughout its course Skeleton: No acute osseous abnormality. Degenerative changes in the cervical spine. Status post median sternotomy. Other neck: 1.3 cm hypoenhancing nodule in the left thyroid lobe, for which no follow-up is currently indicated. (Reference: J Am Coll Radiol. 2015 Feb;12(2): 143-50) with calcifications but Upper chest: No focal pulmonary opacity or pleural effusion. Review of the MIP images confirms the above findings CTA HEAD FINDINGS Anterior circulation: Both internal carotid arteries are patent to the termini, without significant stenosis. A1 segments patent, with severe stenosis distal right A1 (series 10, image 110). Normal anterior communicating artery. Anterior cerebral arteries are patent to their distal aspects without significant stenosis. No M1 stenosis or occlusion. MCA branches perfused to their distal aspects without significant stenosis. Posterior circulation: The right vertebral artery is diminutive and is not well seen at the vertebrobasilar junction. The left vertebral artery patent to the vertebrobasilar junction without significant stenosis. The left PICA is patent. The right PICA is not definitively seen. Basilar patent to its distal aspect without significant stenosis. Superior cerebellar arteries patent proximally. Patent, diminutive P1 segments. Near fetal origin of the bilateral PCAs from the posterior communicating arteries. PCAs perfused to their distal aspects without  significant stenosis. Venous sinuses: As permitted by contrast timing, patent. Anatomic variants: Near fetal origin of the bilateral PCAs. No evidence of aneurysm or vascular malformation. Review of the MIP images confirms the above findings IMPRESSION: 1. No acute intracranial process. 2. Greater than 70% stenosis in the proximal left ICA and 50% stenosis in the proximal right ICA. 3. Severe stenosis in the distal right A1. 4. Aortic atherosclerosis. Aortic Atherosclerosis (ICD10-I70.0). Electronically Signed   By: Wiliam Ke M.D.   On: 05/12/2023 12:00               LOS: 1 day   Garrie Elenes  [facility]   Pager on [email or web address]. If 7PM-7AM, please contact night-coverage at [email or web address]     05/13/2023, 3:28 PM

## 2023-05-13 NOTE — H&P (View-Only) (Signed)
 Progress Note    05/13/2023 11:57 AM * No surgery date entered *  Subjective:  Renee [name] is a 72 yo female who presented to [facility] emergency department with "just not feeling right" and having numbness and tingling down both upper extremities left greater than right and also complaining of a severe headache with tingling to her left leg.  Upon exam this morning patient is resting comfortably in bed.  She does endorse that she feels better today and the headache is not as bad.  Patient is somewhat nervous about surgery tomorrow but understands and wishes to proceed.  No complaints overnight.  Vitals are remained stable   Vitals:   05/13/23 1100 05/13/23 1130  BP: (!) 151/70   Pulse: 73 70  Resp: 20 13  Temp:    SpO2: 96% 99%   Physical Exam: Cardiac:  RRR, Normal S1, S2. No murmurs. Apprciate TAVR Valve replacement on auscultation. Noted Aortic Stenosis.  Lungs: Nonlabored, without rales rhonchi or wheezing. Incisions: None Extremities: Palpable pulses throughout. Abdomen: Positive bowel sounds throughout, soft, nontender and nondistended. Neurologic: Alert and oriented x 3.  No focal weakness in upper or lower extremities.  Patient's speech is fluent and normal at this time.  No neurologic deficits to note.  CBC    Component Value Date/Time   WBC 5.0 05/12/2023 1007   RBC 3.08 (L) 05/12/2023 1007   HGB 10.9 (L) 05/12/2023 1007   HGB 10.6 (L) 09/08/2022 1245   HCT 32.8 (L) 05/12/2023 1007   HCT 33.9 (L) 09/08/2022 1245   PLT 148 (L) 05/12/2023 1007   PLT 232 09/08/2022 1245   MCV 106.5 (H) 05/12/2023 1007   MCV 96 09/08/2022 1245   MCV 91 10/22/2011 0340   MCH 35.4 (H) 05/12/2023 1007   MCHC 33.2 05/12/2023 1007   RDW 14.7 05/12/2023 1007   RDW 17.0 (H) 09/08/2022 1245   RDW 14.1 10/22/2011 0340   LYMPHSABS 1.5 05/12/2023 1007   LYMPHSABS 2.1 10/22/2011 0340   MONOABS 0.7 05/12/2023 1007   MONOABS 0.9 10/22/2011 0340   EOSABS 0.1 05/12/2023 1007   EOSABS 0.0  10/22/2011 0340   BASOSABS 0.0 05/12/2023 1007   BASOSABS 0.0 10/22/2011 0340    BMET    Component Value Date/Time   NA 138 05/12/2023 1007   NA 145 (H) 09/08/2022 1245   NA 141 10/22/2011 0340   K 3.8 05/12/2023 1007   K 3.7 02/03/2012 1219   CL 108 05/12/2023 1007   CL 108 (H) 10/22/2011 0340   CO2 22 05/12/2023 1007   CO2 25 10/22/2011 0340   GLUCOSE 121 (H) 05/12/2023 1007   GLUCOSE 85 10/22/2011 0340   BUN 22 05/12/2023 1007   BUN 21 09/08/2022 1245   BUN 30 (H) 10/22/2011 0340   CREATININE 0.94 05/12/2023 1007   CREATININE 1.38 (H) 10/22/2011 0340   CALCIUM 9.3 05/12/2023 1007   CALCIUM 8.1 (L) 10/22/2011 0340   GFRNONAA >60 05/12/2023 1007   GFRNONAA 41 (L) 10/22/2011 0340   GFRAA >60 10/28/2018 0445   GFRAA 47 (L) 10/22/2011 0340    INR    Component Value Date/Time   INR 1.2 10/09/2022 1000     Intake/Output Summary (Last 24 hours) at 05/13/2023 1157 Last data filed at 05/12/2023 1353 Gross per 24 hour  Intake 30 ml  Output --  Net 30 ml     Assessment/Plan:  72 y.o. female who presented to [facility] emergency department with severe headache and symptoms of TIA.  Upon workup patient was noted to have bilateral carotid stenosis greater than 80%.* No surgery date entered *   PLAN: Patient is scheduled with vascular surgery for a right carotid open endarterectomy tomorrow on 05/14/2023.  I discussed again today the procedure benefits risk and complications with the patient.  She verbalizes her understanding again.  She wishes to proceed.  Patient was made n.p.o. after midnight for surgery tomorrow.  DVT prophylaxis: Heparin infusion  I discussed the plan in detail with Dr. Levora Dredge MD and he agrees with the plan   [name] 05/13/2023 11:57 AM

## 2023-05-13 NOTE — Progress Notes (Signed)
SLP Cancellation Note  Patient Details Name: KALISHA PIONTEK MRN: [id number] DOB: [birth year]   Cancelled treatment:       Reason Eval/Treat Not Completed: SLP screened, no needs identified, will sign off (chart reviewed; met w/ pt and Family, then NSG)   Pt denied any difficulty swallowing and is currently on a regular diet; tolerates swallowing pills w/ water per NSG.  Pt conversed in conversation w/out expressive/receptive deficits noted; pt denied any speech-language deficits. Speech clear. Pt requested po items; described her breakfast meal, and indicated she hoped she would have something better for next meal.  No further skilled ST services indicated as pt appears at her communication baseline. Pt agreed. NSG to reconsult if any change in status while admitted.     Jerilynn Som, MS, CCC-SLP Speech Language Pathologist Rehab Services; [facility] [phone] (ascom) Durant Scibilia 05/13/2023, 10:49 AM

## 2023-05-13 NOTE — ED Notes (Signed)
Patient ambulatory to restroom.  Steady gait noted.  No reports of dizziness.

## 2023-05-13 NOTE — Progress Notes (Signed)
PHARMACY - ANTICOAGULATION CONSULT NOTE  Pharmacy Consult for Heparin  Indication:  carotid artery stenosis  / CEA on 12/6   Allergies  Allergen Reactions   Tizanidine     headaches   Venlafaxine Other (See Comments)    intolerant   Codeine Nausea And Vomiting   Tramadol Nausea And Vomiting    Patient Measurements: Height: 5\' 3"  (160 cm) Weight: 68 kg (150 lb) IBW/kg (Calculated) : 52.4 Heparin Dosing Weight: 66.3 kg   Vital Signs: Temp: 98.1 F (36.7 C) (12/05 1756) Temp Source: Oral (12/05 1230) BP: 148/72 (12/05 1756) Pulse Rate: 80 (12/05 1756)  Labs: Recent Labs    05/12/23 1007 05/13/23 0735 05/13/23 1858  HGB 10.9*  --   --   HCT 32.8*  --   --   PLT 148*  --   --   HEPARINUNFRC  --  >1.10* 0.47  CREATININE 0.94  --   --     Estimated Creatinine Clearance: 49.3 mL/min (by C-G formula based on SCr of 0.94 mg/dL).   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 Excell Seltzer & Dr. Laneta Simmers   Severe aortic stenosis     Medications:  Medications Prior to Admission  Medication Sig Dispense Refill Last Dose   acetaminophen (TYLENOL) 500 MG tablet Take 500 mg by mouth every 6 (six) hours as needed for headache or fever.   PRN at PRN   amLODipine (NORVASC) 5 MG tablet Take 5 mg by mouth daily.   05/12/2023 at AM   aspirin EC 81 MG tablet Take 81 mg by mouth daily. Swallow whole.   05/12/2023 at AM   carvedilol (COREG) 6.25 MG tablet Take 6.25 mg by mouth 2 (two) times a day.    05/12/2023 at AM   Cholecalciferol (VITAMIN D3) 50 MCG (2000 UT) TABS Take 2,000 Units by mouth daily.   05/12/2023 at AM   furosemide (LASIX) 20 MG tablet Take 20 mg  by mouth once a week. In the morning   05/11/2023   gabapentin (NEURONTIN) 800 MG tablet Take 800-1,600 mg by mouth See admin instructions. Take 1 tablet (800 mg) by mouth in the morning, 1 tablet (800 mg) by mouth at noon, & take 2 tablets (1600 mg) by mouth at bedtime.   05/12/2023 at AM   potassium chloride SA (KLOR-CON M) 20 MEQ tablet Take 20 mEq by mouth daily.   05/12/2023 at AM   Propylene Glycol (SYSTANE BALANCE) 0.6 % SOLN Place 1 drop into both eyes daily.   05/12/2023   rosuvastatin (CRESTOR) 20 MG tablet Take 20 mg by mouth at bedtime.   05/11/2023 at NIGHT   ACCU-CHEK GUIDE test strip USE TO TEST SUGAR DAILY      Accu-Chek Softclix Lancets lancets daily.      Alpha-Lipoic Acid 600 MG CAPS Take 600 mg by mouth daily.      amoxicillin (AMOXIL) 500 MG tablet Take 4 tablets (2,000 mg total) by mouth as directed. 1 hour prior to dental work including cleanings 12 tablet 12 PRN at PRN   Azelastine HCl 137 MCG/SPRAY SOLN Place  1 spray into the nose 2 (two) times daily as needed (allergies).   PRN at PRN   Blood Glucose Monitoring Suppl (ACCU-CHEK GUIDE ME) w/Device KIT USE AS DIRECTED TO TEST SUGAR DAILY      CINNAMON PO Take 1,000 mg by mouth daily.      [employer] SENNA 8.6 MG tablet TAKE 1 TABLET (8.6 MG TOTAL) BY MOUTH DAILY AS NEEDED FOR MILD CONSTIPATION. 30 tablet 0 PRN at PRN   EPINEPHrine 0.3 mg/0.3 mL IJ SOAJ injection Inject 0.3 mg into the muscle as needed for anaphylaxis.   PRN at PRN   ferrous sulfate ER (SLOW FE) 142 (45 Fe) MG TBCR tablet Take 45 mg by mouth every Monday, Wednesday, and Friday. (Patient not taking: Reported on 05/12/2023)   Not Taking   loratadine (CLARITIN) 10 MG tablet Take 10 mg by mouth daily as needed for allergies.   PRN at PRN   metFORMIN (GLUCOPHAGE-XR) 500 MG 24 hr tablet Take 500 mg by mouth daily with breakfast. (Patient not taking: Reported on 05/12/2023)   Not Taking   nitroGLYCERIN (NITROSTAT) 0.4 MG SL tablet Place 0.4 mg under the tongue every 5 (five)  minutes as needed for chest pain.   PRN at PRN   Omega-3 Fatty Acids (FISH OIL) 1000 MG CAPS Take 1,000 mg by mouth 3 (three) times daily.      triamcinolone cream (KENALOG) 0.5 % Apply 1 Application topically daily as needed (irritation).       Assessment: Pharmacy consulted to dose heparin in this 72 year old female admitted with CVA, carotid artery stenosis.  CEA planned for 12/6.   No anticoag PTA noted.  Pt did receive lovenox 40 mg SQ X 1 on 12/4 @ 2248.    CrCl = 49.3 ml/min   Date/Time HL Rate  Comment 12/5 0735 >1.1 1100 units/hr SUPRAtherapeutic  12/5 1858 0.47 950 units/hr Therapeutic x1  Goal of Therapy:  Heparin level 0.3-0.7 units/ml Monitor platelets by anticoagulation protocol: Yes   Plan:  No issues with infusion reported, no signs/symptoms of bleeding noted Continue heparin infusion at 950 units/hour Check confirmatory heparin level in 8 hours Monitor CBC daily  Thank you for involving pharmacy in this patient's care.   Rockwell Alexandria, PharmD Clinical Pharmacist 05/13/2023 7:56 PM

## 2023-05-13 NOTE — Progress Notes (Signed)
PHARMACY - ANTICOAGULATION CONSULT NOTE  Pharmacy Consult for Heparin  Indication:  carotid artery stenosis  / CEA on 12/6   Allergies  Allergen Reactions   Tizanidine     headaches   Venlafaxine Other (See Comments)    intolerant   Codeine Nausea And Vomiting   Tramadol Nausea And Vomiting    Patient Measurements: Height: 5\' 3"  (160 cm) Weight: 68 kg (150 lb) IBW/kg (Calculated) : 52.4 Heparin Dosing Weight: 66.3 kg   Vital Signs: Temp: 99.1 F (37.3 C) (12/04 2214) Temp Source: Oral (12/04 2214) BP: 119/63 (12/04 2230) Pulse Rate: 69 (12/04 2230)  Labs: Recent Labs    05/12/23 1007  HGB 10.9*  HCT 32.8*  PLT 148*  CREATININE 0.94    Estimated Creatinine Clearance: 49.3 mL/min (by C-G formula based on SCr of 0.94 mg/dL).   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 Excell Seltzer & Dr. Laneta Simmers   Severe aortic stenosis     Medications:  (Not in a hospital admission)   Assessment: Pharmacy consulted to dose heparin in this 72 year old female admitted with CVA, carotid artery stenosis.  CEA planned for 12/6.   No anticoag PTA noted.  Pt did receive lovenox 40 mg SQ X 1 on 12/4 @ 2248.    CrCl = 49.3 ml/min   Goal of Therapy:  Heparin level 0.3-0.7 units/ml Monitor platelets by anticoagulation protocol: Yes   Plan:  Lovenox 40 mg SQ X 1 given on 12/4 @ 2248.  Will not bolus this pt. Start heparin infusion at 1100 units/hr Check anti-Xa level in 8 hours and daily while on heparin Continue to monitor H&H and platelets  Tyreese Thain D 05/13/2023,1:14 AM

## 2023-05-14 ENCOUNTER — Other Ambulatory Visit: Payer: Self-pay

## 2023-05-14 ENCOUNTER — Encounter
Admission: EM | Disposition: A | Discharge status: Home / Self Care | Payer: Self-pay | Source: Home / Self Care | Attending: Internal Medicine

## 2023-05-14 ENCOUNTER — Encounter: Payer: Self-pay | Admitting: Family Medicine

## 2023-05-14 ENCOUNTER — Inpatient Hospital Stay: Payer: No Typology Code available for payment source | Admitting: Anesthesiology

## 2023-05-14 DIAGNOSIS — I6523 Occlusion and stenosis of bilateral carotid arteries: Secondary | ICD-10-CM | POA: Diagnosis not present

## 2023-05-14 DIAGNOSIS — Z8673 Personal history of transient ischemic attack (TIA), and cerebral infarction without residual deficits: Secondary | ICD-10-CM

## 2023-05-14 DIAGNOSIS — I6521 Occlusion and stenosis of right carotid artery: Secondary | ICD-10-CM

## 2023-05-14 HISTORY — PX: ENDARTERECTOMY: SHX5162

## 2023-05-14 LAB — CBC
HCT: 29.7 % — ABNORMAL LOW (ref 36.0–46.0)
Hemoglobin: 10 g/dL — ABNORMAL LOW (ref 12.0–15.0)
MCH: 35.5 pg — ABNORMAL HIGH (ref 26.0–34.0)
MCHC: 33.7 g/dL (ref 30.0–36.0)
MCV: 105.3 fL — ABNORMAL HIGH (ref 80.0–100.0)
Platelets: 141 10*3/uL — ABNORMAL LOW (ref 150–400)
RBC: 2.82 MIL/uL — ABNORMAL LOW (ref 3.87–5.11)
RDW: 14.4 % (ref 11.5–15.5)
WBC: 5 10*3/uL (ref 4.0–10.5)
nRBC: 0 % (ref 0.0–0.2)

## 2023-05-14 LAB — GLUCOSE, CAPILLARY
Glucose-Capillary: 101 mg/dL — ABNORMAL HIGH (ref 70–99)
Glucose-Capillary: 104 mg/dL — ABNORMAL HIGH (ref 70–99)
Glucose-Capillary: 114 mg/dL — ABNORMAL HIGH (ref 70–99)
Glucose-Capillary: 122 mg/dL — ABNORMAL HIGH (ref 70–99)
Glucose-Capillary: 160 mg/dL — ABNORMAL HIGH (ref 70–99)

## 2023-05-14 LAB — HEPARIN LEVEL (UNFRACTIONATED): Heparin Unfractionated: 0.58 [IU]/mL (ref 0.30–0.70)

## 2023-05-14 SURGERY — ENDARTERECTOMY, CAROTID
Anesthesia: General | Laterality: Right

## 2023-05-14 MED ORDER — MORPHINE SULFATE (PF) 2 MG/ML IV SOLN: 2.0000 mg | INTRAVENOUS | Status: DC | PRN

## 2023-05-14 MED ORDER — OXYCODONE HCL 5 MG PO TABS
5.0000 mg | ORAL_TABLET | ORAL | Status: DC | PRN
  Administered 2023-05-14: 5 mg via ORAL
  Filled 2023-05-14: qty 1

## 2023-05-14 MED ORDER — FENTANYL CITRATE (PF) 100 MCG/2ML IJ SOLN
INTRAMUSCULAR | Status: AC
  Filled 2023-05-14: qty 2

## 2023-05-14 MED ORDER — HEPARIN SODIUM (PORCINE) 5000 UNIT/ML IJ SOLN
INTRAMUSCULAR | Status: AC
  Filled 2023-05-14: qty 1

## 2023-05-14 MED ORDER — HYDRALAZINE HCL 20 MG/ML IJ SOLN: 5.0000 mg | INTRAMUSCULAR | Status: DC | PRN

## 2023-05-14 MED ORDER — SUGAMMADEX SODIUM 200 MG/2ML IV SOLN
INTRAVENOUS | Status: DC | PRN
  Administered 2023-05-14: 200 mg via INTRAVENOUS

## 2023-05-14 MED ORDER — ONDANSETRON HCL 4 MG/2ML IJ SOLN
INTRAMUSCULAR | Status: DC | PRN
  Administered 2023-05-14: 4 mg via INTRAVENOUS

## 2023-05-14 MED ORDER — ROCURONIUM BROMIDE 10 MG/ML (PF) SYRINGE
PREFILLED_SYRINGE | INTRAVENOUS | Status: AC
Start: 2023-05-14 — End: ?
  Filled 2023-05-14: qty 10

## 2023-05-14 MED ORDER — CEFAZOLIN SODIUM-DEXTROSE 2-4 GM/100ML-% IV SOLN
INTRAVENOUS | Status: AC
  Filled 2023-05-14: qty 100

## 2023-05-14 MED ORDER — PHENYLEPHRINE HCL-NACL 20-0.9 MG/250ML-% IV SOLN
INTRAVENOUS | Status: DC | PRN
  Administered 2023-05-14: 160 ug via INTRAVENOUS
  Administered 2023-05-14 (×2): 80 ug via INTRAVENOUS
  Administered 2023-05-14: 40 ug/min via INTRAVENOUS
  Administered 2023-05-14: 160 ug via INTRAVENOUS

## 2023-05-14 MED ORDER — ONDANSETRON HCL 4 MG/2ML IJ SOLN: 4.0000 mg | Freq: Four times a day (QID) | INTRAMUSCULAR | Status: DC | PRN

## 2023-05-14 MED ORDER — CHLORHEXIDINE GLUCONATE 4 % EX SOLN: 60.0000 mL | Freq: Once | CUTANEOUS | Status: DC

## 2023-05-14 MED ORDER — LIDOCAINE HCL (PF) 2 % IJ SOLN
INTRAMUSCULAR | Status: AC
  Filled 2023-05-14: qty 5

## 2023-05-14 MED ORDER — ROCURONIUM BROMIDE 100 MG/10ML IV SOLN
INTRAVENOUS | Status: DC | PRN
  Administered 2023-05-14: 50 mg via INTRAVENOUS
  Administered 2023-05-14: 20 mg via INTRAVENOUS

## 2023-05-14 MED ORDER — CEFAZOLIN SODIUM-DEXTROSE 2-4 GM/100ML-% IV SOLN
2.0000 g | Freq: Three times a day (TID) | INTRAVENOUS | Status: AC
  Administered 2023-05-14 – 2023-05-15 (×2): 2 g via INTRAVENOUS
  Filled 2023-05-14 (×2): qty 100

## 2023-05-14 MED ORDER — DEXMEDETOMIDINE HCL IN NACL 200 MCG/50ML IV SOLN
INTRAVENOUS | Status: DC | PRN
  Administered 2023-05-14 (×3): 4 ug via INTRAVENOUS

## 2023-05-14 MED ORDER — FENTANYL CITRATE (PF) 100 MCG/2ML IJ SOLN
INTRAMUSCULAR | Status: DC | PRN
  Administered 2023-05-14 (×3): 50 ug via INTRAVENOUS

## 2023-05-14 MED ORDER — CHLORHEXIDINE GLUCONATE CLOTH 2 % EX PADS
6.0000 | MEDICATED_PAD | Freq: Every day | CUTANEOUS | Status: DC
Start: 2023-05-15 — End: 2023-05-15
  Administered 2023-05-15: 6 via TOPICAL

## 2023-05-14 MED ORDER — HEMOSTATIC AGENTS (NO CHARGE) OPTIME
TOPICAL | Status: DC | PRN
  Administered 2023-05-14: 1 via TOPICAL

## 2023-05-14 MED ORDER — HEPARIN SODIUM (PORCINE) 1000 UNIT/ML IJ SOLN
INTRAMUSCULAR | Status: DC | PRN
  Administered 2023-05-14: 7000 [IU] via INTRAVENOUS

## 2023-05-14 MED ORDER — POTASSIUM CHLORIDE IN NACL 20-0.9 MEQ/L-% IV SOLN
INTRAVENOUS | Status: DC
  Filled 2023-05-14 (×2): qty 1000

## 2023-05-14 MED ORDER — FIBRIN SEALANT 2 ML SINGLE DOSE KIT
PACK | CUTANEOUS | Status: DC | PRN
  Administered 2023-05-14: 4 mL via TOPICAL

## 2023-05-14 MED ORDER — DROPERIDOL 2.5 MG/ML IJ SOLN: 0.6250 mg | Freq: Once | INTRAMUSCULAR | Status: DC | PRN

## 2023-05-14 MED ORDER — HEPARIN SODIUM (PORCINE) 1000 UNIT/ML IJ SOLN
INTRAMUSCULAR | Status: AC
  Filled 2023-05-14: qty 10

## 2023-05-14 MED ORDER — ALUM & MAG HYDROXIDE-SIMETH 200-200-20 MG/5ML PO SUSP: 15.0000 mL | ORAL | Status: DC | PRN

## 2023-05-14 MED ORDER — SODIUM CHLORIDE 0.9 % IV SOLN: 500.0000 mL | Freq: Once | INTRAVENOUS | Status: DC | PRN

## 2023-05-14 MED ORDER — PHENOL 1.4 % MT LIQD: 1.0000 | OROMUCOSAL | Status: DC | PRN

## 2023-05-14 MED ORDER — PHENYLEPHRINE HCL-NACL 20-0.9 MG/250ML-% IV SOLN
INTRAVENOUS | Status: AC
Start: 2023-05-14 — End: ?
  Filled 2023-05-14: qty 250

## 2023-05-14 MED ORDER — SODIUM CHLORIDE 0.9 % IV SOLN
INTRAVENOUS | Status: DC | PRN
  Administered 2023-05-14: 501 mL

## 2023-05-14 MED ORDER — CLOPIDOGREL BISULFATE 75 MG PO TABS
75.0000 mg | ORAL_TABLET | Freq: Every day | ORAL | Status: DC
  Administered 2023-05-15: 75 mg via ORAL
  Filled 2023-05-14: qty 1

## 2023-05-14 MED ORDER — PROPOFOL 10 MG/ML IV BOLUS
INTRAVENOUS | Status: DC | PRN
  Administered 2023-05-14: 150 mg via INTRAVENOUS

## 2023-05-14 MED ORDER — DEXAMETHASONE SODIUM PHOSPHATE 10 MG/ML IJ SOLN
INTRAMUSCULAR | Status: DC | PRN
  Administered 2023-05-14: 10 mg via INTRAVENOUS

## 2023-05-14 MED ORDER — ONDANSETRON HCL 4 MG/2ML IJ SOLN
INTRAMUSCULAR | Status: AC
Start: 2023-05-14 — End: ?
  Filled 2023-05-14: qty 2

## 2023-05-14 MED ORDER — FENTANYL CITRATE (PF) 100 MCG/2ML IJ SOLN
25.0000 ug | INTRAMUSCULAR | Status: DC | PRN
  Administered 2023-05-14 (×4): 25 ug via INTRAVENOUS

## 2023-05-14 MED ORDER — ASPIRIN 81 MG PO TBEC
81.0000 mg | DELAYED_RELEASE_TABLET | Freq: Every day | ORAL | Status: DC
  Administered 2023-05-15: 81 mg via ORAL
  Filled 2023-05-14: qty 1

## 2023-05-14 MED ORDER — DEXAMETHASONE SODIUM PHOSPHATE 10 MG/ML IJ SOLN
INTRAMUSCULAR | Status: AC
Start: 2023-05-14 — End: ?
  Filled 2023-05-14: qty 1

## 2023-05-14 MED ORDER — FIBRIN SEALANT 2 ML SINGLE DOSE KIT
PACK | CUTANEOUS | Status: AC
  Filled 2023-05-14: qty 2

## 2023-05-14 MED ORDER — 0.9 % SODIUM CHLORIDE (POUR BTL) OPTIME
TOPICAL | Status: DC | PRN
  Administered 2023-05-14: 100 mL

## 2023-05-14 MED ORDER — PROPOFOL 10 MG/ML IV BOLUS
INTRAVENOUS | Status: AC
Start: 2023-05-14 — End: ?
  Filled 2023-05-14: qty 20

## 2023-05-14 MED ORDER — PANTOPRAZOLE SODIUM 40 MG IV SOLR
40.0000 mg | Freq: Every day | INTRAVENOUS | Status: DC
  Administered 2023-05-14: 40 mg via INTRAVENOUS
  Filled 2023-05-14: qty 10

## 2023-05-14 MED ORDER — GUAIFENESIN-DM 100-10 MG/5ML PO SYRP: 15.0000 mL | ORAL_SOLUTION | ORAL | Status: DC | PRN

## 2023-05-14 MED ORDER — LIDOCAINE HCL (CARDIAC) PF 100 MG/5ML IV SOSY
PREFILLED_SYRINGE | INTRAVENOUS | Status: DC | PRN
  Administered 2023-05-14: 80 mg via INTRAVENOUS
  Administered 2023-05-14: 20 mg via INTRAVENOUS

## 2023-05-14 MED ORDER — EPHEDRINE SULFATE-NACL 50-0.9 MG/10ML-% IV SOSY
PREFILLED_SYRINGE | INTRAVENOUS | Status: DC | PRN
  Administered 2023-05-14 (×2): 10 mg via INTRAVENOUS
  Administered 2023-05-14: 5 mg via INTRAVENOUS

## 2023-05-14 MED ORDER — METOPROLOL TARTRATE 5 MG/5ML IV SOLN
2.0000 mg | INTRAVENOUS | Status: DC | PRN
Start: 2023-05-14 — End: 2023-05-15

## 2023-05-14 MED ORDER — GLYCOPYRROLATE 0.2 MG/ML IJ SOLN
INTRAMUSCULAR | Status: DC | PRN
  Administered 2023-05-14: .2 mg via INTRAVENOUS

## 2023-05-14 MED ORDER — DOCUSATE SODIUM 100 MG PO CAPS
100.0000 mg | ORAL_CAPSULE | Freq: Every day | ORAL | Status: DC
  Administered 2023-05-15: 100 mg via ORAL
  Filled 2023-05-14: qty 1

## 2023-05-14 SURGICAL SUPPLY — 44 items
BAG DECANTER FOR FLEXI CONT (MISCELLANEOUS) ×1 IMPLANT
BLADE SURG 15 STRL LF DISP TIS (BLADE) ×1 IMPLANT
BLADE SURG SZ11 CARB STEEL (BLADE) ×1 IMPLANT
BRUSH SCRUB EZ 4% CHG (MISCELLANEOUS) ×1 IMPLANT
CLAMP SUTURE YELLOW 5 PAIRS (MISCELLANEOUS) ×2 IMPLANT
DERMABOND ADVANCED .7 DNX12 (GAUZE/BANDAGES/DRESSINGS) ×1 IMPLANT
DRAPE 3/4 80X56 (DRAPES) IMPLANT
DRAPE INCISE IOBAN 66X45 STRL (DRAPES) ×1 IMPLANT
DRAPE LAPAROTOMY 100X77 ABD (DRAPES) ×1 IMPLANT
DRESSING SURGICEL FIBRLLR 1X2 (HEMOSTASIS) ×1 IMPLANT
DRSG SURGICEL FIBRILLAR 1X2 (HEMOSTASIS) ×1
ELECT CAUTERY BLADE 6.4 (BLADE) ×1 IMPLANT
ELECT REM PT RETURN 9FT ADLT (ELECTROSURGICAL) ×1
ELECTRODE REM PT RTRN 9FT ADLT (ELECTROSURGICAL) ×1 IMPLANT
GLOVE BIO SURGEON STRL SZ7 (GLOVE) ×1 IMPLANT
GLOVE SURG SYN 8.0 (GLOVE) ×1 IMPLANT
GLOVE SURG SYN 8.0 PF PI (GLOVE) ×1 IMPLANT
GOWN STRL REUS W/ TWL XL LVL3 (GOWN DISPOSABLE) ×2 IMPLANT
IV NS 500ML BAXH (IV SOLUTION) ×1 IMPLANT
KIT TURNOVER KIT A (KITS) ×1 IMPLANT
LABEL OR SOLS (LABEL) ×1 IMPLANT
LOOP VESSEL MAXI 1X406 RED (MISCELLANEOUS) ×2 IMPLANT
LOOP VESSEL MINI 0.8X406 BLUE (MISCELLANEOUS) ×1 IMPLANT
MANIFOLD NEPTUNE II (INSTRUMENTS) ×1 IMPLANT
NDL FILTER BLUNT 18X1 1/2 (NEEDLE) ×1 IMPLANT
NEEDLE FILTER BLUNT 18X1 1/2 (NEEDLE) ×1 IMPLANT
NS IRRIG 500ML POUR BTL (IV SOLUTION) ×1 IMPLANT
PACK BASIN MAJOR ARMC (MISCELLANEOUS) ×1 IMPLANT
PATCH VASC XENOSURE .8CM X 8CM (Vascular Products) IMPLANT
SET WALTER ACTIVATION W/DRAPE (SET/KITS/TRAYS/PACK) IMPLANT
SHUNT CAROTID STR REINF 3.0X4. (MISCELLANEOUS) ×1 IMPLANT
SUT MNCRL+ 5-0 UNDYED PC-3 (SUTURE) ×1 IMPLANT
SUT PROLENE 6 0 BV (SUTURE) ×8 IMPLANT
SUT PROLENE 7 0 BV 1 (SUTURE) ×6 IMPLANT
SUT SILK 2-0 18XBRD TIE 12 (SUTURE) ×1 IMPLANT
SUT SILK 3-0 18XBRD TIE 12 (SUTURE) ×1 IMPLANT
SUT VIC AB 3-0 SH 27X BRD (SUTURE) ×1 IMPLANT
SYR 20ML LL LF (SYRINGE) ×1 IMPLANT
SYR 3ML LL SCALE MARK (SYRINGE) ×1 IMPLANT
TAG SUTURE CLAMP YLW 5PR (MISCELLANEOUS) ×2
TRAP FLUID SMOKE EVACUATOR (MISCELLANEOUS) ×1 IMPLANT
TRAY FOLEY MTR SLVR 16FR STAT (SET/KITS/TRAYS/PACK) ×1 IMPLANT
VASC PATCH XENOSURE .8CM X 8CM (Vascular Products) ×1 IMPLANT
WATER STERILE IRR 500ML POUR (IV SOLUTION) ×1 IMPLANT

## 2023-05-14 NOTE — Anesthesia Procedure Notes (Signed)
Procedure Name: Intubation Date/Time: 05/14/2023 12:00 PM  Performed by: Reece Agar, CRNAPre-anesthesia Checklist: Patient identified, Emergency Drugs available, Suction available and Patient being monitored Patient Re-evaluated:Patient Re-evaluated prior to induction Oxygen Delivery Method: Circle system utilized Preoxygenation: Pre-oxygenation with 100% oxygen Induction Type: IV induction Ventilation: Mask ventilation without difficulty Laryngoscope Size: McGrath and 3 Grade View: Grade I Tube type: Oral Tube size: 7.0 mm Number of attempts: 1 Airway Equipment and Method: Stylet and LTA kit utilized Placement Confirmation: ETT inserted through vocal cords under direct vision, positive ETCO2 and breath sounds checked- equal and bilateral Secured at: 21 cm Tube secured with: Tape Dental Injury: Teeth and Oropharynx as per pre-operative assessment

## 2023-05-14 NOTE — Plan of Care (Signed)
  Problem: Education: Goal: Knowledge of General Education information will improve Description Including pain rating scale, medication(s)/side effects and non-pharmacologic comfort measures Outcome: Progressing   

## 2023-05-14 NOTE — Interval H&P Note (Signed)
History and Physical Interval Note:  05/14/2023 10:53 AM  Renee [name]  has presented today for surgery, with the diagnosis of Right Carotid Stenosis.  The various methods of treatment have been discussed with the patient and family. After consideration of risks, benefits and other options for treatment, the patient has consented to  Procedure(s): ENDARTERECTOMY CAROTID (Right) as a surgical intervention.  The patient's history has been reviewed, patient examined, no change in status, stable for surgery.  I have reviewed the patient's chart and labs.  Questions were answered to the patient's satisfaction.     Levora Dredge

## 2023-05-14 NOTE — Progress Notes (Signed)
PHARMACY - ANTICOAGULATION CONSULT NOTE  Pharmacy Consult for Heparin  Indication:  carotid artery stenosis  / CEA on 12/6   Allergies  Allergen Reactions   Tizanidine     headaches   Venlafaxine Other (See Comments)    intolerant   Codeine Nausea And Vomiting   Tramadol Nausea And Vomiting    Patient Measurements: Height: 5\' 3"  (160 cm) Weight: 68 kg (150 lb) IBW/kg (Calculated) : 52.4 Heparin Dosing Weight: 66.3 kg   Vital Signs: Temp: 98.5 F (36.9 C) (12/06 0406) Temp Source: Oral (12/05 2015) BP: 144/63 (12/06 0406) Pulse Rate: 67 (12/06 0406)  Labs: Recent Labs    05/12/23 1007 05/13/23 0735 05/13/23 1858 05/14/23 0458  HGB 10.9*  --   --  10.0*  HCT 32.8*  --   --  29.7*  PLT 148*  --   --  141*  HEPARINUNFRC  --  >1.10* 0.47 0.58  CREATININE 0.94  --   --   --     Estimated Creatinine Clearance: 49.3 mL/min (by C-G formula based on SCr of 0.94 mg/dL).   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 Excell Seltzer & Dr. Laneta Simmers   Severe aortic stenosis     Medications:  Medications Prior to Admission  Medication Sig Dispense Refill Last Dose   acetaminophen (TYLENOL) 500 MG tablet Take 500 mg by mouth every 6 (six) hours as needed for headache or fever.   PRN at PRN   amLODipine (NORVASC) 5 MG tablet Take 5 mg by mouth daily.   05/12/2023 at AM   aspirin EC 81 MG tablet Take 81 mg by mouth daily. Swallow whole.   05/12/2023 at AM   carvedilol (COREG) 6.25 MG tablet Take 6.25 mg by mouth 2 (two) times a day.    05/12/2023 at AM   Cholecalciferol (VITAMIN D3) 50 MCG (2000 UT) TABS Take 2,000 Units by mouth daily.   05/12/2023 at AM    furosemide (LASIX) 20 MG tablet Take 20 mg by mouth once a week. In the morning   05/11/2023   gabapentin (NEURONTIN) 800 MG tablet Take 800-1,600 mg by mouth See admin instructions. Take 1 tablet (800 mg) by mouth in the morning, 1 tablet (800 mg) by mouth at noon, & take 2 tablets (1600 mg) by mouth at bedtime.   05/12/2023 at AM   potassium chloride SA (KLOR-CON M) 20 MEQ tablet Take 20 mEq by mouth daily.   05/12/2023 at AM   Propylene Glycol (SYSTANE BALANCE) 0.6 % SOLN Place 1 drop into both eyes daily.   05/12/2023   rosuvastatin (CRESTOR) 20 MG tablet Take 20 mg by mouth at bedtime.   05/11/2023 at NIGHT   ACCU-CHEK GUIDE test strip USE TO TEST SUGAR DAILY      Accu-Chek Softclix Lancets lancets daily.      Alpha-Lipoic Acid 600 MG CAPS Take 600 mg by mouth daily.      amoxicillin (AMOXIL) 500 MG tablet Take 4 tablets (2,000 mg total) by mouth as directed. 1 hour prior to dental work including cleanings 12 tablet 12 PRN at  PRN   Azelastine HCl 137 MCG/SPRAY SOLN Place 1 spray into the nose 2 (two) times daily as needed (allergies).   PRN at PRN   Blood Glucose Monitoring Suppl (ACCU-CHEK GUIDE ME) w/Device KIT USE AS DIRECTED TO TEST SUGAR DAILY      CINNAMON PO Take 1,000 mg by mouth daily.      [employer] SENNA 8.6 MG tablet TAKE 1 TABLET (8.6 MG TOTAL) BY MOUTH DAILY AS NEEDED FOR MILD CONSTIPATION. 30 tablet 0 PRN at PRN   EPINEPHrine 0.3 mg/0.3 mL IJ SOAJ injection Inject 0.3 mg into the muscle as needed for anaphylaxis.   PRN at PRN   ferrous sulfate ER (SLOW FE) 142 (45 Fe) MG TBCR tablet Take 45 mg by mouth every Monday, Wednesday, and Friday. (Patient not taking: Reported on 05/12/2023)   Not Taking   loratadine (CLARITIN) 10 MG tablet Take 10 mg by mouth daily as needed for allergies.   PRN at PRN   metFORMIN (GLUCOPHAGE-XR) 500 MG 24 hr tablet Take 500 mg by mouth daily with breakfast. (Patient not taking: Reported on 05/12/2023)   Not Taking   nitroGLYCERIN (NITROSTAT) 0.4 MG SL tablet Place  0.4 mg under the tongue every 5 (five) minutes as needed for chest pain.   PRN at PRN   Omega-3 Fatty Acids (FISH OIL) 1000 MG CAPS Take 1,000 mg by mouth 3 (three) times daily.      triamcinolone cream (KENALOG) 0.5 % Apply 1 Application topically daily as needed (irritation).       Assessment: Pharmacy consulted to dose heparin in this 72 year old female admitted with CVA, carotid artery stenosis.  CEA planned for 12/6.   No anticoag PTA noted.  Pt did receive lovenox 40 mg SQ X 1 on 12/4 @ 2248.    CrCl = 49.3 ml/min   Date/Time HL Rate  Comment 12/5 0735 >1.1 1100 units/hr SUPRAtherapeutic  12/5 1858 0.47 950 units/hr Therapeutic x1 12/6 0458        0.58    950 units/hr     Therapeutic X 2   Goal of Therapy:  Heparin level 0.3-0.7 units/ml Monitor platelets by anticoagulation protocol: Yes   Plan:  No issues with infusion reported, no signs/symptoms of bleeding noted Continue heparin infusion at 950 units/hour Check confirmatory heparin level on 12/7 with AM labs.  Monitor CBC daily  Thank you for involving pharmacy in this patient's care.   Jeanpaul Biehl D Clinical Pharmacist 05/14/2023 6:38 AM

## 2023-05-14 NOTE — Plan of Care (Signed)
  Problem: Education: Goal: Knowledge of disease or condition will improve 05/14/2023 0301 by Rise Patience, RN Outcome: Progressing 05/14/2023 0251 by Rise Patience, RN Outcome: Progressing   Problem: Ischemic Stroke/TIA Tissue Perfusion: Goal: Complications of ischemic stroke/TIA will be minimized 05/14/2023 0301 by Rise Patience, RN Outcome: Progressing 05/14/2023 0251 by Rise Patience, RN Outcome: Progressing   Problem: Coping: Goal: Will verbalize positive feelings about self 05/14/2023 0301 by Rise Patience, RN Outcome: Progressing 05/14/2023 0251 by Rise Patience, RN Outcome: Progressing   Problem: Health Behavior/Discharge Planning: Goal: Ability to manage health-related needs will improve Outcome: Progressing   Problem: Self-Care: Goal: Ability to participate in self-care as condition permits will improve 05/14/2023 0301 by Rise Patience, RN Outcome: Progressing 05/14/2023 0251 by Rise Patience, RN Outcome: Progressing   Problem: Nutrition: Goal: Risk of aspiration will decrease Outcome: Progressing   Problem: Education: Goal: Ability to describe self-care measures that may prevent or decrease complications (Diabetes Survival Skills Education) will improve 05/14/2023 0301 by Rise Patience, RN Outcome: Progressing 05/14/2023 0251 by Rise Patience, RN Outcome: Progressing   Problem: Coping: Goal: Ability to adjust to condition or change in health will improve 05/14/2023 0301 by Rise Patience, RN Outcome: Progressing 05/14/2023 0251 by Rise Patience, RN Outcome: Progressing   Problem: Health Behavior/Discharge Planning: Goal: Ability to identify and utilize available resources and services will improve Outcome: Progressing   Problem: Metabolic: Goal: Ability to maintain appropriate glucose levels will improve 05/14/2023 0301 by Rise Patience, RN Outcome: Progressing 05/14/2023 0251 by Rise Patience, RN Outcome: Progressing   Problem:  Nutritional: Goal: Maintenance of adequate nutrition will improve Outcome: Progressing   Problem: Skin Integrity: Goal: Risk for impaired skin integrity will decrease 05/14/2023 0301 by Rise Patience, RN Outcome: Progressing 05/14/2023 0251 by Rise Patience, RN Outcome: Progressing   Problem: Tissue Perfusion: Goal: Adequacy of tissue perfusion will improve Outcome: Progressing   Problem: Activity: Goal: Risk for activity intolerance will decrease Outcome: Progressing   Problem: Nutrition: Goal: Adequate nutrition will be maintained Outcome: Progressing   Problem: Coping: Goal: Level of anxiety will decrease 05/14/2023 0301 by Rise Patience, RN Outcome: Progressing 05/14/2023 0251 by Rise Patience, RN Outcome: Progressing   Problem: Elimination: Goal: Will not experience complications related to bowel motility 05/14/2023 0301 by Rise Patience, RN Outcome: Progressing 05/14/2023 0251 by Rise Patience, RN Outcome: Progressing   Problem: Pain Management: Goal: General experience of comfort will improve 05/14/2023 0301 by Rise Patience, RN Outcome: Progressing 05/14/2023 0251 by Rise Patience, RN Outcome: Progressing   Problem: Safety: Goal: Ability to remain free from injury will improve 05/14/2023 0301 by Rise Patience, RN Outcome: Progressing 05/14/2023 0251 by Rise Patience, RN Outcome: Progressing   Problem: Skin Integrity: Goal: Risk for impaired skin integrity will decrease 05/14/2023 0301 by Rise Patience, RN Outcome: Progressing 05/14/2023 0251 by Rise Patience, RN Outcome: Progressing

## 2023-05-14 NOTE — Op Note (Signed)
[facility]   OPERATIVE NOTE  PROCEDURE:   1.  Right carotid endarterectomy with bovine pericardial arterial patch reconstruction  PRE-OPERATIVE DIAGNOSIS: 1.  Critical carotid stenosis 2.  TIA with left-sided numbness  POST-OPERATIVE DIAGNOSIS: same as above   SURGEON: Renford Dills, MD  ASSISTANT(S): Rolla Plate, NP  ANESTHESIA: general  ESTIMATED BLOOD LOSS: 50 cc  FINDING(S): 1.  Extensive calcified carotid plaque.  SPECIMEN(S):  Carotid plaque (sent to Pathology)  INDICATIONS:   IVIE KERBOW is a 72 y.o. y.o. female who presents with right carotid stenosis of 75-80%.  The risks, benefits, and alternatives to carotid endarterectomy were discussed with the patient. The differences between carotid stenting and carotid endarterectomy were reviewed.  The patient voiced understanding and appears to be aware that the risks of carotid endarterectomy include but are not limited to: bleeding, infection, stroke, myocardial infarction, death, cranial nerve injuries both temporary and permanent, neck hematoma, possible airway compromise, labile blood pressure post-operatively, cerebral hyperperfusion syndrome, and possible need for additional interventions in the future. The patient is aware of the risks and agrees to proceed forward with the procedure.  DESCRIPTION: After full informed written consent was obtained from the patient, the patient was brought back to the operating room and placed supine upon the operating table.  Prior to induction, the patient received IV antibiotics.  After obtaining adequate anesthesia, the patient was placed a supine position with a shoulder roll in place and the patient's neck slightly hyperextended and rotated away from the surgical site.  The patient was prepped in the standard fashion for a carotid endarterectomy.    A first assistant was required to provide a safe and appropriate environment for executing the surgery.  The  assistant was integral in providing retraction, exposure, running suture providing suction and in the closing process.  The incision was made anterior to the sternocleidomastoid muscle and dissected down through the subcutaneous tissue.  The platysmas was opened with electrocautery.  The internal jugular vein and facial vein were identified.  The facial vein is ligated and divided between 2-0 silk ties.  The omohyoid was identified in the common carotid artery exposed at this level. The dissection was there in carried out along the carotid artery in a cranial direction.  The dissection was then carried along periadventitial plane along the common carotid artery up to the bifurcation. The external carotid artery was identified. Vessel loops were then placed around the external carotid artery as well as the superior thyroid artery. In the process of this dissection, the hypoglossal nerve was identified and protected from harm.  The internal carotid artery was then dissected circumferentially just beyond an area in the internal carotid artery distal to the plaque.    At this point, we gave the patient 7000 units of intravenous heparin.  After this was allowed to circulate for several minutes, the common carotid followed by the external carotid and then the internal carotid artery were clamped.  Arteriotomy was made in the common carotid artery with a 11 blade, and extended the arteriotomy with a Potts scissor down into the common carotid artery, then the arteriotomy was carried through the bifurcation into the internal carotid artery until I reached an area that was not diseased.  At this point, a Sundt shunt was placed.  The endarterectomy was begun in the common carotid artery with a Runner, broadcasting/film/video and carried this dissection down into the common carotid artery circumferentially.  Then I transected the plaque at  a segment where it was adherent and transected the plaque with Potts scissors.  I then carried  this dissection up into the external carotid artery.  The plaque was extracted by unclamping the external carotid artery and performing an eversion endarterectomy.  The dissection was then carried into the internal carotid artery where a  feathered end point was created.  The plaque was passed off the field as a specimen.  The distal endpoint was tacked down with 6 interrupted 7-0 Prolene sutures.  A bovine pericardial arterial patch was delivered onto the field and trimmed appropriately for the artery and sewed in place with 6-0 Prolene using a 4 quadrant technique.  The medial suture line was completed and the lateral suture line was run approximately one quarter the length of the arteriotomy.  Prior to completing this patch angioplasty, the shunt was removed, the internal carotid artery was flushed and there was excellent backbleeding.  The carotid artery repair was flushed with heparinized saline and then the patch angioplasty was completed in the usual fashion.  The flow was then reestablished first to the external carotid artery and then the internal carotid artery to prevent distal embolization.   Several minutes of pressure were held and 6-0 Prolene patch sutures were used as need for hemostasis.  At this point, I placed Surgicel and Evicel topical hemostatic agents.  There was no more active bleeding in the surgical site.  The sternocleidomastoid space was closed with three interrupted 3-0 Vicryl sutures. I then reapproximated the platysma muscle with a running stitch of 3-0 Vicryl.  The skin was then closed with a running subcuticular 4-0 Monocryl.  The skin was then cleaned, dried and Dermabond was used to reinforce the skin closure.  The patient awakened and was taken to the recovery room in stable condition, following commands and moving all four extremities without any apparent deficits.    COMPLICATIONS: none  CONDITION: stable  Levora Dredge 05/14/2023<2:26 PM

## 2023-05-14 NOTE — Transfer of Care (Signed)
Immediate Anesthesia Transfer of Care Note  Patient: Renee [name]  Procedure(s) Performed: ENDARTERECTOMY CAROTID (Right)  Patient Location: PACU  Anesthesia Type:General  Level of Consciousness: awake, drowsy, and pateint uncooperative  Airway & Oxygen Therapy: Patient Spontanous Breathing  Post-op Assessment: Report given to RN and Post -op Vital signs reviewed and stable  Post vital signs: Reviewed and stable  Last Vitals:  Vitals Value Taken Time  BP 154/84 05/14/23 1453  Temp    Pulse 81 05/14/23 1453  Resp 17 05/14/23 1453  SpO2 98 % 05/14/23 1453  Vitals shown include unfiled device data.  Last Pain:  Vitals:   05/14/23 1016  TempSrc:   PainSc: 0-No pain         Complications: No notable events documented.

## 2023-05-14 NOTE — Plan of Care (Signed)
  Problem: Education: Goal: Knowledge of disease or condition will improve Outcome: Progressing   Problem: Ischemic Stroke/TIA Tissue Perfusion: Goal: Complications of ischemic stroke/TIA will be minimized Outcome: Progressing   Problem: Coping: Goal: Will verbalize positive feelings about self Outcome: Progressing   Problem: Self-Care: Goal: Ability to participate in self-care as condition permits will improve Outcome: Progressing   Problem: Nutrition: Goal: Risk of aspiration will decrease Outcome: Progressing   Problem: Education: Goal: Ability to describe self-care measures that may prevent or decrease complications (Diabetes Survival Skills Education) will improve Outcome: Progressing   Problem: Coping: Goal: Ability to adjust to condition or change in health will improve Outcome: Progressing   Problem: Health Behavior/Discharge Planning: Goal: Ability to identify and utilize available resources and services will improve Outcome: Progressing   Problem: Metabolic: Goal: Ability to maintain appropriate glucose levels will improve Outcome: Progressing   Problem: Skin Integrity: Goal: Risk for impaired skin integrity will decrease Outcome: Progressing   Problem: Tissue Perfusion: Goal: Adequacy of tissue perfusion will improve Outcome: Progressing   Problem: Activity: Goal: Risk for activity intolerance will decrease Outcome: Progressing   Problem: Nutrition: Goal: Adequate nutrition will be maintained Outcome: Progressing   Problem: Coping: Goal: Level of anxiety will decrease Outcome: Progressing   Problem: Elimination: Goal: Will not experience complications related to bowel motility Outcome: Progressing   Problem: Pain Management: Goal: General experience of comfort will improve Outcome: Progressing   Problem: Safety: Goal: Ability to remain free from injury will improve Outcome: Progressing   Problem: Skin Integrity: Goal: Risk for impaired  skin integrity will decrease Outcome: Progressing

## 2023-05-14 NOTE — Anesthesia Preprocedure Evaluation (Signed)
Anesthesia Evaluation  Patient identified by MRN, date of birth, ID band Patient awake    Reviewed: Allergy & Precautions, NPO status , Patient's Chart, lab work & pertinent test results  History of Anesthesia Complications Negative for: history of anesthetic complications  Airway Mallampati: III  TM Distance: >3 FB Neck ROM: full    Dental  (+) Chipped, Poor Dentition, Missing, Dental Advidsory Given   Pulmonary shortness of breath and with exertion, COPD, neg recent URI, Current Smoker and Patient abstained from smoking.   Pulmonary exam normal        Cardiovascular Exercise Tolerance: Good hypertension, (-) angina + CAD (s/p CABG) and + CABG  (-) Past MI (-) dysrhythmias + Valvular Problems/Murmurs (severe AS, now s/p TAVR) AS  + Systolic murmurs ECG 07/26/22: Sinus rhythm LVH with secondary repolarization abnormality  Echo 03/10/22:  NORMAL LEFT VENTRICULAR SYSTOLIC FUNCTION WITH MILD LVH NORMAL RIGHT VENTRICULAR SYSTOLIC FUNCTION MILD VALVULAR REGURGITATION  MODERATE VALVULAR STENOSIS  AVA(VTI)= .73cm^2 MILD MR, TR, AR MODERATE to SEVERE AS TRIVIAL PR EF >55%    Neuro/Psych neg Seizures TIA Neuromuscular disease (diabetic polyneuropathy)  negative psych ROS   GI/Hepatic Neg liver ROS,GERD  Controlled,,  Endo/Other  diabetes, Type 2    Renal/GU Renal disease (stage III CKD)  negative genitourinary   Musculoskeletal  (+) Arthritis ,    Abdominal   Peds  Hematology  (+) Blood dyscrasia, anemia   Anesthesia Other Findings Past Medical History: No date: Arthritis No date: Chronic kidney disease     Comment:  stage 3 No date: Coronary artery disease No date: Diabetes mellitus without complication (HCC) No date: GERD (gastroesophageal reflux disease)     Comment:  no meds No date: Hypertension 10/13/2022: S/P TAVR (transcatheter aortic valve replacement)     Comment:  s/p TAVR with a 23 mm Edwards S3UR via  the TF approach               by Dr. Excell Seltzer & Dr. Laneta Simmers No date: Severe aortic stenosis   Reproductive/Obstetrics negative OB ROS                             Anesthesia Physical Anesthesia Plan  ASA: 4  Anesthesia Plan: General   Post-op Pain Management:    Induction: Intravenous  PONV Risk Score and Plan: Ondansetron, Dexamethasone and Treatment may vary due to age or medical condition  Airway Management Planned: Oral ETT  Additional Equipment:   Intra-op Plan:   Post-operative Plan: Extubation in OR  Informed Consent:      Dental Advisory Given  Plan Discussed with: Anesthesiologist, CRNA and Surgeon  Anesthesia Plan Comments:         Anesthesia Quick Evaluation

## 2023-05-14 NOTE — Progress Notes (Addendum)
Progress Note    Renee [name]  MWU:[id number] DOB: 05-Mar-1951  DOA: 05/12/2023 PCP: Lynnea Ferrier, MD      Brief Narrative:    Medical records reviewed and are as summarized below:  Renee [name] is a 72 y.o. female with medical history significant of stage III CKD, CAD status post stenting, aortic stenosis status post TAVR, hypertension, type 2 diabetes, who presented to the hospital with headache, numbness in the left arm and left leg.  Last known normal was around 11 PM on 05/12/2023.  She woke up around 1:30 AM and 05/13/2023 with her symptoms.  She said she had left knee replacement some years ago and since then she has been having some numbness extending from the left knee to the left foot.  She also has carpal tunnel syndrome which causes intermittent numbness in her hands.  Her main concern was the severe headache that she had at home.  However, by the time she got to the ED, headache had improved.   Workup was negative for acute stroke.  However, she was found to have bilateral internal carotid artery stenosis that is more severe on the left.         Assessment/Plan:   Principal Problem:   Internal carotid artery stenosis, bilateral Active Problems:   TIA (transient ischemic attack)   Stage 3a chronic kidney disease (CKD) (HCC)   Type 2 diabetes mellitus with diabetic polyneuropathy, without long-term current use of insulin (HCC)   Essential hypertension   Hx of CABG   S/P TAVR (transcatheter aortic valve replacement)   Tobacco abuse    Body mass index is 26.57 kg/m.    Proximal left ICA 70% stenosis, proximal right ICA 50% stenosis: Plan for right carotid stent placement today.  IV heparin drip has been held for surgery.  Follow-up with vascular surgeon for further recommendations.  2D echo showed EF estimate 30 to 65%, grade 1 diastolic dysfunction, moderate TR.   Left-sided numbness and headache: Improved.  It is not clear whether patient had a TIA.   Of note, patient also has moderate cervical spondylosis, carpal tunnel syndrome and history of left knee replacement that causes intermittent numbness in her upper and lower extremities   Hypertension: Continue amlodipine and carvedilol   Type II DM with polyneuropathy: NovoLog as needed for hyperglycemia.  Continue gabapentin.  Hemoglobin A1c was 5.5. Lipid panel showed LDL 65, HDL 57, total cholesterol 725, triglycerides 76.   S/p TAVR for aortic stenosis in May 2024.   CAD s/p CABG: Cardiac catheterization in April 2024 showed widely patent coronary arteries and patent LIMA to LAD graft.   Tobacco use disorder: Counseled to quit smoking cigarettes.   Probable CKD stage IIIa.  Of note, patient has fluctuating GFR.  Diet Order             Diet NPO time specified Except for: Sips with Meds  Diet effective midnight                            Consultants: Vascular surgeon  Procedures: None    Medications:     stroke: early stages of recovery book   Does not apply Once   amLODipine  5 mg Oral Daily   aspirin EC  81 mg Oral Daily   carvedilol  6.25 mg Oral BID WC   gabapentin  1,600 mg Oral QHS   gabapentin  800 mg  Oral q morning   gabapentin  800 mg Oral Q1200   insulin aspart  0-9 Units Subcutaneous TID WC   nicotine  21 mg Transdermal Daily   rosuvastatin  20 mg Oral QHS   Continuous Infusions:  sodium chloride 75 mL/hr at 05/14/23 0104    ceFAZolin (ANCEF) IV     heparin 950 Units/hr (05/14/23 0241)     Anti-infectives (From admission, onward)    Start     Dose/Rate Route Frequency Ordered Stop   05/13/23 2323  ceFAZolin (ANCEF) IVPB 2g/100 mL premix        2 g 200 mL/hr over 30 Minutes Intravenous 30 min pre-op 05/13/23 2323                Family Communication/Anticipated D/C date and plan/Code Status   DVT prophylaxis:      Code Status: Full Code  Family Communication: Plan discussed with Talbert Forest, sister, at the  bedside Disposition Plan: Plan to discharge home   Status is: Inpatient Remains inpatient appropriate because: Severe right carotid artery stenosis       Subjective:   Interval is noted.  She has no complaints.  No numbness or weakness in the upper or lower extremities.  No headache or dizziness.  Family member was sleeping in the recliner at the bedside.  Objective:    Vitals:   05/13/23 2015 05/13/23 2341 05/14/23 0406 05/14/23 0744  BP: 139/66 (!) 147/64 (!) 144/63 (!) 140/62  Pulse: 73 68 67 71  Resp: 18 18 18 18   Temp: 98.1 F (36.7 C) 98.7 F (37.1 C) 98.5 F (36.9 C) 98.5 F (36.9 C)  TempSrc: Oral     SpO2: 99% 97% 99% 100%  Weight:      Height:       No data found.  No intake or output data in the 24 hours ending 05/14/23 0843  Filed Weights   05/12/23 0942  Weight: 68 kg    Exam:  GEN: NAD SKIN: Warm and dry EYES: No pallor or icterus ENT: MMM CV: RRR PULM: CTA B ABD: soft, ND, NT, +BS CNS: AAO x 3, non focal EXT: No edema or tenderness        Data Reviewed:   I have personally reviewed following labs and imaging studies:  Labs: Labs show the following:   Basic Metabolic Panel: Recent Labs  Lab 05/12/23 1007  NA 138  K 3.8  CL 108  CO2 22  GLUCOSE 121*  BUN 22  CREATININE 0.94  CALCIUM 9.3  MG 2.3   GFR Estimated Creatinine Clearance: 49.3 mL/min (by C-G formula based on SCr of 0.94 mg/dL). Liver Function Tests: No results for input(s): "AST", "ALT", "ALKPHOS", "BILITOT", "PROT", "ALBUMIN" in the last 168 hours. No results for input(s): "LIPASE", "AMYLASE" in the last 168 hours. No results for input(s): "AMMONIA" in the last 168 hours. Coagulation profile No results for input(s): "INR", "PROTIME" in the last 168 hours.  CBC: Recent Labs  Lab 05/12/23 1007 05/14/23 0458  WBC 5.0 5.0  NEUTROABS 2.7  --   HGB 10.9* 10.0*  HCT 32.8* 29.7*  MCV 106.5* 105.3*  PLT 148* 141*   Cardiac Enzymes: No results for  input(s): "CKTOTAL", "CKMB", "CKMBINDEX", "TROPONINI" in the last 168 hours. BNP (last 3 results) No results for input(s): "PROBNP" in the last 8760 hours. CBG: Recent Labs  Lab 05/13/23 0749 05/13/23 1250 05/13/23 1753 05/13/23 2143 05/14/23 0746  GLUCAP 85 97 110* 101* 101*   D-Dimer:  No results for input(s): "DDIMER" in the last 72 hours. Hgb A1c: Recent Labs    05/12/23 1402  HGBA1C 5.5   Lipid Profile: Recent Labs    05/13/23 0735  CHOL 137  HDL 57  LDLCALC 65  TRIG 76  CHOLHDL 2.4   Thyroid function studies: No results for input(s): "TSH", "T4TOTAL", "T3FREE", "THYROIDAB" in the last 72 hours.  Invalid input(s): "FREET3" Anemia work up: Recent Labs    05/13/23 1858  VITAMINB12 612   Sepsis Labs: Recent Labs  Lab 05/12/23 1007 05/14/23 0458  WBC 5.0 5.0    Microbiology No results found for this or any previous visit (from the past 240 hour(s)).  Procedures and diagnostic studies:  ECHOCARDIOGRAM COMPLETE  Result Date: 05/13/2023    ECHOCARDIOGRAM REPORT   Patient Name:   Renee [name] Date of Exam: 05/13/2023 Medical Rec #:  [id number]     Height:       63.0 in Accession #:    [id number]    Weight:       150.0 lb Date of Birth:  1950-09-29     BSA:          1.711 m Patient Age:    72 years      BP:           151/52 mmHg Patient Gender: F             HR:           65 bpm. Exam Location:  [facility] Procedure: 2D Echo, Color Doppler, Cardiac Doppler and Saline Contrast Bubble            Study Indications:     TIA G45.9  History:         Patient has prior history of Echocardiogram examinations, most                  recent 11/18/2022. Risk Factors:Hypertension and Diabetes. S/P                  TAVR.  Sonographer:     Cristela Blue Referring Phys:  [id number] Francoise Schaumann [place] Diagnosing Phys: Julien Nordmann MD IMPRESSIONS  1. Left ventricular ejection fraction, by estimation, is 60 to 65%. The left ventricle has normal function. The left ventricle has no regional wall motion  abnormalities. There is mild left ventricular hypertrophy. Left ventricular diastolic parameters are consistent with Grade I diastolic dysfunction (impaired relaxation).  2. Right ventricular systolic function is normal. The right ventricular size is normal. There is mildly elevated pulmonary artery systolic pressure. The estimated right ventricular systolic pressure is 42.9 mmHg.  3. The mitral valve is normal in structure. Mild mitral valve regurgitation. No evidence of mitral stenosis.  4. Tricuspid valve regurgitation is moderate.  5. The aortic valve is normal in structure. Aortic valve regurgitation is not visualized. No aortic stenosis is present.  6. The inferior vena cava is normal in size with greater than 50% respiratory variability, suggesting right atrial pressure of 3 mmHg.  7. Agitated saline contrast bubble study was negative, with no evidence of any interatrial shunt. FINDINGS  Left Ventricle: Left ventricular ejection fraction, by estimation, is 60 to 65%. The left ventricle has normal function. The left ventricle has no regional wall motion abnormalities. The left ventricular internal cavity size was normal in size. There is  mild left ventricular hypertrophy. Left ventricular diastolic parameters are consistent with Grade I diastolic dysfunction (impaired relaxation). Right Ventricle: The right ventricular size is  normal. No increase in right ventricular wall thickness. Right ventricular systolic function is normal. There is mildly elevated pulmonary artery systolic pressure. The tricuspid regurgitant velocity is 3.08  m/s, and with an assumed right atrial pressure of 5 mmHg, the estimated right ventricular systolic pressure is 42.9 mmHg. Left Atrium: Left atrial size was normal in size. Right Atrium: Right atrial size was normal in size. Pericardium: There is no evidence of pericardial effusion. Mitral Valve: The mitral valve is normal in structure. Mild mitral valve regurgitation. No evidence  of mitral valve stenosis. MV peak gradient, 5.5 mmHg. The mean mitral valve gradient is 2.0 mmHg. Tricuspid Valve: The tricuspid valve is normal in structure. Tricuspid valve regurgitation is moderate . No evidence of tricuspid stenosis. Aortic Valve: The aortic valve is normal in structure. Aortic valve regurgitation is not visualized. No aortic stenosis is present. Aortic valve mean gradient measures 5.0 mmHg. Aortic valve peak gradient measures 9.5 mmHg. Aortic valve area, by VTI measures 3.18 cm. Pulmonic Valve: The pulmonic valve was normal in structure. Pulmonic valve regurgitation is not visualized. No evidence of pulmonic stenosis. Aorta: The aortic root is normal in size and structure. Venous: The inferior vena cava is normal in size with greater than 50% respiratory variability, suggesting right atrial pressure of 3 mmHg. IAS/Shunts: No atrial level shunt detected by color flow Doppler. Agitated saline contrast was given intravenously to evaluate for intracardiac shunting. Agitated saline contrast bubble study was negative, with no evidence of any interatrial shunt. There  is no evidence of a patent foramen ovale. There is no evidence of an atrial septal defect.  LEFT VENTRICLE PLAX 2D LVIDd:         4.20 cm   Diastology LVIDs:         2.60 cm   LV e' medial:    5.66 cm/s LV PW:         1.30 cm   LV E/e' medial:  17.8 LV IVS:        1.40 cm   LV e' lateral:   6.96 cm/s LVOT diam:     2.00 cm   LV E/e' lateral: 14.5 LV SV:         100 LV SV Index:   58 LVOT Area:     3.14 cm  RIGHT VENTRICLE RV Basal diam:  2.80 cm RV Mid diam:    1.70 cm LEFT ATRIUM             Index        RIGHT ATRIUM           Index LA diam:        3.20 cm 1.87 cm/m   RA Area:     14.10 cm LA Vol (A2C):   39.2 ml 22.91 ml/m  RA Volume:   33.20 ml  19.40 ml/m LA Vol (A4C):   43.9 ml 25.66 ml/m LA Biplane Vol: 45.1 ml 26.36 ml/m  AORTIC VALVE AV Area (Vmax):    2.34 cm AV Area (Vmean):   2.50 cm AV Area (VTI):     3.18 cm AV  Vmax:           154.50 cm/s AV Vmean:          103.000 cm/s AV VTI:            0.314 m AV Peak Grad:      9.5 mmHg AV Mean Grad:      5.0 mmHg LVOT Vmax:  115.00 cm/s LVOT Vmean:        82.000 cm/s LVOT VTI:          0.318 m LVOT/AV VTI ratio: 1.01  AORTA Ao Root diam: 2.10 cm MITRAL VALVE                TRICUSPID VALVE MV Area (PHT): 2.80 cm     TR Peak grad:   37.9 mmHg MV Area VTI:   2.67 cm     TR Vmax:        308.00 cm/s MV Peak grad:  5.5 mmHg MV Mean grad:  2.0 mmHg     SHUNTS MV Vmax:       1.17 m/s     Systemic VTI:  0.32 m MV Vmean:      69.9 cm/s    Systemic Diam: 2.00 cm MV Decel Time: 271 msec MV E velocity: 101.00 cm/s MV A velocity: 119.00 cm/s MV E/A ratio:  0.85 Julien Nordmann MD Electronically signed by Julien Nordmann MD Signature Date/Time: 05/13/2023/12:24:44 PM    Final    MR ANGIO HEAD WO CONTRAST  Result Date: 05/12/2023 CLINICAL DATA:  Worsening headache and left-sided tingling EXAM: MRA NECK WITHOUT AND WITH CONTRAST MRA HEAD WITHOUT CONTRAST TECHNIQUE: Multiplanar and multiecho pulse sequences of the neck were obtained without and with intravenous contrast. Angiographic images of the neck were obtained using MRA technique without and with intravenous contrast; Angiographic images of the Circle of Willis were obtained using MRA technique without intravenous contrast. CONTRAST:  6mL GADAVIST GADOBUTROL 1 MMOL/ML IV SOLN COMPARISON:  No prior MRA, correlation is made with 05/12/2023 CTA head and neck and 05/12/2023 MRI head FINDINGS: MRA NECK FINDINGS Aortic arch: Standard aortic branching. No evidence of aneurysm or dissection in the imaged portion. No significant stenosis of the major arch vessel origins. Right carotid system: 50% stenosis in the proximal right ICA. No evidence of dissection. Left carotid system: 70% stenosis in the proximal left ICA. No evidence of dissection. Vertebral arteries: Left dominant system, with diminutive right vertebral artery. No evidence of  dissection, occlusion, or hemodynamically significant stenosis (greater than 50%). MRA HEAD FINDINGS Anterior circulation: Both internal carotid arteries are patent to the termini, without significant stenosis. A1 segments patent, with moderate stenosis in the distal right A1. Normal anterior communicating artery. Anterior cerebral arteries are patent to their distal aspects without significant stenosis. No M1 stenosis or occlusion. Distal MCA branches perfused to their distal aspects without significant stenosis. Posterior circulation: Vertebral arteries patent to the vertebrobasilar junction without significant stenosis, although there is poor signal in the diminutive right vertebral artery, likely due to the size of the vessel. Posterior inferior cerebral arteries patent bilaterally, better visualized on the MRA neck. Basilar patent to its distal aspect. Superior cerebellar arteries patent proximally. Patent, diminutive P1 segments. Near fetal origin the bilateral PCAs from the posterior communicating arteries. PCAs perfused to their distal aspects without significant stenosis. Anatomic variants: Near fetal origin of the bilateral PCAs. IMPRESSION: 1. 70% stenosis in the proximal left ICA. 2. 50% stenosis in the proximal right ICA. 3. No intracranial large vessel occlusion or significant stenosis. Electronically Signed   By: Wiliam Ke M.D.   On: 05/12/2023 19:38   MR ANGIO NECK W WO CONTRAST  Result Date: 05/12/2023 CLINICAL DATA:  Worsening headache and left-sided tingling EXAM: MRA NECK WITHOUT AND WITH CONTRAST MRA HEAD WITHOUT CONTRAST TECHNIQUE: Multiplanar and multiecho pulse sequences of the neck were obtained without and with intravenous contrast.  Angiographic images of the neck were obtained using MRA technique without and with intravenous contrast; Angiographic images of the Circle of Willis were obtained using MRA technique without intravenous contrast. CONTRAST:  6mL GADAVIST GADOBUTROL 1  MMOL/ML IV SOLN COMPARISON:  No prior MRA, correlation is made with 05/12/2023 CTA head and neck and 05/12/2023 MRI head FINDINGS: MRA NECK FINDINGS Aortic arch: Standard aortic branching. No evidence of aneurysm or dissection in the imaged portion. No significant stenosis of the major arch vessel origins. Right carotid system: 50% stenosis in the proximal right ICA. No evidence of dissection. Left carotid system: 70% stenosis in the proximal left ICA. No evidence of dissection. Vertebral arteries: Left dominant system, with diminutive right vertebral artery. No evidence of dissection, occlusion, or hemodynamically significant stenosis (greater than 50%). MRA HEAD FINDINGS Anterior circulation: Both internal carotid arteries are patent to the termini, without significant stenosis. A1 segments patent, with moderate stenosis in the distal right A1. Normal anterior communicating artery. Anterior cerebral arteries are patent to their distal aspects without significant stenosis. No M1 stenosis or occlusion. Distal MCA branches perfused to their distal aspects without significant stenosis. Posterior circulation: Vertebral arteries patent to the vertebrobasilar junction without significant stenosis, although there is poor signal in the diminutive right vertebral artery, likely due to the size of the vessel. Posterior inferior cerebral arteries patent bilaterally, better visualized on the MRA neck. Basilar patent to its distal aspect. Superior cerebellar arteries patent proximally. Patent, diminutive P1 segments. Near fetal origin the bilateral PCAs from the posterior communicating arteries. PCAs perfused to their distal aspects without significant stenosis. Anatomic variants: Near fetal origin of the bilateral PCAs. IMPRESSION: 1. 70% stenosis in the proximal left ICA. 2. 50% stenosis in the proximal right ICA. 3. No intracranial large vessel occlusion or significant stenosis. Electronically Signed   By: Wiliam Ke M.D.    On: 05/12/2023 19:38   MR BRAIN WO CONTRAST  Result Date: 05/12/2023 CLINICAL DATA:  Neuro deficit, acute, stroke suspected. Left-sided arm and leg numbness and tingling. EXAM: MRI HEAD WITHOUT CONTRAST TECHNIQUE: Multiplanar, multiecho pulse sequences of the brain and surrounding structures were obtained without intravenous contrast. COMPARISON:  CT studies same day.  MRI 12/23/2003. FINDINGS: Brain: Diffusion imaging does not show any acute or subacute infarction or other cause of restricted diffusion. No focal abnormality affects the brainstem or cerebellum. Cerebral hemispheres show minimal small vessel change of the deep white matter, less than often seen at this age. 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 This is not significant. No evidence of obstructive hydrocephalus. No mass, hemorrhage or extra-axial collection. Vascular: Major vessels at the base of the brain show flow. Skull and upper cervical spine: Negative Sinuses/Orbits: Clear/normal Other: None IMPRESSION: No acute or reversible finding. Minimal small vessel change of the deep white matter, less than often seen at this age. Electronically Signed   By: Paulina Fusi M.D.   On: 05/12/2023 17:05   CT ANGIO HEAD NECK W WO CM  Result Date: 05/12/2023 CLINICAL DATA:  Left-sided arm and leg numbness and tingling; headache EXAM: CT ANGIOGRAPHY HEAD AND NECK WITH AND WITHOUT CONTRAST TECHNIQUE: Multidetector CT imaging of the head and neck was performed using the standard protocol during bolus administration of intravenous contrast. Multiplanar CT image reconstructions and MIPs were obtained to evaluate the vascular anatomy. Carotid stenosis measurements (when applicable) are obtained utilizing NASCET criteria, using the distal internal carotid diameter as the denominator. RADIATION DOSE REDUCTION: This  exam was performed according to the departmental dose-optimization program which includes automated exposure  control, adjustment of the mA and/or kV according to patient size and/or use of iterative reconstruction technique. CONTRAST:  75mL OMNIPAQUE IOHEXOL 350 MG/ML SOLN COMPARISON:  No prior CTA available, correlation is made with CT head 10/31/2021 FINDINGS: CT HEAD FINDINGS Brain: No evidence of acute infarct, hemorrhage, mass, mass effect, or midline shift. No hydrocephalus or extra-axial fluid collection. Cavum septum pellucidum et vergae. Periventricular white matter changes, likely the sequela of chronic small vessel ischemic disease. Normal cerebral volume for age. Vascular: No hyperdense vessel. Skull: Negative for fracture or focal lesion. Sinuses/Orbits: No acute finding. Other: The mastoid air cells are well aerated. CTA NECK FINDINGS Aortic arch: Standard branching. Imaged portion shows no evidence of aneurysm or dissection. No significant stenosis of the major arch vessel origins. Aortic atherosclerosis Right carotid system: 50% stenosis in the proximal right ICA (series 10, image 208), primarily secondary to calcified plaque. No evidence of dissection. Left carotid system: Greater than 70% stenosis in the proximal left ICA (series 10, image 205), primarily secondary to calcified plaque. No evidence of dissection. Vertebral arteries: No evidence of dissection, occlusion, or hemodynamically significant stenosis (greater than 50%). The right vertebral artery is diminutive throughout its course Skeleton: No acute osseous abnormality. Degenerative changes in the cervical spine. Status post median sternotomy. Other neck: 1.3 cm hypoenhancing nodule in the left thyroid lobe, for which no follow-up is currently indicated. (Reference: J Am Coll Radiol. 2015 Feb;12(2): 143-50) with calcifications but Upper chest: No focal pulmonary opacity or pleural effusion. Review of the MIP images confirms the above findings CTA HEAD FINDINGS Anterior circulation: Both internal carotid arteries are patent to the termini, without  significant stenosis. A1 segments patent, with severe stenosis distal right A1 (series 10, image 110). Normal anterior communicating artery. Anterior cerebral arteries are patent to their distal aspects without significant stenosis. No M1 stenosis or occlusion. MCA branches perfused to their distal aspects without significant stenosis. Posterior circulation: The right vertebral artery is diminutive and is not well seen at the vertebrobasilar junction. The left vertebral artery patent to the vertebrobasilar junction without significant stenosis. The left PICA is patent. The right PICA is not definitively seen. Basilar patent to its distal aspect without significant stenosis. Superior cerebellar arteries patent proximally. Patent, diminutive P1 segments. Near fetal origin of the bilateral PCAs from the posterior communicating arteries. PCAs perfused to their distal aspects without significant stenosis. Venous sinuses: As permitted by contrast timing, patent. Anatomic variants: Near fetal origin of the bilateral PCAs. No evidence of aneurysm or vascular malformation. Review of the MIP images confirms the above findings IMPRESSION: 1. No acute intracranial process. 2. Greater than 70% stenosis in the proximal left ICA and 50% stenosis in the proximal right ICA. 3. Severe stenosis in the distal right A1. 4. Aortic atherosclerosis. Aortic Atherosclerosis (ICD10-I70.0). Electronically Signed   By: Wiliam Ke M.D.   On: 05/12/2023 12:00               LOS: 2 days   Dorette Hartel  [facility]   Pager on [email or web address]. If 7PM-7AM, please contact night-coverage at [email or web address]     05/14/2023, 8:43 AM

## 2023-05-15 DIAGNOSIS — I6523 Occlusion and stenosis of bilateral carotid arteries: Secondary | ICD-10-CM | POA: Diagnosis not present

## 2023-05-15 LAB — BASIC METABOLIC PANEL
Anion gap: 5 (ref 5–15)
BUN: 13 mg/dL (ref 8–23)
CO2: 25 mmol/L (ref 22–32)
Calcium: 8.4 mg/dL — ABNORMAL LOW (ref 8.9–10.3)
Chloride: 107 mmol/L (ref 98–111)
Creatinine, Ser: 0.82 mg/dL (ref 0.44–1.00)
GFR, Estimated: >60 mL/min (ref >60)
Glucose, Bld: 121 mg/dL — ABNORMAL HIGH (ref 70–99)
Potassium: 4 mmol/L (ref 3.5–5.1)
Sodium: 137 mmol/L (ref 135–145)

## 2023-05-15 LAB — CBC
HCT: 27.4 % — ABNORMAL LOW (ref 36.0–46.0)
Hemoglobin: 9.1 g/dL — ABNORMAL LOW (ref 12.0–15.0)
MCH: 35.8 pg — ABNORMAL HIGH (ref 26.0–34.0)
MCHC: 33.2 g/dL (ref 30.0–36.0)
MCV: 107.9 fL — ABNORMAL HIGH (ref 80.0–100.0)
Platelets: 141 10*3/uL — ABNORMAL LOW (ref 150–400)
RBC: 2.54 MIL/uL — ABNORMAL LOW (ref 3.87–5.11)
RDW: 14.5 % (ref 11.5–15.5)
WBC: 6.9 10*3/uL (ref 4.0–10.5)
nRBC: 0 % (ref 0.0–0.2)

## 2023-05-15 LAB — GLUCOSE, CAPILLARY: Glucose-Capillary: 101 mg/dL — ABNORMAL HIGH (ref 70–99)

## 2023-05-15 MED ORDER — PANTOPRAZOLE SODIUM 40 MG PO TBEC: 40.0000 mg | DELAYED_RELEASE_TABLET | Freq: Every day | ORAL | Status: DC

## 2023-05-15 MED ORDER — CLOPIDOGREL BISULFATE 75 MG PO TABS: 75.0000 mg | ORAL_TABLET | Freq: Every day | ORAL | 0 refills | Status: AC

## 2023-05-15 NOTE — Progress Notes (Signed)
Patient discharged home at this time. Sister shirley to transport. All personal belongings returned to patient at this time. Discharge instructions and follow up appointments reviewed. Patient voiced understanding. Assisted to car by staff. No distress at time of discharge

## 2023-05-15 NOTE — Progress Notes (Signed)
Postop day 1, right carotid endarterectomy:  Hemodynamics have remained stable overnight.  Arterial line was removed accordingly this morning.  Is alert, conversant, oriented.  No evidence of cranial nerve injury.  The incision does have some bruising, but not outside the bounds of expected.  Advance diet, ambulate, oral pain medicine only.  If can do the activities of daily living, can go home from a surgical standpoint today.  All questions answered.

## 2023-05-15 NOTE — TOC Initial Note (Signed)
Transition of Care [facility]) - Initial/Assessment Note    Patient Details  Name: Renee [name] MRN: [id number] Date of Birth: 1951/06/07  Transition of Care [facility]) CM/SW Contact:    Colette Ribas, LCSWA Phone Number: 05/15/2023, 10:11 AM  Clinical Narrative:                  CSW received call from unit regarding DC, CSW met with patient bedside explained role and reason for visit regarding PT reccs, she advised she was not agreeable and will use her gym for outpatient therapy. CSW advised If she needs anything else pls let the nurse know.       Patient Goals and CMS Choice            Expected Discharge Plan and Services         Expected Discharge Date: 05/15/23                                    Prior Living Arrangements/Services                       Activities of Daily Living      Permission Sought/Granted                  Emotional Assessment              Admission diagnosis:  TIA (transient ischemic attack) [G45.9] CVA (cerebral vascular accident) [facility]) [I63.9] Internal carotid artery stenosis, bilateral [I65.23] Patient Active Problem List   Diagnosis Date Noted   TIA (transient ischemic attack) 05/12/2023   Tobacco abuse 05/12/2023   Internal carotid artery stenosis, bilateral 05/12/2023   Hx of CABG 10/13/2022   S/P TAVR (transcatheter aortic valve replacement) 10/13/2022   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) 07/29/2022   Overweight (BMI 25.0-29.9) 07/27/2022   Melena 07/26/2022   Stage 3a chronic kidney disease (CKD) (HCC) 07/26/2022   Aortic stenosis 05/04/2022   Spinal stenosis, lumbar region, with neurogenic claudication 05/08/2021   Lumbar spondylosis 05/08/2021   Lumbar degenerative disc disease 05/08/2021   Onychomycosis of multiple toenails with type 2 diabetes mellitus (HCC) 04/20/2019   Status post total knee replacement using cement, left 10/25/2018   Chronic midline low back  pain without sciatica 02/28/2018   Essential hypertension 02/02/2017   Primary osteoarthritis of left knee 10/28/2016   CAD in native artery 07/28/2016   Pure hypercholesterolemia 07/28/2016   Type 2 diabetes mellitus with diabetic polyneuropathy, without long-term current use of insulin (HCC) 07/28/2016   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 (HCC) 07/27/2016   PCP:  Lynnea Ferrier, MD Pharmacy:   [facility] [phone] - [place], [place] - [address] AT [facility] [address] [place] [address] Phone: [phone] Fax: [phone]     Social Determinants of Health (SDOH) Social History: SDOH Screenings   Food Insecurity: No Food Insecurity (05/13/2023)  Housing: High Risk (05/13/2023)  Transportation Needs: No Transportation Needs (05/13/2023)  Utilities: Not At Risk (05/13/2023)  Financial Resource Strain: Low Risk  (11/05/2022)   Received from [facility]  Tobacco Use: High Risk (05/14/2023)   SDOH Interventions:     Readmission Risk Interventions     No data to display

## 2023-05-15 NOTE — Anesthesia Postprocedure Evaluation (Signed)
Anesthesia Post Note  Patient: Renee [name]  Procedure(s) Performed: ENDARTERECTOMY CAROTID (Right)  Patient location during evaluation: ICU Anesthesia Type: General Level of consciousness: awake and alert Pain management: pain level controlled Vital Signs Assessment: post-procedure vital signs reviewed and stable Respiratory status: spontaneous breathing, nonlabored ventilation, respiratory function stable and patient connected to nasal cannula oxygen Cardiovascular status: blood pressure returned to baseline and stable Postop Assessment: no apparent nausea or vomiting Anesthetic complications: no   No notable events documented.   Last Vitals:  Vitals:   05/15/23 0830 05/15/23 0900  BP:  (!) 117/43  Pulse: 71 76  Resp: 12 16  Temp:    SpO2: 100% 100%    Last Pain:  Vitals:   05/15/23 0400  TempSrc: Axillary  PainSc: 0-No pain                 Cleda Mccreedy Evian Derringer

## 2023-05-15 NOTE — Discharge Summary (Signed)
Physician Discharge Summary   Patient: Renee [name] MRN: [id number] DOB: Oct 19, 1950  Admit date:     05/12/2023  Discharge date: 05/15/23  Discharge Physician: Lurene Shadow   PCP: Lynnea Ferrier, MD   Recommendations at discharge:   Follow-up with PCP in 1 week Follow-up with Dr. Marcha Dutton, vascular surgeon, in 1 month  Discharge Diagnoses: Principal Problem:   Internal carotid artery stenosis, bilateral Active Problems:   TIA (transient ischemic attack)   Stage 3a chronic kidney disease (CKD) (HCC)   Type 2 diabetes mellitus with diabetic polyneuropathy, without long-term current use of insulin (HCC)   Essential hypertension   Hx of CABG   S/P TAVR (transcatheter aortic valve replacement)   Tobacco abuse  Resolved Problems:   * No resolved hospital problems. *  Hospital Course:  Renee [name] is a 72 y.o. female with medical history significant of stage III CKD, CAD status post stenting, aortic stenosis status post TAVR, hypertension, type 2 diabetes, who presented to the hospital with headache, numbness in the left arm and left leg.  Last known normal was around 11 PM on 05/12/2023.  She woke up around 1:30 AM and 05/13/2023 with her symptoms.  She said she had left knee replacement some years ago and since then she has been having some numbness extending from the left knee to the left foot.  She also has carpal tunnel syndrome which causes intermittent numbness in her hands.  Her main concern was the severe headache that she had at home.  However, by the time she got to the ED, headache had improved.     Workup was negative for acute stroke.  However, she was found to have bilateral internal carotid artery stenosis that is more severe on the left.      Assessment and Plan:  Proximal left ICA 70% stenosis, proximal right ICA 50% stenosis: S/p right carotid endarterectomy with bovine pericardial arterial patch reconstruction on 05/14/2023. She will be discharged on Plavix.   Continue low-dose aspirin.  She understands that there is increased risk of bleeding with dual antiplatelet therapy with aspirin and Plavix.  She agrees to proceed with treatment. 2D echo showed EF estimate 30 to 65%, grade 1 diastolic dysfunction, moderate TR.     Left-sided numbness and headache: Improved.  It is not clear whether patient had a TIA.  Of note, patient also has moderate cervical spondylosis, carpal tunnel syndrome and history of left knee replacement that causes intermittent numbness in her upper and lower extremities     Hypertension: Continue amlodipine and carvedilol     Type II DM with polyneuropathy: NovoLog as needed for hyperglycemia.  Continue gabapentin.  Hemoglobin A1c was 5.5. Lipid panel showed LDL 65, HDL 57, total cholesterol 440, triglycerides 76.     S/p TAVR for aortic stenosis in May 2024.     CAD s/p CABG: Cardiac catheterization in April 2024 showed widely patent coronary arteries and patent LIMA to LAD graft.     Tobacco use disorder: Counseled to quit smoking cigarettes.     Probable CKD stage IIIa.  Of note, patient has fluctuating GFR.    Her condition has improved and she is deemed stable for discharge to home today.  Discharge plan was discussed with patient and her sister, Talbert Forest, at the bedside.  She said she no longer takes metformin, iron and HCTZ. She declined outpatient OT.      Consultants: Vascular surgeon Procedures performed: S/p right carotid endarterectomy with bovine  pericardial arterial patch reconstruction on 05/14/2023.  Disposition: Home Diet recommendation:  Discharge Diet Orders (From admission, onward)     Start     Ordered   05/15/23 0000  Diet - low sodium heart healthy        05/15/23 0918           Cardiac diet DISCHARGE MEDICATION: Allergies as of 05/15/2023       Reactions   Tizanidine    headaches   Venlafaxine Other (See Comments)   intolerant   Codeine Nausea And Vomiting   Tramadol Nausea  And Vomiting        Medication List     STOP taking these medications    metFORMIN 500 MG 24 hr tablet Commonly known as: GLUCOPHAGE-XR   Slow Fe 142 (45 Fe) MG Tbcr tablet Generic drug: ferrous sulfate ER       TAKE these medications    Accu-Chek Guide Me w/Device Kit USE AS DIRECTED TO TEST SUGAR DAILY   Accu-Chek Guide test strip Generic drug: glucose blood USE TO TEST SUGAR DAILY   Accu-Chek Softclix Lancets lancets daily.   acetaminophen 500 MG tablet Commonly known as: TYLENOL Take 500 mg by mouth every 6 (six) hours as needed for headache or fever.   Alpha-Lipoic Acid 600 MG Caps Take 600 mg by mouth daily.   amLODipine 5 MG tablet Commonly known as: NORVASC Take 5 mg by mouth daily.   amoxicillin 500 MG tablet Commonly known as: AMOXIL Take 4 tablets (2,000 mg total) by mouth as directed. 1 hour prior to dental work including cleanings   aspirin EC 81 MG tablet Take 81 mg by mouth daily. Swallow whole.   Azelastine HCl 137 MCG/SPRAY Soln Place 1 spray into the nose 2 (two) times daily as needed (allergies).   carvedilol 6.25 MG tablet Commonly known as: COREG Take 6.25 mg by mouth 2 (two) times a day.   CINNAMON PO Take 1,000 mg by mouth daily.   clopidogrel 75 MG tablet Commonly known as: PLAVIX Take 1 tablet (75 mg total) by mouth daily at 6 (six) AM. Start taking on: May 16, 2023   [employer] Senna 8.6 MG tablet Generic drug: senna TAKE 1 TABLET (8.6 MG TOTAL) BY MOUTH DAILY AS NEEDED FOR MILD CONSTIPATION.   EPINEPHrine 0.3 mg/0.3 mL Soaj injection Commonly known as: EPI-PEN Inject 0.3 mg into the muscle as needed for anaphylaxis.   Fish Oil 1000 MG Caps Take 1,000 mg by mouth 3 (three) times daily.   furosemide 20 MG tablet Commonly known as: LASIX Take 20 mg by mouth once a week. In the morning   gabapentin 800 MG tablet Commonly known as: NEURONTIN Take 800-1,600 mg by mouth See admin instructions. Take 1 tablet (800 mg) by  mouth in the morning, 1 tablet (800 mg) by mouth at noon, & take 2 tablets (1600 mg) by mouth at bedtime.   loratadine 10 MG tablet Commonly known as: CLARITIN Take 10 mg by mouth daily as needed for allergies.   nitroGLYCERIN 0.4 MG SL tablet Commonly known as: NITROSTAT Place 0.4 mg under the tongue every 5 (five) minutes as needed for chest pain.   potassium chloride SA 20 MEQ tablet Commonly known as: KLOR-CON M Take 20 mEq by mouth daily.   rosuvastatin 20 MG tablet Commonly known as: CRESTOR Take 20 mg by mouth at bedtime.   Systane Balance 0.6 % Soln Generic drug: Propylene Glycol Place 1 drop into both eyes daily.  triamcinolone cream 0.5 % Commonly known as: KENALOG Apply 1 Application topically daily as needed (irritation).   Vitamin D3 50 MCG (2000 UT) Tabs Take 2,000 Units by mouth daily.        Discharge Exam: Filed Weights   05/12/23 0942 05/14/23 1800  Weight: 68 kg 72.6 kg   GEN: NAD SKIN: No rash.  Warm and dry.  Incisional wound on right carotid area is clean, dry and intact with some slight bruising. EYES: No pallor or icterus ENT: MMM CV: RRR PULM: CTA B ABD: soft, obese, NT, +BS CNS: AAO x 3, non focal EXT: No edema or tenderness   Condition at discharge: good  The results of significant diagnostics from this hospitalization (including imaging, microbiology, ancillary and laboratory) are listed below for reference.   Imaging Studies: ECHOCARDIOGRAM COMPLETE  Result Date: 05/13/2023    ECHOCARDIOGRAM REPORT   Patient Name:   KRYSTOL MONJARAS Trimarco Date of Exam: 05/13/2023 Medical Rec #:  [id number]     Height:       63.0 in Accession #:    [id number]    Weight:       150.0 lb Date of Birth:  09/15/1950     BSA:          1.711 m Patient Age:    72 years      BP:           151/52 mmHg Patient Gender: F             HR:           65 bpm. Exam Location:  [facility] Procedure: 2D Echo, Color Doppler, Cardiac Doppler and Saline Contrast Bubble            Study  Indications:     TIA G45.9  History:         Patient has prior history of Echocardiogram examinations, most                  recent 11/18/2022. Risk Factors:Hypertension and Diabetes. S/P                  TAVR.  Sonographer:     Cristela Blue Referring Phys:  [id number] Francoise Schaumann [place] Diagnosing Phys: Julien Nordmann MD IMPRESSIONS  1. Left ventricular ejection fraction, by estimation, is 60 to 65%. The left ventricle has normal function. The left ventricle has no regional wall motion abnormalities. There is mild left ventricular hypertrophy. Left ventricular diastolic parameters are consistent with Grade I diastolic dysfunction (impaired relaxation).  2. Right ventricular systolic function is normal. The right ventricular size is normal. There is mildly elevated pulmonary artery systolic pressure. The estimated right ventricular systolic pressure is 42.9 mmHg.  3. The mitral valve is normal in structure. Mild mitral valve regurgitation. No evidence of mitral stenosis.  4. Tricuspid valve regurgitation is moderate.  5. The aortic valve is normal in structure. Aortic valve regurgitation is not visualized. No aortic stenosis is present.  6. The inferior vena cava is normal in size with greater than 50% respiratory variability, suggesting right atrial pressure of 3 mmHg.  7. Agitated saline contrast bubble study was negative, with no evidence of any interatrial shunt. FINDINGS  Left Ventricle: Left ventricular ejection fraction, by estimation, is 60 to 65%. The left ventricle has normal function. The left ventricle has no regional wall motion abnormalities. The left ventricular internal cavity size was normal in size. There is  mild left ventricular hypertrophy. Left ventricular diastolic parameters  are consistent with Grade I diastolic dysfunction (impaired relaxation). Right Ventricle: The right ventricular size is normal. No increase in right ventricular wall thickness. Right ventricular systolic function is normal. There is  mildly elevated pulmonary artery systolic pressure. The tricuspid regurgitant velocity is 3.08  m/s, and with an assumed right atrial pressure of 5 mmHg, the estimated right ventricular systolic pressure is 42.9 mmHg. Left Atrium: Left atrial size was normal in size. Right Atrium: Right atrial size was normal in size. Pericardium: There is no evidence of pericardial effusion. Mitral Valve: The mitral valve is normal in structure. Mild mitral valve regurgitation. No evidence of mitral valve stenosis. MV peak gradient, 5.5 mmHg. The mean mitral valve gradient is 2.0 mmHg. Tricuspid Valve: The tricuspid valve is normal in structure. Tricuspid valve regurgitation is moderate . No evidence of tricuspid stenosis. Aortic Valve: The aortic valve is normal in structure. Aortic valve regurgitation is not visualized. No aortic stenosis is present. Aortic valve mean gradient measures 5.0 mmHg. Aortic valve peak gradient measures 9.5 mmHg. Aortic valve area, by VTI measures 3.18 cm. Pulmonic Valve: The pulmonic valve was normal in structure. Pulmonic valve regurgitation is not visualized. No evidence of pulmonic stenosis. Aorta: The aortic root is normal in size and structure. Venous: The inferior vena cava is normal in size with greater than 50% respiratory variability, suggesting right atrial pressure of 3 mmHg. IAS/Shunts: No atrial level shunt detected by color flow Doppler. Agitated saline contrast was given intravenously to evaluate for intracardiac shunting. Agitated saline contrast bubble study was negative, with no evidence of any interatrial shunt. There  is no evidence of a patent foramen ovale. There is no evidence of an atrial septal defect.  LEFT VENTRICLE PLAX 2D LVIDd:         4.20 cm   Diastology LVIDs:         2.60 cm   LV e' medial:    5.66 cm/s LV PW:         1.30 cm   LV E/e' medial:  17.8 LV IVS:        1.40 cm   LV e' lateral:   6.96 cm/s LVOT diam:     2.00 cm   LV E/e' lateral: 14.5 LV SV:         100  LV SV Index:   58 LVOT Area:     3.14 cm  RIGHT VENTRICLE RV Basal diam:  2.80 cm RV Mid diam:    1.70 cm LEFT ATRIUM             Index        RIGHT ATRIUM           Index LA diam:        3.20 cm 1.87 cm/m   RA Area:     14.10 cm LA Vol (A2C):   39.2 ml 22.91 ml/m  RA Volume:   33.20 ml  19.40 ml/m LA Vol (A4C):   43.9 ml 25.66 ml/m LA Biplane Vol: 45.1 ml 26.36 ml/m  AORTIC VALVE AV Area (Vmax):    2.34 cm AV Area (Vmean):   2.50 cm AV Area (VTI):     3.18 cm AV Vmax:           154.50 cm/s AV Vmean:          103.000 cm/s AV VTI:            0.314 m AV Peak Grad:      9.5 mmHg  AV Mean Grad:      5.0 mmHg LVOT Vmax:         115.00 cm/s LVOT Vmean:        82.000 cm/s LVOT VTI:          0.318 m LVOT/AV VTI ratio: 1.01  AORTA Ao Root diam: 2.10 cm MITRAL VALVE                TRICUSPID VALVE MV Area (PHT): 2.80 cm     TR Peak grad:   37.9 mmHg MV Area VTI:   2.67 cm     TR Vmax:        308.00 cm/s MV Peak grad:  5.5 mmHg MV Mean grad:  2.0 mmHg     SHUNTS MV Vmax:       1.17 m/s     Systemic VTI:  0.32 m MV Vmean:      69.9 cm/s    Systemic Diam: 2.00 cm MV Decel Time: 271 msec MV E velocity: 101.00 cm/s MV A velocity: 119.00 cm/s MV E/A ratio:  0.85 Julien Nordmann MD Electronically signed by Julien Nordmann MD Signature Date/Time: 05/13/2023/12:24:44 PM    Final    MR ANGIO HEAD WO CONTRAST  Result Date: 05/12/2023 CLINICAL DATA:  Worsening headache and left-sided tingling EXAM: MRA NECK WITHOUT AND WITH CONTRAST MRA HEAD WITHOUT CONTRAST TECHNIQUE: Multiplanar and multiecho pulse sequences of the neck were obtained without and with intravenous contrast. Angiographic images of the neck were obtained using MRA technique without and with intravenous contrast; Angiographic images of the Circle of Willis were obtained using MRA technique without intravenous contrast. CONTRAST:  6mL GADAVIST GADOBUTROL 1 MMOL/ML IV SOLN COMPARISON:  No prior MRA, correlation is made with 05/12/2023 CTA head and neck and  05/12/2023 MRI head FINDINGS: MRA NECK FINDINGS Aortic arch: Standard aortic branching. No evidence of aneurysm or dissection in the imaged portion. No significant stenosis of the major arch vessel origins. Right carotid system: 50% stenosis in the proximal right ICA. No evidence of dissection. Left carotid system: 70% stenosis in the proximal left ICA. No evidence of dissection. Vertebral arteries: Left dominant system, with diminutive right vertebral artery. No evidence of dissection, occlusion, or hemodynamically significant stenosis (greater than 50%). MRA HEAD FINDINGS Anterior circulation: Both internal carotid arteries are patent to the termini, without significant stenosis. A1 segments patent, with moderate stenosis in the distal right A1. Normal anterior communicating artery. Anterior cerebral arteries are patent to their distal aspects without significant stenosis. No M1 stenosis or occlusion. Distal MCA branches perfused to their distal aspects without significant stenosis. Posterior circulation: Vertebral arteries patent to the vertebrobasilar junction without significant stenosis, although there is poor signal in the diminutive right vertebral artery, likely due to the size of the vessel. Posterior inferior cerebral arteries patent bilaterally, better visualized on the MRA neck. Basilar patent to its distal aspect. Superior cerebellar arteries patent proximally. Patent, diminutive P1 segments. Near fetal origin the bilateral PCAs from the posterior communicating arteries. PCAs perfused to their distal aspects without significant stenosis. Anatomic variants: Near fetal origin of the bilateral PCAs. IMPRESSION: 1. 70% stenosis in the proximal left ICA. 2. 50% stenosis in the proximal right ICA. 3. No intracranial large vessel occlusion or significant stenosis. Electronically Signed   By: Wiliam Ke M.D.   On: 05/12/2023 19:38   MR ANGIO NECK W WO CONTRAST  Result Date: 05/12/2023 CLINICAL DATA:   Worsening headache and left-sided tingling EXAM: MRA NECK WITHOUT AND WITH  CONTRAST MRA HEAD WITHOUT CONTRAST TECHNIQUE: Multiplanar and multiecho pulse sequences of the neck were obtained without and with intravenous contrast. Angiographic images of the neck were obtained using MRA technique without and with intravenous contrast; Angiographic images of the Circle of Willis were obtained using MRA technique without intravenous contrast. CONTRAST:  6mL GADAVIST GADOBUTROL 1 MMOL/ML IV SOLN COMPARISON:  No prior MRA, correlation is made with 05/12/2023 CTA head and neck and 05/12/2023 MRI head FINDINGS: MRA NECK FINDINGS Aortic arch: Standard aortic branching. No evidence of aneurysm or dissection in the imaged portion. No significant stenosis of the major arch vessel origins. Right carotid system: 50% stenosis in the proximal right ICA. No evidence of dissection. Left carotid system: 70% stenosis in the proximal left ICA. No evidence of dissection. Vertebral arteries: Left dominant system, with diminutive right vertebral artery. No evidence of dissection, occlusion, or hemodynamically significant stenosis (greater than 50%). MRA HEAD FINDINGS Anterior circulation: Both internal carotid arteries are patent to the termini, without significant stenosis. A1 segments patent, with moderate stenosis in the distal right A1. Normal anterior communicating artery. Anterior cerebral arteries are patent to their distal aspects without significant stenosis. No M1 stenosis or occlusion. Distal MCA branches perfused to their distal aspects without significant stenosis. Posterior circulation: Vertebral arteries patent to the vertebrobasilar junction without significant stenosis, although there is poor signal in the diminutive right vertebral artery, likely due to the size of the vessel. Posterior inferior cerebral arteries patent bilaterally, better visualized on the MRA neck. Basilar patent to its distal aspect. Superior cerebellar  arteries patent proximally. Patent, diminutive P1 segments. Near fetal origin the bilateral PCAs from the posterior communicating arteries. PCAs perfused to their distal aspects without significant stenosis. Anatomic variants: Near fetal origin of the bilateral PCAs. IMPRESSION: 1. 70% stenosis in the proximal left ICA. 2. 50% stenosis in the proximal right ICA. 3. No intracranial large vessel occlusion or significant stenosis. Electronically Signed   By: Wiliam Ke M.D.   On: 05/12/2023 19:38   MR BRAIN WO CONTRAST  Result Date: 05/12/2023 CLINICAL DATA:  Neuro deficit, acute, stroke suspected. Left-sided arm and leg numbness and tingling. EXAM: MRI HEAD WITHOUT CONTRAST TECHNIQUE: Multiplanar, multiecho pulse sequences of the brain and surrounding structures were obtained without intravenous contrast. COMPARISON:  CT studies same day.  MRI 12/23/2003. FINDINGS: Brain: Diffusion imaging does not show any acute or subacute infarction or other cause of restricted diffusion. No focal abnormality affects the brainstem or cerebellum. Cerebral hemispheres show minimal small vessel change of the deep white matter, less than often seen at this age. 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 This is not significant. No evidence of obstructive hydrocephalus. No mass, hemorrhage or extra-axial collection. Vascular: Major vessels at the base of the brain show flow. Skull and upper cervical spine: Negative Sinuses/Orbits: Clear/normal Other: None IMPRESSION: No acute or reversible finding. Minimal small vessel change of the deep white matter, less than often seen at this age. Electronically Signed   By: Paulina Fusi M.D.   On: 05/12/2023 17:05   CT ANGIO HEAD NECK W WO CM  Result Date: 05/12/2023 CLINICAL DATA:  Left-sided arm and leg numbness and tingling; headache EXAM: CT ANGIOGRAPHY HEAD AND NECK WITH AND WITHOUT CONTRAST TECHNIQUE: Multidetector CT imaging of the head and neck  was performed using the standard protocol during bolus administration of intravenous contrast. Multiplanar CT image reconstructions and MIPs were obtained to evaluate the vascular anatomy. Carotid stenosis  measurements (when applicable) are obtained utilizing NASCET criteria, using the distal internal carotid diameter as the denominator. RADIATION DOSE REDUCTION: This exam was performed according to the departmental dose-optimization program which includes automated exposure control, adjustment of the mA and/or kV according to patient size and/or use of iterative reconstruction technique. CONTRAST:  75mL OMNIPAQUE IOHEXOL 350 MG/ML SOLN COMPARISON:  No prior CTA available, correlation is made with CT head 10/31/2021 FINDINGS: CT HEAD FINDINGS Brain: No evidence of acute infarct, hemorrhage, mass, mass effect, or midline shift. No hydrocephalus or extra-axial fluid collection. Cavum septum pellucidum et vergae. Periventricular white matter changes, likely the sequela of chronic small vessel ischemic disease. Normal cerebral volume for age. Vascular: No hyperdense vessel. Skull: Negative for fracture or focal lesion. Sinuses/Orbits: No acute finding. Other: The mastoid air cells are well aerated. CTA NECK FINDINGS Aortic arch: Standard branching. Imaged portion shows no evidence of aneurysm or dissection. No significant stenosis of the major arch vessel origins. Aortic atherosclerosis Right carotid system: 50% stenosis in the proximal right ICA (series 10, image 208), primarily secondary to calcified plaque. No evidence of dissection. Left carotid system: Greater than 70% stenosis in the proximal left ICA (series 10, image 205), primarily secondary to calcified plaque. No evidence of dissection. Vertebral arteries: No evidence of dissection, occlusion, or hemodynamically significant stenosis (greater than 50%). The right vertebral artery is diminutive throughout its course Skeleton: No acute osseous abnormality.  Degenerative changes in the cervical spine. Status post median sternotomy. Other neck: 1.3 cm hypoenhancing nodule in the left thyroid lobe, for which no follow-up is currently indicated. (Reference: J Am Coll Radiol. 2015 Feb;12(2): 143-50) with calcifications but Upper chest: No focal pulmonary opacity or pleural effusion. Review of the MIP images confirms the above findings CTA HEAD FINDINGS Anterior circulation: Both internal carotid arteries are patent to the termini, without significant stenosis. A1 segments patent, with severe stenosis distal right A1 (series 10, image 110). Normal anterior communicating artery. Anterior cerebral arteries are patent to their distal aspects without significant stenosis. No M1 stenosis or occlusion. MCA branches perfused to their distal aspects without significant stenosis. Posterior circulation: The right vertebral artery is diminutive and is not well seen at the vertebrobasilar junction. The left vertebral artery patent to the vertebrobasilar junction without significant stenosis. The left PICA is patent. The right PICA is not definitively seen. Basilar patent to its distal aspect without significant stenosis. Superior cerebellar arteries patent proximally. Patent, diminutive P1 segments. Near fetal origin of the bilateral PCAs from the posterior communicating arteries. PCAs perfused to their distal aspects without significant stenosis. Venous sinuses: As permitted by contrast timing, patent. Anatomic variants: Near fetal origin of the bilateral PCAs. No evidence of aneurysm or vascular malformation. Review of the MIP images confirms the above findings IMPRESSION: 1. No acute intracranial process. 2. Greater than 70% stenosis in the proximal left ICA and 50% stenosis in the proximal right ICA. 3. Severe stenosis in the distal right A1. 4. Aortic atherosclerosis. Aortic Atherosclerosis (ICD10-I70.0). Electronically Signed   By: Wiliam Ke M.D.   On: 05/12/2023 12:00     Microbiology: Results for orders placed or performed during the hospital encounter of 10/09/22  SARS CORONAVIRUS 2 (TAT 6-24 HRS) Anterior Nasal Swab     Status: None   Collection Time: 10/09/22  9:24 AM   Specimen: Anterior Nasal Swab  Result Value Ref Range Status   SARS Coronavirus 2 NEGATIVE NEGATIVE Final    Comment: (NOTE) SARS-CoV-2 target nucleic acids are NOT  DETECTED.  The SARS-CoV-2 RNA is generally detectable in upper and lower respiratory specimens during the acute phase of infection. Negative results do not preclude SARS-CoV-2 infection, do not rule out co-infections with other pathogens, and should not be used as the sole basis for treatment or other patient management decisions. Negative results must be combined with clinical observations, patient history, and epidemiological information. The expected result is Negative.  Fact Sheet for Patients: HairSlick.no  Fact Sheet for Healthcare Providers: quierodirigir.com  This test is not yet approved or cleared by the [place] FDA and  has been authorized for detection and/or diagnosis of SARS-CoV-2 by FDA under an Emergency Use Authorization (EUA). This EUA will remain  in effect (meaning this test can be used) for the duration of the COVID-19 declaration under Se ction 564(b)(1) of the Act, 21 U.S.C. section 360bbb-3(b)(1), unless the authorization is terminated or revoked sooner.  Performed at [facility], [address], [place], [place] [address]   Surgical pcr screen     Status: None   Collection Time: 10/09/22  9:24 AM   Specimen: Nasal Mucosa; Nasal Swab  Result Value Ref Range Status   MRSA, PCR NEGATIVE NEGATIVE Final   Staphylococcus aureus NEGATIVE NEGATIVE Final    Comment: (NOTE) The Xpert SA Assay (FDA approved for NASAL specimens in patients 107 years of age and older), is one component of a comprehensive surveillance program. It  is not intended to diagnose infection nor to guide or monitor treatment. Performed at [facility], [address] [place], [place] [address]     Labs: CBC: Recent Labs  Lab 05/12/23 1007 05/14/23 0458 05/15/23 0435  WBC 5.0 5.0 6.9  NEUTROABS 2.7  --   --   HGB 10.9* 10.0* 9.1*  HCT 32.8* 29.7* 27.4*  MCV 106.5* 105.3* 107.9*  PLT 148* 141* 141*   Basic Metabolic Panel: Recent Labs  Lab 05/12/23 1007 05/15/23 0435  NA 138 137  K 3.8 4.0  CL 108 107  CO2 22 25  GLUCOSE 121* 121*  BUN 22 13  CREATININE 0.94 0.82  CALCIUM 9.3 8.4*  MG 2.3  --    Liver Function Tests: No results for input(s): "AST", "ALT", "ALKPHOS", "BILITOT", "PROT", "ALBUMIN" in the last 168 hours. CBG: Recent Labs  Lab 05/14/23 1020 05/14/23 1450 05/14/23 1731 05/14/23 2148 05/15/23 0743  GLUCAP 104* 114* 122* 160* 101*    Discharge time spent: greater than 30 minutes.  Signed: Lurene Shadow, MD [facility] 05/15/2023

## 2023-05-17 ENCOUNTER — Other Ambulatory Visit: Payer: Self-pay | Admitting: Physician Assistant

## 2023-05-17 DIAGNOSIS — I2510 Atherosclerotic heart disease of native coronary artery without angina pectoris: Secondary | ICD-10-CM | POA: Diagnosis not present

## 2023-05-17 DIAGNOSIS — Z952 Presence of prosthetic heart valve: Secondary | ICD-10-CM

## 2023-05-17 DIAGNOSIS — Z2821 Immunization not carried out because of patient refusal: Secondary | ICD-10-CM | POA: Diagnosis not present

## 2023-05-17 DIAGNOSIS — E7800 Pure hypercholesterolemia, unspecified: Secondary | ICD-10-CM | POA: Diagnosis not present

## 2023-05-17 DIAGNOSIS — G63 Polyneuropathy in diseases classified elsewhere: Secondary | ICD-10-CM | POA: Diagnosis not present

## 2023-05-17 DIAGNOSIS — I10 Essential (primary) hypertension: Secondary | ICD-10-CM | POA: Diagnosis not present

## 2023-05-17 DIAGNOSIS — I6523 Occlusion and stenosis of bilateral carotid arteries: Secondary | ICD-10-CM | POA: Diagnosis not present

## 2023-05-17 DIAGNOSIS — M48062 Spinal stenosis, lumbar region with neurogenic claudication: Secondary | ICD-10-CM | POA: Diagnosis not present

## 2023-05-17 DIAGNOSIS — Z953 Presence of xenogenic heart valve: Secondary | ICD-10-CM | POA: Diagnosis not present

## 2023-05-17 DIAGNOSIS — E1142 Type 2 diabetes mellitus with diabetic polyneuropathy: Secondary | ICD-10-CM | POA: Diagnosis not present

## 2023-05-18 ENCOUNTER — Encounter: Payer: Self-pay | Admitting: Vascular Surgery

## 2023-05-18 LAB — SURGICAL PATHOLOGY

## 2023-05-19 ENCOUNTER — Other Ambulatory Visit: Payer: Self-pay | Admitting: Neurosurgery

## 2023-05-20 ENCOUNTER — Telehealth (INDEPENDENT_AMBULATORY_CARE_PROVIDER_SITE_OTHER): Payer: Self-pay | Admitting: Vascular Surgery

## 2023-05-20 NOTE — Telephone Encounter (Signed)
Patient was notified that post op appointment is schedule for correct time frame and the pulling feeling is normal with the process of healing. Patient verbalized understanding.

## 2023-05-20 NOTE — Telephone Encounter (Signed)
Patient called to schedule her fu with GS. Hospital discharge note said "Follow up with Schnier, Latina Craver, MD (Vascular Surgery) in 1 month (06/14/2023); Ultrasound bilateral carotids. " Scheduled patient for Mon, 06/14/23 for [place] & GS appointment.  Patient is concerned about her neck. States that the lower part of her neck feels like it it being pulled and there is no bandage. Wants to know if this is normal or if she needs to be seen sooner. Please advise

## 2023-05-31 DIAGNOSIS — D649 Anemia, unspecified: Secondary | ICD-10-CM | POA: Diagnosis not present

## 2023-06-10 ENCOUNTER — Other Ambulatory Visit (INDEPENDENT_AMBULATORY_CARE_PROVIDER_SITE_OTHER): Payer: Self-pay | Admitting: Vascular Surgery

## 2023-06-10 DIAGNOSIS — I6523 Occlusion and stenosis of bilateral carotid arteries: Secondary | ICD-10-CM

## 2023-06-13 NOTE — Progress Notes (Signed)
 Patient ID: Renee [name], female   DOB: 1951/05/15, 73 y.o.   MRN: [id number]  No chief complaint on file.   HPI Renee [name] is a 73 y.o. female.    The patient is seen for follow up evaluation of carotid stenosis status post right carotid endarterectomy on 05/14/2023.  There were no post operative problems or complications related to the surgery.  The patient denies neck or incisional pain.  The patient denies interval amaurosis fugax. There is no recent history of TIA symptoms or focal motor deficits. There is no prior documented CVA.  The patient denies headache.  The patient is taking enteric-coated aspirin  81 mg daily.  No recent shortening of the patient's walking distance or new symptoms consistent with claudication.  No history of rest pain symptoms. No new ulcers or wounds of the lower extremities have occurred.  There is no history of DVT, PE or superficial thrombophlebitis. No recent episodes of angina or shortness of breath documented.    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 Wonda & Dr. Lucas   Severe aortic stenosis     Past Surgical History:  Procedure Laterality Date   ABDOMINAL HYSTERECTOMY     BREAST EXCISIONAL BIOPSY Right 20+ yrs ago   benign   CARDIAC SURGERY     cabg   CARPAL TUNNEL RELEASE Bilateral    COLONOSCOPY N/A 02/24/2017   Procedure: COLONOSCOPY;  Surgeon: Toledo, [name], MD;  Location: [facility] END[facility];  Service: Endoscopy;  Laterality: N/A;   COLONOSCOPY N/A 07/28/2022   Procedure: COLONOSCOPY;  Surgeon: Toledo, [name], MD;  Location: [facility];  Service: Gastroenterology;  Laterality: N/A;   CORONARY ARTERY  BYPASS GRAFT     1996   CORONARY/GRAFT ANGIOGRAPHY N/A 09/15/2022   Procedure: CORONARY/GRAFT ANGIOGRAPHY;  Surgeon: [name], MD;  Location: [facility] INVASIVE CV LAB;  Service: Cardiovascular;  Laterality: N/A;   ENDARTERECTOMY Right 05/14/2023   Procedure: ENDARTERECTOMY CAROTID;  Surgeon: [name], MD;  Location: [facility] ORS;  Service: Vascular;  Laterality: Right;   ESOPHAGOGASTRODUODENOSCOPY (EGD) WITH PROPOFOL  N/A 07/27/2022   Procedure: ESOPHAGOGASTRODUODENOSCOPY (EGD) WITH PROPOFOL ;  Surgeon: Toledo, [name], MD;  Location: [facility]OSCOPY;  Service: Gastroenterology;  Laterality: N/A;   HERNIA REPAIR     umbilical   INTRAOPERATIVE TRANSTHORACIC ECHOCARDIOGRAM N/A 10/13/2022   Procedure: INTRAOPERATIVE TRANSTHORACIC ECHOCARDIOGRAM;  Surgeon: [name], MD;  Location: [facility] INVASIVE CV LAB;  Service: Open Heart Surgery;  Laterality: N/A;   LUMBAR LAMINECTOMY/DECOMPRESSION MICRODISCECTOMY N/A 06/25/2021   Procedure: L2-3 DECOMPRESSION;  Surgeon: [name], MD;  Location: [facility] ORS;  Service: Neurosurgery;  Laterality: N/A;   TOTAL KNEE ARTHROPLASTY Left 10/25/2018   Procedure: TOTAL KNEE ARTHROPLASTY - LEFT - DIABETIC;  Surgeon: [name], MD;  Location: [facility] ORS;  Service: Orthopedics;  Laterality: Left;   TRANSCATHETER AORTIC VALVE REPLACEMENT, TRANSFEMORAL Right 10/13/2022   Procedure: Transcatheter Aortic Valve Replacement, Transfemoral;  Surgeon: [name], MD;  Location: [facility] INVASIVE CV LAB;  Service: Open Heart Surgery;  Laterality: Right;      Allergies  Allergen Reactions   Tizanidine   headaches   Venlafaxine Other (See Comments)    intolerant   Codeine Nausea And Vomiting   Tramadol Nausea And Vomiting    Current Outpatient Medications  Medication Sig Dispense Refill   ACCU-CHEK GUIDE test strip USE TO TEST SUGAR DAILY     Accu-Chek Softclix Lancets lancets daily.     acetaminophen  (TYLENOL ) 500 MG tablet Take 500 mg by mouth every 6 (six)  hours as needed for headache or fever.     Alpha-Lipoic Acid 600 MG CAPS Take 600 mg by mouth daily.     amLODipine  (NORVASC ) 5 MG tablet Take 5 mg by mouth daily.     amoxicillin  (AMOXIL ) 500 MG tablet Take 4 tablets (2,000 mg total) by mouth as directed. 1 hour prior to dental work including cleanings 12 tablet 12   aspirin  EC 81 MG tablet Take 81 mg by mouth daily. Swallow whole.     Azelastine HCl 137 MCG/SPRAY SOLN Place 1 spray into the nose 2 (two) times daily as needed (allergies).     Blood Glucose Monitoring Suppl (ACCU-CHEK GUIDE ME) w/Device KIT USE AS DIRECTED TO TEST SUGAR DAILY     carvedilol  (COREG ) 6.25 MG tablet Take 6.25 mg by mouth 2 (two) times a day.      Cholecalciferol  (VITAMIN D3) 50 MCG (2000 UT) TABS Take 2,000 Units by mouth daily.     CINNAMON  PO Take 1,000 mg by mouth daily.     clopidogrel  (PLAVIX ) 75 MG tablet Take 1 tablet (75 mg total) by mouth daily at 6 (six) AM. 30 tablet 0   CVS SENNA 8.6 MG tablet TAKE 1 TABLET (8.6 MG TOTAL) BY MOUTH DAILY AS NEEDED FOR MILD CONSTIPATION. 30 tablet 0   EPINEPHrine  0.3 mg/0.3 mL IJ SOAJ injection Inject 0.3 mg into the muscle as needed for anaphylaxis.     furosemide  (LASIX ) 20 MG tablet Take 20 mg by mouth once a week. In the morning     gabapentin  (NEURONTIN ) 800 MG tablet Take 800-1,600 mg by mouth See admin instructions. Take 1 tablet (800 mg) by mouth in the morning, 1 tablet (800 mg) by mouth at noon, & take 2 tablets (1600 mg) by mouth at bedtime.     loratadine  (CLARITIN ) 10 MG tablet Take 10 mg by mouth daily as needed for allergies.     nitroGLYCERIN  (NITROSTAT ) 0.4 MG SL tablet Place 0.4 mg under the tongue every 5 (five) minutes as needed for chest pain.     Omega-3 Fatty Acids (FISH OIL) 1000 MG CAPS Take 1,000 mg by mouth 3 (three) times daily.     potassium chloride  SA (KLOR-CON  M) 20 MEQ tablet Take 20 mEq by mouth daily.     Propylene Glycol (SYSTANE BALANCE) 0.6 % SOLN Place 1 drop into both eyes daily.      rosuvastatin  (CRESTOR ) 20 MG tablet Take 20 mg by mouth at bedtime.     triamcinolone  cream (KENALOG ) 0.5 % Apply 1 Application topically daily as needed (irritation).     No current facility-administered medications for this visit.        Physical Exam There were no vitals taken for this visit. Gen:  WD/WN, NAD Skin: incision C/D/I Neuro:  intact     Assessment/Plan: 1. Internal carotid artery stenosis, bilateral (Primary) Recommend:  The patient is s/p successful right CEA  Duplex ultrasound  shows 1-39%  stenosis.  Continue antiplatelet therapy as prescribed Continue management of CAD, HTN and Hyperlipidemia Healthy heart diet,  encouraged exercise  at least 4 times per week  The patient's NIHSS score is as follows: 1 Mild: 1 - 5 Mild to Moderately Severe: 5 - 14 Severe: 15 - 24 Very Severe: >25  Follow up in 6 months with duplex ultrasound and physical exam based on the patient's carotid surgery and <50% stenosis of the left carotid artery  - VAS US  CAROTID; Future       Cordella Shawl 06/13/2023, 2:43 PM   This note was created with Dragon medical transcription system.  Any errors from dictation are unintentional.

## 2023-06-14 ENCOUNTER — Ambulatory Visit (INDEPENDENT_AMBULATORY_CARE_PROVIDER_SITE_OTHER): Payer: No Typology Code available for payment source

## 2023-06-14 ENCOUNTER — Ambulatory Visit (INDEPENDENT_AMBULATORY_CARE_PROVIDER_SITE_OTHER): Payer: No Typology Code available for payment source | Admitting: Vascular Surgery

## 2023-06-14 ENCOUNTER — Encounter (INDEPENDENT_AMBULATORY_CARE_PROVIDER_SITE_OTHER): Payer: Self-pay | Admitting: Vascular Surgery

## 2023-06-14 VITALS — BP 165/81 | HR 74 | Resp 18 | Ht 63.0 in | Wt 166.0 lb

## 2023-06-14 DIAGNOSIS — I6523 Occlusion and stenosis of bilateral carotid arteries: Secondary | ICD-10-CM | POA: Diagnosis not present

## 2023-06-21 ENCOUNTER — Other Ambulatory Visit: Payer: Self-pay | Admitting: Neurosurgery

## 2023-07-02 DIAGNOSIS — B351 Tinea unguium: Secondary | ICD-10-CM | POA: Diagnosis not present

## 2023-07-02 DIAGNOSIS — L03012 Cellulitis of left finger: Secondary | ICD-10-CM | POA: Diagnosis not present

## 2023-07-19 DIAGNOSIS — Z1211 Encounter for screening for malignant neoplasm of colon: Secondary | ICD-10-CM | POA: Diagnosis not present

## 2023-07-19 DIAGNOSIS — N1831 Chronic kidney disease, stage 3a: Secondary | ICD-10-CM | POA: Diagnosis not present

## 2023-07-19 DIAGNOSIS — Z953 Presence of xenogenic heart valve: Secondary | ICD-10-CM | POA: Diagnosis not present

## 2023-07-19 DIAGNOSIS — R197 Diarrhea, unspecified: Secondary | ICD-10-CM | POA: Diagnosis not present

## 2023-07-19 DIAGNOSIS — I10 Essential (primary) hypertension: Secondary | ICD-10-CM | POA: Diagnosis not present

## 2023-07-19 DIAGNOSIS — R195 Other fecal abnormalities: Secondary | ICD-10-CM | POA: Diagnosis not present

## 2023-07-19 DIAGNOSIS — E1142 Type 2 diabetes mellitus with diabetic polyneuropathy: Secondary | ICD-10-CM | POA: Diagnosis not present

## 2023-07-19 DIAGNOSIS — M48062 Spinal stenosis, lumbar region with neurogenic claudication: Secondary | ICD-10-CM | POA: Diagnosis not present

## 2023-07-19 DIAGNOSIS — Z2821 Immunization not carried out because of patient refusal: Secondary | ICD-10-CM | POA: Diagnosis not present

## 2023-07-19 DIAGNOSIS — G63 Polyneuropathy in diseases classified elsewhere: Secondary | ICD-10-CM | POA: Diagnosis not present

## 2023-07-22 DIAGNOSIS — K921 Melena: Secondary | ICD-10-CM | POA: Diagnosis not present

## 2023-07-22 DIAGNOSIS — D509 Iron deficiency anemia, unspecified: Secondary | ICD-10-CM | POA: Diagnosis not present

## 2023-07-22 DIAGNOSIS — Z8719 Personal history of other diseases of the digestive system: Secondary | ICD-10-CM | POA: Diagnosis not present

## 2023-07-24 ENCOUNTER — Emergency Department: Payer: No Typology Code available for payment source

## 2023-07-24 ENCOUNTER — Other Ambulatory Visit: Payer: Self-pay

## 2023-07-24 ENCOUNTER — Emergency Department
Admission: EM | Admit: 2023-07-24 | Discharge: 2023-07-24 | Disposition: A | Discharge status: Home / Self Care | Payer: No Typology Code available for payment source | Attending: Emergency Medicine | Admitting: Emergency Medicine

## 2023-07-24 DIAGNOSIS — N189 Chronic kidney disease, unspecified: Secondary | ICD-10-CM | POA: Insufficient documentation

## 2023-07-24 DIAGNOSIS — M79604 Pain in right leg: Secondary | ICD-10-CM | POA: Diagnosis not present

## 2023-07-24 DIAGNOSIS — I2510 Atherosclerotic heart disease of native coronary artery without angina pectoris: Secondary | ICD-10-CM | POA: Diagnosis not present

## 2023-07-24 DIAGNOSIS — E1122 Type 2 diabetes mellitus with diabetic chronic kidney disease: Secondary | ICD-10-CM | POA: Diagnosis not present

## 2023-07-24 DIAGNOSIS — M79651 Pain in right thigh: Secondary | ICD-10-CM | POA: Insufficient documentation

## 2023-07-24 MED ORDER — CYCLOBENZAPRINE HCL 5 MG PO TABS: 5.0000 mg | ORAL_TABLET | Freq: Three times a day (TID) | ORAL | 0 refills | Status: DC | PRN

## 2023-07-24 MED ORDER — KETOROLAC TROMETHAMINE 15 MG/ML IJ SOLN
15.0000 mg | Freq: Once | INTRAMUSCULAR | Status: AC
  Administered 2023-07-24: 15 mg via INTRAMUSCULAR
  Filled 2023-07-24: qty 1

## 2023-07-24 MED ORDER — NAPROXEN 500 MG PO TABS: 500.0000 mg | ORAL_TABLET | Freq: Two times a day (BID) | ORAL | 2 refills | Status: DC

## 2023-07-24 NOTE — ED Provider Notes (Signed)
[facility] Provider Note    Event Date/Time   First MD Initiated Contact with Patient 07/24/23 [phone]     (approximate)   History   Back Pain and Leg Pain   HPI  Renee [name] is a 73 y.o. female with history of CKD, CAD, diabetes, GERD who presents with complaints of right thigh pain, she reports it has been hurting intermittently for about a month, seems to have worsened in the last few days.  She is able to ambulate with her walker but it is painful.  No significant back pain.  No neurodeficits     Physical Exam   Triage Vital Signs: ED Triage Vitals  Encounter Vitals Group     BP 07/24/23 0837 (!) 167/80     Systolic BP Percentile --      Diastolic BP Percentile --      Pulse Rate 07/24/23 0835 75     Resp 07/24/23 0835 20     Temp 07/24/23 0835 98.5 F (36.9 C)     Temp src --      SpO2 07/24/23 0835 96 %     Weight 07/24/23 0836 75.8 kg (167 lb)     Height 07/24/23 0836 1.6 m (5\' 3" )     Head Circumference --      Peak Flow --      Pain Score 07/24/23 0836 10     Pain Loc --      Pain Education --      Exclude from Growth Chart --     Most recent vital signs: Vitals:   07/24/23 0835 07/24/23 0837  BP:  (!) 167/80  Pulse: 75   Resp: 20   Temp: 98.5 F (36.9 C)   SpO2: 96%      General: Awake, no distress.  CV:  Good peripheral perfusion.  Resp:  Normal effort.  Abd:  No distention.  Other:  Tenderness in the posterior thigh, no swelling redness signs of infection, warm and well-perfused distally.  No calf pain or swelling   ED Results / Procedures / Treatments   Labs (all labs ordered are listed, but only abnormal results are displayed) Labs Reviewed - No data to display   EKG     RADIOLOGY Ultrasound negative for DVT    PROCEDURES:  Critical Care performed:   Procedures   MEDICATIONS ORDERED IN ED: Medications  ketorolac (TORADOL) 15 MG/ML injection 15 mg (15 mg Intramuscular Given 07/24/23 0927)      IMPRESSION / MDM / ASSESSMENT AND PLAN / ED COURSE  I reviewed the triage vital signs and the nursing notes. Patient's presentation is most consistent with acute presentation with potential threat to life or bodily function.  Patient presents with unilateral leg pain as detailed above, differential includes musculoskeletal pain, strain, DVT, sciatica  Will obtain ultrasound to rule out DVT, will treat with low-dose IM Toradol  Ultrasound is negative for DVT, suspect muscle strain outpatient follow-up with Ortho recommended, analgesics provided      FINAL CLINICAL IMPRESSION(S) / ED DIAGNOSES   Final diagnoses:  Right leg pain     Rx / DC Orders   ED Discharge Orders          Ordered    naproxen (NAPROSYN) 500 MG tablet  2 times daily with meals        07/24/23 1053    cyclobenzaprine (FLEXERIL) 5 MG tablet  3 times daily PRN  07/24/23 1053             Note:  This document was prepared using Dragon voice recognition software and may include unintentional dictation errors.   Jene Every, MD 07/24/23 1233

## 2023-07-24 NOTE — ED Triage Notes (Signed)
Pt to ED for right posterior thigh pain and lower back pain x1 month. Reports pain worsens with ambulation. Denies injuries.

## 2023-08-01 ENCOUNTER — Other Ambulatory Visit: Payer: Self-pay | Admitting: Neurosurgery

## 2023-08-11 DIAGNOSIS — E7800 Pure hypercholesterolemia, unspecified: Secondary | ICD-10-CM | POA: Diagnosis not present

## 2023-08-11 DIAGNOSIS — I6523 Occlusion and stenosis of bilateral carotid arteries: Secondary | ICD-10-CM | POA: Diagnosis not present

## 2023-08-11 DIAGNOSIS — E1142 Type 2 diabetes mellitus with diabetic polyneuropathy: Secondary | ICD-10-CM | POA: Diagnosis not present

## 2023-08-11 DIAGNOSIS — Z953 Presence of xenogenic heart valve: Secondary | ICD-10-CM | POA: Diagnosis not present

## 2023-08-16 ENCOUNTER — Telehealth (INDEPENDENT_AMBULATORY_CARE_PROVIDER_SITE_OTHER): Payer: Self-pay

## 2023-08-16 NOTE — Telephone Encounter (Signed)
 Patient reach out to the office stating that she been having numbness on the right side of her face since her surgery on 05/14/2023. Patient had right carotid endarterectomy and was last seen in the office 06/2023. I spoke with Vivia Birmingham NP and she advised that the numbness is to be expected especially if around the jaw or neck. The numbness could take months to subside. Patient was notified with medical recommendations and verbalized understanding.

## 2023-08-18 DIAGNOSIS — Z2821 Immunization not carried out because of patient refusal: Secondary | ICD-10-CM | POA: Diagnosis not present

## 2023-08-18 DIAGNOSIS — E1142 Type 2 diabetes mellitus with diabetic polyneuropathy: Secondary | ICD-10-CM | POA: Diagnosis not present

## 2023-08-18 DIAGNOSIS — K921 Melena: Secondary | ICD-10-CM | POA: Diagnosis not present

## 2023-08-18 DIAGNOSIS — Z8719 Personal history of other diseases of the digestive system: Secondary | ICD-10-CM | POA: Diagnosis not present

## 2023-08-18 DIAGNOSIS — N1831 Chronic kidney disease, stage 3a: Secondary | ICD-10-CM | POA: Diagnosis not present

## 2023-08-18 DIAGNOSIS — Z953 Presence of xenogenic heart valve: Secondary | ICD-10-CM | POA: Diagnosis not present

## 2023-08-18 DIAGNOSIS — Z1211 Encounter for screening for malignant neoplasm of colon: Secondary | ICD-10-CM | POA: Diagnosis not present

## 2023-08-18 DIAGNOSIS — G63 Polyneuropathy in diseases classified elsewhere: Secondary | ICD-10-CM | POA: Diagnosis not present

## 2023-08-18 DIAGNOSIS — E7800 Pure hypercholesterolemia, unspecified: Secondary | ICD-10-CM | POA: Diagnosis not present

## 2023-08-18 DIAGNOSIS — I2510 Atherosclerotic heart disease of native coronary artery without angina pectoris: Secondary | ICD-10-CM | POA: Diagnosis not present

## 2023-08-18 DIAGNOSIS — I6523 Occlusion and stenosis of bilateral carotid arteries: Secondary | ICD-10-CM | POA: Diagnosis not present

## 2023-08-18 DIAGNOSIS — M48062 Spinal stenosis, lumbar region with neurogenic claudication: Secondary | ICD-10-CM | POA: Diagnosis not present

## 2023-08-18 DIAGNOSIS — I10 Essential (primary) hypertension: Secondary | ICD-10-CM | POA: Diagnosis not present

## 2023-08-18 DIAGNOSIS — Z0000 Encounter for general adult medical examination without abnormal findings: Secondary | ICD-10-CM | POA: Diagnosis not present

## 2023-08-28 ENCOUNTER — Other Ambulatory Visit: Payer: Self-pay | Admitting: Neurosurgery

## 2023-08-30 ENCOUNTER — Other Ambulatory Visit: Payer: Self-pay | Admitting: Orthopedic Surgery

## 2023-08-30 DIAGNOSIS — Z955 Presence of coronary angioplasty implant and graft: Secondary | ICD-10-CM | POA: Diagnosis not present

## 2023-08-30 DIAGNOSIS — E66811 Obesity, class 1: Secondary | ICD-10-CM | POA: Diagnosis not present

## 2023-08-30 DIAGNOSIS — M9933 Osseous stenosis of neural canal of lumbar region: Secondary | ICD-10-CM | POA: Diagnosis not present

## 2023-08-30 DIAGNOSIS — K219 Gastro-esophageal reflux disease without esophagitis: Secondary | ICD-10-CM | POA: Diagnosis not present

## 2023-08-30 DIAGNOSIS — M1711 Unilateral primary osteoarthritis, right knee: Secondary | ICD-10-CM | POA: Diagnosis not present

## 2023-08-30 DIAGNOSIS — I739 Peripheral vascular disease, unspecified: Secondary | ICD-10-CM | POA: Diagnosis not present

## 2023-08-30 DIAGNOSIS — I10 Essential (primary) hypertension: Secondary | ICD-10-CM | POA: Diagnosis not present

## 2023-08-30 DIAGNOSIS — M4807 Spinal stenosis, lumbosacral region: Secondary | ICD-10-CM

## 2023-08-30 DIAGNOSIS — I2510 Atherosclerotic heart disease of native coronary artery without angina pectoris: Secondary | ICD-10-CM | POA: Diagnosis not present

## 2023-08-30 DIAGNOSIS — Z953 Presence of xenogenic heart valve: Secondary | ICD-10-CM | POA: Diagnosis not present

## 2023-08-30 DIAGNOSIS — M25561 Pain in right knee: Secondary | ICD-10-CM

## 2023-08-30 DIAGNOSIS — Z6830 Body mass index (BMI) 30.0-30.9, adult: Secondary | ICD-10-CM | POA: Diagnosis not present

## 2023-08-30 DIAGNOSIS — Z87891 Personal history of nicotine dependence: Secondary | ICD-10-CM | POA: Diagnosis not present

## 2023-08-30 DIAGNOSIS — N183 Chronic kidney disease, stage 3 unspecified: Secondary | ICD-10-CM | POA: Diagnosis not present

## 2023-08-30 DIAGNOSIS — E1142 Type 2 diabetes mellitus with diabetic polyneuropathy: Secondary | ICD-10-CM | POA: Diagnosis not present

## 2023-08-30 DIAGNOSIS — E782 Mixed hyperlipidemia: Secondary | ICD-10-CM | POA: Diagnosis not present

## 2023-09-08 ENCOUNTER — Ambulatory Visit
Admission: RE | Admit: 2023-09-08 | Discharge: 2023-09-08 | Disposition: A | Discharge status: Home / Self Care | Source: Ambulatory Visit | Attending: Orthopedic Surgery | Admitting: Orthopedic Surgery

## 2023-09-08 DIAGNOSIS — M4807 Spinal stenosis, lumbosacral region: Secondary | ICD-10-CM | POA: Insufficient documentation

## 2023-09-08 DIAGNOSIS — M47816 Spondylosis without myelopathy or radiculopathy, lumbar region: Secondary | ICD-10-CM | POA: Diagnosis not present

## 2023-09-08 DIAGNOSIS — M25561 Pain in right knee: Secondary | ICD-10-CM | POA: Diagnosis not present

## 2023-09-08 DIAGNOSIS — M79604 Pain in right leg: Secondary | ICD-10-CM | POA: Diagnosis not present

## 2023-09-08 DIAGNOSIS — M4187 Other forms of scoliosis, lumbosacral region: Secondary | ICD-10-CM | POA: Diagnosis not present

## 2023-09-08 DIAGNOSIS — I700 Atherosclerosis of aorta: Secondary | ICD-10-CM | POA: Diagnosis not present

## 2023-09-20 ENCOUNTER — Other Ambulatory Visit: Payer: Self-pay | Admitting: Neurosurgery

## 2023-09-28 DIAGNOSIS — M5416 Radiculopathy, lumbar region: Secondary | ICD-10-CM | POA: Diagnosis not present

## 2023-09-28 DIAGNOSIS — M5441 Lumbago with sciatica, right side: Secondary | ICD-10-CM | POA: Diagnosis not present

## 2023-09-28 DIAGNOSIS — G8929 Other chronic pain: Secondary | ICD-10-CM | POA: Diagnosis not present

## 2023-09-30 DIAGNOSIS — R198 Other specified symptoms and signs involving the digestive system and abdomen: Secondary | ICD-10-CM | POA: Diagnosis not present

## 2023-09-30 DIAGNOSIS — D649 Anemia, unspecified: Secondary | ICD-10-CM | POA: Diagnosis not present

## 2023-09-30 DIAGNOSIS — K219 Gastro-esophageal reflux disease without esophagitis: Secondary | ICD-10-CM | POA: Diagnosis not present

## 2023-10-14 DIAGNOSIS — M5416 Radiculopathy, lumbar region: Secondary | ICD-10-CM | POA: Diagnosis not present

## 2023-10-14 DIAGNOSIS — E1142 Type 2 diabetes mellitus with diabetic polyneuropathy: Secondary | ICD-10-CM | POA: Diagnosis not present

## 2023-10-20 DIAGNOSIS — M5416 Radiculopathy, lumbar region: Secondary | ICD-10-CM | POA: Diagnosis not present

## 2023-10-20 DIAGNOSIS — G8929 Other chronic pain: Secondary | ICD-10-CM | POA: Diagnosis not present

## 2023-10-20 DIAGNOSIS — M5441 Lumbago with sciatica, right side: Secondary | ICD-10-CM | POA: Diagnosis not present

## 2023-10-22 ENCOUNTER — Ambulatory Visit: Payer: No Typology Code available for payment source

## 2023-10-22 ENCOUNTER — Other Ambulatory Visit (HOSPITAL_COMMUNITY): Payer: No Typology Code available for payment source

## 2023-10-25 ENCOUNTER — Encounter: Payer: Self-pay | Admitting: Physician Assistant

## 2023-10-25 ENCOUNTER — Ambulatory Visit (HOSPITAL_COMMUNITY)
Admission: RE | Admit: 2023-10-25 | Discharge: 2023-10-25 | Disposition: A | Discharge status: Home / Self Care | Source: Ambulatory Visit | Attending: Internal Medicine | Admitting: Internal Medicine

## 2023-10-25 ENCOUNTER — Ambulatory Visit: Payer: Self-pay | Admitting: Physician Assistant

## 2023-10-25 ENCOUNTER — Ambulatory Visit: Attending: Physician Assistant | Admitting: Physician Assistant

## 2023-10-25 VITALS — BP 114/72 | HR 71 | Ht 63.0 in | Wt 186.0 lb

## 2023-10-25 DIAGNOSIS — Z952 Presence of prosthetic heart valve: Secondary | ICD-10-CM | POA: Diagnosis not present

## 2023-10-25 DIAGNOSIS — K31811 Angiodysplasia of stomach and duodenum with bleeding: Secondary | ICD-10-CM | POA: Diagnosis not present

## 2023-10-25 DIAGNOSIS — Z9889 Other specified postprocedural states: Secondary | ICD-10-CM

## 2023-10-25 DIAGNOSIS — Z951 Presence of aortocoronary bypass graft: Secondary | ICD-10-CM

## 2023-10-25 DIAGNOSIS — Z720 Tobacco use: Secondary | ICD-10-CM

## 2023-10-25 DIAGNOSIS — I10 Essential (primary) hypertension: Secondary | ICD-10-CM

## 2023-10-25 DIAGNOSIS — R911 Solitary pulmonary nodule: Secondary | ICD-10-CM

## 2023-10-25 LAB — ECHOCARDIOGRAM COMPLETE
AR max vel: 1.38 cm2
AV Area VTI: 1.54 cm2
AV Area mean vel: 1.35 cm2
AV Mean grad: 11 mmHg
AV Peak grad: 18.8 mmHg
Ao pk vel: 2.17 m/s
Area-P 1/2: 4.21 cm2
S' Lateral: 3.1 cm

## 2023-10-25 NOTE — Progress Notes (Signed)
 [facility]   MULTIDISCIPLINARY HEART VALVE CLINIC                                     Cardiology Office Note:    Date:  10/25/2023   ID:  Renee [name], DOB 11/06/1950, MRN [id number]  PCP:  Melchor Spoon, MD  [facility] Cardiologist:  None Dr Beau Bound Ivette Marks) [facility] Structural heart: Arnoldo Lapping, MD [facility] Electrophysiologist:  None   Referring MD: Melchor Spoon, MD   1 year s/p TAVR  History of Present Illness:    Renee [name] is a 73 y.o. female with a hx of CABG x 1 in 1996, HTN, CKD stage III, T2DM, tobacco abuse, GI bleeding, and severe aortic stenosis s/p TAVR (10/13/22) who presents to clinic for follow up.    She has a longstanding heart murmur and she has been followed by Dr. Beau Bound for moderate aortic stenosis. The patient has a remote history of CABG x 1 with a LIMA to the LAD by Dr. Sherene Dilling in 1996. The patient was hospitalized in 07/2022 with melena and lower GI bleeding. 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 She was placed on a PPI empirically. Echo 09/15/22 demonstrated worsening of her aortic stenosis with a mean grad 38 mmHg, AVA 0.99 cm2, and mild AI as well as mild-mod TR and EF >75%. [facility] 09/12/22 showed widely patent coronary arteries with no obstructive CAD and continued patency of the LIMA to LAD graft. S/p TAVR with a 23 mm Edwards Sapien 3 Ultra Resilia THV via the TF approach on 10/13/22. Post operative echo showed EF >75%, normally functioning TAVR with a mean gradient of 10 mmHg and no PVL as well as mild to moderate TR. She was discharged on a baby aspirin  81mg  daily.   Had TIA and found to have critical carotid stenosis s/p R CEA on 05/15/23. Started on Plavix  75 mg daily.     Today the patient presents to clinic for follow up. Has had back issues that limits her mobility. She has had two injections. Most activity she  gets is doing the laundry and walking around. No CP or SOB. No LE edema, orthopnea or PND. No dizziness or syncope. No blood in stool or urine. No palpitations.    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 Arlester Ladd & Dr. Sherene Dilling   Severe aortic stenosis      Current Medications: Current Meds  Medication Sig   ACCU-CHEK GUIDE test strip USE TO TEST SUGAR DAILY   Accu-Chek Softclix Lancets lancets daily.   acetaminophen  (TYLENOL ) 500 MG tablet Take 500 mg by mouth every 6 (six) hours as needed for headache or fever.   Alpha-Lipoic Acid 600 MG CAPS Take 600 mg by mouth daily.   amLODipine  (NORVASC ) 5 MG tablet Take 5 mg by mouth daily.   amoxicillin  (AMOXIL ) 500 MG tablet Take 4 tablets (2,000 mg  total) by mouth as directed. 1 hour prior to dental work including cleanings   aspirin  EC 81 MG tablet Take 81 mg by mouth daily. Swallow whole.   Azelastine HCl 137 MCG/SPRAY SOLN Place 1 spray into the nose 2 (two) times daily as needed (allergies).   Blood Glucose Monitoring Suppl (ACCU-CHEK GUIDE ME) w/Device KIT USE AS DIRECTED TO TEST SUGAR DAILY   carvedilol  (COREG ) 6.25 MG tablet Take 6.25 mg by mouth 2 (two) times a day.    Cholecalciferol  (VITAMIN D3) 50 MCG (2000 UT) TABS Take 2,000 Units by mouth daily.   CINNAMON  PO Take 1,000 mg by mouth daily.   clopidogrel  (PLAVIX ) 75 MG tablet Take 1 tablet (75 mg total) by mouth daily at 6 (six) AM.   CVS SENNA 8.6 MG tablet TAKE 1 TABLET (8.6 MG TOTAL) BY MOUTH DAILY AS NEEDED FOR MILD CONSTIPATION.   cyclobenzaprine  (FLEXERIL ) 5 MG tablet Take 1 tablet (5 mg total) by mouth 3 (three) times daily as needed for muscle spasms.    EPINEPHrine  0.3 mg/0.3 mL IJ SOAJ injection Inject 0.3 mg into the muscle as needed for anaphylaxis.   furosemide  (LASIX ) 20 MG tablet Take 20 mg by mouth once a week. In the morning   gabapentin  (NEURONTIN ) 800 MG tablet Take 800-1,600 mg by mouth See admin instructions. Take 1 tablet (800 mg) by mouth in the morning, 1 tablet (800 mg) by mouth at noon, & take 2 tablets (1600 mg) by mouth at bedtime.   loratadine  (CLARITIN ) 10 MG tablet Take 10 mg by mouth daily as needed for allergies.   naproxen  (NAPROSYN ) 500 MG tablet Take 1 tablet (500 mg total) by mouth 2 (two) times daily with a meal.   NEOMYCIN -POLYMYXIN-HYDROCORTISONE (CORTISPORIN) 1 % SOLN OTIC solution Place 3 drops into the right ear 4 (four) times daily.   nitroGLYCERIN  (NITROSTAT ) 0.4 MG SL tablet Place 0.4 mg under the tongue every 5 (five) minutes as needed for chest pain.   Omega-3 Fatty Acids (FISH OIL) 1000 MG CAPS Take 1,000 mg by mouth 3 (three) times daily.   potassium chloride  SA (KLOR-CON  M) 20 MEQ tablet Take 20 mEq by mouth daily.   Propylene Glycol (SYSTANE BALANCE) 0.6 % SOLN Place 1 drop into both eyes daily.   rosuvastatin  (CRESTOR ) 20 MG tablet Take 20 mg by mouth at bedtime.   triamcinolone  cream (KENALOG ) 0.5 % Apply 1 Application topically daily as needed (irritation).      ROS:   Please see the history of present illness.    All other systems reviewed and are negative.  EKGs       Risk Assessment/Calculations:           Physical Exam:    VS:  BP 114/72   Pulse 71   Ht 5\' 3"  (1.6 m)   Wt 186 lb (84.4 kg)   SpO2 99%   BMI 32.95 kg/m     Wt Readings from Last 3 Encounters:  10/25/23 186 lb (84.4 kg)  07/24/23 167 lb (75.8 kg)  06/14/23 166 lb (75.3 kg)     GEN: Well nourished, well developed in no acute distress NECK: No JVD CARDIAC: RRR, no murmurs, rubs, gallops RESPIRATORY:  Clear to auscultation without rales, wheezing or rhonchi  ABDOMEN: Soft, non-tender,  non-distended EXTREMITIES:  No edema; No deformity.   ASSESSMENT:    1. S/P TAVR (transcatheter aortic valve replacement)   2. Hx of CABG   3. Essential hypertension   4. 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 Pulmonary nodule   6. Tobacco abuse   7. S/P carotid endarterectomy     PLAN:    In order of problems listed above:  Severe AS s/p TAVR:  -- Echo today shows EF 70%, normally functioning TAVR with a mean gradient of 9 mm hg and no PVL as well as mild to mod TR.  -- NYHA class I symptoms. Limited mostly by her back pain. -- Continue Aspirin  81mg  daily.   -- SBE discussed; she has amoxicillin . -- Continue regular follow up with Dr. Beau Bound.    CAD s/p CABGx1V:  -- [facility] 09/12/22 showed widely patent coronary arteries with no obstructive CAD and continued patency of the LIMA to LAD graft. -- Continue aspirin  81 mg daily. -- Continue Crestor  20 mg daily.     HTN:  -- BP well controlled.  -- Continue Norvasc  5mg  daily,    Previous GI bleeding with anemia:  -- Hg has remained stable ~11.  -- No s/s bleeding.    Pulmonary nodule:  -- Pre TAVR CT showed a "small solid pulmonary nodule of the right upper lobe measuring 3 mm. No follow-up needed if patient is low-risk. Non-contrast chest CT can be considered in 12 months if patient is high-risk."    -- Given long smoking history, will get this set up.   Tobacco abuse:  -- She has quit.  Sp R CEA: -- Now on DAPT with aspirin  81mg  daily and Plavix  75 mg daily. -- Continue follow up with vascular.  -- Continue Crestor  20mg  daily. -- Congratulated her on smoking cessation.   Medication Adjustments/Labs and Tests Ordered: Current medicines are reviewed at length with the patient today.  Concerns regarding medicines are outlined above.  Orders Placed This Encounter  Procedures   CT CHEST WO CONTRAST   No orders of the defined types were placed in this encounter.   Patient Instructions   Medication Instructions:  Your physician recommends that you continue on your current medications as directed. Please refer to the Current Medication list given to you today.  *If you need a refill on your cardiac medications before your next appointment, please call your pharmacy*  Lab Work: None If you have labs (blood work) drawn today and your tests are completely normal, you will receive your results only by: MyChart Message (if you have MyChart) OR A paper copy in the mail If you have any lab test that is abnormal or we need to change your treatment, we will call you to review the results.  Testing/Procedures: CT scan of your lungs  Follow-Up: At [facility], you and your health needs are our priority.  As part of our continuing mission to provide you with exceptional heart care, our providers are all part of one team.  This team includes your primary Cardiologist (physician) and Advanced Practice Providers or APPs (Physician Assistants and Nurse Practitioners) who all work together to provide you with the care you need, when you need it.  Your next appointment:   Follow up with Dr. Beau Bound        Signed, Abagail Hoar, PA-C  10/25/2023 6:01 PM    [facility]

## 2023-10-25 NOTE — Patient Instructions (Addendum)
 Medication Instructions:  Your physician recommends that you continue on your current medications as directed. Please refer to the Current Medication list given to you today.  *If you need a refill on your cardiac medications before your next appointment, please call your pharmacy*  Lab Work: None If you have labs (blood work) drawn today and your tests are completely normal, you will receive your results only by: MyChart Message (if you have MyChart) OR A paper copy in the mail If you have any lab test that is abnormal or we need to change your treatment, we will call you to review the results.  Testing/Procedures: CT scan of your lungs  Follow-Up: At [facility], you and your health needs are our priority.  As part of our continuing mission to provide you with exceptional heart care, our providers are all part of one team.  This team includes your primary Cardiologist (physician) and Advanced Practice Providers or APPs (Physician Assistants and Nurse Practitioners) who all work together to provide you with the care you need, when you need it.  Your next appointment:   Follow up with Dr. Beau Bound

## 2023-10-27 ENCOUNTER — Telehealth: Payer: Self-pay | Admitting: Physician Assistant

## 2023-10-27 NOTE — Telephone Encounter (Signed)
 New Message:      Patient said she was told to let you know, she is having her CT Test at [facility], not at [facility].

## 2023-11-03 ENCOUNTER — Ambulatory Visit
Admission: RE | Admit: 2023-11-03 | Discharge: 2023-11-03 | Disposition: A | Discharge status: Home / Self Care | Source: Ambulatory Visit | Attending: Physician Assistant | Admitting: Physician Assistant

## 2023-11-03 DIAGNOSIS — R918 Other nonspecific abnormal finding of lung field: Secondary | ICD-10-CM | POA: Diagnosis not present

## 2023-11-03 DIAGNOSIS — R911 Solitary pulmonary nodule: Secondary | ICD-10-CM | POA: Insufficient documentation

## 2023-11-08 ENCOUNTER — Ambulatory Visit: Payer: Self-pay | Admitting: Physician Assistant

## 2023-11-18 DIAGNOSIS — M545 Low back pain, unspecified: Secondary | ICD-10-CM | POA: Diagnosis not present

## 2023-11-18 DIAGNOSIS — G8929 Other chronic pain: Secondary | ICD-10-CM | POA: Diagnosis not present

## 2023-11-23 DIAGNOSIS — M545 Low back pain, unspecified: Secondary | ICD-10-CM | POA: Diagnosis not present

## 2023-11-23 DIAGNOSIS — G8929 Other chronic pain: Secondary | ICD-10-CM | POA: Diagnosis not present

## 2023-11-30 DIAGNOSIS — G8929 Other chronic pain: Secondary | ICD-10-CM | POA: Diagnosis not present

## 2023-11-30 DIAGNOSIS — M545 Low back pain, unspecified: Secondary | ICD-10-CM | POA: Diagnosis not present

## 2023-12-07 DIAGNOSIS — M545 Low back pain, unspecified: Secondary | ICD-10-CM | POA: Diagnosis not present

## 2023-12-07 DIAGNOSIS — G8929 Other chronic pain: Secondary | ICD-10-CM | POA: Diagnosis not present

## 2023-12-13 ENCOUNTER — Ambulatory Visit (INDEPENDENT_AMBULATORY_CARE_PROVIDER_SITE_OTHER): Payer: No Typology Code available for payment source | Admitting: Vascular Surgery

## 2023-12-13 ENCOUNTER — Encounter (INDEPENDENT_AMBULATORY_CARE_PROVIDER_SITE_OTHER): Payer: No Typology Code available for payment source

## 2023-12-22 ENCOUNTER — Other Ambulatory Visit (INDEPENDENT_AMBULATORY_CARE_PROVIDER_SITE_OTHER): Payer: Self-pay | Admitting: Vascular Surgery

## 2023-12-22 DIAGNOSIS — I6523 Occlusion and stenosis of bilateral carotid arteries: Secondary | ICD-10-CM

## 2023-12-25 DIAGNOSIS — I6529 Occlusion and stenosis of unspecified carotid artery: Secondary | ICD-10-CM | POA: Insufficient documentation

## 2023-12-25 NOTE — Progress Notes (Unsigned)
 MRN : [id number]  Renee [name] is a 73 y.o. ([birth year]) female who presents with chief complaint of check carotid arteries.  History of Present Illness:   The patient is seen for follow up evaluation of carotid stenosis status post right carotid endarterectomy on 05/14/2023.  There were no post operative problems or complications related to the surgery.  The patient denies neck or incisional pain.   The patient denies interval amaurosis fugax. There is no recent history of TIA symptoms or focal motor deficits. There is no prior documented CVA.   The patient denies headache.   The patient is taking enteric-coated aspirin  81 mg daily.   No recent shortening of the patient's walking distance or new symptoms consistent with claudication.  No history of rest pain symptoms. No new ulcers or wounds of the lower extremities have occurred.   There is no history of DVT, PE or superficial thrombophlebitis. No recent episodes of angina or shortness of breath documented.   Duplex ultrasound of the carotid arteries obtained today shows RICA 1-39% and LICA 40-59%  No outpatient medications have been marked as taking for the 12/27/23 encounter (Appointment) with Jama, Cordella MATSU, MD.    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 Wonda & Dr. Lucas   Severe aortic stenosis     Past Surgical History:  Procedure Laterality Date   ABDOMINAL HYSTERECTOMY     BREAST EXCISIONAL BIOPSY Right 20+ yrs ago   benign   CARDIAC SURGERY     cabg   CARPAL TUNNEL RELEASE Bilateral    COLONOSCOPY N/A 02/24/2017   Procedure: COLONOSCOPY;  Surgeon: Toledo, [name],  MD;  Location: [facility] ENDOSCOPY;  Service: Endoscopy;  Laterality: N/A;   COLONOSCOPY N/A 07/28/2022   Procedure: COLONOSCOPY;  Surgeon: Toledo, [name], MD;  Location: [facility] ENDOSCOPY;  Service: Gastroenterology;  Laterality: N/A;   CORONARY ARTERY BYPASS GRAFT     1996   CORONARY/GRAFT ANGIOGRAPHY N/A 09/15/2022   Procedure: CORONARY/GRAFT ANGIOGRAPHY;  Surgeon: [name], MD;  Location: [facility] INVASIVE CV LAB;  Service: Cardiovascular;  Laterality: N/A;   ENDARTERECTOMY Right 05/14/2023   Procedure: ENDARTERECTOMY CAROTID;  Surgeon: [name], MD;  Location: [facility] ORS;  Service: Vascular;  Laterality: Right;   ESOPHAGOGASTRODUODENOSCOPY (EGD) WITH PROPOFOL  N/A 07/27/2022   Procedure: ESOPHAGOGASTRODUODENOSCOPY (EGD) WITH PROPOFOL ;  Surgeon: Toledo, [name], MD;  Location: [facility] ENDOSCOPY;  Service: Gastroenterology;  Laterality: N/A;   HERNIA REPAIR     umbilical   INTRAOPERATIVE TRANSTHORACIC ECHOCARDIOGRAM N/A 10/13/2022   Procedure: INTRAOPERATIVE TRANSTHORACIC ECHOCARDIOGRAM;  Surgeon: [name], MD;  Location: [facility] INVASIVE CV LAB;  Service: Open Heart Surgery;  Laterality: N/A;   LUMBAR LAMINECTOMY/DECOMPRESSION MICRODISCECTOMY N/A 06/25/2021   Procedure: L2-3 DECOMPRESSION;  Surgeon: [name], MD;  Location: [facility] ORS;  Service: Neurosurgery;  Laterality: N/A;   TOTAL KNEE ARTHROPLASTY Left 10/25/2018   Procedure: TOTAL KNEE ARTHROPLASTY - LEFT - DIABETIC;  Surgeon: [name], MD;  Location: [facility] ORS;  Service:  Orthopedics;  Laterality: Left;   TRANSCATHETER AORTIC VALVE REPLACEMENT, TRANSFEMORAL Right 10/13/2022   Procedure: Transcatheter Aortic Valve Replacement, Transfemoral;  Surgeon: [name], MD;  Location: [facility] INVASIVE CV LAB;  Service: Open Heart Surgery;  Laterality: Right;    Social History Social History   Tobacco Use   Smoking status: Every Day    Current packs/day: 0.25    Average packs/day: 0.3 packs/day for 35.0 years (8.8 ttl pk-yrs)     Types: Cigarettes   Smokeless tobacco: Never  Vaping Use   Vaping status: Never Used  Substance Use Topics   Alcohol  use: Yes   Drug use: No    Family History Family History  Problem Relation Age of Onset   Breast cancer Neg Hx     Allergies  Allergen Reactions   Tizanidine      headaches   Venlafaxine Other (See Comments)    intolerant   Codeine Nausea And Vomiting   Tramadol Nausea And Vomiting     REVIEW OF SYSTEMS (Negative unless checked)  Constitutional: [] Weight loss  [] Fever  [] Chills Cardiac: [] Chest pain   [] Chest pressure   [] Palpitations   [] Shortness of breath when laying flat   [] Shortness of breath with exertion. Vascular:  [x] Pain in legs with walking   [] Pain in legs at rest  [] History of DVT   [] Phlebitis   [] Swelling in legs   [] Varicose veins   [] Non-healing ulcers Pulmonary:   [] Uses home oxygen   [] Productive cough   [] Hemoptysis   [] Wheeze  [] COPD   [] Asthma Neurologic:  [] Dizziness   [] Seizures   [] History of stroke   [] History of TIA  [] Aphasia   [] Vissual changes   [] Weakness or numbness in arm   [] Weakness or numbness in leg Musculoskeletal:   [] Joint swelling   [] Joint pain   [] Low back pain Hematologic:  [] Easy bruising  [] Easy bleeding   [] Hypercoagulable state   [] Anemic Gastrointestinal:  [] Diarrhea   [] Vomiting  [] Gastroesophageal reflux/heartburn   [] Difficulty swallowing. Genitourinary:  [] Chronic kidney disease   [] Difficult urination  [] Frequent urination   [] Blood in urine Skin:  [] Rashes   [] Ulcers  Psychological:  [] History of anxiety   []  History of major depression.  Physical Examination  There were no vitals filed for this visit. There is no height or weight on file to calculate BMI. Gen: WD/WN, NAD Head: [place]/AT, No temporalis wasting.  Ear/Nose/Throat: Hearing grossly intact, nares w/o erythema or drainage Eyes: PER, EOMI, sclera nonicteric.  Neck: Supple, no masses.  No bruit or JVD.  Pulmonary:  Good air movement, no audible  wheezing, no use of accessory muscles.  Cardiac: RRR, normal S1, S2, no Murmurs. Vascular:  carotid bruit noted right carotid endarterectomy incisional scar well-healed Vessel Right Left  Radial Palpable Palpable  Carotid  Palpable  Palpable  Gastrointestinal: soft, non-distended. No guarding/no peritoneal signs.  Musculoskeletal: M/S 5/5 throughout.  No visible deformity.  Neurologic: CN 2-12 intact. Pain and light touch intact in extremities.  Symmetrical.  Speech is fluent. Motor exam as listed above. Psychiatric: Judgment intact, Mood & affect appropriate for pt's clinical situation. Dermatologic: No rashes or ulcers noted.  No changes consistent with cellulitis.   CBC Lab Results  Component Value Date   WBC 6.9 05/15/2023   HGB 9.1 (L) 05/15/2023   HCT 27.4 (L) 05/15/2023   MCV 107.9 (H) 05/15/2023   PLT 141 (L) 05/15/2023    BMET    Component Value Date/Time   NA 137 05/15/2023 0435  NA 145 (H) 09/08/2022 1245   NA 141 10/22/2011 0340   K 4.0 05/15/2023 0435   K 3.7 02/03/2012 1219   CL 107 05/15/2023 0435   CL 108 (H) 10/22/2011 0340   CO2 25 05/15/2023 0435   CO2 25 10/22/2011 0340   GLUCOSE 121 (H) 05/15/2023 0435   GLUCOSE 85 10/22/2011 0340   BUN 13 05/15/2023 0435   BUN 21 09/08/2022 1245   BUN 30 (H) 10/22/2011 0340   CREATININE 0.82 05/15/2023 0435   CREATININE 1.38 (H) 10/22/2011 0340   CALCIUM  8.4 (L) 05/15/2023 0435   CALCIUM  8.1 (L) 10/22/2011 0340   GFRNONAA >60 05/15/2023 0435   GFRNONAA 41 (L) 10/22/2011 0340   GFRAA >60 10/28/2018 0445   GFRAA 47 (L) 10/22/2011 0340   CrCl cannot be calculated (Patient's most recent lab result is older than the maximum 21 days allowed.).  COAG Lab Results  Component Value Date   INR 1.2 10/09/2022   INR 1.2 07/26/2022   INR 1.1 06/17/2021    Radiology No results found.   Assessment/Plan 1. Bilateral carotid artery stenosis (Primary) Recommend:  She is status post right carotid endarterectomy on  05/14/2023  Given the patient's asymptomatic subcritical stenosis no further invasive testing or surgery at this time.  Duplex ultrasound shows RICA 1-39% and LICA 40-59% stenosis bilaterally.  Continue antiplatelet therapy as prescribed Continue management of CAD, HTN and Hyperlipidemia Healthy heart diet,  encouraged exercise at least 4 times per week  Follow up in 6 months with duplex ultrasound and physical exam  - VAS US  CAROTID; Future  2. Essential hypertension Continue antihypertensive medications as already ordered, these medications have been reviewed and there are no changes at this time.  3. CAD in native artery Continue cardiac and antihypertensive medications as already ordered and reviewed, no changes at this time.  Continue statin as ordered and reviewed, no changes at this time  Nitrates PRN for chest pain  4. Type 2 diabetes mellitus with diabetic polyneuropathy, without long-term current use of insulin  (HCC) Continue hypoglycemic medications as already ordered, these medications have been reviewed and there are no changes at this time.  Hgb A1C to be monitored as already arranged by primary service  5. Pure hypercholesterolemia Continue statin as ordered and reviewed, no changes at this time    Cordella Shawl, MD  12/25/2023 3:49 PM

## 2023-12-27 ENCOUNTER — Ambulatory Visit (INDEPENDENT_AMBULATORY_CARE_PROVIDER_SITE_OTHER)

## 2023-12-27 ENCOUNTER — Ambulatory Visit (INDEPENDENT_AMBULATORY_CARE_PROVIDER_SITE_OTHER): Admitting: Vascular Surgery

## 2023-12-27 ENCOUNTER — Encounter (INDEPENDENT_AMBULATORY_CARE_PROVIDER_SITE_OTHER): Payer: Self-pay | Admitting: Vascular Surgery

## 2023-12-27 VITALS — BP 107/68 | HR 71 | Ht 63.0 in | Wt 183.0 lb

## 2023-12-27 DIAGNOSIS — I6523 Occlusion and stenosis of bilateral carotid arteries: Secondary | ICD-10-CM | POA: Diagnosis not present

## 2023-12-27 DIAGNOSIS — I10 Essential (primary) hypertension: Secondary | ICD-10-CM

## 2023-12-27 DIAGNOSIS — I2510 Atherosclerotic heart disease of native coronary artery without angina pectoris: Secondary | ICD-10-CM

## 2023-12-27 DIAGNOSIS — E7800 Pure hypercholesterolemia, unspecified: Secondary | ICD-10-CM

## 2023-12-27 DIAGNOSIS — E1142 Type 2 diabetes mellitus with diabetic polyneuropathy: Secondary | ICD-10-CM | POA: Diagnosis not present

## 2024-01-05 DIAGNOSIS — M545 Low back pain, unspecified: Secondary | ICD-10-CM | POA: Diagnosis not present

## 2024-01-05 DIAGNOSIS — G8929 Other chronic pain: Secondary | ICD-10-CM | POA: Diagnosis not present

## 2024-01-12 DIAGNOSIS — G8929 Other chronic pain: Secondary | ICD-10-CM | POA: Diagnosis not present

## 2024-01-12 DIAGNOSIS — M545 Low back pain, unspecified: Secondary | ICD-10-CM | POA: Diagnosis not present

## 2024-01-18 ENCOUNTER — Other Ambulatory Visit: Payer: Self-pay | Admitting: Internal Medicine

## 2024-01-18 DIAGNOSIS — Z1231 Encounter for screening mammogram for malignant neoplasm of breast: Secondary | ICD-10-CM

## 2024-01-25 DIAGNOSIS — E119 Type 2 diabetes mellitus without complications: Secondary | ICD-10-CM | POA: Diagnosis not present

## 2024-01-25 DIAGNOSIS — Z961 Presence of intraocular lens: Secondary | ICD-10-CM | POA: Diagnosis not present

## 2024-01-25 DIAGNOSIS — H26491 Other secondary cataract, right eye: Secondary | ICD-10-CM | POA: Diagnosis not present

## 2024-01-26 DIAGNOSIS — M545 Low back pain, unspecified: Secondary | ICD-10-CM | POA: Diagnosis not present

## 2024-01-26 DIAGNOSIS — G8929 Other chronic pain: Secondary | ICD-10-CM | POA: Diagnosis not present

## 2024-02-02 DIAGNOSIS — M545 Low back pain, unspecified: Secondary | ICD-10-CM | POA: Diagnosis not present

## 2024-02-02 DIAGNOSIS — G8929 Other chronic pain: Secondary | ICD-10-CM | POA: Diagnosis not present

## 2024-02-08 DIAGNOSIS — M5416 Radiculopathy, lumbar region: Secondary | ICD-10-CM | POA: Diagnosis not present

## 2024-02-08 DIAGNOSIS — G8929 Other chronic pain: Secondary | ICD-10-CM | POA: Diagnosis not present

## 2024-02-08 DIAGNOSIS — M5441 Lumbago with sciatica, right side: Secondary | ICD-10-CM | POA: Diagnosis not present

## 2024-02-11 DIAGNOSIS — N1831 Chronic kidney disease, stage 3a: Secondary | ICD-10-CM | POA: Diagnosis not present

## 2024-02-11 DIAGNOSIS — E1142 Type 2 diabetes mellitus with diabetic polyneuropathy: Secondary | ICD-10-CM | POA: Diagnosis not present

## 2024-02-11 DIAGNOSIS — E7800 Pure hypercholesterolemia, unspecified: Secondary | ICD-10-CM | POA: Diagnosis not present

## 2024-02-18 ENCOUNTER — Encounter

## 2024-02-18 DIAGNOSIS — D72819 Decreased white blood cell count, unspecified: Secondary | ICD-10-CM | POA: Diagnosis not present

## 2024-02-18 DIAGNOSIS — M48062 Spinal stenosis, lumbar region with neurogenic claudication: Secondary | ICD-10-CM | POA: Diagnosis not present

## 2024-02-18 DIAGNOSIS — E1142 Type 2 diabetes mellitus with diabetic polyneuropathy: Secondary | ICD-10-CM | POA: Diagnosis not present

## 2024-02-18 DIAGNOSIS — K2951 Unspecified chronic gastritis with bleeding: Secondary | ICD-10-CM | POA: Diagnosis not present

## 2024-02-18 DIAGNOSIS — I10 Essential (primary) hypertension: Secondary | ICD-10-CM | POA: Diagnosis not present

## 2024-02-18 DIAGNOSIS — D649 Anemia, unspecified: Secondary | ICD-10-CM | POA: Diagnosis not present

## 2024-02-18 DIAGNOSIS — N1831 Chronic kidney disease, stage 3a: Secondary | ICD-10-CM | POA: Diagnosis not present

## 2024-02-18 DIAGNOSIS — Z953 Presence of xenogenic heart valve: Secondary | ICD-10-CM | POA: Diagnosis not present

## 2024-02-18 DIAGNOSIS — M545 Low back pain, unspecified: Secondary | ICD-10-CM | POA: Diagnosis not present

## 2024-02-18 DIAGNOSIS — E7800 Pure hypercholesterolemia, unspecified: Secondary | ICD-10-CM | POA: Diagnosis not present

## 2024-02-18 DIAGNOSIS — G63 Polyneuropathy in diseases classified elsewhere: Secondary | ICD-10-CM | POA: Diagnosis not present

## 2024-02-18 DIAGNOSIS — M2550 Pain in unspecified joint: Secondary | ICD-10-CM | POA: Diagnosis not present

## 2024-02-21 ENCOUNTER — Ambulatory Visit
Admission: RE | Admit: 2024-02-21 | Discharge: 2024-02-21 | Disposition: A | Discharge status: Home / Self Care | Source: Ambulatory Visit | Attending: Internal Medicine | Admitting: Internal Medicine

## 2024-02-21 DIAGNOSIS — Z1231 Encounter for screening mammogram for malignant neoplasm of breast: Secondary | ICD-10-CM | POA: Diagnosis not present

## 2024-02-29 DIAGNOSIS — M5416 Radiculopathy, lumbar region: Secondary | ICD-10-CM | POA: Diagnosis not present

## 2024-03-06 DIAGNOSIS — E782 Mixed hyperlipidemia: Secondary | ICD-10-CM | POA: Diagnosis not present

## 2024-03-06 DIAGNOSIS — Z87891 Personal history of nicotine dependence: Secondary | ICD-10-CM | POA: Diagnosis not present

## 2024-03-06 DIAGNOSIS — Z952 Presence of prosthetic heart valve: Secondary | ICD-10-CM | POA: Diagnosis not present

## 2024-03-06 DIAGNOSIS — I350 Nonrheumatic aortic (valve) stenosis: Secondary | ICD-10-CM | POA: Diagnosis not present

## 2024-03-06 DIAGNOSIS — K219 Gastro-esophageal reflux disease without esophagitis: Secondary | ICD-10-CM | POA: Diagnosis not present

## 2024-03-06 DIAGNOSIS — E1142 Type 2 diabetes mellitus with diabetic polyneuropathy: Secondary | ICD-10-CM | POA: Diagnosis not present

## 2024-03-06 DIAGNOSIS — I2510 Atherosclerotic heart disease of native coronary artery without angina pectoris: Secondary | ICD-10-CM | POA: Diagnosis not present

## 2024-03-06 DIAGNOSIS — I10 Essential (primary) hypertension: Secondary | ICD-10-CM | POA: Diagnosis not present

## 2024-03-06 DIAGNOSIS — E66811 Obesity, class 1: Secondary | ICD-10-CM | POA: Diagnosis not present

## 2024-03-06 DIAGNOSIS — R600 Localized edema: Secondary | ICD-10-CM | POA: Diagnosis not present

## 2024-03-06 DIAGNOSIS — N183 Chronic kidney disease, stage 3 unspecified: Secondary | ICD-10-CM | POA: Diagnosis not present

## 2024-03-06 DIAGNOSIS — M545 Low back pain, unspecified: Secondary | ICD-10-CM | POA: Diagnosis not present

## 2024-03-09 DIAGNOSIS — D509 Iron deficiency anemia, unspecified: Secondary | ICD-10-CM | POA: Diagnosis not present

## 2024-03-09 DIAGNOSIS — Z7902 Long term (current) use of antithrombotics/antiplatelets: Secondary | ICD-10-CM | POA: Diagnosis not present

## 2024-03-09 DIAGNOSIS — R1912 Hyperactive bowel sounds: Secondary | ICD-10-CM | POA: Diagnosis not present

## 2024-03-09 DIAGNOSIS — K921 Melena: Secondary | ICD-10-CM | POA: Diagnosis not present

## 2024-03-15 DIAGNOSIS — M5416 Radiculopathy, lumbar region: Secondary | ICD-10-CM | POA: Diagnosis not present

## 2024-03-15 DIAGNOSIS — G8929 Other chronic pain: Secondary | ICD-10-CM | POA: Diagnosis not present

## 2024-03-15 DIAGNOSIS — M5441 Lumbago with sciatica, right side: Secondary | ICD-10-CM | POA: Diagnosis not present

## 2024-03-21 ENCOUNTER — Encounter
Admission: RE | Disposition: A | Discharge status: Home / Self Care | Payer: Self-pay | Source: Home / Self Care | Attending: Gastroenterology

## 2024-03-21 ENCOUNTER — Ambulatory Visit
Admission: RE | Admit: 2024-03-21 | Discharge: 2024-03-21 | Disposition: A | Discharge status: Home / Self Care | Attending: Gastroenterology | Admitting: Gastroenterology

## 2024-03-21 ENCOUNTER — Ambulatory Visit: Admitting: Anesthesiology

## 2024-03-21 ENCOUNTER — Encounter: Payer: Self-pay | Admitting: Gastroenterology

## 2024-03-21 DIAGNOSIS — Z952 Presence of prosthetic heart valve: Secondary | ICD-10-CM | POA: Insufficient documentation

## 2024-03-21 DIAGNOSIS — J449 Chronic obstructive pulmonary disease, unspecified: Secondary | ICD-10-CM | POA: Diagnosis not present

## 2024-03-21 DIAGNOSIS — Z7984 Long term (current) use of oral hypoglycemic drugs: Secondary | ICD-10-CM | POA: Diagnosis not present

## 2024-03-21 DIAGNOSIS — E66813 Obesity, class 3: Secondary | ICD-10-CM | POA: Diagnosis not present

## 2024-03-21 DIAGNOSIS — I129 Hypertensive chronic kidney disease with stage 1 through stage 4 chronic kidney disease, or unspecified chronic kidney disease: Secondary | ICD-10-CM | POA: Diagnosis not present

## 2024-03-21 DIAGNOSIS — Z951 Presence of aortocoronary bypass graft: Secondary | ICD-10-CM | POA: Diagnosis not present

## 2024-03-21 DIAGNOSIS — Z87891 Personal history of nicotine dependence: Secondary | ICD-10-CM | POA: Diagnosis not present

## 2024-03-21 DIAGNOSIS — K219 Gastro-esophageal reflux disease without esophagitis: Secondary | ICD-10-CM | POA: Diagnosis not present

## 2024-03-21 DIAGNOSIS — N1831 Chronic kidney disease, stage 3a: Secondary | ICD-10-CM | POA: Diagnosis not present

## 2024-03-21 DIAGNOSIS — K56699 Other intestinal obstruction unspecified as to partial versus complete obstruction: Secondary | ICD-10-CM | POA: Insufficient documentation

## 2024-03-21 DIAGNOSIS — E1122 Type 2 diabetes mellitus with diabetic chronic kidney disease: Secondary | ICD-10-CM | POA: Insufficient documentation

## 2024-03-21 DIAGNOSIS — N183 Chronic kidney disease, stage 3 unspecified: Secondary | ICD-10-CM | POA: Diagnosis not present

## 2024-03-21 DIAGNOSIS — I2510 Atherosclerotic heart disease of native coronary artery without angina pectoris: Secondary | ICD-10-CM | POA: Diagnosis not present

## 2024-03-21 DIAGNOSIS — D509 Iron deficiency anemia, unspecified: Secondary | ICD-10-CM | POA: Diagnosis not present

## 2024-03-21 DIAGNOSIS — K633 Ulcer of intestine: Secondary | ICD-10-CM | POA: Insufficient documentation

## 2024-03-21 DIAGNOSIS — Z6832 Body mass index (BMI) 32.0-32.9, adult: Secondary | ICD-10-CM | POA: Diagnosis not present

## 2024-03-21 HISTORY — PX: COLONOSCOPY: SHX5424

## 2024-03-21 HISTORY — PX: ESOPHAGOGASTRODUODENOSCOPY: SHX5428

## 2024-03-21 LAB — GLUCOSE, CAPILLARY: Glucose-Capillary: 102 mg/dL — ABNORMAL HIGH (ref 70–99)

## 2024-03-21 SURGERY — COLONOSCOPY
Anesthesia: General

## 2024-03-21 MED ORDER — PROPOFOL 500 MG/50ML IV EMUL
INTRAVENOUS | Status: DC | PRN
Start: 2024-03-21 — End: 2024-03-21
  Administered 2024-03-21: 150 ug/kg/min via INTRAVENOUS

## 2024-03-21 MED ORDER — SODIUM CHLORIDE 0.9 % IV SOLN: INTRAVENOUS | Status: DC

## 2024-03-21 MED ORDER — SPOT INK MARKER SYRINGE KIT
PACK | SUBMUCOSAL | Status: DC | PRN
  Administered 2024-03-21: 3 mL via SUBMUCOSAL

## 2024-03-21 MED ORDER — LIDOCAINE HCL (PF) 2 % IJ SOLN
INTRAMUSCULAR | Status: AC
  Filled 2024-03-21: qty 5

## 2024-03-21 MED ORDER — LIDOCAINE HCL (CARDIAC) PF 100 MG/5ML IV SOSY
PREFILLED_SYRINGE | INTRAVENOUS | Status: DC | PRN
  Administered 2024-03-21: 100 mg via INTRAVENOUS

## 2024-03-21 MED ORDER — PROPOFOL 10 MG/ML IV BOLUS
INTRAVENOUS | Status: DC | PRN
  Administered 2024-03-21: 100 mg via INTRAVENOUS

## 2024-03-21 MED ORDER — PROPOFOL 1000 MG/100ML IV EMUL
INTRAVENOUS | Status: AC
  Filled 2024-03-21: qty 100

## 2024-03-21 NOTE — Anesthesia Postprocedure Evaluation (Signed)
 Anesthesia Post Note  Patient: Renee [name]  Procedure(s) Performed: COLONOSCOPY EGD (ESOPHAGOGASTRODUODENOSCOPY)  Patient location during evaluation: PACU Anesthesia Type: General Level of consciousness: awake Pain management: satisfactory to patient Vital Signs Assessment: post-procedure vital signs reviewed and stable Respiratory status: spontaneous breathing Cardiovascular status: stable Anesthetic complications: no   There were no known notable events for this encounter.   Last Vitals:  Vitals:   03/21/24 1140 03/21/24 1147  BP: 115/79   Pulse: 74 70  Resp: 19 17  Temp:    SpO2: 100% 100%    Last Pain:  Vitals:   03/21/24 1147  TempSrc:   PainSc: 0-No pain                 VAN STAVEREN,Danny Zimny

## 2024-03-21 NOTE — H&P (Signed)
 [name] , MD [address], [place], [place], [address] Phone: [phone] Fax: [phone]  Primary Care Physician:  [name], MD   Pre-Procedure History & Physical: HPI:  Renee [name] is a 73 y.o. 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 Wonda & Dr. Lucas   Severe aortic stenosis     Past Surgical History:  Procedure Laterality Date   ABDOMINAL HYSTERECTOMY     BREAST EXCISIONAL BIOPSY Right 20+ yrs ago   benign   CARDIAC SURGERY     cabg   CARPAL TUNNEL RELEASE Bilateral    COLONOSCOPY N/A 02/24/2017   Procedure: COLONOSCOPY;  Surgeon: Toledo, [name], MD;  Location: [facility] END[facility];  Service: Endoscopy;  Laterality: N/A;   COLONOSCOPY N/A 07/28/2022   Procedure: COLONOSCOPY;  Surgeon: Toledo, [name], MD;  Location: [facility] ENDOSCOPY;  Service: Gastroenterology;  Laterality: N/A;   CORONARY ARTERY BYPASS GRAFT     1996   CORONARY/GRAFT ANGIOGRAPHY N/A 09/15/2022   Procedure: CORONARY/GRAFT ANGIOGRAPHY;  Surgeon: [name], MD;  Location: [facility] INVASIVE CV LAB;  Service: Cardiovascular;  Laterality: N/A;   ENDARTERECTOMY Right 05/14/2023   Procedure: ENDARTERECTOMY CAROTID;  Surgeon: [name], MD;  Location: [facility] ORS;  Service: Vascular;  Laterality: Right;   ESOPHAGOGASTRODUODENOSCOPY (EGD) WITH PROPOFOL  N/A 07/27/2022   Procedure: ESOPHAGOGASTRODUODENOSCOPY (EGD) WITH PROPOFOL ;  Surgeon: Toledo, [name], MD;  Location: [facility] ENDOSCOPY;  Service: Gastroenterology;  Laterality: N/A;   HERNIA REPAIR     umbilical   INTRAOPERATIVE TRANSTHORACIC ECHOCARDIOGRAM N/A  10/13/2022   Procedure: INTRAOPERATIVE TRANSTHORACIC ECHOCARDIOGRAM;  Surgeon: [name], MD;  Location: [facility] INVASIVE CV LAB;  Service: Open Heart Surgery;  Laterality: N/A;   LUMBAR LAMINECTOMY/DECOMPRESSION MICRODISCECTOMY N/A 06/25/2021   Procedure: L2-3 DECOMPRESSION;  Surgeon: [name], MD;  Location: [facility] ORS;  Service: Neurosurgery;  Laterality: N/A;   TOTAL KNEE ARTHROPLASTY Left 10/25/2018   Procedure: TOTAL KNEE ARTHROPLASTY - LEFT - DIABETIC;  Surgeon: [name], MD;  Location: [facility] ORS;  Service: Orthopedics;  Laterality: Left;   TRANSCATHETER AORTIC VALVE REPLACEMENT, TRANSFEMORAL Right 10/13/2022   Procedure: Transcatheter Aortic Valve Replacement, Transfemoral;  Surgeon: [name], MD;  Location: [facility] INVASIVE CV LAB;  Service: Open Heart Surgery;  Laterality: Right;    Prior to Admission medications   Medication Sig Start Date End Date Taking? Authorizing Provider  amLODipine  (NORVASC ) 5 MG tablet Take 5 mg by mouth daily.   Yes [provider]  carvedilol  (COREG ) 6.25 MG tablet Take 6.25 mg by mouth 2 (two) times a day.    Yes [provider]  gabapentin  (NEURONTIN ) 800 MG tablet Take 800-1,600 mg by mouth See admin instructions. Take 1 tablet (800 mg) by mouth in the morning, 1 tablet (800 mg) by mouth at noon, & take 2 tablets (1600 mg) by mouth at bedtime.   Yes [provider]  rosuvastatin  (CRESTOR ) 20 MG tablet Take 20 mg by mouth at bedtime.   Yes [provider]  ACCU-CHEK GUIDE test strip USE TO TEST  SUGAR DAILY 08/05/22   [provider]  Accu-Chek Softclix Lancets lancets daily. 08/05/22   [provider]  acetaminophen  (TYLENOL ) 500 MG tablet Take 500 mg by mouth every 6 (six) hours as needed for headache or fever.    [provider]  Alpha-Lipoic Acid 600 MG CAPS Take 600 mg by mouth daily.    [provider]  amoxicillin  (AMOXIL ) 500 MG tablet Take 4 tablets (2,000 mg total) by  mouth as directed. 1 hour prior to dental work including cleanings 10/23/22   [name], PA-C  aspirin  EC 81 MG tablet Take 81 mg by mouth daily. Swallow whole. Patient not taking: Reported on 03/21/2024    [provider]  Azelastine HCl 137 MCG/SPRAY SOLN Place 1 spray into the nose 2 (two) times daily as needed (allergies). 05/29/21   [provider]  Blood Glucose Monitoring Suppl (ACCU-CHEK GUIDE ME) w/Device KIT USE AS DIRECTED TO TEST SUGAR DAILY 08/05/22   [provider]  Cholecalciferol  (VITAMIN D3) 50 MCG (2000 UT) TABS Take 2,000 Units by mouth daily.    [provider]  CINNAMON  PO Take 1,000 mg by mouth daily.    [provider]  clopidogrel  (PLAVIX ) 75 MG tablet Take 1 tablet (75 mg total) by mouth daily at 6 (six) AM. 05/16/23   [name], MD  [facility] SENNA 8.6 MG tablet TAKE 1 TABLET (8.6 MG TOTAL) BY MOUTH DAILY AS NEEDED FOR MILD CONSTIPATION. 06/25/22   Gregory Edsel Ruth, PA  cyclobenzaprine  (FLEXERIL ) 5 MG tablet Take 1 tablet (5 mg total) by mouth 3 (three) times daily as needed for muscle spasms. 07/24/23   Arlander Charleston, MD  EPINEPHrine  0.3 mg/0.3 mL IJ SOAJ injection Inject 0.3 mg into the muscle as needed for anaphylaxis. 05/04/22   [provider]  furosemide  (LASIX ) 20 MG tablet Take 20 mg by mouth once a week. In the morning    [provider]  loratadine  (CLARITIN ) 10 MG tablet Take 10 mg by mouth daily as needed for allergies.    [provider]  naproxen  (NAPROSYN ) 500 MG tablet Take 1 tablet (500 mg total) by mouth 2 (two) times daily with a meal. 07/24/23   Arlander Charleston, MD  NEOMYCIN -POLYMYXIN-HYDROCORTISONE (CORTISPORIN) 1 % SOLN OTIC solution Place 3 drops into the right ear 4 (four) times daily. 05/17/23   [provider]  nitroGLYCERIN  (NITROSTAT ) 0.4 MG SL tablet Place 0.4 mg under the tongue every 5 (five) minutes as needed for chest pain.    [provider]   Omega-3 Fatty Acids (FISH OIL) 1000 MG CAPS Take 1,000 mg by mouth 3 (three) times daily.    [provider]  potassium chloride  SA (KLOR-CON  M) 20 MEQ tablet Take 20 mEq by mouth daily. 06/22/22   [provider]  Propylene Glycol (SYSTANE BALANCE) 0.6 % SOLN Place 1 drop into both eyes daily.    [provider]  triamcinolone  cream (KENALOG ) 0.5 % Apply 1 Application topically daily as needed (irritation).    [provider]    Allergies as of 03/10/2024 - Review Complete 12/27/2023  Allergen Reaction Noted   Tizanidine   10/08/2022   Venlafaxine Other (See Comments) 07/02/2020   Codeine Nausea And Vomiting 05/23/2016   Tramadol Nausea And Vomiting 05/23/2016    Family History  Problem Relation Age of Onset   Breast cancer Neg Hx     Social History   Socioeconomic History   Marital status: Widowed  Spouse name: Not on file   Number of children: Not on file   Years of education: Not on file   Highest education level: Not on file  Occupational History   Not on file  Tobacco Use   Smoking status: Former    Current packs/day: 0.25    Average packs/day: 0.3 packs/day for 35.0 years (8.8 ttl pk-yrs)    Types: Cigarettes   Smokeless tobacco: Never  Vaping Use   Vaping status: Never Used  Substance and Sexual Activity   Alcohol  use: Yes   Drug use: No   Sexual activity: Not on file  Other Topics Concern   Not on file  Social History Narrative   Son lives with her. Spouse died June 17, 2019   Social Drivers of Health   Financial Resource Strain: Low Risk  (11/21/2023)   Received from [facility]   Overall Financial Resource Strain (CARDIA)    Difficulty of Paying Living Expenses: Not hard at all  Food Insecurity: No Food Insecurity (11/21/2023)   Received from [facility]   Hunger Vital Sign    Within the past 12 months, you worried that your food would run out before you got the money to buy more.:  Never true    Within the past 12 months, the food you bought just didn't last and you didn't have money to get more.: Never true  Transportation Needs: No Transportation Needs (11/21/2023)   Received from [facility] - Transportation    In the past 12 months, has lack of transportation kept you from medical appointments or from getting medications?: No    Lack of Transportation (Non-Medical): No  Physical Activity: Not on file  Stress: Not on file  Social Connections: Not on file  Intimate Partner Violence: Not At Risk (05/13/2023)   Humiliation, Afraid, Rape, and Kick questionnaire    Fear of Current or Ex-Partner: No    Emotionally Abused: No    Physically Abused: No    Sexually Abused: No    Review of Systems: See HPI, otherwise negative ROS  Physical Exam: BP (!) 151/81   Pulse 81   Temp (!) 96.7 F (35.9 C) (Temporal)   Resp 20   Wt 83.9 kg   SpO2 100%   BMI 32.77 kg/m  General:   Alert,  pleasant and cooperative in NAD Head:  Normocephalic and atraumatic. Neck:  Supple; no masses or thyromegaly. Lungs:  Clear throughout to auscultation, normal respiratory effort.    Heart:  +S1, +S2, Regular rate and rhythm, No edema. Abdomen:  Soft, nontender and nondistended. Normal bowel sounds, without guarding, and without rebound.   Neurologic:  Alert and  oriented x4;  grossly normal neurologically.  Impression/Plan: Renee [name] is here for an endoscopy and colonoscopy  to be performed for  evaluation of iron  deficiency anemia    Risks, benefits, limitations, and alternatives regarding endoscopy have been reviewed with the patient.  Questions have been answered.  All parties agreeable.   [name], MD  03/21/2024, 10:16 AM

## 2024-03-21 NOTE — Op Note (Signed)
 [facility] Gastroenterology Patient Name: Renee [name] Procedure Date: 03/21/2024 10:49 AM MRN: [id number] Account #: [id number] Date of Birth: 02-11-1951 Admit Type: Outpatient Age: 73 Room: [facility] ENDO ROOM 2 Gender: Female Note Status: Finalized Instrument Name: Colon Scope [id number] Procedure:             Colonoscopy Indications:           Iron  deficiency anemia Providers:             [name] MD, MD Referring MD:          Teodoro K. Aundria MD, MD (Referring MD), Ophelia Sage,                         MD (Referring MD) Medicines:             Monitored Anesthesia Care Complications:         No immediate complications. Procedure:             Pre-Anesthesia Assessment:                        - Prior to the procedure, a History and Physical was                         performed, and patient medications, allergies and                         sensitivities were reviewed. The patient's tolerance                         of previous anesthesia was reviewed.                        - The risks and benefits of the procedure and the                         sedation options and risks were discussed with the                         patient. All questions were answered and informed                         consent was obtained.                        - ASA Grade Assessment: II - A patient with mild                         systemic disease.                        - After reviewing the risks and benefits, the patient                         was deemed in satisfactory condition to undergo the                         procedure.                        After obtaining informed consent, the colonoscope  was                         passed under direct vision. Throughout the procedure,                         the patient's blood pressure, pulse, and oxygen                         saturations were monitored continuously. The                         Colonoscope was introduced through the  anus with the                         intention of advancing to the cecum. The scope was                         advanced to the ascending colon before the procedure                         was aborted. Medications were given. The patient                         tolerated the procedure well. The quality of the bowel                         preparation was [Prep Quality]. The colonoscopy was                         technically difficult and complex due to significant                         looping. Successful completion of the procedure was                         aided by withdrawing the scope and replacing with the                         pediatric colonoscope. Findings:      The perianal and digital rectal examinations were normal.      A malignant-appearing, intrinsic severe stenosis measuring 7 mm (inner       diameter) was found in the distal ascending colon and was non-traversed.       Biopsies were taken with a cold forceps for histology. Area was tattooed       with an injection of Spot (carbon black).      The exam was otherwise without abnormality. 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 Biopsied.                         Tattooed.                        - The examination was otherwise normal. Recommendation:        - Discharge patient to home (with escort).                        -  Resume previous diet.                        - Continue present medications.                        - Await pathology results.                        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                        Suggest Ct abdomen/chest/pelvis, CEA levels and rfer                         to oncology after bx results Procedure Code(s):     --- Professional ---                        205 122 9242, 52, Colonoscopy, flexible; with biopsy, single                         or multiple                        45381, 52, Colonoscopy,  flexible; with directed                         submucosal injection(s), any substance Diagnosis Code(s):     --- Professional ---                        X43.300, Other intestinal obstruction unspecified as                         to partial versus complete obstruction                        D50.9, Iron  deficiency anemia, unspecified CPT copyright 2022 American Medical Association. All rights reserved. The codes documented in this report are preliminary and upon coder review may  be revised to meet current compliance requirements. [name], MD [name] MD, MD 03/21/2024 11:19:57 AM This report has been signed electronically. Number of Addenda: 0 Note Initiated On: 03/21/2024 10:49 AM Total Procedure Duration: 0 hours 16 minutes 35 seconds  Estimated Blood Loss:  Estimated blood loss: none.      [facility]

## 2024-03-21 NOTE — Op Note (Signed)
 [facility] Gastroenterology Patient Name: Renee [name] Procedure Date: 03/21/2024 10:50 AM MRN: [id number] Account #: [id number] Date of Birth: 1950-07-01 Admit Type: Outpatient Age: 73 Room: [facility] ENDO ROOM 2 Gender: Female Note Status: Finalized Instrument Name: Barnie GI Scope [id number] Procedure:             Upper GI endoscopy Indications:           Iron  deficiency anemia Providers:             [name] MD, MD Referring MD:          Teodoro K. Aundria MD, MD (Referring MD), Ophelia Sage,                         MD (Referring MD) Medicines:             Monitored Anesthesia Care Complications:         No immediate complications. Procedure:             Pre-Anesthesia Assessment:                        - Prior to the procedure, a History and Physical was                         performed, and patient medications, allergies and                         sensitivities were reviewed. The patient's tolerance                         of previous anesthesia was reviewed.                        - The risks and benefits of the procedure and the                         sedation options and risks were discussed with the                         patient. All questions were answered and informed                         consent was obtained.                        - ASA Grade Assessment: II - A patient with mild                         systemic disease.                        After obtaining informed consent, the endoscope was                         passed under direct vision. Throughout the procedure,                         the patient's blood pressure, pulse, and oxygen  saturations were monitored continuously. The Endoscope                         was introduced through the mouth, and advanced to the                         third part of duodenum. The upper GI endoscopy was                         accomplished with ease. The patient tolerated the                          procedure well. Findings:      The examined duodenum was normal.      The examined duodenum was normal. Biopsies were taken with a cold       forceps for histology.      The stomach was normal.      The esophagus was normal.      The cardia and gastric fundus were normal on retroflexion. Impression:            - Normal examined duodenum.                        - Normal examined duodenum. Biopsied.                        - Normal stomach.                        - Normal esophagus. Recommendation:        - Await pathology results.                        - Perform a colonoscopy today. Procedure Code(s):     --- Professional ---                        [phone], Esophagogastroduodenoscopy, flexible,                         transoral; with biopsy, single or multiple Diagnosis Code(s):     --- Professional ---                        D50.9, Iron  deficiency anemia, unspecified CPT copyright 2022 American Medical Association. All rights reserved. The codes documented in this report are preliminary and upon coder review may  be revised to meet current compliance requirements. [name], MD [name] MD, MD 03/21/2024 10:58:53 AM This report has been signed electronically. Number of Addenda: 0 Note Initiated On: 03/21/2024 10:50 AM Estimated Blood Loss:  Estimated blood loss: none.      [facility]

## 2024-03-21 NOTE — Transfer of Care (Signed)
 Immediate Anesthesia Transfer of Care Note  Patient: Renee [name]  Procedure(s) Performed: COLONOSCOPY EGD (ESOPHAGOGASTRODUODENOSCOPY)  Patient Location: PACU  Anesthesia Type:General  Level of Consciousness: awake and sedated  Airway & Oxygen Therapy: Patient Spontanous Breathing and Patient connected to nasal cannula oxygen  Post-op Assessment: Report given to RN and Post -op Vital signs reviewed and stable  Post vital signs: Reviewed and stable  Last Vitals:  Vitals Value Taken Time  BP 130/68 03/21/24 11:15  Temp    Pulse 79 03/21/24 11:17  Resp 11 03/21/24 11:17  SpO2 99 % 03/21/24 11:17    Last Pain:  Vitals:   03/21/24 1014  TempSrc: Temporal         Complications: There were no known notable events for this encounter.

## 2024-03-21 NOTE — Anesthesia Preprocedure Evaluation (Addendum)
 Anesthesia Evaluation  Patient identified by MRN, date of birth, ID band Patient awake    Reviewed: Allergy & Precautions, NPO status , Patient's Chart, lab work & pertinent test results  Airway Mallampati: II  TM Distance: >3 FB Neck ROM: full    Dental  (+) Upper Dentures, Lower Dentures   Pulmonary COPD, Patient abstained from smoking., former smoker   Pulmonary exam normal  + decreased breath sounds      Cardiovascular Exercise Tolerance: Good hypertension, Pt. on medications + CAD  Normal cardiovascular exam Rhythm:Regular Rate:Normal  S/P Aortic valve replacement   Neuro/Psych TIAnegative neurological ROS  negative psych ROS   GI/Hepatic negative GI ROS, Neg liver ROS,GERD  Medicated,,  Endo/Other  diabetes, Type 2, Oral Hypoglycemic Agents  Class 3 obesity  Renal/GU CRFRenal disease  negative genitourinary   Musculoskeletal   Abdominal  (+) + obese  Peds negative pediatric ROS (+)  Hematology negative hematology ROS (+)   Anesthesia Other Findings Past Medical History: No date: Arthritis No date: Chronic kidney disease     Comment:  stage 3 No date: Coronary artery disease No date: Diabetes mellitus without complication (HCC) No date: GERD (gastroesophageal reflux disease)     Comment:  no meds No date: Hypertension 10/13/2022: S/P TAVR (transcatheter aortic valve replacement)     Comment:  s/p TAVR with a 23 mm Edwards S3UR via the TF approach               by Dr. Wonda & Dr. Lucas No date: Severe aortic stenosis  Past Surgical History: No date: ABDOMINAL HYSTERECTOMY 20+ yrs ago: BREAST EXCISIONAL BIOPSY; Right     Comment:  benign No date: CARDIAC SURGERY     Comment:  cabg No date: CARPAL TUNNEL RELEASE; Bilateral 02/24/2017: COLONOSCOPY; N/A     Comment:  Procedure: COLONOSCOPY;  Surgeon: Toledo, [name], MD;              Location: [facility] ENDOSCOPY;  Service: Endoscopy;                 Laterality: N/A; 07/28/2022: COLONOSCOPY; N/A     Comment:  Procedure: COLONOSCOPY;  Surgeon: Toledo, [name], MD;              Location: [facility] ENDOSCOPY;  Service: Gastroenterology;                Laterality: N/A; No date: CORONARY ARTERY BYPASS GRAFT     Comment:  1996 09/15/2022: CORONARY/GRAFT ANGIOGRAPHY; N/A     Comment:  Procedure: CORONARY/GRAFT ANGIOGRAPHY;  Surgeon: [name], MD;  Location: [facility] INVASIVE CV LAB;  Service:               Cardiovascular;  Laterality: N/A; 05/14/2023: ENDARTERECTOMY; Right     Comment:  Procedure: ENDARTERECTOMY CAROTID;  Surgeon: [name], MD;  Location: [facility] ORS;  Service: Vascular;                Laterality: Right; 07/27/2022: ESOPHAGOGASTRODUODENOSCOPY (EGD) WITH PROPOFOL ; N/A     Comment:  Procedure: ESOPHAGOGASTRODUODENOSCOPY (EGD) WITH               PROPOFOL ;  Surgeon: Toledo, [name], MD;  Location:               [facility] ENDOSCOPY;  Service: Gastroenterology;  Laterality:  N/A; No date: HERNIA REPAIR     Comment:  umbilical 10/13/2022: INTRAOPERATIVE TRANSTHORACIC ECHOCARDIOGRAM; N/A     Comment:  Procedure: INTRAOPERATIVE TRANSTHORACIC ECHOCARDIOGRAM;               Surgeon: [name], MD;  Location: [facility] INVASIVE CV               LAB;  Service: Open Heart Surgery;  Laterality: N/A; 06/25/2021: LUMBAR LAMINECTOMY/DECOMPRESSION MICRODISCECTOMY; N/A     Comment:  Procedure: L2-3 DECOMPRESSION;  Surgeon: [name], MD;  Location: [facility] ORS;  Service: Neurosurgery;              Laterality: N/A; 10/25/2018: TOTAL KNEE ARTHROPLASTY; Left     Comment:  Procedure: TOTAL KNEE ARTHROPLASTY - LEFT - DIABETIC;                Surgeon: [name], MD;  Location: [facility] ORS;                Service: Orthopedics;  Laterality: Left; 10/13/2022: TRANSCATHETER AORTIC VALVE REPLACEMENT, TRANSFEMORAL; Right     Comment:  Procedure: Transcatheter Aortic Valve Replacement,                Transfemoral;  Surgeon: [name], MD;  Location:               [facility] INVASIVE CV LAB;  Service: Open Heart Surgery;                Laterality: Right;     Reproductive/Obstetrics negative OB ROS                              Anesthesia Physical Anesthesia Plan  ASA: 3  Anesthesia Plan: General   Post-op Pain Management:    Induction: Intravenous  PONV Risk Score and Plan: Propofol  infusion and TIVA  Airway Management Planned: Natural Airway and Nasal Cannula  Additional Equipment:   Intra-op Plan:   Post-operative Plan:   Informed Consent: I have reviewed the patients History and Physical, chart, labs and discussed the procedure including the risks, benefits and alternatives for the proposed anesthesia with the patient or authorized representative who has indicated his/her understanding and acceptance.     Dental Advisory Given  Plan Discussed with: CRNA  Anesthesia Plan Comments:          Anesthesia Quick Evaluation

## 2024-03-24 LAB — SURGICAL PATHOLOGY

## 2024-04-04 ENCOUNTER — Other Ambulatory Visit: Payer: Self-pay | Admitting: Gastroenterology

## 2024-04-04 DIAGNOSIS — R933 Abnormal findings on diagnostic imaging of other parts of digestive tract: Secondary | ICD-10-CM

## 2024-04-04 DIAGNOSIS — D509 Iron deficiency anemia, unspecified: Secondary | ICD-10-CM

## 2024-04-20 ENCOUNTER — Ambulatory Visit
Admission: RE | Admit: 2024-04-20 | Discharge: 2024-04-20 | Disposition: A | Discharge status: Home / Self Care | Source: Ambulatory Visit | Attending: Gastroenterology | Admitting: Gastroenterology

## 2024-04-20 DIAGNOSIS — R933 Abnormal findings on diagnostic imaging of other parts of digestive tract: Secondary | ICD-10-CM | POA: Diagnosis present

## 2024-04-20 DIAGNOSIS — D509 Iron deficiency anemia, unspecified: Secondary | ICD-10-CM | POA: Diagnosis present

## 2024-04-20 LAB — POCT I-STAT CREATININE: Creatinine, Ser: 1.4 mg/dL — ABNORMAL HIGH (ref 0.44–1.00)

## 2024-04-20 MED ORDER — IOHEXOL 300 MG/ML  SOLN
100.0000 mL | Freq: Once | INTRAMUSCULAR | Status: AC | PRN
  Administered 2024-04-20: 100 mL via INTRAVENOUS

## 2024-04-25 ENCOUNTER — Ambulatory Visit: Admitting: Student in an Organized Health Care Education/Training Program

## 2024-04-26 ENCOUNTER — Ambulatory Visit: Payer: Self-pay | Admitting: Surgery

## 2024-04-26 DIAGNOSIS — K6389 Other specified diseases of intestine: Secondary | ICD-10-CM

## 2024-04-26 NOTE — H&P (View-Only) (Signed)
 Subjective:   CC: Colonic mass [K63.89]  HPI: referred by [name], MD for evaluation of above.  History of Present Illness Noted on last colonoscopy.  Asymptomatic otherwise.     Past Medical History:  has a past medical history of Angina pectoris, Arthritis, CAD (coronary artery disease), Diabetes mellitus type 2, uncomplicated (CMS/HHS-HCC), DJD (degenerative joint disease), Heart murmur, Hypercholesterolemia, Hypertension, Neuropathy, Smoking, and Syncope.  Past Surgical History:  has a past surgical history that includes Coronary artery bypass graft (1996); CABG with PCI and stent; Hysterectomy; Right breast biopsy; Hernia repair; Carpal tunnel surgery (Bilateral); Colonoscopy (02/24/2017); Total knee arthroplasty-diabetic (Left, 10/25/2018); L2-3 decompression (06/25/2021); Colon @ [facility] (07/27/2022); EGD @ [facility] (07/27/2022); and Carotid endarterectomy (Right, 05/2023).  Family History: family history includes Arthritis in her father, mother, sister, and sister; Colon polyps in her sister and sister.  Social History:  reports that she has quit smoking. Her smoking use included cigarettes. She has never used smokeless tobacco. She reports that she does not currently use alcohol . She reports that she does not use drugs.  Current Medications: has a current medication list which includes the following prescription(s): accu-chek guide test strips, alpha lipoic acid, amlodipine , aspirin , glucose blood, onetouch verio reflect meter, carvedilol , cholecalciferol , cinnamon  bark, clopidogrel , epinephrine , furosemide , gabapentin , accu-chek softclix lancets, lancets, loratadine , magnesium  hydroxide, nitroglycerin , omega 3-dha-epa-fish oil, pantoprazole , potassium chloride , systane balance, rosuvastatin , sodium, potassium, and magnesium , and triamcinolone .  Allergies:  Allergies  Allergen Reactions   Codeine Nausea And Vomiting   Codeine Phosphate Vomiting   Tramadol Nausea and Nausea And Vomiting    Tizanidine  Headache    headaches   Venlafaxine Analogues Other (See Comments)    intolerant    ROS:  A 15 point review of systems was performed and pertinent positives and negatives noted in HPI   Objective:     BP 124/71   Pulse 78   Ht 160 cm (5' 3)   Wt 83.9 kg (185 lb)   LMP  (LMP Unknown)   BMI 32.77 kg/m   Constitutional :  No distress, cooperative, alert  Lymphatics/Throat:  Supple with no lymphadenopathy  Respiratory:  Clear to auscultation bilaterally  Cardiovascular:  Regular rate and rhythm  Gastrointestinal: Soft, non-tender, non-distended, no organomegaly.  Musculoskeletal: Steady gait and movement  Skin: Cool and moist  Psychiatric: Normal affect, non-agitated, not confused       LABS:  SURGICAL PATHOLOGY SURGICAL PATHOLOGY [facility] [address], [address] [place], [place] [address] Telephone [phone] or [phone] Fax [phone]  REPORT OF SURGICAL PATHOLOGY   Accession #: [id number] Patient Name: Renee [name], Renee [name] Visit # : [id number]  MRN: [id number] Physician: Kung Ruel DOB/Age [birth year] (Age: 73) Gender: F Collected Date: 03/21/2024 Received Date: 03/22/2024  FINAL DIAGNOSIS       1. Duodenum, Biopsy, cbx :      - SMALL BOWEL MUCOSA WITH NO SPECIFIC PATHOLOGIC CHANGE.      - NEGATIVE FOR INTRAEPITHELIAL LYMPHOCYTOSIS OR VILLOUS BLUNTING.       2. Ascending Colon Biopsy, cbx malignant appearing stricture :      - COLONIC MUCOSA WITH ULCERATION, GRANULATION TISSUE, AND CHRONIC INFLAMMATORY      CHANGES.      - NEGATIVE FOR MALIGNANCY WITH DEEPER LEVELS EXAMINED (SEE COMMENT).       Diagnosis Note : The specimen may not be representative of an underlying      lesion.Clinical and endoscopic correlation is recommended.      ELECTRONIC SIGNATURE :  Coronel Md, Misti, Sports Administrator, International Aid/development Worker  MICROSCOPIC DESCRIPTION  CASE COMMENTS STAINS USED IN DIAGNOSIS: H&E H&E *RECUT DEEPER X 3  LEVELS    CLINICAL HISTORY  SPECIMEN(S) OBTAINED 1. Duodenum, Biopsy, Cbx 2. Ascending Colon Biopsy, Cbx Malignant Appearing Stricture  SPECIMEN COMMENTS: 1. rule out celiac SPECIMEN CLINICAL INFORMATION:    Gross Description 1. Received in formalin are tan, soft tissue fragments that are submitted in toto.Number:  7,   Size:  0.3 cm,  1 block. 2. Received in formalin are tan, soft tissue fragments that are submitted in toto.Number:  3,   Size:  0.4 cm,  1 block.mb 03-22-24        Report signed out from the following location(s) [place]. [facility] [address] [place], [place] [address] CLIA #: [id number]  [facility] [address] [place], [place] [address] CLIA #: [id number]    CEA-  RADS: EXAM: CT ABDOMEN AND PELVIS WITH CONTRAST 04/20/2024 08:56:44 AM   TECHNIQUE: CT of the abdomen and pelvis was performed with the administration of 100 mL of Omnipaque  300 intravenous contrast. Multiplanar reformatted images are provided for review. Automated exposure control, iterative reconstruction, and/or weight-based adjustment of the mA/kV was utilized to reduce the radiation dose to as low as reasonably achievable.   CONTRAST: 100 mL of Omnipaque  300.   COMPARISON: None available.   CLINICAL HISTORY: Abnormal colonoscopy.   FINDINGS:   LOWER CHEST: No acute abnormality.   LIVER: Mild hepatic steatosis. No intrahepatic mass. No intrahepatic biliary ductal dilation.   GALLBLADDER AND BILE DUCTS: Gallbladder is unremarkable. No biliary ductal dilatation.   SPLEEN: No acute abnormality.   PANCREAS: No acute abnormality.   ADRENAL GLANDS: No acute abnormality.   KIDNEYS, URETERS AND BLADDER: Small bilateral renal cortical atrophy. 1 - 2 mm punctate nonobstructing calculus within the lower pole of the right kidney. No ureteral calculi. No hydronephrosis. No perinephric inflammatory stranding or fluid collections  were identified. The bladder is unremarkable.   GI AND BOWEL: Stomach demonstrates no acute abnormality. Moderate sigmoid diverticulosis without superimposed acute inflammatory change. There is an area of irregular, circumferential mural thickening with proximal shoulder and best seen in the proximal transverse colon at image 37 / 2 and 19 / 5 which is not well characterized on this examination, however, an annular mass is not excluded. Correlation with direct visualization is recommended for further evaluation. No evidence of obstruction or perforation. The appendix is absent. No free intraperitoneal gas or fluid. The small bowel and large bowel are otherwise unremarkable.   PERITONEUM AND RETROPERITONEUM: No ascites. No free air.   VASCULATURE: Aorta is normal in caliber. Extensive aortoiliac atherosclerotic calcification. Suspected hemodynamically significant stenosis of the superior and inferior mesenteric artery and bilateral renal artery origins. No aortic aneurysm. Peripheral vascular disease. If there is clinical evidence of hemodynamically significant renal artery stenosis, chronic mesenteric ischemia, or lower extremity claudication, CT arteriography may be more helpful for further evaluation.   LYMPH NODES: No lymphadenopathy.   REPRODUCTIVE ORGANS: Uterus absent. No adnexal masses.   BONES AND SOFT TISSUES: Osseous structures are age appropriate. No acute bone abnormality. No lytic or blastic bone lesion. No focal soft tissue abnormality.   IMPRESSION: 1. Irregular, circumferential mural thickening with proximal shoulder in the proximal transverse colon, not well characterized on this examination. Annular mass is not excluded. Correlation with direct visualization is recommended for further evaluation. 2. Moderate sigmoid diverticulosis without superimposed acute inflammatory change. 3. Peripheral vascular disease.  If there is clinical evidence  of hemodynamically significant renal artery stenosis, chronic mesenteric ischemia, or lower extremity claudication, CT arteriography may be more helpful for further evaluation. 4. Mild hepatic steatosis. 5. Mild bilateral renal cortical atrophy with a 12 mm nonobstructing right lower pole renal calculus. No hydronephrosis.   Electronically signed by: Dorethia Molt MD 04/22/2024 12:52 AM EST RP Workstation: [id number]     Assessment:      Colonic mass [K63.89]- biopsy results benign but visualization of mass noted on colonoscopy and CT imaging concerning enough for malignancy, recommend formal excision  Plan:     The risk of laparoscopic colon resection surgery includes, but not limited to, recurrence, bleeding, chronic pain, post-op infxn, post-op SBO or ileus, hernias, resection of bowel, re-anastamosis, possible ostomy placement and need for re-operation to address said risks. The risks of general anesthetic, if used, includes MI, CVA, sudden death or even reaction to anesthetic medications also discussed. Alternatives include continued observation.  Benefits include possible symptom relief, preventing further decline in health and possible death.   Typical post-op recovery time of additional days in hospital for observation afterwards also discussed.   Prep ordered.  Will proceed with ERAS protocol as well.  Pending medical clearance and workup as noted above.    CEA day of surgery.  Robotic assisted right extended hemicolectomy.  Hold plavix  3 days prior to if safe to do so per cards.  Risk stratification from cards and pcp   The patient verbalized understanding and all questions were answered to the patient's satisfaction.  labs/images/medications/previous chart entries reviewed personally and relevant changes/updates noted above.

## 2024-04-26 NOTE — H&P (Signed)
 Subjective:   CC: Colonic mass [K63.89]  HPI: referred by [name], MD for evaluation of above.  History of Present Illness Noted on last colonoscopy.  Asymptomatic otherwise.     Past Medical History:  has a past medical history of Angina pectoris, Arthritis, CAD (coronary artery disease), Diabetes mellitus type 2, uncomplicated (CMS/HHS-HCC), DJD (degenerative joint disease), Heart murmur, Hypercholesterolemia, Hypertension, Neuropathy, Smoking, and Syncope.  Past Surgical History:  has a past surgical history that includes Coronary artery bypass graft (1996); CABG with PCI and stent; Hysterectomy; Right breast biopsy; Hernia repair; Carpal tunnel surgery (Bilateral); Colonoscopy (02/24/2017); Total knee arthroplasty-diabetic (Left, 10/25/2018); L2-3 decompression (06/25/2021); Colon @ [facility] (07/27/2022); EGD @ [facility] (07/27/2022); and Carotid endarterectomy (Right, 05/2023).  Family History: family history includes Arthritis in her father, mother, sister, and sister; Colon polyps in her sister and sister.  Social History:  reports that she has quit smoking. Her smoking use included cigarettes. She has never used smokeless tobacco. She reports that she does not currently use alcohol . She reports that she does not use drugs.  Current Medications: has a current medication list which includes the following prescription(s): accu-chek guide test strips, alpha lipoic acid, amlodipine , aspirin , glucose blood, onetouch verio reflect meter, carvedilol , cholecalciferol , cinnamon  bark, clopidogrel , epinephrine , furosemide , gabapentin , accu-chek softclix lancets, lancets, loratadine , magnesium  hydroxide, nitroglycerin , omega 3-dha-epa-fish oil, pantoprazole , potassium chloride , systane balance, rosuvastatin , sodium, potassium, and magnesium , and triamcinolone .  Allergies:  Allergies  Allergen Reactions   Codeine Nausea And Vomiting   Codeine Phosphate Vomiting   Tramadol Nausea and Nausea And Vomiting    Tizanidine  Headache    headaches   Venlafaxine Analogues Other (See Comments)    intolerant    ROS:  A 15 point review of systems was performed and pertinent positives and negatives noted in HPI   Objective:     BP 124/71   Pulse 78   Ht 160 cm (5' 3)   Wt 83.9 kg (185 lb)   LMP  (LMP Unknown)   BMI 32.77 kg/m   Constitutional :  No distress, cooperative, alert  Lymphatics/Throat:  Supple with no lymphadenopathy  Respiratory:  Clear to auscultation bilaterally  Cardiovascular:  Regular rate and rhythm  Gastrointestinal: Soft, non-tender, non-distended, no organomegaly.  Musculoskeletal: Steady gait and movement  Skin: Cool and moist  Psychiatric: Normal affect, non-agitated, not confused       LABS:  SURGICAL PATHOLOGY SURGICAL PATHOLOGY [facility] [address], [address] [place], [place] [address] Telephone [phone] or [phone] Fax [phone]  REPORT OF SURGICAL PATHOLOGY   Accession #: [id number] Patient Name: Renee [name], Renee [name] Visit # : [id number]  MRN: [id number] Physician: Kung Ruel DOB/Age [birth year] (Age: 73) Gender: F Collected Date: 03/21/2024 Received Date: 03/22/2024  FINAL DIAGNOSIS       1. Duodenum, Biopsy, cbx :      - SMALL BOWEL MUCOSA WITH NO SPECIFIC PATHOLOGIC CHANGE.      - NEGATIVE FOR INTRAEPITHELIAL LYMPHOCYTOSIS OR VILLOUS BLUNTING.       2. Ascending Colon Biopsy, cbx malignant appearing stricture :      - COLONIC MUCOSA WITH ULCERATION, GRANULATION TISSUE, AND CHRONIC INFLAMMATORY      CHANGES.      - NEGATIVE FOR MALIGNANCY WITH DEEPER LEVELS EXAMINED (SEE COMMENT).       Diagnosis Note : The specimen may not be representative of an underlying      lesion.Clinical and endoscopic correlation is recommended.      ELECTRONIC SIGNATURE :  Coronel Md, Misti, Sports Administrator, International Aid/development Worker  MICROSCOPIC DESCRIPTION  CASE COMMENTS STAINS USED IN DIAGNOSIS: H&E H&E *RECUT DEEPER X 3  LEVELS    CLINICAL HISTORY  SPECIMEN(S) OBTAINED 1. Duodenum, Biopsy, Cbx 2. Ascending Colon Biopsy, Cbx Malignant Appearing Stricture  SPECIMEN COMMENTS: 1. rule out celiac SPECIMEN CLINICAL INFORMATION:    Gross Description 1. Received in formalin are tan, soft tissue fragments that are submitted in toto.Number:  7,   Size:  0.3 cm,  1 block. 2. Received in formalin are tan, soft tissue fragments that are submitted in toto.Number:  3,   Size:  0.4 cm,  1 block.mb 03-22-24        Report signed out from the following location(s) [place]. [facility] [address] [place], [place] [address] CLIA #: [id number]  [facility] [address] [place], [place] [address] CLIA #: [id number]    CEA-  RADS: EXAM: CT ABDOMEN AND PELVIS WITH CONTRAST 04/20/2024 08:56:44 AM   TECHNIQUE: CT of the abdomen and pelvis was performed with the administration of 100 mL of Omnipaque  300 intravenous contrast. Multiplanar reformatted images are provided for review. Automated exposure control, iterative reconstruction, and/or weight-based adjustment of the mA/kV was utilized to reduce the radiation dose to as low as reasonably achievable.   CONTRAST: 100 mL of Omnipaque  300.   COMPARISON: None available.   CLINICAL HISTORY: Abnormal colonoscopy.   FINDINGS:   LOWER CHEST: No acute abnormality.   LIVER: Mild hepatic steatosis. No intrahepatic mass. No intrahepatic biliary ductal dilation.   GALLBLADDER AND BILE DUCTS: Gallbladder is unremarkable. No biliary ductal dilatation.   SPLEEN: No acute abnormality.   PANCREAS: No acute abnormality.   ADRENAL GLANDS: No acute abnormality.   KIDNEYS, URETERS AND BLADDER: Small bilateral renal cortical atrophy. 1 - 2 mm punctate nonobstructing calculus within the lower pole of the right kidney. No ureteral calculi. No hydronephrosis. No perinephric inflammatory stranding or fluid collections  were identified. The bladder is unremarkable.   GI AND BOWEL: Stomach demonstrates no acute abnormality. Moderate sigmoid diverticulosis without superimposed acute inflammatory change. There is an area of irregular, circumferential mural thickening with proximal shoulder and best seen in the proximal transverse colon at image 37 / 2 and 19 / 5 which is not well characterized on this examination, however, an annular mass is not excluded. Correlation with direct visualization is recommended for further evaluation. No evidence of obstruction or perforation. The appendix is absent. No free intraperitoneal gas or fluid. The small bowel and large bowel are otherwise unremarkable.   PERITONEUM AND RETROPERITONEUM: No ascites. No free air.   VASCULATURE: Aorta is normal in caliber. Extensive aortoiliac atherosclerotic calcification. Suspected hemodynamically significant stenosis of the superior and inferior mesenteric artery and bilateral renal artery origins. No aortic aneurysm. Peripheral vascular disease. If there is clinical evidence of hemodynamically significant renal artery stenosis, chronic mesenteric ischemia, or lower extremity claudication, CT arteriography may be more helpful for further evaluation.   LYMPH NODES: No lymphadenopathy.   REPRODUCTIVE ORGANS: Uterus absent. No adnexal masses.   BONES AND SOFT TISSUES: Osseous structures are age appropriate. No acute bone abnormality. No lytic or blastic bone lesion. No focal soft tissue abnormality.   IMPRESSION: 1. Irregular, circumferential mural thickening with proximal shoulder in the proximal transverse colon, not well characterized on this examination. Annular mass is not excluded. Correlation with direct visualization is recommended for further evaluation. 2. Moderate sigmoid diverticulosis without superimposed acute inflammatory change. 3. Peripheral vascular disease.  If there is clinical evidence  of hemodynamically significant renal artery stenosis, chronic mesenteric ischemia, or lower extremity claudication, CT arteriography may be more helpful for further evaluation. 4. Mild hepatic steatosis. 5. Mild bilateral renal cortical atrophy with a 12 mm nonobstructing right lower pole renal calculus. No hydronephrosis.   Electronically signed by: Dorethia Molt MD 04/22/2024 12:52 AM EST RP Workstation: [id number]     Assessment:      Colonic mass [K63.89]- biopsy results benign but visualization of mass noted on colonoscopy and CT imaging concerning enough for malignancy, recommend formal excision  Plan:     The risk of laparoscopic colon resection surgery includes, but not limited to, recurrence, bleeding, chronic pain, post-op infxn, post-op SBO or ileus, hernias, resection of bowel, re-anastamosis, possible ostomy placement and need for re-operation to address said risks. The risks of general anesthetic, if used, includes MI, CVA, sudden death or even reaction to anesthetic medications also discussed. Alternatives include continued observation.  Benefits include possible symptom relief, preventing further decline in health and possible death.   Typical post-op recovery time of additional days in hospital for observation afterwards also discussed.   Prep ordered.  Will proceed with ERAS protocol as well.  Pending medical clearance and workup as noted above.    CEA day of surgery.  Robotic assisted right extended hemicolectomy.  Hold plavix  3 days prior to if safe to do so per cards.  Risk stratification from cards and pcp   The patient verbalized understanding and all questions were answered to the patient's satisfaction.  labs/images/medications/previous chart entries reviewed personally and relevant changes/updates noted above.

## 2024-05-03 ENCOUNTER — Encounter
Admission: RE | Admit: 2024-05-03 | Discharge: 2024-05-03 | Disposition: A | Discharge status: Home / Self Care | Source: Ambulatory Visit | Attending: Surgery | Admitting: Surgery

## 2024-05-03 VITALS — Wt 185.0 lb

## 2024-05-03 DIAGNOSIS — Z79899 Other long term (current) drug therapy: Secondary | ICD-10-CM

## 2024-05-03 DIAGNOSIS — E119 Type 2 diabetes mellitus without complications: Secondary | ICD-10-CM

## 2024-05-03 DIAGNOSIS — I350 Nonrheumatic aortic (valve) stenosis: Secondary | ICD-10-CM

## 2024-05-03 DIAGNOSIS — E1142 Type 2 diabetes mellitus with diabetic polyneuropathy: Secondary | ICD-10-CM

## 2024-05-03 DIAGNOSIS — K6389 Other specified diseases of intestine: Secondary | ICD-10-CM

## 2024-05-03 DIAGNOSIS — N1831 Chronic kidney disease, stage 3a: Secondary | ICD-10-CM

## 2024-05-03 DIAGNOSIS — Z01812 Encounter for preprocedural laboratory examination: Secondary | ICD-10-CM

## 2024-05-03 DIAGNOSIS — Z01818 Encounter for other preprocedural examination: Secondary | ICD-10-CM

## 2024-05-03 HISTORY — DX: Other specified diseases of intestine: K63.89

## 2024-05-03 HISTORY — DX: Polyneuropathy, unspecified: G62.9

## 2024-05-03 HISTORY — DX: Type 2 diabetes mellitus without complications: E11.9

## 2024-05-03 HISTORY — DX: Presence of aortocoronary bypass graft: Z95.1

## 2024-05-03 HISTORY — DX: Chronic kidney disease, stage 3 unspecified: N18.30

## 2024-05-03 HISTORY — DX: Diverticulitis of intestine, part unspecified, without perforation or abscess without bleeding: K57.92

## 2024-05-03 HISTORY — DX: Anemia, unspecified: D64.9

## 2024-05-03 HISTORY — DX: Hyperlipidemia, unspecified: E78.5

## 2024-05-03 HISTORY — DX: Fatty (change of) liver, not elsewhere classified: K76.0

## 2024-05-03 NOTE — Progress Notes (Signed)
 PT does not have my chart and she can't get into her email. Went over instructions with pt on the phone and she wrote all surgery instructions down

## 2024-05-03 NOTE — Patient Instructions (Addendum)
 Your procedure is scheduled on:05-08-24 Monday Report to the Registration Desk on the 1st floor of the [facility].Then proceed to the 2nd floor Surgery Desk To find out your arrival time, please call [phone] between 1PM - 3PM on:05-05-24 Friday If your arrival time is 6:00 am, do not arrive before that time as the [facility] Mall entrance doors do not open until 6:00 am.  REMEMBER: Instructions that are not followed completely may result in serious medical risk, up to and including death; or upon the discretion of your surgeon and anesthesiologist your surgery may need to be rescheduled.  Do not eat food after midnight the night before surgery.  No gum chewing or hard candies.  You may however, drink Water up to 2 hours before you are scheduled to arrive for your surgery. Do not drink anything within 2 hours of your scheduled arrival time..  One week prior to surgery:Stop NOW (05-03-24) Stop Anti-inflammatories (NSAIDS) such as Advil, Aleve , Ibuprofen, Motrin, Naproxen , Naprosyn  and Aspirin  based products such as Excedrin, Goody's Powder, BC Powder. Stop ANY OVER THE COUNTER supplements until after surgery.  You may however, continue to take Tylenol  if needed for pain up until the day of surgery.  You were instructed by Dr Estelita office on when to stop your Aspirin  and clopidogrel  (PLAVIX ) -Follow those instructions   Continue taking all of your other prescription medications up until the day of surgery.  ON THE DAY OF SURGERY ONLY TAKE THESE MEDICATIONS WITH SIPS OF WATER: -amLODipine  (NORVASC )  -carvedilol  (COREG )  -gabapentin  (NEURONTIN )  -pantoprazole  (PROTONIX )  -potassium chloride  SA (KLOR-CON  M)   Follow Bowel Prep as instructed by Dr Estelita office  No Alcohol  for 24 hours before or after surgery.  No Smoking including e-cigarettes for 24 hours before surgery.  No chewable tobacco products for at least 6 hours before surgery.  No nicotine  patches on the day of  surgery.  Do not use any recreational drugs for at least a week (preferably 2 weeks) before your surgery.  Please be advised that the combination of cocaine and anesthesia may have negative outcomes, up to and including death. If you test positive for cocaine, your surgery will be cancelled.  On the morning of surgery brush your teeth with toothpaste and water, you may rinse your mouth with mouthwash if you wish. Do not swallow any toothpaste or mouthwash.  Do not wear jewelry, make-up, hairpins, clips or nail polish.  For welded (permanent) jewelry: bracelets, anklets, waist bands, etc.  Please have this removed prior to surgery.  If it is not removed, there is a chance that hospital personnel will need to cut it off on the day of surgery.  Do not wear lotions, powders, or perfumes.   Do not shave body hair from the neck down 48 hours before surgery.  Contact lenses, hearing aids and dentures may not be worn into surgery.  Do not bring valuables to the hospital. [facility] is not responsible for any missing/lost belongings or valuables.   Notify your doctor if there is any change in your medical condition (cold, fever, infection).  Wear comfortable clothing (specific to your surgery type) to the hospital.  After surgery, you can help prevent lung complications by doing breathing exercises.  Take deep breaths and cough every 1-2 hours. Your doctor may order a device called an Incentive Spirometer to help you take deep breaths. When coughing or sneezing, hold a pillow firmly against your incision with both hands. This is called "splinting."  Doing this helps protect your incision. It also decreases belly discomfort.  If you are being admitted to the hospital overnight, leave your suitcase in the car. After surgery it may be brought to your room.  In case of increased patient census, it may be necessary for you, the patient, to continue your postoperative care in the Same Day Surgery  department.  If you are being discharged the day of surgery, you will not be allowed to drive home. You will need a responsible individual to drive you home and stay with you for 24 hours after surgery.   If you are taking public transportation, you will need to have a responsible individual with you.  Please call the Pre-admissions Testing Dept. at [phone] if you have any questions about these instructions.  Surgery Visitation Policy:  Patients having surgery or a procedure may have two visitors.  Children under the age of 87 must have an adult with them who is not the patient.  Inpatient Visitation:    Visiting hours are 7 a.m. to 8 p.m. Up to four visitors are allowed at one time in a patient room. The visitors may rotate out with other people during the day.  One visitor age 59 or older may stay with the patient overnight and must be in the room by 8 p.m.   Merchandiser, Retail to address health-related social needs:  [email or web address]

## 2024-05-08 ENCOUNTER — Inpatient Hospital Stay
Admission: RE | Admit: 2024-05-08 | Discharge: 2024-05-12 | DRG: 331 | Disposition: A | Attending: Surgery | Admitting: Surgery

## 2024-05-08 ENCOUNTER — Inpatient Hospital Stay: Payer: Self-pay | Admitting: Urgent Care

## 2024-05-08 ENCOUNTER — Encounter: Payer: Self-pay | Admitting: Surgery

## 2024-05-08 ENCOUNTER — Other Ambulatory Visit: Payer: Self-pay

## 2024-05-08 ENCOUNTER — Encounter: Admission: RE | Disposition: A | Payer: Self-pay | Source: Home / Self Care | Attending: Surgery

## 2024-05-08 ENCOUNTER — Inpatient Hospital Stay: Admitting: Anesthesiology

## 2024-05-08 DIAGNOSIS — Z01812 Encounter for preprocedural laboratory examination: Secondary | ICD-10-CM

## 2024-05-08 DIAGNOSIS — E119 Type 2 diabetes mellitus without complications: Secondary | ICD-10-CM

## 2024-05-08 DIAGNOSIS — K6389 Other specified diseases of intestine: Principal | ICD-10-CM | POA: Diagnosis present

## 2024-05-08 DIAGNOSIS — Z01818 Encounter for other preprocedural examination: Secondary | ICD-10-CM

## 2024-05-08 DIAGNOSIS — E1142 Type 2 diabetes mellitus with diabetic polyneuropathy: Secondary | ICD-10-CM

## 2024-05-08 DIAGNOSIS — N1831 Chronic kidney disease, stage 3a: Secondary | ICD-10-CM

## 2024-05-08 DIAGNOSIS — I350 Nonrheumatic aortic (valve) stenosis: Secondary | ICD-10-CM

## 2024-05-08 LAB — CBC
HCT: 27.7 % — ABNORMAL LOW (ref 36.0–46.0)
Hemoglobin: 8.9 g/dL — ABNORMAL LOW (ref 12.0–15.0)
MCH: 31.9 pg (ref 26.0–34.0)
MCHC: 32.1 g/dL (ref 30.0–36.0)
MCV: 99.3 fL (ref 80.0–100.0)
Platelets: 206 K/uL (ref 150–400)
RBC: 2.79 MIL/uL — ABNORMAL LOW (ref 3.87–5.11)
RDW: 14.2 % (ref 11.5–15.5)
WBC: 10 K/uL (ref 4.0–10.5)
nRBC: 0 % (ref 0.0–0.2)

## 2024-05-08 LAB — BASIC METABOLIC PANEL WITH GFR
Anion gap: 15 (ref 5–15)
BUN: 21 mg/dL (ref 8–23)
CO2: 19 mmol/L — ABNORMAL LOW (ref 22–32)
Calcium: 9.8 mg/dL (ref 8.9–10.3)
Chloride: 109 mmol/L (ref 98–111)
Creatinine, Ser: 1.63 mg/dL — ABNORMAL HIGH (ref 0.44–1.00)
GFR, Estimated: 33 mL/min — ABNORMAL LOW (ref >60)
Glucose, Bld: 117 mg/dL — ABNORMAL HIGH (ref 70–99)
Potassium: 3.8 mmol/L (ref 3.5–5.1)
Sodium: 142 mmol/L (ref 135–145)

## 2024-05-08 LAB — TYPE AND SCREEN
ABO/RH(D): O POS
Antibody Screen: NEGATIVE

## 2024-05-08 LAB — CREATININE, SERUM
Creatinine, Ser: 1.35 mg/dL — ABNORMAL HIGH (ref 0.44–1.00)
GFR, Estimated: 41 mL/min — ABNORMAL LOW (ref >60)

## 2024-05-08 LAB — GLUCOSE, CAPILLARY
Glucose-Capillary: 113 mg/dL — ABNORMAL HIGH (ref 70–99)
Glucose-Capillary: 124 mg/dL — ABNORMAL HIGH (ref 70–99)

## 2024-05-08 SURGERY — COLECTOMY, PARTIAL, ROBOT-ASSISTED, LAPAROSCOPIC
Anesthesia: General | Site: Abdomen

## 2024-05-08 MED ORDER — GABAPENTIN 800 MG PO TABS: 800.0000 mg | ORAL_TABLET | ORAL | Status: DC

## 2024-05-08 MED ORDER — PROPOFOL 1000 MG/100ML IV EMUL
INTRAVENOUS | Status: AC
  Filled 2024-05-08: qty 100

## 2024-05-08 MED ORDER — CHLORHEXIDINE GLUCONATE 0.12 % MT SOLN
15.0000 mL | Freq: Once | OROMUCOSAL | Status: AC
  Administered 2024-05-08: 15 mL via OROMUCOSAL

## 2024-05-08 MED ORDER — ALBUMIN HUMAN 5 % IV SOLN: INTRAVENOUS | Status: DC | PRN

## 2024-05-08 MED ORDER — 0.9 % SODIUM CHLORIDE (POUR BTL) OPTIME
TOPICAL | Status: DC | PRN
  Administered 2024-05-08: 1000 mL

## 2024-05-08 MED ORDER — HYDROMORPHONE HCL 1 MG/ML IJ SOLN: 0.5000 mg | INTRAMUSCULAR | Status: DC | PRN

## 2024-05-08 MED ORDER — PANTOPRAZOLE SODIUM 40 MG PO TBEC
40.0000 mg | DELAYED_RELEASE_TABLET | Freq: Two times a day (BID) | ORAL | Status: DC
  Administered 2024-05-08 – 2024-05-11 (×7): 40 mg via ORAL
  Filled 2024-05-08 (×7): qty 1

## 2024-05-08 MED ORDER — NITROGLYCERIN 0.4 MG SL SUBL: 0.4000 mg | SUBLINGUAL_TABLET | SUBLINGUAL | Status: DC | PRN

## 2024-05-08 MED ORDER — ACETAMINOPHEN 500 MG PO TABS
1000.0000 mg | ORAL_TABLET | ORAL | Status: AC
  Administered 2024-05-08: 1000 mg via ORAL

## 2024-05-08 MED ORDER — ENSURE PRE-SURGERY PO LIQD
592.0000 mL | Freq: Once | ORAL | Status: DC
  Filled 2024-05-08: qty 592

## 2024-05-08 MED ORDER — FENTANYL CITRATE (PF) 100 MCG/2ML IJ SOLN
INTRAMUSCULAR | Status: DC | PRN
  Administered 2024-05-08 (×4): 25 ug via INTRAVENOUS

## 2024-05-08 MED ORDER — LORATADINE 10 MG PO TABS: 10.0000 mg | ORAL_TABLET | Freq: Every day | ORAL | Status: DC | PRN

## 2024-05-08 MED ORDER — BUPIVACAINE-EPINEPHRINE (PF) 0.25% -1:200000 IJ SOLN
INTRAMUSCULAR | Status: DC | PRN
  Administered 2024-05-08: 25 mL via PERINEURAL

## 2024-05-08 MED ORDER — ENOXAPARIN SODIUM 40 MG/0.4ML IJ SOSY
40.0000 mg | PREFILLED_SYRINGE | INTRAMUSCULAR | Status: DC
  Administered 2024-05-09 – 2024-05-12 (×4): 40 mg via SUBCUTANEOUS
  Filled 2024-05-08 (×4): qty 0.4

## 2024-05-08 MED ORDER — AMLODIPINE BESYLATE 5 MG PO TABS
5.0000 mg | ORAL_TABLET | ORAL | Status: DC
  Administered 2024-05-09 – 2024-05-11 (×3): 5 mg via ORAL
  Filled 2024-05-08 (×3): qty 1

## 2024-05-08 MED ORDER — ROCURONIUM BROMIDE 100 MG/10ML IV SOLN
INTRAVENOUS | Status: DC | PRN
  Administered 2024-05-08: 30 mg via INTRAVENOUS
  Administered 2024-05-08: 50 mg via INTRAVENOUS
  Administered 2024-05-08: 10 mg via INTRAVENOUS

## 2024-05-08 MED ORDER — OXYCODONE HCL 5 MG PO TABS
ORAL_TABLET | ORAL | Status: AC
  Filled 2024-05-08: qty 1

## 2024-05-08 MED ORDER — PROPOFOL 10 MG/ML IV BOLUS
INTRAVENOUS | Status: DC | PRN
  Administered 2024-05-08: 50 mg via INTRAVENOUS
  Administered 2024-05-08: 120 mg via INTRAVENOUS

## 2024-05-08 MED ORDER — ACETAMINOPHEN 500 MG PO TABS
ORAL_TABLET | ORAL | Status: AC
  Filled 2024-05-08: qty 2

## 2024-05-08 MED ORDER — SODIUM CHLORIDE 0.9 % IR SOLN
Status: DC | PRN
  Administered 2024-05-08: 500 mL

## 2024-05-08 MED ORDER — FENTANYL CITRATE (PF) 100 MCG/2ML IJ SOLN: 25.0000 ug | INTRAMUSCULAR | Status: DC | PRN

## 2024-05-08 MED ORDER — LIDOCAINE HCL (PF) 2 % IJ SOLN
INTRAMUSCULAR | Status: AC
  Filled 2024-05-08: qty 5

## 2024-05-08 MED ORDER — SODIUM CHLORIDE 0.9 % IV SOLN: INTRAVENOUS | Status: DC

## 2024-05-08 MED ORDER — GABAPENTIN 400 MG PO CAPS
1600.0000 mg | ORAL_CAPSULE | Freq: Every day | ORAL | Status: DC
  Administered 2024-05-08 – 2024-05-11 (×4): 1600 mg via ORAL
  Filled 2024-05-08 (×4): qty 4

## 2024-05-08 MED ORDER — MIDAZOLAM HCL 2 MG/2ML IJ SOLN
INTRAMUSCULAR | Status: AC
  Filled 2024-05-08: qty 2

## 2024-05-08 MED ORDER — LACTATED RINGERS IV SOLN: INTRAVENOUS | Status: DC | PRN

## 2024-05-08 MED ORDER — ONDANSETRON HCL 4 MG/2ML IJ SOLN: 4.0000 mg | Freq: Once | INTRAMUSCULAR | Status: DC | PRN

## 2024-05-08 MED ORDER — ROCURONIUM BROMIDE 10 MG/ML (PF) SYRINGE
PREFILLED_SYRINGE | INTRAVENOUS | Status: AC
  Filled 2024-05-08: qty 10

## 2024-05-08 MED ORDER — MIDAZOLAM HCL (PF) 2 MG/2ML IJ SOLN
INTRAMUSCULAR | Status: DC | PRN
  Administered 2024-05-08 (×2): 1 mg via INTRAVENOUS

## 2024-05-08 MED ORDER — PROPOFOL 500 MG/50ML IV EMUL
INTRAVENOUS | Status: DC | PRN
  Administered 2024-05-08: 125 ug/kg/min via INTRAVENOUS

## 2024-05-08 MED ORDER — SODIUM CHLORIDE 0.9 % IV SOLN
INTRAVENOUS | Status: AC
  Filled 2024-05-08: qty 2

## 2024-05-08 MED ORDER — ALBUMIN HUMAN 5 % IV SOLN
INTRAVENOUS | Status: AC
  Filled 2024-05-08: qty 250

## 2024-05-08 MED ORDER — ALVIMOPAN 12 MG PO CAPS
ORAL_CAPSULE | ORAL | Status: AC
  Filled 2024-05-08: qty 1

## 2024-05-08 MED ORDER — CARVEDILOL 6.25 MG PO TABS
6.2500 mg | ORAL_TABLET | Freq: Two times a day (BID) | ORAL | Status: DC
  Administered 2024-05-08 – 2024-05-12 (×8): 6.25 mg via ORAL
  Filled 2024-05-08 (×8): qty 1

## 2024-05-08 MED ORDER — ONDANSETRON HCL 4 MG/2ML IJ SOLN
INTRAMUSCULAR | Status: AC
  Filled 2024-05-08: qty 2

## 2024-05-08 MED ORDER — PHENYLEPHRINE 80 MCG/ML (10ML) SYRINGE FOR IV PUSH (FOR BLOOD PRESSURE SUPPORT)
PREFILLED_SYRINGE | INTRAVENOUS | Status: DC | PRN
  Administered 2024-05-08 (×2): 80 ug via INTRAVENOUS
  Administered 2024-05-08: 120 ug via INTRAVENOUS

## 2024-05-08 MED ORDER — ROSUVASTATIN CALCIUM 10 MG PO TABS
40.0000 mg | ORAL_TABLET | Freq: Every day | ORAL | Status: DC
  Administered 2024-05-08 – 2024-05-11 (×4): 40 mg via ORAL
  Filled 2024-05-08 (×4): qty 4

## 2024-05-08 MED ORDER — ONDANSETRON HCL 4 MG/2ML IJ SOLN
INTRAMUSCULAR | Status: DC | PRN
  Administered 2024-05-08: 4 mg via INTRAVENOUS

## 2024-05-08 MED ORDER — ONDANSETRON 4 MG PO TBDP: 4.0000 mg | ORAL_TABLET | Freq: Four times a day (QID) | ORAL | Status: DC | PRN

## 2024-05-08 MED ORDER — ONDANSETRON HCL 4 MG/2ML IJ SOLN: 4.0000 mg | Freq: Four times a day (QID) | INTRAMUSCULAR | Status: DC | PRN

## 2024-05-08 MED ORDER — CHLORHEXIDINE GLUCONATE CLOTH 2 % EX PADS
6.0000 | MEDICATED_PAD | Freq: Once | CUTANEOUS | Status: AC
  Administered 2024-05-08: 6 via TOPICAL

## 2024-05-08 MED ORDER — PIPERACILLIN-TAZOBACTAM 3.375 G IVPB
3.3750 g | Freq: Three times a day (TID) | INTRAVENOUS | Status: DC
  Administered 2024-05-08 – 2024-05-11 (×9): 3.375 g via INTRAVENOUS
  Filled 2024-05-08 (×9): qty 50

## 2024-05-08 MED ORDER — OXYCODONE HCL 5 MG PO TABS
5.0000 mg | ORAL_TABLET | Freq: Once | ORAL | Status: AC | PRN
  Administered 2024-05-08: 5 mg via ORAL

## 2024-05-08 MED ORDER — SUGAMMADEX SODIUM 200 MG/2ML IV SOLN
INTRAVENOUS | Status: DC | PRN
  Administered 2024-05-08: 200 mg via INTRAVENOUS

## 2024-05-08 MED ORDER — SODIUM CHLORIDE 0.9 % IV SOLN
2.0000 g | INTRAVENOUS | Status: AC
  Administered 2024-05-08: 2 g via INTRAVENOUS

## 2024-05-08 MED ORDER — OXYCODONE HCL 5 MG/5ML PO SOLN: 5.0000 mg | Freq: Once | ORAL | Status: AC | PRN

## 2024-05-08 MED ORDER — INDOCYANINE GREEN 25 MG IJ SOLR
INTRAMUSCULAR | Status: DC | PRN
  Administered 2024-05-08: 5 mg via INTRAVENOUS

## 2024-05-08 MED ORDER — GABAPENTIN 400 MG PO CAPS
800.0000 mg | ORAL_CAPSULE | Freq: Two times a day (BID) | ORAL | Status: DC
  Administered 2024-05-09 – 2024-05-12 (×7): 800 mg via ORAL
  Filled 2024-05-08 (×6): qty 2

## 2024-05-08 MED ORDER — ENSURE PRE-SURGERY PO LIQD
296.0000 mL | Freq: Once | ORAL | Status: DC
  Filled 2024-05-08: qty 296

## 2024-05-08 MED ORDER — BUPIVACAINE-EPINEPHRINE (PF) 0.5% -1:200000 IJ SOLN
INTRAMUSCULAR | Status: AC
  Filled 2024-05-08: qty 30

## 2024-05-08 MED ORDER — PROPOFOL 10 MG/ML IV BOLUS
INTRAVENOUS | Status: AC
  Filled 2024-05-08: qty 20

## 2024-05-08 MED ORDER — ACETAMINOPHEN 500 MG PO TABS
500.0000 mg | ORAL_TABLET | Freq: Four times a day (QID) | ORAL | Status: DC | PRN
  Administered 2024-05-09 (×2): 500 mg via ORAL
  Filled 2024-05-08 (×2): qty 1

## 2024-05-08 MED ORDER — FENTANYL CITRATE (PF) 100 MCG/2ML IJ SOLN
INTRAMUSCULAR | Status: AC
  Filled 2024-05-08: qty 2

## 2024-05-08 MED ORDER — SODIUM CHLORIDE 0.9 % IV SOLN: 2.0000 g | INTRAVENOUS | Status: DC

## 2024-05-08 MED ORDER — ORAL CARE MOUTH RINSE: 15.0000 mL | Freq: Once | OROMUCOSAL | Status: AC

## 2024-05-08 MED ORDER — LIDOCAINE HCL (CARDIAC) PF 100 MG/5ML IV SOSY
PREFILLED_SYRINGE | INTRAVENOUS | Status: DC | PRN
  Administered 2024-05-08: 100 mg via INTRAVENOUS

## 2024-05-08 MED ORDER — CHLORHEXIDINE GLUCONATE 0.12 % MT SOLN
OROMUCOSAL | Status: AC
  Filled 2024-05-08: qty 15

## 2024-05-08 SURGICAL SUPPLY — 58 items
BASIN KIT SINGLE STR (MISCELLANEOUS) ×1 IMPLANT
BLADE CLIPPER SURG (BLADE) ×1 IMPLANT
CANNULA CAP OBTURATR AIRSEAL 8 (CAP) ×1 IMPLANT
COVER TIP SHEARS 8 DVNC (MISCELLANEOUS) ×1 IMPLANT
DEFOGGER SCOPE WARM SEASHARP (MISCELLANEOUS) ×1 IMPLANT
DERMABOND ADVANCED .7 DNX12 (GAUZE/BANDAGES/DRESSINGS) IMPLANT
DRAIN CHANNEL JP 15F RND 3/16 (MISCELLANEOUS) IMPLANT
DRAPE ARM DVNC X/XI (DISPOSABLE) ×4 IMPLANT
DRAPE COLUMN DVNC XI (DISPOSABLE) ×1 IMPLANT
DRSG OPSITE POSTOP 4X10 (GAUZE/BANDAGES/DRESSINGS) IMPLANT
DRSG OPSITE POSTOP 4X8 (GAUZE/BANDAGES/DRESSINGS) IMPLANT
ELECTRODE REM PT RTRN 9FT ADLT (ELECTROSURGICAL) ×1 IMPLANT
EVACUATOR SILICONE 100CC (DRAIN) IMPLANT
FORCEPS BPLR FENES DVNC XI (FORCEP) ×1 IMPLANT
GLOVE BIOGEL PI IND STRL 7.0 (GLOVE) ×3 IMPLANT
GLOVE SURG SYN 6.5 PF PI (GLOVE) ×5 IMPLANT
GOWN STRL REUS W/ TWL LRG LVL3 (GOWN DISPOSABLE) ×7 IMPLANT
GRASPER SUT TROCAR 14GX15 (MISCELLANEOUS) IMPLANT
GRASPER TIP-UP FEN DVNC XI (INSTRUMENTS) ×1 IMPLANT
HANDLE YANKAUER SUCT BULB TIP (MISCELLANEOUS) ×1 IMPLANT
IRRIGATION STRYKERFLOW (MISCELLANEOUS) IMPLANT
IRRIGATOR SUCT 8 DISP DVNC XI (IRRIGATION / IRRIGATOR) IMPLANT
IV 0.9% NACL 1000 ML (IV SOLUTION) IMPLANT
KIT IMAGING PINPOINTPAQ (MISCELLANEOUS) ×1 IMPLANT
KIT PINK PAD W/HEAD ARM REST (MISCELLANEOUS) ×1 IMPLANT
LABEL OR SOLS (LABEL) IMPLANT
MANIFOLD NEPTUNE II (INSTRUMENTS) ×1 IMPLANT
NDL DRIVE SUT CUT DVNC (INSTRUMENTS) ×1 IMPLANT
NDL HYPO 22X1.5 SAFETY MO (MISCELLANEOUS) ×1 IMPLANT
NDL INSUFFLATION 14GA 120MM (NEEDLE) ×1 IMPLANT
OBTURATOR OPTICALSTD 8 DVNC (TROCAR) ×1 IMPLANT
PACK COLON CLEAN CLOSURE (MISCELLANEOUS) ×1 IMPLANT
PACK LAP CHOLECYSTECTOMY (MISCELLANEOUS) ×1 IMPLANT
RELOAD STAPLE 60 3.5 BLU DVNC (STAPLE) IMPLANT
RETRACTOR WOUND ALXS 18CM MED (MISCELLANEOUS) IMPLANT
RETRACTOR WOUND ALXS 18CM SML (MISCELLANEOUS) IMPLANT
SCISSORS MNPLR CVD DVNC XI (INSTRUMENTS) ×1 IMPLANT
SEAL UNIV 5-12 XI (MISCELLANEOUS) ×3 IMPLANT
SEALER VESSEL EXT DVNC XI (MISCELLANEOUS) IMPLANT
SET TUBE FILTERED XL AIRSEAL (SET/KITS/TRAYS/PACK) ×1 IMPLANT
SOLN STERILE WATER 500 ML (IV SOLUTION) ×1 IMPLANT
SOLUTION ELECTROSURG ANTI STCK (MISCELLANEOUS) ×1 IMPLANT
SPONGE T-LAP 18X18 ~~LOC~~+RFID (SPONGE) ×1 IMPLANT
STAPLER 60 SUREFORM DVNC (STAPLE) IMPLANT
STAPLER SKIN PROX 35W (STAPLE) IMPLANT
SUT PDS AB 1 CT1 36 (SUTURE) ×2 IMPLANT
SUT SILK 3 0 SH 30 (SUTURE) IMPLANT
SUT STRATA 3-0 23 RB-1.5 (SUTURE) IMPLANT
SUT STRATAFIX SPIRAL PDS3-0 (SUTURE) IMPLANT
SUT VIC AB 3-0 SH 27X BRD (SUTURE) IMPLANT
SUT VICRYL 0 UR6 27IN ABS (SUTURE) ×1 IMPLANT
SUTURE EHLN 3-0 FS-10 30 BLK (SUTURE) IMPLANT
SUTURE MNCRL 4-0 27XMF (SUTURE) ×1 IMPLANT
SYR 30ML LL (SYRINGE) ×2 IMPLANT
SYSTEM LAPSCP GELPORT 120MM (MISCELLANEOUS) IMPLANT
SYSTEM TROCR 1.5-3 SLV ABD GEL (ENDOMECHANICALS) IMPLANT
SYSTEM WECK SHIELD CLOSURE (TROCAR) IMPLANT
TRAY FOLEY MTR SLVR 16FR STAT (SET/KITS/TRAYS/PACK) ×1 IMPLANT

## 2024-05-08 NOTE — Interval H&P Note (Signed)
 No change. OK to proceed.

## 2024-05-08 NOTE — Anesthesia Preprocedure Evaluation (Addendum)
 Anesthesia Evaluation  Patient identified by MRN, date of birth, ID band Patient awake    Reviewed: Allergy & Precautions, NPO status , Patient's Chart, lab work & pertinent test results  Airway Mallampati: II  TM Distance: >3 FB Neck ROM: full    Dental  (+) Upper Dentures, Partial Lower   Pulmonary COPD, Patient abstained from smoking., former smoker   Pulmonary exam normal  + decreased breath sounds      Cardiovascular Exercise Tolerance: Good hypertension, Pt. on medications + CAD, + CABG and + DOE  Normal cardiovascular exam Rhythm:Regular Rate:Normal  S/p aortic valve replacement   Neuro/Psych TIAnegative neurological ROS  negative psych ROS   GI/Hepatic negative GI ROS, Neg liver ROS,GERD  Medicated,,  Endo/Other  diabetes, Type 2, Oral Hypoglycemic Agents  Class 4 obesity  Renal/GU CRFRenal disease     Musculoskeletal   Abdominal  (+) + obese  Peds  Hematology negative hematology ROS (+)   Anesthesia Other Findings Past Medical History: No date: Anemia No date: Arthritis No date: CKD (chronic kidney disease) stage 3, GFR 30-59 ml/min (HCC) No date: Colonic mass No date: Coronary artery disease No date: Diverticulitis No date: DM (diabetes mellitus), type 2 (HCC) No date: GERD (gastroesophageal reflux disease)     Comment:  no meds No date: Heart murmur No date: Hepatic steatosis No date: Hyperlipidemia No date: Hypertension No date: Neuropathy No date: 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 Wonda & Dr. Lucas No date: Severe aortic stenosis  Past Surgical History: No date: ABDOMINAL HYSTERECTOMY 20+ yrs ago: BREAST EXCISIONAL BIOPSY; Right     Comment:  benign No date: CARDIAC SURGERY     Comment:  cabg No date: CARPAL TUNNEL RELEASE; Bilateral 02/24/2017:  COLONOSCOPY; N/A     Comment:  Procedure: COLONOSCOPY;  Surgeon: Toledo, [name], MD;              Location: [facility] ENDOSCOPY;  Service: Endoscopy;                Laterality: N/A; 07/28/2022: COLONOSCOPY; N/A     Comment:  Procedure: COLONOSCOPY;  Surgeon: Toledo, [name], MD;              Location: [facility] ENDOSCOPY;  Service: Gastroenterology;                Laterality: N/A; 03/21/2024: COLONOSCOPY; N/A     Comment:  Procedure: COLONOSCOPY;  Surgeon: Therisa Bi, MD;                Location: [facility] ENDOSCOPY;  Service: Gastroenterology;                Laterality: N/A;  DM   Plavix  No date: CORONARY ARTERY BYPASS GRAFT     Comment:  1996 09/15/2022: CORONARY/GRAFT ANGIOGRAPHY; N/A     Comment:  Procedure: CORONARY/GRAFT ANGIOGRAPHY;  Surgeon: [name], MD;  Location: [facility] INVASIVE CV LAB;  Service:               Cardiovascular;  Laterality: N/A; 05/14/2023: ENDARTERECTOMY; Right     Comment:  Procedure: ENDARTERECTOMY CAROTID;  Surgeon: Jama,  Cordella MATSU, MD;  Location: [facility] ORS;  Service: Vascular;                Laterality: Right; 03/21/2024: ESOPHAGOGASTRODUODENOSCOPY; N/A     Comment:  Procedure: EGD (ESOPHAGOGASTRODUODENOSCOPY);  Surgeon:               Therisa Bi, MD;  Location: [facility] ENDOSCOPY;  Service:               Gastroenterology;  Laterality: N/A; 07/27/2022: ESOPHAGOGASTRODUODENOSCOPY (EGD) WITH PROPOFOL ; N/A     Comment:  Procedure: ESOPHAGOGASTRODUODENOSCOPY (EGD) WITH               PROPOFOL ;  Surgeon: Toledo, [name], MD;  Location:               [facility] ENDOSCOPY;  Service: Gastroenterology;  Laterality:               N/A; No date: HERNIA REPAIR     Comment:  umbilical 10/13/2022: INTRAOPERATIVE TRANSTHORACIC ECHOCARDIOGRAM; N/A     Comment:  Procedure: INTRAOPERATIVE TRANSTHORACIC ECHOCARDIOGRAM;               Surgeon: [name], MD;  Location: [facility] INVASIVE CV               LAB;  Service: Open Heart Surgery;  Laterality:  N/A; 06/25/2021: LUMBAR LAMINECTOMY/DECOMPRESSION MICRODISCECTOMY; N/A     Comment:  Procedure: L2-3 DECOMPRESSION;  Surgeon: [name], MD;  Location: [facility] ORS;  Service: Neurosurgery;              Laterality: N/A; 10/25/2018: TOTAL KNEE ARTHROPLASTY; Left     Comment:  Procedure: TOTAL KNEE ARTHROPLASTY - LEFT - DIABETIC;                Surgeon: [name], MD;  Location: [facility] ORS;                Service: Orthopedics;  Laterality: Left; 10/13/2022: TRANSCATHETER AORTIC VALVE REPLACEMENT, TRANSFEMORAL; Right     Comment:  Procedure: Transcatheter Aortic Valve Replacement,               Transfemoral;  Surgeon: [name], MD;  Location:               [facility] INVASIVE CV LAB;  Service: Open Heart Surgery;                Laterality: Right;  BMI    Body Mass Index: 32.77 kg/m      Reproductive/Obstetrics negative OB ROS                              Anesthesia Physical Anesthesia Plan  ASA: 3  Anesthesia Plan: General   Post-op Pain Management:    Induction: Intravenous  PONV Risk Score and Plan: Ondansetron , Dexamethasone , Midazolam  and Treatment may vary due to age or medical condition  Airway Management Planned: Oral ETT  Additional Equipment:   Intra-op Plan:   Post-operative Plan: Extubation in OR  Informed Consent: I have reviewed the patients History and Physical, chart, labs and discussed the procedure including the risks, benefits and alternatives for the proposed anesthesia with the patient or authorized representative who has indicated his/her understanding and acceptance.     Dental Advisory Given  Plan Discussed with: CRNA  Anesthesia Plan Comments:  Anesthesia Quick Evaluation

## 2024-05-08 NOTE — Transfer of Care (Signed)
 Immediate Anesthesia Transfer of Care Note  Patient: Renee [name]  Procedure(s) Performed: TRANSVERSE COLECTOMY, ROBOT-ASSISTED, LAPAROSCOPIC (Abdomen)  Patient Location: PACU  Anesthesia Type:General  Level of Consciousness: drowsy  Airway & Oxygen Therapy: Patient Spontanous Breathing and Patient connected to face mask oxygen  Post-op Assessment: Report given to RN and Post -op Vital signs reviewed and stable  Post vital signs: Reviewed and stable  Last Vitals:  Vitals Value Taken Time  BP    Temp    Pulse 90 05/08/24 16:50  Resp 16 05/08/24 16:50  SpO2 99 % 05/08/24 16:50  Vitals shown include unfiled device data.  Last Pain:  Vitals:   05/08/24 1124  TempSrc: Temporal  PainSc: 0-No pain         Complications: No notable events documented.

## 2024-05-08 NOTE — Op Note (Signed)
 Preoperative diagnosis: colon mass  Postoperative diagnosis: Same  Procedure: Robotic assisted laparoscopic transverse colectomy.   Anesthesia: GETA   Surgeon: Henriette Pierre    Wound Classification: clean contaminated   Specimen: transverse colon and proximal transverse colon   Complications: None   Estimated Blood Loss: 80 mL   Indications:  Please see H&P for further details.     FIndings: 1.  transverse colon mass with tattoo 2.  Adequate hemostasis.  3.  No gross metastasis noted    Description of procedure: The patient was placed on the operating table in the supine position, both arms tucked. General anesthesia was induced. A time-out was completed verifying correct patient, procedure, site, positioning, and implant(s) and/or special equipment prior to beginning this procedure. The abdomen was prepped and draped in the usual sterile fashion.    Palmer's point located and Veress needle was inserted.  After confirming 2 clicks and a positive saline drop test, gas insufflation was initiated until the abdominal pressure was measured at 15 mmHg.  Afterwards, the Veress needle was removed and a 8 mm port was placed near site using Optiview technique after incision with an 11 blade.  After local was infused, 3 additional incision was made 8 cm apart along the left side of the abdominal wall from the initial incision and two 8mm and one 12mm port placed under direct visualization.  No injuries from trocar placements were noted. The table was placed in the reverse Trendelenburg position with the right side elevated.  Xi robotic platform was then brought to the operative field and docked at an angle from the left lower quadrant.  Tip up grasper and hook cautery was placed in right arm ports.  Fenestrated bipolar in left arm port.  Examination of the abdominal cavity noted no signs of gross metastasis.  Dissection was started by removing the attachments of the right colon along the white  line of Toldt, ensuring the right ureter was not involved.  This was carried around the hepatic flexure  past the tattooed area on the transverse colon.  Omentum was transected from the transverse colon to the same point.  Afterwards, the right colon was grasped and elevated to visualize mesentary. Point was chosen on the transverse colon for staple transection, 5cm distal to the tattooed area.  60mm blue load stapler was then used to transect the colon at this point.  Vessel sealer was then used to transect the transverse colon mesentery approx 5cm proximal to visible tattooed area, care taken to ensure as much mesentery was taken for lymph node evaluation, and also visualizing the duodenum and placing it away from area of transection during this portion.  60mm blue load stapler was used to transect the transverse colon.     The transected specimen grasped through 12mm port.  Robot undocked, and 12 mm port site incision extended until small gelport able to be placed around port.  The  port then removed along with colon specimen.  Inspection of the specimen opening it on side table noted mass to be at staple line.  Inspection of the distal staple line within the cavity noted soft, normal thickness bowel on palpation, so decision made to transect additional portion of the proximal transverse colon to ensure mass removed completely.  12mm port placed through gelport and insufflation resumed.  Robot redocked and additional dissection carried out more proximal and proximal colon transected with blue load stapler.  The specimen placed atop liver.   The ascending colon and small  amount of proximal colon was then brought towards the distal transverse colon under minimal tension due to previous lateral dissection.  ICG was then infused and the staple lines were confirmed to have adequate blood flow. Side-to-side anastomosis was created.  Enterotomies were made in the proximal colon and the distal transverse colon 2 cm  from the staple lines. 60 mm blue load stapler placed through these enterotomies and new anastomosis created.  The enterotomy was then closed with running 3-0 STRATAFIX in a 2 layer fashion. Of note, when enterotomy created, fair amount of stool noted to be within proximal colon, with moderate spillage of solid contents within the anastamosis area, causing infection to be present within the abdomen.  The spilled contents will suctioned and irrigated out of the visible operative field.    No bleeding or additional pathology was noted. 15Fr round drain placed through upper quadrant port due to spillage and placed over anastamosis. Robot was then undocked, proximal colon specimen removed through previously placed gelport, passed off field along with initial transverse colon for pathology.  The remaining port sites were removed, the abdomen was allowed to collapse. Gelport removed.  Clean closure protocol initiated and extraction site closed using 1 PDS x 2.  All skin incisions then closed with subcuticular sutures Monocryl 4-0. Drain secured to skin using 3-0 nylon. All Wounds then dressed with dermabond, drain site with 4x4 gauze and tegaderm.   The patient tolerated the procedure well, awakened from anesthesia and was taken to the postanesthesia care unit in satisfactory condition.  Foley still in place.  Sponge count and instrument count correct at the end of the procedure.

## 2024-05-08 NOTE — Anesthesia Postprocedure Evaluation (Signed)
 Anesthesia Post Note  Patient: Renee [name]  Procedure(s) Performed: TRANSVERSE COLECTOMY, ROBOT-ASSISTED, LAPAROSCOPIC (Abdomen)  Patient location during evaluation: PACU Anesthesia Type: General Level of consciousness: awake and alert Pain management: pain level controlled Vital Signs Assessment: post-procedure vital signs reviewed and stable Respiratory status: spontaneous breathing, nonlabored ventilation and respiratory function stable Cardiovascular status: blood pressure returned to baseline and stable Postop Assessment: no apparent nausea or vomiting Anesthetic complications: no   No notable events documented.   Last Vitals:  Vitals:   05/08/24 1730 05/08/24 1750  BP: (!) 126/92 (!) 159/70  Pulse: 92 94  Resp: 17 14  Temp:  36.6 C  SpO2: 98% 98%    Last Pain:  Vitals:   05/08/24 1759  TempSrc:   PainSc: 0-No pain                 Fairy POUR Kristi Hyer

## 2024-05-08 NOTE — Anesthesia Procedure Notes (Signed)
 Procedure Name: Intubation Date/Time: 05/08/2024 1:06 PM  Performed by: Brien Sotero PARAS, CRNAPre-anesthesia Checklist: Patient identified, Patient being monitored, Timeout performed, Emergency Drugs available and Suction available Patient Re-evaluated:Patient Re-evaluated prior to induction Oxygen Delivery Method: Circle system utilized Preoxygenation: Pre-oxygenation with 100% oxygen Induction Type: IV induction Ventilation: Mask ventilation without difficulty Laryngoscope Size: Mac, 3 and McGrath Grade View: Grade I Tube type: Oral Tube size: 7.0 mm Number of attempts: 1 Airway Equipment and Method: Stylet Placement Confirmation: ETT inserted through vocal cords under direct vision, positive ETCO2 and breath sounds checked- equal and bilateral Secured at: 21 cm Tube secured with: Tape Dental Injury: Teeth and Oropharynx as per pre-operative assessment

## 2024-05-09 LAB — BASIC METABOLIC PANEL WITH GFR
Anion gap: 13 (ref 5–15)
BUN: 16 mg/dL (ref 8–23)
CO2: 22 mmol/L (ref 22–32)
Calcium: 8.7 mg/dL — ABNORMAL LOW (ref 8.9–10.3)
Chloride: 110 mmol/L (ref 98–111)
Creatinine, Ser: 1.32 mg/dL — ABNORMAL HIGH (ref 0.44–1.00)
GFR, Estimated: 42 mL/min — ABNORMAL LOW (ref >60)
Glucose, Bld: 106 mg/dL — ABNORMAL HIGH (ref 70–99)
Potassium: 3.3 mmol/L — ABNORMAL LOW (ref 3.5–5.1)
Sodium: 145 mmol/L (ref 135–145)

## 2024-05-09 LAB — CBC
HCT: 26.1 % — ABNORMAL LOW (ref 36.0–46.0)
Hemoglobin: 8.4 g/dL — ABNORMAL LOW (ref 12.0–15.0)
MCH: 32.6 pg (ref 26.0–34.0)
MCHC: 32.2 g/dL (ref 30.0–36.0)
MCV: 101.2 fL — ABNORMAL HIGH (ref 80.0–100.0)
Platelets: 194 K/uL (ref 150–400)
RBC: 2.58 MIL/uL — ABNORMAL LOW (ref 3.87–5.11)
RDW: 14.1 % (ref 11.5–15.5)
WBC: 6.9 K/uL (ref 4.0–10.5)
nRBC: 0 % (ref 0.0–0.2)

## 2024-05-09 LAB — CEA: CEA: 2.4 ng/mL (ref 0.0–4.7)

## 2024-05-09 NOTE — Progress Notes (Signed)
 [facility]- General Surgery  SURGICAL PROGRESS NOTE  Hospital Day(s): 1.   Post op day(s): 1 Day Post-Op.   Interval History:  Doing well this morning.  Sitting on the side of the bed.  Admits abdomen is tender.  Has not had any pain medication.Tolerating clear liquids. Denies nausea or vomiting.  No fluctuance or bowel movement reported. Recorded drain output 70 cc  Vital signs in last 24 hours: [min-max] current  Temp:  [96.7 F (35.9 C)-100.4 F (38 C)] 100.4 F (38 C) (12/02 0731) Pulse Rate:  [83-96] 87 (12/02 0731) Resp:  [14-18] 18 (12/02 0731) BP: (114-159)/(55-98) 114/56 (12/02 0731) SpO2:  [97 %-100 %] 99 % (12/02 0731) Weight:  [83.9 kg] 83.9 kg (12/01 1124)     Height: 5' 3 (160 cm) Weight: 83.9 kg BMI (Calculated): 32.78   Intake/Output last 2 shifts:  12/01 0701 - 12/02 0700 In: 3224.4 [I.V.:2809.2; IV Piggyback:415.3] Out: 660 [Urine:585; Drains:70; Blood:5]   Physical Exam:  Constitutional: alert, cooperative and no distress  Respiratory: breathing non-labored at rest  Cardiovascular: regular rate and sinus rhythm  Gastrointestinal: soft, mildly tender, and mildly distended, surgical incisions are clean and dry.  Moderate sanguineous output in JP drain  Labs:     Latest Ref Rng & Units 05/09/2024    3:49 AM 05/08/2024    6:05 PM 05/15/2023    4:35 AM  CBC  WBC 4.0 - 10.5 K/uL 6.9  10.0  6.9   Hemoglobin 12.0 - 15.0 g/dL 8.4  8.9  9.1   Hematocrit 36.0 - 46.0 % 26.1  27.7  27.4   Platelets 150 - 400 K/uL 194  206  141       Latest Ref Rng & Units 05/09/2024    3:49 AM 05/08/2024    6:05 PM 05/08/2024   11:08 AM  CMP  Glucose 70 - 99 mg/dL 893   882   BUN 8 - 23 mg/dL 16   21   Creatinine 9.55 - 1.00 mg/dL 8.67  8.64  8.36   Sodium 135 - 145 mmol/L 145   142   Potassium 3.5 - 5.1 mmol/L 3.3   3.8   Chloride 98 - 111 mmol/L 110   109   CO2 22 - 32 mmol/L 22   19   Calcium  8.9 - 10.3 mg/dL 8.7   9.8     Imaging studies: No new pertinent  imaging studies   Assessment/Plan:  73 y.o. female with colon mass 1 Day Post-Op s/p robotic assisted laparoscopic transverse colectomy, complicated by pertinent comorbidities including CAD status post CABG x 1, aortic stenosis, CKD stage III, hypertension, hyperlipidemia, diabetes type 2, anemia, and tobacco user.   - Abdominal discomfort minimal, not requiring any pain medication.  Presenting with a temp of 100.4 this morning, no leukocytosis and patient not presenting with any other symptoms. Plan to repeat temperature.  - Continue clear liquid diet  - Continue to monitor hemoglobin  - Remove Foley catheter  - Continue IV Zosyn   - Encouraged to ambulate  -- Yianna Tersigni Barrientos PA-C

## 2024-05-09 NOTE — Plan of Care (Signed)

## 2024-05-10 LAB — BASIC METABOLIC PANEL WITH GFR
Anion gap: 12 (ref 5–15)
BUN: 12 mg/dL (ref 8–23)
CO2: 23 mmol/L (ref 22–32)
Calcium: 8.9 mg/dL (ref 8.9–10.3)
Chloride: 109 mmol/L (ref 98–111)
Creatinine, Ser: 1.13 mg/dL — ABNORMAL HIGH (ref 0.44–1.00)
GFR, Estimated: 51 mL/min — ABNORMAL LOW (ref >60)
Glucose, Bld: 108 mg/dL — ABNORMAL HIGH (ref 70–99)
Potassium: 3.2 mmol/L — ABNORMAL LOW (ref 3.5–5.1)
Sodium: 143 mmol/L (ref 135–145)

## 2024-05-10 LAB — CBC
HCT: 27.5 % — ABNORMAL LOW (ref 36.0–46.0)
Hemoglobin: 8.9 g/dL — ABNORMAL LOW (ref 12.0–15.0)
MCH: 31.9 pg (ref 26.0–34.0)
MCHC: 32.4 g/dL (ref 30.0–36.0)
MCV: 98.6 fL (ref 80.0–100.0)
Platelets: 175 K/uL (ref 150–400)
RBC: 2.79 MIL/uL — ABNORMAL LOW (ref 3.87–5.11)
RDW: 14.2 % (ref 11.5–15.5)
WBC: 6.3 K/uL (ref 4.0–10.5)
nRBC: 0 % (ref 0.0–0.2)

## 2024-05-10 MED ORDER — POTASSIUM CHLORIDE CRYS ER 20 MEQ PO TBCR
40.0000 meq | EXTENDED_RELEASE_TABLET | Freq: Once | ORAL | Status: AC
  Administered 2024-05-10: 40 meq via ORAL
  Filled 2024-05-10: qty 2

## 2024-05-10 MED ORDER — SIMETHICONE 80 MG PO CHEW
80.0000 mg | CHEWABLE_TABLET | Freq: Four times a day (QID) | ORAL | Status: DC
  Administered 2024-05-10 – 2024-05-11 (×7): 80 mg via ORAL
  Filled 2024-05-10 (×7): qty 1

## 2024-05-10 NOTE — Progress Notes (Addendum)
 [facility]- General Surgery  SURGICAL PROGRESS NOTE  Hospital Day(s): 2.   Post op day(s): 2 Days Post-Op.   Interval History:  Patient seen and examined. No acute events or new complaints overnight.  Patient reports tolerating clear liquids. Denies any nausea or vomiting. Still no bowel movement or gas reported. Ambulated yesterday around nursing station with minimal abdominal discomfort. Has not needed pain medication.    Vital signs in last 24 hours: [min-max] current  Temp:  [98.8 F (37.1 C)-100.1 F (37.8 C)] 99.3 F (37.4 C) (12/03 0807) Pulse Rate:  [83-92] 92 (12/03 0807) Resp:  [16-18] 16 (12/03 0807) BP: (103-132)/(56-67) 118/67 (12/03 0807) SpO2:  [97 %-100 %] 99 % (12/03 0807)     Height: 5' 3 (160 cm) Weight: 83.9 kg BMI (Calculated): 32.78   Intake/Output last 2 shifts:  12/02 0701 - 12/03 0700 In: 1360 [P.O.:1360] Out: 1310 [Urine:1200; Drains:110]   Physical Exam:  Constitutional: alert, cooperative and no distress  Respiratory: breathing non-labored at rest  Cardiovascular: regular rate and sinus rhythm  Gastrointestinal: soft, mildly tender, and non-distended, surgical incisions are clean and dry, moderate serosanguinous output in drain  Labs:     Latest Ref Rng & Units 05/10/2024    4:06 AM 05/09/2024    3:49 AM 05/08/2024    6:05 PM  CBC  WBC 4.0 - 10.5 K/uL 6.3  6.9  10.0   Hemoglobin 12.0 - 15.0 g/dL 8.9  8.4  8.9   Hematocrit 36.0 - 46.0 % 27.5  26.1  27.7   Platelets 150 - 400 K/uL 175  194  206       Latest Ref Rng & Units 05/10/2024    4:06 AM 05/09/2024    3:49 AM 05/08/2024    6:05 PM  CMP  Glucose 70 - 99 mg/dL 891  893    BUN 8 - 23 mg/dL 12  16    Creatinine 9.55 - 1.00 mg/dL 8.86  8.67  8.64   Sodium 135 - 145 mmol/L 143  145    Potassium 3.5 - 5.1 mmol/L 3.2  3.3    Chloride 98 - 111 mmol/L 109  110    CO2 22 - 32 mmol/L 23  22    Calcium  8.9 - 10.3 mg/dL 8.9  8.7      Imaging studies: No new pertinent imaging  studies   Assessment/Plan:  73 y.o. female with colon mass 2 Days Post-Op s/p robotic assisted laparoscopic transverse colectomy, complicated by pertinent comorbidities including CAD status post CABG x 1, aortic stenosis, CKD stage III, hypertension, hyperlipidemia, diabetes type 2, anemia, and history of tobacco use.    - Clinical presentation still reassuring. Abdominal discomfort is minimal, not requiring pain medication. Stable vital signs, no leukocytosis, and stable Hbg levels.  - Monitoring for return of GI function. Added Gas-X. Continue clear liquid diet   - Output in JP drain appears more serosanguinous today  - Monitor potassium levels - Continue IV Zosyn and pain management    -- Paras Kreider Barrientos PA-C

## 2024-05-10 NOTE — Progress Notes (Signed)
 Mobility Specialist Progress Note:    05/10/24 1740  Mobility  Activity Ambulated with assistance  Level of Assistance Standby assist, set-up cues, supervision of patient - no hands on  Assistive Device  (IV pole)  Distance Ambulated (ft) 160 ft  Range of Motion/Exercises Active;All extremities  Activity Response Tolerated well  Mobility visit 1 Mobility  Mobility Specialist Start Time (ACUTE ONLY) 1715  Mobility Specialist Stop Time (ACUTE ONLY) 1740  Mobility Specialist Time Calculation (min) (ACUTE ONLY) 25 min   Pt received in bed, agreeable to mobility. Required CGA to stand and SBA to ambulate while utilizing IV pole for stability. Tolerated well, asx throughout. Returned to room, left pt sitting EOB for dinner. All needs met.  Sherrilee Ditty Mobility Specialist Please contact via Special Educational Needs Teacher or  Rehab office at [phone]

## 2024-05-11 LAB — BASIC METABOLIC PANEL WITH GFR
Anion gap: 10 (ref 5–15)
BUN: 10 mg/dL (ref 8–23)
CO2: 23 mmol/L (ref 22–32)
Calcium: 8.9 mg/dL (ref 8.9–10.3)
Chloride: 107 mmol/L (ref 98–111)
Creatinine, Ser: 1.06 mg/dL — ABNORMAL HIGH (ref 0.44–1.00)
GFR, Estimated: 55 mL/min — ABNORMAL LOW (ref >60)
Glucose, Bld: 112 mg/dL — ABNORMAL HIGH (ref 70–99)
Potassium: 3.6 mmol/L (ref 3.5–5.1)
Sodium: 140 mmol/L (ref 135–145)

## 2024-05-11 NOTE — TOC CM/SW Note (Signed)
 Transition of Care [facility]) - Inpatient Brief Assessment   Patient Details  Name: HALIMA FOGAL MRN: [id number] Date of Birth: 04-12-1951  Transition of Care [facility]) CM/SW Contact:    Corean ONEIDA Haddock, RN Phone Number: 05/11/2024, 9:28 AM   Clinical Narrative:    Transition of Care [facility]) Screening Note   Patient Details  Name: TAYLOR SPILDE Date of Birth: 1950-12-06   Transition of Care [facility]) CM/SW Contact:    Corean ONEIDA Haddock, RN Phone Number: 05/11/2024, 9:28 AM    Transition of Care Department [facility]) has reviewed patient and no TOC needs have been identified at this time.  If new patient transition needs arise, please place a TOC consult.   Transition of Care Asessment: Insurance and Status: Insurance coverage has been reviewed Patient has primary care physician: Yes     Prior/Current Home Services: No current home services Social Drivers of Health Review: SDOH reviewed no interventions necessary Readmission risk has been reviewed: Yes Transition of care needs: no transition of care needs at this time

## 2024-05-11 NOTE — Plan of Care (Signed)
   Problem: Education: Goal: Knowledge of General Education information will improve Description Including pain rating scale, medication(s)/side effects and non-pharmacologic comfort measures Outcome: Progressing

## 2024-05-11 NOTE — Progress Notes (Signed)
 [facility]- General Surgery  SURGICAL PROGRESS NOTE  Hospital Day(s): 3.   Post op day(s): 3 Days Post-Op.   Interval History:  Doing well this morning. Has had multiple bowel movements. Reports stools have been loose. Still on clear liquid diet and tolerating it well. Denies any nausea or vomiting. Abdominal discomfort has remained minimal.   Vital signs in last 24 hours: [min-max] current  Temp:  [98.9 F (37.2 C)-99.5 F (37.5 C)] 98.9 F (37.2 C) (12/04 0733) Pulse Rate:  [86-89] 86 (12/04 0733) Resp:  [16-20] 20 (12/04 0537) BP: (104-123)/(58-74) 106/74 (12/04 0733) SpO2:  [98 %-100 %] 100 % (12/04 0733)     Height: 5' 3 (160 cm) Weight: 83.9 kg BMI (Calculated): 32.78   Intake/Output last 2 shifts:  12/03 0701 - 12/04 0700 In: 280 [P.O.:280] Out: 160 [Drains:160]   Physical Exam:  Constitutional: alert, cooperative and no distress  Respiratory: breathing non-labored at rest  Cardiovascular: regular rate and sinus rhythm  Gastrointestinal: soft, mildly tender, and non-distended, output in JP drain is serosanguinous, getting clear and less thick. Surgical incisions are clean and dry.   Labs:     Latest Ref Rng & Units 05/10/2024    4:06 AM 05/09/2024    3:49 AM 05/08/2024    6:05 PM  CBC  WBC 4.0 - 10.5 K/uL 6.3  6.9  10.0   Hemoglobin 12.0 - 15.0 g/dL 8.9  8.4  8.9   Hematocrit 36.0 - 46.0 % 27.5  26.1  27.7   Platelets 150 - 400 K/uL 175  194  206       Latest Ref Rng & Units 05/11/2024    4:40 AM 05/10/2024    4:06 AM 05/09/2024    3:49 AM  CMP  Glucose 70 - 99 mg/dL 887  891  893   BUN 8 - 23 mg/dL 10  12  16    Creatinine 0.44 - 1.00 mg/dL 8.93  8.86  8.67   Sodium 135 - 145 mmol/L 140  143  145   Potassium 3.5 - 5.1 mmol/L 3.6  3.2  3.3   Chloride 98 - 111 mmol/L 107  109  110   CO2 22 - 32 mmol/L 23  23  22    Calcium  8.9 - 10.3 mg/dL 8.9  8.9  8.7     Imaging studies: No new pertinent imaging studies   Assessment/Plan:  73 y.o. female with  colon mass 3 Days Post-Op s/p robotic assisted laparoscopic transverse colectomy, complicated by pertinent comorbidities including CAD status post CABG x 1, aortic stenosis, CKD stage III, hypertension, hyperlipidemia, diabetes type 2, anemia, and history of tobacco use.    - Stable vital signs, no fever and not tachycardic. Has had return of GI function, abdominal discomfort is still minimal and still tolerating clear liquids. Plan to advance to full liquid diet today.   - Continue to monitor JP output- more clear and less thick    - Continue IV Zosyn and pain management    - Encourage to continue ambulate     -- Espen Bethel Barrientos PA-C

## 2024-05-12 ENCOUNTER — Other Ambulatory Visit: Payer: Self-pay

## 2024-05-12 LAB — SURGICAL PATHOLOGY

## 2024-05-12 MED ORDER — HYDROCODONE-ACETAMINOPHEN 5-325 MG PO TABS
1.0000 | ORAL_TABLET | Freq: Three times a day (TID) | ORAL | 0 refills | Status: AC | PRN
  Filled 2024-05-12: qty 10, 4d supply, fill #0

## 2024-05-12 NOTE — Discharge Instructions (Addendum)
  Diet: Resume home heart healthy regular diet.   Activity: No heavy lifting >20 pounds (children, pets, laundry, garbage) or strenuous activity until follow-up, but light activity and walking are encouraged. Do not drive or drink alcohol  if taking narcotic pain medications.  Wound care: Keep paper tape and gauze in place. May shower in 48 hrs with soapy water and pat dry (do not rub incisions), but no baths or submerging incision underwater until follow-up. (no swimming)   Medications: Resume all home medications. For mild to moderate pain: acetaminophen  (Tylenol ) or ibuprofen (if no kidney disease). Combining Tylenol  with alcohol  can substantially increase your risk of causing liver disease. Narcotic pain medications, if prescribed, can be used for severe pain, though may cause nausea, constipation, and drowsiness. Do not combine Tylenol  and Norco within a 6 hour period as Norco contains Tylenol . If you do not need the narcotic pain medication, you do not need to fill the prescription.  Call office [phone]) at any time if any questions, worsening pain, fevers/chills, bleeding, drainage from incision site, or other concerns.

## 2024-05-12 NOTE — Plan of Care (Signed)
  Problem: Education: Goal: Understanding of discharge needs will improve Outcome: Progressing Goal: Verbalization of understanding of the causes of altered bowel function will improve Outcome: Progressing   Problem: Activity: Goal: Ability to tolerate increased activity will improve Outcome: Progressing   Problem: Bowel/Gastric: Goal: Gastrointestinal status for postoperative course will improve Outcome: Progressing   Problem: Health Behavior/Discharge Planning: Goal: Identification of community resources to assist with postoperative recovery needs will improve Outcome: Progressing   Problem: Nutritional: Goal: Will attain and maintain optimal nutritional status will improve Outcome: Progressing   Problem: Clinical Measurements: Goal: Postoperative complications will be avoided or minimized Outcome: Progressing   Problem: Respiratory: Goal: Respiratory status will improve Outcome: Progressing   Problem: Skin Integrity: Goal: Will show signs of wound healing Outcome: Progressing   Problem: Health Behavior/Discharge Planning: Goal: Ability to manage health-related needs will improve Outcome: Progressing   Problem: Clinical Measurements: Goal: Ability to maintain clinical measurements within normal limits will improve Outcome: Progressing Goal: Will remain free from infection Outcome: Progressing Goal: Diagnostic test results will improve Outcome: Progressing Goal: Respiratory complications will improve Outcome: Progressing Goal: Cardiovascular complication will be avoided Outcome: Progressing   Problem: Coping: Goal: Level of anxiety will decrease Outcome: Progressing   Problem: Nutrition: Goal: Adequate nutrition will be maintained Outcome: Progressing   Problem: Activity: Goal: Risk for activity intolerance will decrease Outcome: Progressing   Problem: Skin Integrity: Goal: Risk for impaired skin integrity will decrease Outcome: Progressing    Problem: Safety: Goal: Ability to remain free from injury will improve Outcome: Progressing

## 2024-05-12 NOTE — Plan of Care (Signed)
 IV's removed, Rx delivered to patients room, JP drain removed by Dr. Tye, patient given bandages to manage site in case of leaking, discharge instructions reviewed and patient discharged to home with sister.

## 2024-05-12 NOTE — Discharge Summary (Signed)
 [facility]-General Surgery  SURGICAL DISCHARGE SUMMARY  Patient ID: Renee [name] MRN: [id number] DOB/AGE: January 23, 1951 73 y.o.  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) 07/29/2022   Overweight (BMI 25.0-29.9) 07/27/2022   Melena 07/26/2022   Stage 3a chronic kidney disease (CKD) (HCC) 07/26/2022   Aortic stenosis 05/04/2022   Spinal stenosis, lumbar region, with neurogenic claudication 05/08/2021   Lumbar spondylosis 05/08/2021   Lumbar degenerative disc disease 05/08/2021   Onychomycosis of multiple toenails with type 2 diabetes mellitus (HCC) 04/20/2019   Status post total knee replacement using cement, left 10/25/2018   Chronic midline low back pain without sciatica 02/28/2018   Essential hypertension 02/02/2017   Primary osteoarthritis of left knee 10/28/2016   CAD in native artery 07/28/2016   Pure hypercholesterolemia 07/28/2016   Type 2 diabetes mellitus with diabetic polyneuropathy, without long-term current use of insulin  (HCC) 07/28/2016   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    Hospital Course:  Patient was scheduled for robotic assisted laparoscopic transverse colectomy for colon mass. She was taken to the operating room on 05/08/2024. Surgery went well. Patient tolerated procedure.  Patient was admitted for pain management and close monitoring for return of GI function. Her diet was slowly advance to regular diet.  Patient is status post-op Day 4  tolerating regular diet and ambulating with minimal discomfort. Has had multiple bowel movements since s/p Day 3. Surgical incisions show no signs of infection. JP drain had minimal serosanguineous output. Patient is clear from surgical standpoint.  Patient will follow-up outpatient with Dr. Tye in 2 weeks.     Physical Examination:  Constitutional: alert, in no acute distress Pulmonary: CTA bilaterally, normal breath sounds Cardiac: regular rate and rhythm Gastrointestinal: soft, non-tender, and non-distended Skin: abdominal incisions look clean, dry, and intact with surgical glue in place, drain was removed this morning, paper-tape and gauze was placed over drain site.    Allergies as of 05/12/2024       Reactions   Tizanidine     headaches   Venlafaxine Other (See Comments)   intolerant   Codeine Nausea And Vomiting   Tramadol Nausea And Vomiting        Medication List     TAKE these medications    acetaminophen  500 MG tablet Commonly known as: TYLENOL  Take 500 mg by mouth every 6 (six) hours as needed for headache or fever.   Alpha-Lipoic Acid 600 MG Caps Take 600 mg by mouth daily.   amLODipine  5 MG tablet Commonly known as: NORVASC  Take 5 mg by mouth every morning.   amoxicillin  500 MG tablet Commonly known as: AMOXIL  Take 4 tablets (2,000 mg total) by mouth as directed. 1 hour prior to dental work including cleanings   aspirin  EC 81 MG tablet Take 81 mg by mouth every other day.   carvedilol  6.25 MG tablet Commonly known as: COREG  Take 6.25 mg by mouth 2 (two) times a day.   CINNAMON  PO Take 1,000 mg by mouth daily.   clopidogrel  75 MG tablet Commonly known  as: PLAVIX  Take 1 tablet (75 mg total) by mouth daily at 6 (six) AM.   EPINEPHrine  0.3 mg/0.3 mL Soaj injection Commonly known as: EPI-PEN Inject 0.3 mg into the muscle as needed for anaphylaxis.   Fish Oil 1000 MG Caps Take 1,000 mg by mouth 3 (three) times daily.   furosemide  20 MG  tablet Commonly known as: LASIX  Take 20 mg by mouth once a week. In the morning   gabapentin  800 MG tablet Commonly known as: NEURONTIN  Take 800-1,600 mg by mouth See admin instructions. Take 1 tablet (800 mg) by mouth in the morning, 1 tablet (800 mg) by mouth at noon, & take 2 tablets (1600 mg) by mouth at bedtime.   HYDROcodone -acetaminophen  5-325 MG tablet Commonly known as: NORCO/VICODIN Take 1 tablet by mouth every 8 (eight) hours as needed for up to 3 days.   loratadine  10 MG tablet Commonly known as: CLARITIN  Take 10 mg by mouth daily as needed for allergies.   Magnesium  250 MG Caps Take 250 mg by mouth daily.   nitroGLYCERIN  0.4 MG SL tablet Commonly known as: NITROSTAT  Place 0.4 mg under the tongue every 5 (five) minutes as needed for chest pain.   pantoprazole  40 MG tablet Commonly known as: PROTONIX  Take 40 mg by mouth 2 (two) times daily.   potassium chloride  SA 20 MEQ tablet Commonly known as: KLOR-CON  M Take 20 mEq by mouth daily.   rosuvastatin  40 MG tablet Commonly known as: CRESTOR  Take 40 mg by mouth at bedtime.   Vitamin D3 50 MCG (2000 UT) Tabs Take 2,000 Units by mouth daily.          Follow-up Information     [place], Isami, DO. Go on 05/22/2024.   Specialties: General Surgery, Surgery Why: 2 weeks follow-up for transverse colectomy. Go at 2:15pm. Contact information: [address] [place] [address] [phone]                  Time spent on discharge management including discussion of hospital course, clinical condition, outpatient instructions, prescriptions, and follow up with the patient and members of the medical team: >30 minutes  Dedra Matsuo Barrientos PA-C

## 2024-05-29 ENCOUNTER — Other Ambulatory Visit (HOSPITAL_COMMUNITY): Payer: Self-pay

## 2024-06-09 ENCOUNTER — Other Ambulatory Visit: Payer: Self-pay

## 2024-06-09 ENCOUNTER — Ambulatory Visit
Admission: RE | Admit: 2024-06-09 | Discharge: 2024-06-09 | Disposition: A | Discharge status: Home / Self Care | Source: Ambulatory Visit

## 2024-06-09 DIAGNOSIS — M25571 Pain in right ankle and joints of right foot: Secondary | ICD-10-CM

## 2024-06-09 DIAGNOSIS — M79671 Pain in right foot: Secondary | ICD-10-CM

## 2024-06-13 ENCOUNTER — Ambulatory Visit

## 2024-06-13 ENCOUNTER — Ambulatory Visit (INDEPENDENT_AMBULATORY_CARE_PROVIDER_SITE_OTHER)

## 2024-06-13 VITALS — BP 141/82 | HR 84 | Ht 63.0 in | Wt 179.4 lb

## 2024-06-13 DIAGNOSIS — M79671 Pain in right foot: Secondary | ICD-10-CM

## 2024-06-13 DIAGNOSIS — S92354A Nondisplaced fracture of fifth metatarsal bone, right foot, initial encounter for closed fracture: Secondary | ICD-10-CM

## 2024-06-13 NOTE — Progress Notes (Unsigned)
 "  Office Visit Note   Patient: Renee [name]           Date of Birth: 11/11/1950           MRN: [id number] Visit Date: 06/13/2024              Requested by: [name], MD [address] [facility] [address] [place],  [place] [address] PCP: [name], MD   Assessment & Plan: Visit Diagnoses:  1. Closed nondisplaced fracture of fifth metatarsal bone of right foot, initial encounter   2. Right foot pain     Plan: Natural history and expected course discussed. Questions answered. This fracture is minimally displaced and amenable to nonoperative treatment. Patient placed in a walking boot and may bear weight as tolerated in the boot; will follow up in 4 weeks for reevaluation.  Orders:  Orders Placed This Encounter  Procedures   DG Foot Complete Right     Subjective: Chief Complaint: Right foot pain  HPI Patient is a 74 y.o. year old female who presents with a foot injury  involving the  right foot. Onset of the symptoms was 2 weeks ago. Inciting event: injured during a fall while going down the steps. Current symptoms include pain located on the side of the right foot. Pain is aggravated by any weight bearing.  Treatment to date: none.  Objective: Vital Signs: BP (!) 141/82 (Cuff Size: Normal)   Pulse 84   Ht 5' 3 (1.6 m)   Wt 179 lb 6.4 oz (81.4 kg)   BMI 31.78 kg/m   Physical Exam Gen: Alert, No Acute Distress right foot: Skin intact, no erythema or induration noted. There is TTP and swelling and the base of the 5th metatarsal of the right foot. 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) 07/29/2022   Overweight (BMI 25.0-29.9) 07/27/2022   Melena 07/26/2022   Stage 3a chronic kidney disease (CKD) (HCC) 07/26/2022   Aortic stenosis 05/04/2022   Spinal stenosis, lumbar region, with neurogenic claudication 05/08/2021   Lumbar spondylosis 05/08/2021   Lumbar degenerative disc disease 05/08/2021   Onychomycosis of multiple toenails with type 2 diabetes mellitus (HCC) 04/20/2019   Status post total knee replacement using cement, left 10/25/2018   Chronic midline low back pain without sciatica 02/28/2018   Essential hypertension 02/02/2017   Primary osteoarthritis of left knee 10/28/2016   CAD in native artery 07/28/2016   Pure hypercholesterolemia 07/28/2016   Type 2 diabetes mellitus with diabetic polyneuropathy, without long-term current use of insulin  (HCC) 07/28/2016   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 Wonda & Dr. Lucas   Severe aortic stenosis     Family History  Problem Relation Age of Onset   Breast cancer Neg Hx     Past Surgical History:  Procedure Laterality Date   ABDOMINAL HYSTERECTOMY     BREAST EXCISIONAL BIOPSY Right 20+ yrs ago   benign   CARDIAC SURGERY     cabg   CARPAL TUNNEL RELEASE Bilateral    COLONOSCOPY N/A 02/24/2017   Procedure: COLONOSCOPY;  Surgeon: Toledo,  [name], MD;  Location: [facility] ENDOSCOPY;  Service: Endoscopy;  Laterality: N/A;   COLONOSCOPY N/A 07/28/2022   Procedure: COLONOSCOPY;  Surgeon: Toledo, [name], MD;  Location: [facility] ENDOSCOPY;  Service: Gastroenterology;  Laterality: N/A;   COLONOSCOPY N/A 03/21/2024   Procedure: COLONOSCOPY;  Surgeon: Therisa Bi, MD;  Location: [facility] ENDOSCOPY;  Service: Gastroenterology;  Laterality: N/A;  DM   Plavix    CORONARY ARTERY BYPASS GRAFT     1996   CORONARY/GRAFT ANGIOGRAPHY N/A 09/15/2022   Procedure: CORONARY/GRAFT ANGIOGRAPHY;  Surgeon: [name], MD;  Location: [facility] INVASIVE CV LAB;  Service: Cardiovascular;  Laterality: N/A;   ENDARTERECTOMY Right 05/14/2023   Procedure: ENDARTERECTOMY CAROTID;  Surgeon: [name], MD;  Location: [facility] ORS;  Service: Vascular;  Laterality: Right;   ESOPHAGOGASTRODUODENOSCOPY N/A 03/21/2024   Procedure: EGD (ESOPHAGOGASTRODUODENOSCOPY);  Surgeon: Therisa Bi, MD;  Location: [facility] ENDOSCOPY;  Service: Gastroenterology;  Laterality: N/A;   ESOPHAGOGASTRODUODENOSCOPY (EGD) WITH PROPOFOL  N/A 07/27/2022   Procedure: ESOPHAGOGASTRODUODENOSCOPY (EGD) WITH PROPOFOL ;  Surgeon: Toledo, [name], MD;  Location: [facility] ENDOSCOPY;  Service: Gastroenterology;  Laterality: N/A;   HERNIA REPAIR     umbilical   INTRAOPERATIVE TRANSTHORACIC ECHOCARDIOGRAM N/A 10/13/2022   Procedure: INTRAOPERATIVE TRANSTHORACIC ECHOCARDIOGRAM;  Surgeon: [name], MD;  Location: [facility] INVASIVE CV LAB;  Service: Open Heart Surgery;  Laterality: N/A;   LUMBAR LAMINECTOMY/DECOMPRESSION MICRODISCECTOMY N/A 06/25/2021   Procedure: L2-3 DECOMPRESSION;  Surgeon: [name], MD;  Location: [facility] ORS;  Service: Neurosurgery;  Laterality: N/A;   TOTAL KNEE ARTHROPLASTY Left 10/25/2018   Procedure: TOTAL KNEE ARTHROPLASTY - LEFT - DIABETIC;  Surgeon: [name], MD;  Location: [facility] ORS;  Service: Orthopedics;  Laterality: Left;   TRANSCATHETER AORTIC VALVE REPLACEMENT, TRANSFEMORAL Right  10/13/2022   Procedure: Transcatheter Aortic Valve Replacement, Transfemoral;  Surgeon: [name], MD;  Location: [facility] INVASIVE CV LAB;  Service: Open Heart Surgery;  Laterality: Right;   Social History   Occupational History   Not on file  Tobacco Use   Smoking status: Former    Current packs/day: 0.25    Average packs/day: 0.3 packs/day for 35.0 years (8.8 ttl pk-yrs)    Types: Cigarettes   Smokeless tobacco: Never  Vaping Use   Vaping status: Never Used  Substance and Sexual Activity   Alcohol  use: Yes    Comment: rare   Drug use: No   Sexual activity: Not on file   Current Outpatient Medications  Medication Instructions   acetaminophen  (TYLENOL ) 500 mg, Oral, Every 6 hours PRN   Alpha-Lipoic Acid 600 mg, Daily   amLODipine  (NORVASC ) 5 mg, Oral, BH-each morning   amoxicillin  (AMOXIL ) 2,000 mg, Oral, As directed, 1 hour prior to  dental work including cleanings   aspirin  EC 81 mg, Every other day   carvedilol  (COREG ) 6.25 mg, 2 times daily   CINNAMON  PO 1,000 mg, Daily   clopidogrel  (PLAVIX ) 75 mg, Oral, Daily   EPINEPHrine  (EPI-PEN) 0.3 mg, As needed   Fish Oil 1,000 mg, 3 times daily   furosemide  (LASIX ) 20 mg, Weekly   gabapentin  (NEURONTIN ) 800-1,600 mg, See admin instructions   loratadine  (CLARITIN ) 10 mg, Daily PRN   Magnesium  250 mg, Oral, Daily   nitroGLYCERIN  (NITROSTAT ) 0.4 mg, Every 5 min PRN   pantoprazole  (PROTONIX ) 40 mg, Oral, 2 times daily   potassium chloride  SA (KLOR-CON  M) 20 MEQ tablet 20 mEq, Daily   rosuvastatin  (CRESTOR ) 40 mg, Daily at bedtime   Vitamin D3 2,000 Units, Daily   Allergies as of 06/13/2024 - Review Complete 05/08/2024  Allergen Reaction Noted   Tizanidine   10/08/2022   Venlafaxine Other (See Comments) 07/02/2020   Codeine Nausea And Vomiting 05/23/2016   Tramadol Nausea And Vomiting 05/23/2016   "

## 2024-06-13 NOTE — Progress Notes (Signed)
 Rheumatology Follow Up Note  Chief Complaint  Patient presents with   Polyarthralgia      Subjective:Arthritis Pertinent negatives include no diarrhea, fatigue or rash.   Renee [name] is a 73 y.o. female is here today for follow up of joint pains. The patient's allergies, current medications, past family history, past medical history, past social history, past surgical history and problem list were reviewed and updated as appropriate.   She is followed by physiatry regarding her lumbar stenosis with radiculitis.  She has control of her bowels and bladder.   She did not tolerate Ultracet. She has pains of joints all over, shoulders, groin. She has pain with walking. She denies any swelling of the joints. She is able to form a fist. She has no history of psoriasis. There is no family history of psoriasis.   She is taking the gabapentin  for pains but is not helping. She is concerned that her cholesterol medication is causing her aches and pains.   Review of Systems:   Review of Systems  Constitutional:  Negative for fatigue.  HENT:  Negative for mouth sores and trouble swallowing.        Neg: Dry Mouth  Eyes:  Negative for redness.       Dry Eyes  Respiratory:  Negative for cough and shortness of breath.   Cardiovascular:  Negative for chest pain and leg swelling.  Gastrointestinal:  Negative for constipation, diarrhea and nausea.  Endocrine: Negative for cold intolerance and heat intolerance.  Genitourinary:  Negative for hematuria.  Musculoskeletal:  Positive for arthritis.       Per HPI  Skin:  Negative for color change and rash.  Neurological:  Positive for numbness. Negative for dizziness, weakness and headaches.       Muscle spasm  Hematological:  Does not bruise/bleed easily.  Psychiatric/Behavioral:  Negative for dysphoric mood and sleep disturbance. The patient is not nervous/anxious.   All other systems reviewed and are negative.  Objective:  Vitals:   06/13/24 0805   BP: 113/70  Pulse: 73  Temp: 36.3 C (97.4 F)  TempSrc: Temporal  Weight: 79.8 kg (176 lb)  Height: 160 cm (5' 3)  PainSc:   7   Length of Stiffness: 20-30 minutes   GEN - Pleasant, No Apparent Distress  HEENT - normocephalic and atraumatic. Conjunctiva Clear.   Neck - supple with no adenopathy or thyromegaly.   C spine with full range of motion. Heart - regular rate and rhythm, No gallops/rub, Nml S1S2; Systolic Ejection Murmure Right 2nd ICS Lungs - clear to auscultation in all fields. Extremities - there is no cyanosis or edema. Neurological - alert and oriented.  Spine - no paraspinal tenderness; Lumbar spine pain Skin - Dry Peeling Skin of the Palm MSK - The following joints were examined bilaterally: Hands, Wrists, Elbows, Shoulders, Metatarsals, Ankels, Knees and Hips; they were normal apart from what is noted.    100% Fist Formation DIP and PIP Enlargement  Both Biceps and Shoulders with Tenderness  Left Shoulder with stiffness  Right Knee Medial Compartment Tenderness  Left Knee Replaced Right Hip with limited motion No Synovitis or Dactylitis No T[place] Gait Using a Cane  ______________________________________________________________________ Labs/Imaging Reviewed in EMR Cr 1.06, AST 17, ALA 11 CBC with Anemia; Hgb 8.9, Hct 27.5, Plt 175  Vitamin B12 348, Folate 5.9 Vitamin D  45.3 HgA1C 6.1 SPEP: No M Spike UA : No blood or protein Esr 54 Crp 5 Neg: ANA, RA, AntiCCP, SSA, SSB, Anti-CarP,  Anti-CEP-1, Anti-Sa, Anti-MCV  Neg: Hep C   Mammogram screening: No mammographic evidence of malignancy.   Hand Xray (2025): Normal Xray    C spine xray: 1. The cervical spine is only well assessed to the bottom of C6. C7 and T1 are not well visualized. No acute traumatic abnormalities identified on this study.    CT Lumbar Spine (09/2023) 1. No change in the appearance of the lumbar spine since the prior MRI.  2. Severe central canal stenosis at L2-3 due to  disc and facet disease. Mild-to-moderate bilateral foraminal narrowing at this level is worse on the right.  3. Mild spinal stenosis at L3-4 with a right paracentral protrusion narrowing the right subarticular recess.  4. Mild spinal stenosis at L1-2 due to disc and prominent dorsal epidural fat.  5. Aortic atherosclerosis.    Right Knee (08/2023): Complete loss of medial joint space, significant erosion of medial tibial bone and distal femur with extensive spurring, lateral compartment degenerative changes with large spurs, notches in the spines, large posterior osteophyte, extensive patellofemoral arthritis, large medial facet spurs on patella and trochlea.   DEXA Scan (2017): normal    Assessment and Plan   Diagnoses and all orders for this visit:  Primary osteoarthritis involving multiple joints -     hydroxychloroquine (PLAQUENIL) 200 mg tablet; Take 1 tablet (200 mg total) by mouth 2 (two) times daily  Lumbar spondylosis  Chronic right hip pain -     X-ray hip right 2 or 3 views with or without pelvis; Future  Encounter for long-term (current) use of high-risk medication  Other orders -     Cancel: Follow up in Rheumatology; Future -     Follow up in Rheumatology; Future    -- Her evaluation for inflammatory arthritis was negative in the past. Her evaluation for connective tissue disease is also negative. She is followed by neurology regarding neuropathy.  -- Did not tolerate Tramadol, Tylenol #3, Ultracet -- Failed Cymbalta  -- Unable to use NSAIDs due to being on Plavix  -- Recommend Tylenol  three times a day not to exceed 3 g/day  -- Trial of Plaquenil 200 mg twice daily  -- Hydroxychloroquine (Plaquenil) is an immunosuppressive medication that require monitoring for eye toxicity.  -- Labs Reviewed   Follow up with Mayur Loree Blanch, MD in about 3 months (around 09/11/2024).   All new prescription medications, changes in current prescription dosages, and sample  medications were discussed with the patient, including patient education, medication name, use, dosage, potential side effects, drug interactions, consequences of not using/taking, and special instructions.  Patient expressed understanding.  No barriers to adherence.   I appreciate the opportunity to participate in the care of Renee [name]. Please do not hesitate to contact me with any questions or concerns that may arise in regards to the patient's rheumatologic disease.   I personally performed the service. (TP)  MAYUR LOREE BLANCH, MD

## 2024-06-25 NOTE — Progress Notes (Unsigned)
 "                         MRN : [id number]  Renee [name] is a 74 y.o. (June 03, 1951) female who presents with chief complaint of check carotid arteries.  History of Present Illness:   The patient is seen for follow up evaluation of carotid stenosis status post right carotid endarterectomy on 05/14/2023.  There were no post operative problems or complications related to the surgery.  The patient denies neck or incisional pain.   The patient denies interval amaurosis fugax. There is no recent history of TIA symptoms or focal motor deficits. There is no prior documented CVA.   The patient denies headache.   The patient is taking enteric-coated aspirin  81 mg daily.   No recent shortening of the patient's walking distance or new symptoms consistent with claudication.  No history of rest pain symptoms. No new ulcers or wounds of the lower extremities have occurred.   There is no history of DVT, PE or superficial thrombophlebitis. No recent episodes of angina or shortness of breath documented.    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 Wonda & Dr. Lucas   Severe aortic stenosis     Past Surgical History:  Procedure Laterality Date   ABDOMINAL HYSTERECTOMY     BREAST EXCISIONAL BIOPSY Right 20+ yrs ago   benign   CARDIAC SURGERY     cabg   CARPAL TUNNEL RELEASE Bilateral    COLONOSCOPY N/A 02/24/2017    Procedure: COLONOSCOPY;  Surgeon: Toledo, [name], MD;  Location: [facility] ENDOSCOPY;  Service: Endoscopy;  Laterality: N/A;   COLONOSCOPY N/A 07/28/2022   Procedure: COLONOSCOPY;  Surgeon: Toledo, [name], MD;  Location: [facility] ENDOSCOPY;  Service: Gastroenterology;  Laterality: N/A;   COLONOSCOPY N/A 03/21/2024   Procedure: COLONOSCOPY;  Surgeon: Therisa Bi, MD;  Location: [facility] ENDOSCOPY;  Service: Gastroenterology;  Laterality: N/A;  DM   Plavix    CORONARY ARTERY BYPASS GRAFT     1996   CORONARY/GRAFT ANGIOGRAPHY N/A 09/15/2022   Procedure: CORONARY/GRAFT ANGIOGRAPHY;  Surgeon: [name], MD;  Location: [facility] INVASIVE CV LAB;  Service: Cardiovascular;  Laterality: N/A;   ENDARTERECTOMY Right 05/14/2023   Procedure: ENDARTERECTOMY CAROTID;  Surgeon: [name], MD;  Location: [facility] ORS;  Service: Vascular;  Laterality: Right;   ESOPHAGOGASTRODUODENOSCOPY N/A 03/21/2024   Procedure: EGD (ESOPHAGOGASTRODUODENOSCOPY);  Surgeon: Therisa Bi, MD;  Location: [facility] ENDOSCOPY;  Service: Gastroenterology;  Laterality: N/A;   ESOPHAGOGASTRODUODENOSCOPY (EGD) WITH PROPOFOL  N/A 07/27/2022   Procedure: ESOPHAGOGASTRODUODENOSCOPY (EGD) WITH PROPOFOL ;  Surgeon: Toledo, [name], MD;  Location: [facility] ENDOSCOPY;  Service: Gastroenterology;  Laterality: N/A;   HERNIA REPAIR     umbilical   INTRAOPERATIVE TRANSTHORACIC ECHOCARDIOGRAM N/A 10/13/2022  Procedure: INTRAOPERATIVE TRANSTHORACIC ECHOCARDIOGRAM;  Surgeon: [name], MD;  Location: [facility] INVASIVE CV LAB;  Service: Open Heart Surgery;  Laterality: N/A;   LUMBAR LAMINECTOMY/DECOMPRESSION MICRODISCECTOMY N/A 06/25/2021   Procedure: L2-3 DECOMPRESSION;  Surgeon: [name], MD;  Location: [facility] ORS;  Service: Neurosurgery;  Laterality: N/A;   TOTAL KNEE ARTHROPLASTY Left 10/25/2018   Procedure: TOTAL KNEE ARTHROPLASTY - LEFT - DIABETIC;  Surgeon: [name], MD;  Location: [facility] ORS;  Service: Orthopedics;  Laterality: Left;   TRANSCATHETER  AORTIC VALVE REPLACEMENT, TRANSFEMORAL Right 10/13/2022   Procedure: Transcatheter Aortic Valve Replacement, Transfemoral;  Surgeon: [name], MD;  Location: [facility] INVASIVE CV LAB;  Service: Open Heart Surgery;  Laterality: Right;    Social History Social History[2]  Family History Family History  Problem Relation Age of Onset   Breast cancer Neg Hx     Allergies[3]   REVIEW OF SYSTEMS (Negative unless checked)  Constitutional: [] Weight loss  [] Fever  [] Chills Cardiac: [] Chest pain   [] Chest pressure   [] Palpitations   [] Shortness of breath when laying flat   [] Shortness of breath with exertion. Vascular:  [x] Pain in legs with walking   [] Pain in legs at rest  [] History of DVT   [] Phlebitis   [] Swelling in legs   [] Varicose veins   [] Non-healing ulcers Pulmonary:   [] Uses home oxygen   [] Productive cough   [] Hemoptysis   [] Wheeze  [] COPD   [] Asthma Neurologic:  [] Dizziness   [] Seizures   [] History of stroke   [] History of TIA  [] Aphasia   [] Vissual changes   [] Weakness or numbness in arm   [] Weakness or numbness in leg Musculoskeletal:   [] Joint swelling   [] Joint pain   [] Low back pain Hematologic:  [] Easy bruising  [] Easy bleeding   [] Hypercoagulable state   [] Anemic Gastrointestinal:  [] Diarrhea   [] Vomiting  [] Gastroesophageal reflux/heartburn   [] Difficulty swallowing. Genitourinary:  [] Chronic kidney disease   [] Difficult urination  [] Frequent urination   [] Blood in urine Skin:  [] Rashes   [] Ulcers  Psychological:  [] History of anxiety   []  History of major depression.  Physical Examination  There were no vitals filed for this visit. There is no height or weight on file to calculate BMI. Gen: WD/WN, NAD Head: [place]/AT, No temporalis wasting.  Ear/Nose/Throat: Hearing grossly intact, nares w/o erythema or drainage Eyes: PER, EOMI, sclera nonicteric.  Neck: Supple, no masses.  No bruit or JVD.  Pulmonary:  Good air movement, no audible wheezing, no use of accessory muscles.   Cardiac: RRR, normal S1, S2, no Murmurs. Vascular:  carotid bruit noted Vessel Right Left  Radial Palpable Palpable  Carotid  Palpable  Palpable  Gastrointestinal: soft, non-distended. No guarding/no peritoneal signs.  Musculoskeletal: M/S 5/5 throughout.  No visible deformity.  Neurologic: CN 2-12 intact. Pain and light touch intact in extremities.  Symmetrical.  Speech is fluent. Motor exam as listed above. Psychiatric: Judgment intact, Mood & affect appropriate for pt's clinical situation. Dermatologic: No rashes or ulcers noted.  No changes consistent with cellulitis.   CBC Lab Results  Component Value Date   WBC 6.3 05/10/2024   HGB 8.9 (L) 05/10/2024   HCT 27.5 (L) 05/10/2024   MCV 98.6 05/10/2024   PLT 175 05/10/2024    BMET    Component Value Date/Time   NA 140 05/11/2024 0440   NA 145 (H) 09/08/2022 1245   NA 141 10/22/2011 0340   K 3.6 05/11/2024 0440   K 3.7 02/03/2012 1219   CL 107 05/11/2024 0440  CL 108 (H) 10/22/2011 0340   CO2 23 05/11/2024 0440   CO2 25 10/22/2011 0340   GLUCOSE 112 (H) 05/11/2024 0440   GLUCOSE 85 10/22/2011 0340   BUN 10 05/11/2024 0440   BUN 21 09/08/2022 1245   BUN 30 (H) 10/22/2011 0340   CREATININE 1.06 (H) 05/11/2024 0440   CREATININE 1.38 (H) 10/22/2011 0340   CALCIUM  8.9 05/11/2024 0440   CALCIUM  8.1 (L) 10/22/2011 9659   GFRNONAA 55 (L) 05/11/2024 0440   GFRNONAA 41 (L) 10/22/2011 0340   GFRAA >60 10/28/2018 0445   GFRAA 47 (L) 10/22/2011 0340   CrCl cannot be calculated (Patient's most recent lab result is older than the maximum 21 days allowed.).  COAG Lab Results  Component Value Date   INR 1.2 10/09/2022   INR 1.2 07/26/2022   INR 1.1 06/17/2021    Radiology DG Foot Complete Right Result Date: 06/21/2024 CLINICAL DATA:  Lateral right foot pain EXAM: DG FOOT COMPLETE 3+V*R* COMPARISON:  06/09/2024 FINDINGS: Frontal, oblique, and lateral views of the right foot are obtained. A distracted transverse  intra-articular fracture at the base of fifth metatarsal is again noted in stable position. No interval callus formation. No additional fractures. Mild diffuse osteoarthritis greatest throughout the midfoot. Diffuse soft tissue swelling, greatest within the dorsum of the midfoot and forefoot. IMPRESSION: 1. Minimally distracted intra-articular transverse fracture at the base of the fifth metatarsal, with no evidence of interval healing. 2. Diffuse soft tissue swelling. Electronically Signed   By: Ozell Daring M.D.   On: 06/21/2024 18:30   DG Ankle Complete Right Result Date: 06/09/2024 EXAM: 3 OR MORE VIEW(S) XRAY OF THE RIGHT ANKLE 06/09/2024 01:27:22 PM CLINICAL HISTORY: PAIN RIGHT ANKLE [phone]) COMPARISON: None available. FINDINGS: BONES AND JOINTS: Mildly displaced comminuted transverse fracture through the base of the fifth metatarsal. Plantar and dorsal calcaneal spurs. Degenerative changes of the midfoot. No malalignment. SOFT TISSUES: Soft tissue edema. IMPRESSION: 1. Mildly displaced comminuted transverse fracture through the base of the fifth metatarsal. 2. Soft tissue edema. Electronically signed by: Morgane Naveau MD 06/09/2024 02:31 PM EST RP Workstation: [id number]   DG Foot Complete Right Result Date: 06/09/2024 EXAM: 3 VIEW(S) XRAY OF THE RIGHT FOOT 06/09/2024 01:27:22 PM COMPARISON: None available. CLINICAL HISTORY: PAIN ([id number]) FINDINGS: BONES AND JOINTS: No acute fracture. No malalignment. Small calcaneal spurs. Mild midfoot degenerative change. SOFT TISSUES: Soft tissue edema of the ankle. IMPRESSION: 1. No acute osseous findings. 2. Soft tissue edema. Electronically signed by: Morgane Naveau MD 06/09/2024 02:27 PM EST RP Workstation: [id number]     Assessment/Plan 1. Internal carotid artery stenosis, bilateral (Primary) Recommend:   She is status post right carotid endarterectomy on 05/14/2023   Given the patient's asymptomatic subcritical stenosis no further invasive testing or  surgery at this time.   Duplex ultrasound shows RICA 40-59% and LICA 40-59% stenosis bilaterally.   Continue antiplatelet therapy as prescribed Continue management of CAD, HTN and Hyperlipidemia Healthy heart diet,  encouraged exercise at least 4 times per week   Follow up in 12 months with duplex ultrasound and physical exam  - VAS US  CAROTID; Future  2. CAD in native artery Continue cardiac and antihypertensive medications as already ordered and reviewed, no changes at this time.  Continue statin as ordered and reviewed, no changes at this time  Nitrates PRN for chest pain  3. Essential hypertension Continue antihypertensive medications as already ordered, these medications have been reviewed and there are no changes at this time.  4. Type  2 diabetes mellitus with diabetic polyneuropathy, without long-term current use of insulin  (HCC) Continue hypoglycemic medications as already ordered, these medications have been reviewed and there are no changes at this time.  Hgb A1C to be monitored as already arranged by primary service  5. Pure hypercholesterolemia Continue statin as ordered and reviewed, no changes at this time    Cordella Shawl, MD  06/25/2024 1:39 PM      [1]  No outpatient medications have been marked as taking for the 06/26/24 encounter (Appointment) with Shawl, Cordella MATSU, MD.  [2]  Social History Tobacco Use   Smoking status: Former    Current packs/day: 0.25    Average packs/day: 0.3 packs/day for 35.0 years (8.8 ttl pk-yrs)    Types: Cigarettes   Smokeless tobacco: Never  Vaping Use   Vaping status: Never Used  Substance Use Topics   Alcohol  use: Yes    Comment: rare   Drug use: No  [3]  Allergies Allergen Reactions   Tizanidine      headaches   Venlafaxine Other (See Comments)    intolerant   Codeine Nausea And Vomiting   Tramadol Nausea And Vomiting   "

## 2024-06-26 ENCOUNTER — Ambulatory Visit (INDEPENDENT_AMBULATORY_CARE_PROVIDER_SITE_OTHER)

## 2024-06-26 ENCOUNTER — Encounter (INDEPENDENT_AMBULATORY_CARE_PROVIDER_SITE_OTHER): Payer: Self-pay | Admitting: Vascular Surgery

## 2024-06-26 ENCOUNTER — Ambulatory Visit (INDEPENDENT_AMBULATORY_CARE_PROVIDER_SITE_OTHER): Admitting: Vascular Surgery

## 2024-06-26 VITALS — BP 115/68 | HR 74 | Resp 17 | Ht 63.0 in

## 2024-06-26 DIAGNOSIS — I6523 Occlusion and stenosis of bilateral carotid arteries: Secondary | ICD-10-CM

## 2024-06-26 DIAGNOSIS — E1142 Type 2 diabetes mellitus with diabetic polyneuropathy: Secondary | ICD-10-CM

## 2024-06-26 DIAGNOSIS — I2510 Atherosclerotic heart disease of native coronary artery without angina pectoris: Secondary | ICD-10-CM | POA: Diagnosis not present

## 2024-06-26 DIAGNOSIS — E7800 Pure hypercholesterolemia, unspecified: Secondary | ICD-10-CM

## 2024-06-26 DIAGNOSIS — I10 Essential (primary) hypertension: Secondary | ICD-10-CM

## 2024-07-06 ENCOUNTER — Encounter
Admission: RE | Disposition: A | Discharge status: Home / Self Care | Payer: Self-pay | Source: Home / Self Care | Attending: Gastroenterology

## 2024-07-06 ENCOUNTER — Encounter: Payer: Self-pay | Admitting: Gastroenterology

## 2024-07-06 ENCOUNTER — Ambulatory Visit: Admitting: Certified Registered"

## 2024-07-06 ENCOUNTER — Other Ambulatory Visit: Payer: Self-pay

## 2024-07-06 ENCOUNTER — Ambulatory Visit
Admission: RE | Admit: 2024-07-06 | Discharge: 2024-07-06 | Disposition: A | Discharge status: Home / Self Care | Attending: Gastroenterology | Admitting: Gastroenterology

## 2024-07-06 DIAGNOSIS — K219 Gastro-esophageal reflux disease without esophagitis: Secondary | ICD-10-CM | POA: Diagnosis not present

## 2024-07-06 DIAGNOSIS — Z87891 Personal history of nicotine dependence: Secondary | ICD-10-CM | POA: Insufficient documentation

## 2024-07-06 DIAGNOSIS — E66813 Obesity, class 3: Secondary | ICD-10-CM | POA: Diagnosis not present

## 2024-07-06 DIAGNOSIS — J449 Chronic obstructive pulmonary disease, unspecified: Secondary | ICD-10-CM | POA: Diagnosis not present

## 2024-07-06 DIAGNOSIS — K529 Noninfective gastroenteritis and colitis, unspecified: Secondary | ICD-10-CM | POA: Diagnosis not present

## 2024-07-06 DIAGNOSIS — Z6831 Body mass index (BMI) 31.0-31.9, adult: Secondary | ICD-10-CM | POA: Diagnosis not present

## 2024-07-06 DIAGNOSIS — Z980 Intestinal bypass and anastomosis status: Secondary | ICD-10-CM | POA: Insufficient documentation

## 2024-07-06 DIAGNOSIS — N183 Chronic kidney disease, stage 3 unspecified: Secondary | ICD-10-CM | POA: Diagnosis not present

## 2024-07-06 DIAGNOSIS — I129 Hypertensive chronic kidney disease with stage 1 through stage 4 chronic kidney disease, or unspecified chronic kidney disease: Secondary | ICD-10-CM | POA: Insufficient documentation

## 2024-07-06 DIAGNOSIS — Z951 Presence of aortocoronary bypass graft: Secondary | ICD-10-CM | POA: Diagnosis not present

## 2024-07-06 DIAGNOSIS — K56699 Other intestinal obstruction unspecified as to partial versus complete obstruction: Secondary | ICD-10-CM | POA: Insufficient documentation

## 2024-07-06 DIAGNOSIS — Z952 Presence of prosthetic heart valve: Secondary | ICD-10-CM | POA: Insufficient documentation

## 2024-07-06 DIAGNOSIS — D509 Iron deficiency anemia, unspecified: Secondary | ICD-10-CM | POA: Diagnosis present

## 2024-07-06 DIAGNOSIS — I2510 Atherosclerotic heart disease of native coronary artery without angina pectoris: Secondary | ICD-10-CM | POA: Insufficient documentation

## 2024-07-06 DIAGNOSIS — E1122 Type 2 diabetes mellitus with diabetic chronic kidney disease: Secondary | ICD-10-CM | POA: Insufficient documentation

## 2024-07-06 DIAGNOSIS — M1990 Unspecified osteoarthritis, unspecified site: Secondary | ICD-10-CM | POA: Insufficient documentation

## 2024-07-06 LAB — GLUCOSE, CAPILLARY: Glucose-Capillary: 99 mg/dL (ref 70–99)

## 2024-07-06 MED ORDER — PROPOFOL 10 MG/ML IV BOLUS
INTRAVENOUS | Status: DC | PRN
  Administered 2024-07-06: 70 mg via INTRAVENOUS
  Administered 2024-07-06: 30 mg via INTRAVENOUS

## 2024-07-06 MED ORDER — SPOT INK MARKER SYRINGE KIT
PACK | SUBMUCOSAL | Status: DC | PRN
  Administered 2024-07-06: 2 mL via SUBMUCOSAL

## 2024-07-06 MED ORDER — LIDOCAINE 2% (20 MG/ML) 5 ML SYRINGE
INTRAMUSCULAR | Status: DC | PRN
  Administered 2024-07-06: 20 mg via INTRAVENOUS

## 2024-07-06 MED ORDER — PROPOFOL 500 MG/50ML IV EMUL
INTRAVENOUS | Status: DC | PRN
  Administered 2024-07-06: 120 ug/kg/min via INTRAVENOUS

## 2024-07-06 MED ORDER — SODIUM CHLORIDE 0.9 % IV SOLN: INTRAVENOUS | Status: DC

## 2024-07-06 NOTE — Transfer of Care (Signed)
 Immediate Anesthesia Transfer of Care Note  Patient: Renee [name]  Procedure(s) Performed: COLONOSCOPY BIOPSY GI  Patient Location: Endoscopy Unit  Anesthesia Type:General  Level of Consciousness: drowsy  Airway & Oxygen Therapy: Patient Spontanous Breathing  Post-op Assessment: Report given to RN and Post -op Vital signs reviewed and stable  Post vital signs: Reviewed  Last Vitals:  Vitals Value Taken Time  BP 105/63 07/06/24 09:26  Temp    Pulse 78 07/06/24 09:27  Resp 20 07/06/24 09:27  SpO2 100 % 07/06/24 09:27    Last Pain:  Vitals:   07/06/24 0809  TempSrc: Tympanic  PainSc: 0-No pain         Complications: No notable events documented.

## 2024-07-06 NOTE — Anesthesia Preprocedure Evaluation (Addendum)
 "                                  Anesthesia Evaluation  Patient identified by MRN, date of birth, ID band Patient awake    Reviewed: Allergy & Precautions, NPO status , Patient's Chart, lab work & pertinent test results  Airway Mallampati: III  TM Distance: >3 FB Neck ROM: Full    Dental  (+) Upper Dentures, Partial Lower   Pulmonary COPD, Patient abstained from smoking., former smoker   Pulmonary exam normal  + decreased breath sounds      Cardiovascular Exercise Tolerance: Good hypertension, Pt. on medications + CAD  Normal cardiovascular exam Rhythm:Regular Rate:Normal  S/p aortic valve replacement 2024   Neuro/Psych TIAnegative neurological ROS  negative psych ROS   GI/Hepatic negative GI ROS, Neg liver ROS,GERD  Medicated,,  Endo/Other  diabetes, Type 2  Class 3 obesity  Renal/GU CRFRenal disease  negative genitourinary   Musculoskeletal  (+) Arthritis ,    Abdominal  (+) + obese  Peds negative pediatric ROS (+)  Hematology negative hematology ROS (+) Blood dyscrasia, anemia   Anesthesia Other Findings Past Medical History: No date: Anemia No date: Arthritis No date: CKD (chronic kidney disease) stage 3, GFR 30-59 ml/min (HCC) No date: Colonic mass No date: Coronary artery disease No date: Diverticulitis No date: DM (diabetes mellitus), type 2 (HCC) No date: GERD (gastroesophageal reflux disease)     Comment:  no meds No date: Heart murmur No date: Hepatic steatosis No date: Hyperlipidemia No date: Hypertension No date: Neuropathy No date: 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 Wonda & Dr. Lucas No date: Severe aortic stenosis  Past Surgical History: No date: ABDOMINAL HYSTERECTOMY 20+ yrs ago: BREAST EXCISIONAL BIOPSY; Right     Comment:  benign No date: CARDIAC SURGERY     Comment:  cabg No date:  CARPAL TUNNEL RELEASE; Bilateral 02/24/2017: COLONOSCOPY; N/A     Comment:  Procedure: COLONOSCOPY;  Surgeon: Toledo, [name], MD;              Location: [facility] ENDOSCOPY;  Service: Endoscopy;                Laterality: N/A; 07/28/2022: COLONOSCOPY; N/A     Comment:  Procedure: COLONOSCOPY;  Surgeon: Toledo, [name], MD;              Location: [facility] ENDOSCOPY;  Service: Gastroenterology;                Laterality: N/A; 03/21/2024: COLONOSCOPY; N/A     Comment:  Procedure: COLONOSCOPY;  Surgeon: Therisa Bi, MD;                Location: [facility] ENDOSCOPY;  Service: Gastroenterology;                Laterality: N/A;  DM   Plavix  No date: CORONARY ARTERY BYPASS GRAFT     Comment:  1996 09/15/2022: CORONARY/GRAFT ANGIOGRAPHY; N/A     Comment:  Procedure: CORONARY/GRAFT ANGIOGRAPHY;  Surgeon: [name], MD;  Location: [facility] INVASIVE CV LAB;  Service:  Cardiovascular;  Laterality: N/A; 05/14/2023: ENDARTERECTOMY; Right     Comment:  Procedure: ENDARTERECTOMY CAROTID;  Surgeon: [name], MD;  Location: [facility] ORS;  Service: Vascular;                Laterality: Right; 03/21/2024: ESOPHAGOGASTRODUODENOSCOPY; N/A     Comment:  Procedure: EGD (ESOPHAGOGASTRODUODENOSCOPY);  Surgeon:               Therisa Bi, MD;  Location: [facility] ENDOSCOPY;  Service:               Gastroenterology;  Laterality: N/A; 07/27/2022: ESOPHAGOGASTRODUODENOSCOPY (EGD) WITH PROPOFOL ; N/A     Comment:  Procedure: ESOPHAGOGASTRODUODENOSCOPY (EGD) WITH               PROPOFOL ;  Surgeon: Toledo, [name], MD;  Location:               [facility] ENDOSCOPY;  Service: Gastroenterology;  Laterality:               N/A; No date: HERNIA REPAIR     Comment:  umbilical 10/13/2022: INTRAOPERATIVE TRANSTHORACIC ECHOCARDIOGRAM; N/A     Comment:  Procedure: INTRAOPERATIVE TRANSTHORACIC ECHOCARDIOGRAM;               Surgeon: [name], MD;  Location[facility] INVASIVE CV               LAB;  Service:  Open Heart Surgery;  Laterality: N/A; 06/25/2021: LUMBAR LAMINECTOMY/DECOMPRESSION MICRODISCECTOMY; N/A     Comment:  Procedure: L2-3 DECOMPRESSION;  Surgeon: [name], MD;  Location: [facility] ORS;  Service: Neurosurgery;              Laterality: N/A; 10/25/2018: TOTAL KNEE ARTHROPLASTY; Left     Comment:  Procedure: TOTAL KNEE ARTHROPLASTY - LEFT - DIABETIC;                Surgeon: [name], MD;  Location: [facility] ORS;                Service: Orthopedics;  Laterality: Left; 10/13/2022: TRANSCATHETER AORTIC VALVE REPLACEMENT, TRANSFEMORAL; Right     Comment:  Procedure: Transcatheter Aortic Valve Replacement,               Transfemoral;  Surgeon: [name], MD;  Location:               [facility] INVASIVE CV LAB;  Service: Open Heart Surgery;                Laterality: Right;  BMI    Body Mass Index: 31.53 kg/m      Reproductive/Obstetrics negative OB ROS                              Anesthesia Physical Anesthesia Plan  ASA: 3  Anesthesia Plan: General   Post-op Pain Management:    Induction: Intravenous  PONV Risk Score and Plan: Propofol  infusion and TIVA  Airway Management Planned: Natural Airway and Nasal Cannula  Additional Equipment:   Intra-op Plan:   Post-operative Plan:   Informed Consent: I have reviewed the patients History and Physical, chart, labs and discussed the procedure including the risks, benefits and alternatives for the proposed anesthesia with the patient or authorized representative who has indicated his/her understanding and acceptance.  Dental Advisory Given  Plan Discussed with: CRNA  Anesthesia Plan Comments:          Anesthesia Quick Evaluation  "

## 2024-07-06 NOTE — H&P (Signed)
 "                                                                                                                           [name] , MD [address], [place], [place], [address] Phone: [phone] Fax: [phone]  Primary Care Physician:  Renee Ophelia JINNY DOUGLAS, MD   Pre-Procedure History & Physical: HPI:  Renee [name] is a 74 y.o. female is here for an colonoscopy.   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 Wonda & Dr. Lucas   Severe aortic stenosis     Past Surgical History:  Procedure Laterality Date   ABDOMINAL HYSTERECTOMY     BREAST EXCISIONAL BIOPSY Right 20+ yrs ago   benign   CARDIAC SURGERY     cabg   CARPAL TUNNEL RELEASE Bilateral    COLONOSCOPY N/A 02/24/2017   Procedure: COLONOSCOPY;  Surgeon: Toledo, Renee POUR, MD;  Location: [facility] ENDOSCOPY;  Service: Endoscopy;  Laterality: N/A;   COLONOSCOPY N/A 07/28/2022   Procedure: COLONOSCOPY;  Surgeon: Toledo, Renee POUR, MD;  Location: [facility] ENDOSCOPY;  Service: Gastroenterology;  Laterality: N/A;   COLONOSCOPY N/A 03/21/2024   Procedure: COLONOSCOPY;  Surgeon: Kung Ruel, MD;  Location: [facility] ENDOSCOPY;  Service: Gastroenterology;  Laterality: N/A;  DM   Plavix    CORONARY ARTERY BYPASS GRAFT     1996   CORONARY/GRAFT ANGIOGRAPHY N/A 09/15/2022   Procedure: CORONARY/GRAFT ANGIOGRAPHY;  Surgeon: Renee Sharper, MD;  Location: [facility] INVASIVE CV LAB;  Service: Cardiovascular;  Laterality: N/A;   ENDARTERECTOMY Right 05/14/2023   Procedure:  ENDARTERECTOMY CAROTID;  Surgeon: Renee Cordella MATSU, MD;  Location: [facility] ORS;  Service: Vascular;  Laterality: Right;   ESOPHAGOGASTRODUODENOSCOPY N/A 03/21/2024   Procedure: EGD (ESOPHAGOGASTRODUODENOSCOPY);  Surgeon: Kung Ruel, MD;  Location: [facility] ENDOSCOPY;  Service: Gastroenterology;  Laterality: N/A;   ESOPHAGOGASTRODUODENOSCOPY (EGD) WITH PROPOFOL  N/A 07/27/2022   Procedure: ESOPHAGOGASTRODUODENOSCOPY (EGD) WITH PROPOFOL ;  Surgeon: Toledo, Renee POUR, MD;  Location: [facility] ENDOSCOPY;  Service: Gastroenterology;  Laterality: N/A;   HERNIA REPAIR     umbilical   INTRAOPERATIVE TRANSTHORACIC ECHOCARDIOGRAM N/A 10/13/2022   Procedure: INTRAOPERATIVE TRANSTHORACIC ECHOCARDIOGRAM;  Surgeon: Renee Sharper, MD;  Location: [facility] INVASIVE CV LAB;  Service: Open Heart Surgery;  Laterality: N/A;   LUMBAR LAMINECTOMY/DECOMPRESSION MICRODISCECTOMY N/A 06/25/2021   Procedure: L2-3 DECOMPRESSION;  Surgeon:  Renee Fret, MD;  Location: [facility] ORS;  Service: Neurosurgery;  Laterality: N/A;   TOTAL KNEE ARTHROPLASTY Left 10/25/2018   Procedure: TOTAL KNEE ARTHROPLASTY - LEFT - DIABETIC;  Surgeon: Renee Sharper, MD;  Location: [facility] ORS;  Service: Orthopedics;  Laterality: Left;   TRANSCATHETER AORTIC VALVE REPLACEMENT, TRANSFEMORAL Right 10/13/2022   Procedure: Transcatheter Aortic Valve Replacement, Transfemoral;  Surgeon: Renee Sharper, MD;  Location: [facility] INVASIVE CV LAB;  Service: Open Heart Surgery;  Laterality: Right;    Prior to Admission medications  Medication Sig Start Date End Date Taking? Authorizing Provider  amLODipine  (NORVASC ) 5 MG tablet Take 5 mg by mouth every morning.   Yes [provider]  carvedilol  (COREG ) 6.25 MG tablet Take 6.25 mg by mouth 2 (two) times a day.    Yes [provider]  rosuvastatin  (CRESTOR ) 40 MG tablet Take 40 mg by mouth at bedtime.   Yes [provider]  acetaminophen  (TYLENOL ) 500 MG tablet Take 500 mg by mouth every 6 (six) hours as needed  for headache or fever.    [provider]  Alpha-Lipoic Acid 600 MG CAPS Take 600 mg by mouth daily.    [provider]  amoxicillin  (AMOXIL ) 500 MG tablet Take 4 tablets (2,000 mg total) by mouth as directed. 1 hour prior to dental work including cleanings Patient not taking: Reported on 06/26/2024 10/23/22   Renee Lamarr SAUNDERS, PA-C  aspirin  EC 81 MG tablet Take 81 mg by mouth every other day.    [provider]  Cholecalciferol  (VITAMIN D3) 50 MCG (2000 UT) TABS Take 2,000 Units by mouth daily.    [provider]  CINNAMON  PO Take 1,000 mg by mouth daily.    [provider]  clopidogrel  (PLAVIX ) 75 MG tablet Take 1 tablet (75 mg total) by mouth daily at 6 (six) AM. 05/16/23   Renee Durand, MD  EPINEPHrine  0.3 mg/0.3 mL IJ SOAJ injection Inject 0.3 mg into the muscle as needed for anaphylaxis. 05/04/22   [provider]  furosemide  (LASIX ) 20 MG tablet Take 20 mg by mouth once a week. In the morning    [provider]  gabapentin  (NEURONTIN ) 800 MG tablet Take 800-1,600 mg by mouth See admin instructions. Take 1 tablet (800 mg) by mouth in the morning, 1 tablet (800 mg) by mouth at noon, & take 2 tablets (1600 mg) by mouth at bedtime.    [provider]  hydroxychloroquine (PLAQUENIL) 200 MG tablet  06/13/24   [provider]  loratadine  (CLARITIN ) 10 MG tablet Take 10 mg by mouth daily as needed for allergies.    [provider]  Magnesium  250 MG CAPS Take 250 mg by mouth daily.    [provider]  nitroGLYCERIN  (NITROSTAT ) 0.4 MG SL tablet Place 0.4 mg under the tongue every 5 (five) minutes as needed for chest pain.    [provider]  Omega-3 Fatty Acids (FISH OIL) 1000 MG CAPS Take 1,000 mg by mouth 3 (three) times daily.    [provider]  pantoprazole  (PROTONIX ) 40 MG tablet Take 40 mg by mouth 2 (two) times daily. 03/04/24   [provider]  potassium chloride  SA  (KLOR-CON  M) 20 MEQ tablet Take 20 mEq by mouth daily. 06/22/22   [provider]    Allergies as of 06/16/2024 - Review Complete 05/08/2024  Allergen Reaction Noted   Tizanidine   10/08/2022   Venlafaxine Other (See Comments) 07/02/2020   Codeine Nausea And Vomiting 05/23/2016  Tramadol Nausea And Vomiting 05/23/2016    Family History  Problem Relation Age of Onset   Breast cancer Neg Hx     Social History   Socioeconomic History   Marital status: Widowed    Spouse name: Not on file   Number of children: Not on file   Years of education: Not on file   Highest education level: Not on file  Occupational History   Not on file  Tobacco Use   Smoking status: Former    Current packs/day: 0.25    Average packs/day: 0.3 packs/day for 35.0 years (8.8 ttl pk-yrs)    Types: Cigarettes   Smokeless tobacco: Never  Vaping Use   Vaping status: Never Used  Substance and Sexual Activity   Alcohol  use: Yes    Comment: rare   Drug use: No   Sexual activity: Not on file  Other Topics Concern   Not on file  Social History Narrative   Son lives with her. Spouse died 05/23/19   Social Drivers of Health   Tobacco Use: Medium Risk (06/26/2024)   Patient History    Smoking Tobacco Use: Former    Smokeless Tobacco Use: Never    Passive Exposure: Not on file  Financial Resource Strain: Low Risk  (04/26/2024)   Received from [facility]   Overall Financial Resource Strain (CARDIA)    Difficulty of Paying Living Expenses: Not hard at all  Food Insecurity: No Food Insecurity (05/09/2024)   Epic    Worried About Radiation Protection Practitioner of Food in the Last Year: Never true    Ran Out of Food in the Last Year: Never true  Transportation Needs: No Transportation Needs (05/09/2024)   Epic    Lack of Transportation (Medical): No    Lack of Transportation (Non-Medical): No  Physical Activity: Not on file  Stress: Not on file  Social Connections: Unknown (05/09/2024)   Social  Connection and Isolation Panel    Frequency of Communication with Friends and Family: More than three times a week    Frequency of Social Gatherings with Friends and Family: More than three times a week    Attends Religious Services: 1 to 4 times per year    Active Member of [employer] or Organizations: No    Attends Banker Meetings: Never    Marital Status: Patient declined  Catering Manager Violence: Not At Risk (05/09/2024)   Epic    Fear of Current or Ex-Partner: No    Emotionally Abused: No    Physically Abused: No    Sexually Abused: No  Depression (PHQ2-9): Not on file  Alcohol  Screen: Not on file  Housing: Low Risk  (05/19/2024)   Received from [facility]   Epic    In the last 12 months, was there a time when you were not able to pay the mortgage or rent on time?: No    In the past 12 months, how many times have you moved where you were living?: 0    At any time in the past 12 months, were you homeless or living in a shelter (including now)?: No  Utilities: Not At Risk (05/09/2024)   Epic    Threatened with loss of utilities: No  Health Literacy: Not on file    Review of Systems: See HPI, otherwise negative ROS  Physical Exam: BP 137/68   Pulse 79   Temp (!) 97 F (36.1 C) (Tympanic)   Resp 18   Ht 5'  3 (1.6 m)   Wt 80.7 kg   SpO2 100%   BMI 31.53 kg/m  General:   Alert,  pleasant and cooperative in NAD Head:  Normocephalic and atraumatic. Neck:  Supple; no masses or thyromegaly. Lungs:  Clear throughout to auscultation, normal respiratory effort.    Heart:  +S1, +S2, Regular rate and rhythm, No edema. Abdomen:  Soft, nontender and nondistended. Normal bowel sounds, without guarding, and without rebound.   Neurologic:  Alert and  oriented x4;  grossly normal neurologically.  Impression/Plan: KHRISTEN CHEYNEY is here for an colonoscopy to be performed for iron  deficiency anemia  Risks, benefits, limitations, and alternatives regarding   colonoscopy have been reviewed with the patient.  Questions have been answered.  All parties agreeable.   [name], MD  07/06/2024, 8:15 AM  "

## 2024-07-06 NOTE — Anesthesia Postprocedure Evaluation (Signed)
"   Anesthesia Post Note  Patient: Renee [name]  Procedure(s) Performed: COLONOSCOPY BIOPSY GI CONTROL OF HEMORRHAGE, GI TRACT, ENDOSCOPIC, BY CLIPPING OR OVERSEWING  Patient location during evaluation: PACU Anesthesia Type: General Level of consciousness: awake and awake and alert Pain management: satisfactory to patient Vital Signs Assessment: post-procedure vital signs reviewed and stable Respiratory status: spontaneous breathing Cardiovascular status: stable Anesthetic complications: no   No notable events documented.   Last Vitals:  Vitals:   07/06/24 0936 07/06/24 0948  BP: 125/75 (!) 147/85  Pulse: 71 74  Resp: 20 18  Temp:    SpO2: 99% 100%    Last Pain:  Vitals:   07/06/24 0936  TempSrc:   PainSc: 0-No pain                 VAN STAVEREN,Cesilia Shinn      "

## 2024-07-06 NOTE — Op Note (Addendum)
 [facility] Gastroenterology Patient Name: Renee [name] Procedure Date: 07/06/2024 8:53 AM MRN: [id number] Account #: [id number] Date of Birth: Jul 10, 1950 Admit Type: Outpatient Age: 74 Room: [facility] ENDO ROOM 2 Gender: Female Note Status: Supervisor Override Instrument Name: Colon Scope [id number] Procedure:             Colonoscopy Indications:           Iron  deficiency anemia, abnormal colonoscopy Providers:             [name] MD, MD Referring MD:          [name] MD, MD (Referring MD), Ophelia Sage, MD                         (Referring MD) Medicines:             Monitored Anesthesia Care Complications:         No immediate complications. Procedure:             Pre-Anesthesia Assessment:                        - Prior to the procedure, a History and Physical was                         performed, and patient medications, allergies and                         sensitivities were reviewed. The patient's tolerance                         of previous anesthesia was reviewed.                        - The risks and benefits of the procedure and the                         sedation options and risks were discussed with the                         patient. All questions were answered and informed                         consent was obtained.                        - ASA Grade Assessment: II - A patient with mild                         systemic disease.                        After obtaining informed consent, the colonoscope was                         passed under direct vision. Throughout the procedure,                         the patient's blood pressure, pulse, and oxygen  saturations were monitored continuously. The                         Colonoscope was introduced through the anus and                         advanced to the the ileocolonic anastomosis. The                         colonoscopy was performed with ease. The patient                          tolerated the procedure well. The quality of the bowel                         preparation was good. no anatomical landmarks were                         photographed. Scope wa advanced till stricture Findings:      The perianal and digital rectal examinations were normal.      A benign-appearing, intrinsic severe stenosis measuring 8 mm (inner       diameter) was found in the transverse colon and was non-traversed. Area       was tattooed with an injection of Spot (carbon black). Biopsies were       taken with a cold forceps for histology. I couldnt go past the stenosis       but could see colonic mucosa through the striture . Tatto placed distal       to the stricture . A clip as also placed to provide radiology a marker      The exam was otherwise without abnormality on direct and retroflexion       views. 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 Tattooed.                         Biopsied.                        - The examination was otherwise normal on direct and                         retroflexion views. Recommendation:        - Discharge patient to home (with escort).                        - Resume previous diet.                        - Continue present medications.                        - Consult Dr Tye due to persitence of a stricture -                         unsure of etiology bx taken Procedure Code(s):     --- Professional ---  54619, Colonoscopy, flexible; with biopsy, single or                         multiple                        45381, Colonoscopy, flexible; with directed submucosal                         injection(s), any substance Diagnosis Code(s):     --- Professional ---                        K56.699, Other intestinal obstruction unspecified as                         to partial versus complete obstruction                        D50.9, Iron  deficiency anemia, unspecified CPT copyright 2022 American  Medical Association. All rights reserved. The codes documented in this report are preliminary and upon coder review may  be revised to meet current compliance requirements. [name], MD [name] MD, MD 07/06/2024 9:27:22 AM This report has been signed electronically. Number of Addenda: 0 Note Initiated On: 07/06/2024 8:53 AM Total Procedure Duration: 0 hours 19 minutes 52 seconds  Estimated Blood Loss:  Estimated blood loss: none.      [facility]

## 2024-07-07 LAB — SURGICAL PATHOLOGY

## 2024-07-11 ENCOUNTER — Ambulatory Visit

## 2024-07-13 ENCOUNTER — Ambulatory Visit: Payer: Self-pay | Admitting: Gastroenterology

## 2024-07-20 ENCOUNTER — Ambulatory Visit

## 2025-06-25 ENCOUNTER — Ambulatory Visit (INDEPENDENT_AMBULATORY_CARE_PROVIDER_SITE_OTHER): Admitting: Vascular Surgery

## 2025-06-25 ENCOUNTER — Encounter (INDEPENDENT_AMBULATORY_CARE_PROVIDER_SITE_OTHER)
